# Patient Record
Sex: Female | Born: 1935
Health system: Southern US, Community
[De-identification: ages and names within clinical notes are randomized; demographics above are authoritative.]

## PROBLEM LIST (undated history)

## (undated) DIAGNOSIS — I4719 Other supraventricular tachycardia: Secondary | ICD-10-CM

## (undated) DIAGNOSIS — E049 Nontoxic goiter, unspecified: Secondary | ICD-10-CM

## (undated) DIAGNOSIS — M316 Other giant cell arteritis: Secondary | ICD-10-CM

## (undated) DIAGNOSIS — M1990 Unspecified osteoarthritis, unspecified site: Secondary | ICD-10-CM

## (undated) DIAGNOSIS — H409 Unspecified glaucoma: Secondary | ICD-10-CM

## (undated) DIAGNOSIS — E785 Hyperlipidemia, unspecified: Secondary | ICD-10-CM

## (undated) DIAGNOSIS — N3281 Overactive bladder: Secondary | ICD-10-CM

## (undated) DIAGNOSIS — M792 Neuralgia and neuritis, unspecified: Secondary | ICD-10-CM

## (undated) DIAGNOSIS — K219 Gastro-esophageal reflux disease without esophagitis: Secondary | ICD-10-CM

## (undated) DIAGNOSIS — I471 Supraventricular tachycardia: Secondary | ICD-10-CM

## (undated) DIAGNOSIS — R51 Headache: Secondary | ICD-10-CM

## (undated) DIAGNOSIS — I719 Aortic aneurysm of unspecified site, without rupture: Secondary | ICD-10-CM

## (undated) DIAGNOSIS — G2581 Restless legs syndrome: Secondary | ICD-10-CM

## (undated) HISTORY — PX: OTHER SURGICAL HISTORY: SHX169

## (undated) HISTORY — DX: Hyperlipidemia, unspecified: E78.5

## (undated) HISTORY — DX: Neuralgia and neuritis, unspecified: M79.2

## (undated) HISTORY — DX: Restless legs syndrome: G25.81

## (undated) HISTORY — DX: Aortic aneurysm of unspecified site, without rupture: I71.9

## (undated) HISTORY — DX: Other giant cell arteritis: M31.6

## (undated) HISTORY — PX: EYE SURGERY: SHX253

## (undated) HISTORY — PX: INCONTINENCE SURGERY: SHX676

## (undated) HISTORY — PX: JOINT REPLACEMENT: SHX530

## (undated) HISTORY — DX: Unspecified glaucoma: H40.9

---

## 1997-10-19 ENCOUNTER — Other Ambulatory Visit: Admission: RE | Admit: 1997-10-19 | Discharge: 1997-10-19 | Payer: Self-pay | Admitting: Obstetrics & Gynecology

## 1998-07-04 ENCOUNTER — Ambulatory Visit (HOSPITAL_COMMUNITY): Admission: AD | Admit: 1998-07-04 | Discharge: 1998-07-04 | Payer: Self-pay | Admitting: Obstetrics & Gynecology

## 1998-12-12 ENCOUNTER — Other Ambulatory Visit: Admission: RE | Admit: 1998-12-12 | Discharge: 1998-12-12 | Payer: Self-pay | Admitting: Obstetrics and Gynecology

## 1999-01-10 ENCOUNTER — Encounter: Payer: Self-pay | Admitting: Obstetrics and Gynecology

## 1999-01-10 ENCOUNTER — Encounter: Admission: RE | Admit: 1999-01-10 | Discharge: 1999-01-10 | Payer: Self-pay | Admitting: Obstetrics and Gynecology

## 1999-09-15 ENCOUNTER — Encounter: Admission: RE | Admit: 1999-09-15 | Discharge: 1999-09-15 | Payer: Self-pay | Admitting: Orthopedic Surgery

## 1999-09-15 ENCOUNTER — Encounter: Payer: Self-pay | Admitting: Orthopedic Surgery

## 1999-12-14 ENCOUNTER — Other Ambulatory Visit: Admission: RE | Admit: 1999-12-14 | Discharge: 1999-12-14 | Payer: Self-pay | Admitting: Obstetrics and Gynecology

## 1999-12-19 ENCOUNTER — Encounter: Payer: Self-pay | Admitting: Obstetrics and Gynecology

## 1999-12-19 ENCOUNTER — Encounter: Admission: RE | Admit: 1999-12-19 | Discharge: 1999-12-19 | Payer: Self-pay | Admitting: Obstetrics and Gynecology

## 2000-03-13 ENCOUNTER — Encounter: Payer: Self-pay | Admitting: Obstetrics and Gynecology

## 2000-03-13 ENCOUNTER — Encounter: Admission: RE | Admit: 2000-03-13 | Discharge: 2000-03-13 | Payer: Self-pay | Admitting: Obstetrics and Gynecology

## 2000-10-09 ENCOUNTER — Encounter (INDEPENDENT_AMBULATORY_CARE_PROVIDER_SITE_OTHER): Payer: Self-pay | Admitting: *Deleted

## 2000-10-09 ENCOUNTER — Ambulatory Visit (HOSPITAL_COMMUNITY): Admission: RE | Admit: 2000-10-09 | Discharge: 2000-10-09 | Payer: Self-pay | Admitting: Gastroenterology

## 2000-12-18 ENCOUNTER — Other Ambulatory Visit: Admission: RE | Admit: 2000-12-18 | Discharge: 2000-12-18 | Payer: Self-pay | Admitting: Obstetrics and Gynecology

## 2001-01-01 ENCOUNTER — Encounter: Payer: Self-pay | Admitting: Obstetrics and Gynecology

## 2001-01-01 ENCOUNTER — Encounter: Admission: RE | Admit: 2001-01-01 | Discharge: 2001-01-01 | Payer: Self-pay | Admitting: Obstetrics and Gynecology

## 2001-04-15 ENCOUNTER — Encounter: Payer: Self-pay | Admitting: Obstetrics and Gynecology

## 2001-04-15 ENCOUNTER — Encounter: Admission: RE | Admit: 2001-04-15 | Discharge: 2001-04-15 | Payer: Self-pay | Admitting: Obstetrics and Gynecology

## 2001-12-22 ENCOUNTER — Other Ambulatory Visit: Admission: RE | Admit: 2001-12-22 | Discharge: 2001-12-22 | Payer: Self-pay | Admitting: Obstetrics and Gynecology

## 2002-01-02 ENCOUNTER — Encounter: Admission: RE | Admit: 2002-01-02 | Discharge: 2002-01-02 | Payer: Self-pay | Admitting: Obstetrics and Gynecology

## 2002-01-02 ENCOUNTER — Encounter: Payer: Self-pay | Admitting: Obstetrics and Gynecology

## 2002-05-07 ENCOUNTER — Encounter: Payer: Self-pay | Admitting: Obstetrics and Gynecology

## 2002-05-07 ENCOUNTER — Encounter: Admission: RE | Admit: 2002-05-07 | Discharge: 2002-05-07 | Payer: Self-pay | Admitting: Obstetrics and Gynecology

## 2002-08-20 ENCOUNTER — Encounter: Admission: RE | Admit: 2002-08-20 | Discharge: 2002-08-20 | Payer: Self-pay | Admitting: Obstetrics and Gynecology

## 2002-08-20 ENCOUNTER — Encounter: Payer: Self-pay | Admitting: Obstetrics and Gynecology

## 2003-02-19 ENCOUNTER — Other Ambulatory Visit: Admission: RE | Admit: 2003-02-19 | Discharge: 2003-02-19 | Payer: Self-pay | Admitting: Obstetrics and Gynecology

## 2003-05-12 ENCOUNTER — Encounter: Admission: RE | Admit: 2003-05-12 | Discharge: 2003-05-12 | Payer: Self-pay | Admitting: Obstetrics and Gynecology

## 2004-02-21 ENCOUNTER — Other Ambulatory Visit: Admission: RE | Admit: 2004-02-21 | Discharge: 2004-02-21 | Payer: Self-pay | Admitting: Obstetrics and Gynecology

## 2004-05-15 ENCOUNTER — Encounter: Admission: RE | Admit: 2004-05-15 | Discharge: 2004-05-15 | Payer: Self-pay | Admitting: Obstetrics and Gynecology

## 2004-08-07 ENCOUNTER — Observation Stay (HOSPITAL_COMMUNITY): Admission: RE | Admit: 2004-08-07 | Discharge: 2004-08-07 | Payer: Self-pay | Admitting: Orthopedic Surgery

## 2004-12-25 ENCOUNTER — Encounter: Admission: RE | Admit: 2004-12-25 | Discharge: 2004-12-25 | Payer: Self-pay | Admitting: Obstetrics and Gynecology

## 2005-05-21 ENCOUNTER — Encounter: Admission: RE | Admit: 2005-05-21 | Discharge: 2005-05-21 | Payer: Self-pay | Admitting: Obstetrics and Gynecology

## 2005-09-11 ENCOUNTER — Other Ambulatory Visit: Admission: RE | Admit: 2005-09-11 | Discharge: 2005-09-11 | Payer: Self-pay | Admitting: Obstetrics and Gynecology

## 2005-09-19 ENCOUNTER — Encounter: Admission: RE | Admit: 2005-09-19 | Discharge: 2005-09-19 | Payer: Self-pay | Admitting: Obstetrics and Gynecology

## 2006-05-17 ENCOUNTER — Ambulatory Visit (HOSPITAL_COMMUNITY): Admission: RE | Admit: 2006-05-17 | Discharge: 2006-05-17 | Payer: Self-pay | Admitting: *Deleted

## 2006-06-06 ENCOUNTER — Encounter: Admission: RE | Admit: 2006-06-06 | Discharge: 2006-06-06 | Payer: Self-pay | Admitting: Obstetrics and Gynecology

## 2006-10-02 ENCOUNTER — Other Ambulatory Visit: Admission: RE | Admit: 2006-10-02 | Discharge: 2006-10-02 | Payer: Self-pay | Admitting: Obstetrics and Gynecology

## 2006-10-21 ENCOUNTER — Encounter: Admission: RE | Admit: 2006-10-21 | Discharge: 2006-10-21 | Payer: Self-pay | Admitting: Obstetrics and Gynecology

## 2007-06-02 ENCOUNTER — Encounter: Admission: RE | Admit: 2007-06-02 | Discharge: 2007-06-02 | Payer: Self-pay | Admitting: Obstetrics and Gynecology

## 2007-11-17 ENCOUNTER — Other Ambulatory Visit: Admission: RE | Admit: 2007-11-17 | Discharge: 2007-11-17 | Payer: Self-pay | Admitting: Obstetrics and Gynecology

## 2007-11-25 ENCOUNTER — Encounter: Admission: RE | Admit: 2007-11-25 | Discharge: 2007-11-25 | Payer: Self-pay | Admitting: Obstetrics and Gynecology

## 2008-09-30 ENCOUNTER — Ambulatory Visit (HOSPITAL_COMMUNITY): Admission: RE | Admit: 2008-09-30 | Discharge: 2008-09-30 | Payer: Self-pay | Admitting: Obstetrics and Gynecology

## 2009-06-23 ENCOUNTER — Encounter: Admission: RE | Admit: 2009-06-23 | Discharge: 2009-06-23 | Payer: Self-pay | Admitting: Gynecology

## 2009-10-19 ENCOUNTER — Ambulatory Visit (HOSPITAL_BASED_OUTPATIENT_CLINIC_OR_DEPARTMENT_OTHER): Admission: RE | Admit: 2009-10-19 | Discharge: 2009-10-19 | Payer: Self-pay | Admitting: Orthopedic Surgery

## 2010-02-12 HISTORY — PX: CERVICAL FUSION: SHX112

## 2010-04-27 LAB — POCT HEMOGLOBIN-HEMACUE: Hemoglobin: 14.9 g/dL (ref 12.0–15.0)

## 2010-05-02 ENCOUNTER — Emergency Department (HOSPITAL_COMMUNITY)
Admission: EM | Admit: 2010-05-02 | Discharge: 2010-05-02 | Disposition: A | Discharge status: Home / Self Care | Payer: Medicare Other | Attending: Emergency Medicine | Admitting: Emergency Medicine

## 2010-05-02 DIAGNOSIS — M79609 Pain in unspecified limb: Secondary | ICD-10-CM | POA: Insufficient documentation

## 2010-05-02 DIAGNOSIS — R5381 Other malaise: Secondary | ICD-10-CM | POA: Insufficient documentation

## 2010-05-02 DIAGNOSIS — M542 Cervicalgia: Secondary | ICD-10-CM | POA: Insufficient documentation

## 2010-05-02 DIAGNOSIS — R079 Chest pain, unspecified: Secondary | ICD-10-CM | POA: Insufficient documentation

## 2010-05-02 DIAGNOSIS — R5383 Other fatigue: Secondary | ICD-10-CM | POA: Insufficient documentation

## 2010-05-02 LAB — CBC
HCT: 40.6 % (ref 36.0–46.0)
Hemoglobin: 13.6 g/dL (ref 12.0–15.0)
MCH: 31.6 pg (ref 26.0–34.0)
MCHC: 33.5 g/dL (ref 30.0–36.0)
MCV: 94.4 fL (ref 78.0–100.0)
Platelets: 194 10*3/uL (ref 150–400)
RBC: 4.3 MIL/uL (ref 3.87–5.11)
RDW: 12.8 % (ref 11.5–15.5)
WBC: 5.6 10*3/uL (ref 4.0–10.5)

## 2010-05-02 LAB — DIFFERENTIAL
Basophils Absolute: 0 10*3/uL (ref 0.0–0.1)
Basophils Relative: 1 % (ref 0–1)
Eosinophils Absolute: 0.2 10*3/uL (ref 0.0–0.7)
Eosinophils Relative: 4 % (ref 0–5)
Lymphocytes Relative: 22 % (ref 12–46)
Lymphs Abs: 1.2 10*3/uL (ref 0.7–4.0)
Monocytes Absolute: 0.4 10*3/uL (ref 0.1–1.0)
Monocytes Relative: 7 % (ref 3–12)
Neutro Abs: 3.7 10*3/uL (ref 1.7–7.7)
Neutrophils Relative %: 67 % (ref 43–77)

## 2010-05-02 LAB — POCT I-STAT, CHEM 8
BUN: 22 mg/dL (ref 6–23)
Calcium, Ion: 1.15 mmol/L (ref 1.12–1.32)
Chloride: 103 mEq/L (ref 96–112)
Creatinine, Ser: 1 mg/dL (ref 0.4–1.2)
Glucose, Bld: 116 mg/dL — ABNORMAL HIGH (ref 70–99)
HCT: 40 % (ref 36.0–46.0)
Hemoglobin: 13.6 g/dL (ref 12.0–15.0)
Potassium: 4 mEq/L (ref 3.5–5.1)
Sodium: 142 mEq/L (ref 135–145)
TCO2: 28 mmol/L (ref 0–100)

## 2010-05-02 LAB — POCT CARDIAC MARKERS
CKMB, poc: <1 ng/mL — ABNORMAL LOW (ref 1.0–8.0)
Myoglobin, poc: 56 ng/mL (ref 12–200)
Troponin i, poc: <0.05 ng/mL (ref 0.00–0.09)

## 2010-05-20 LAB — URINALYSIS, ROUTINE W REFLEX MICROSCOPIC
Bilirubin Urine: NEGATIVE
Glucose, UA: NEGATIVE mg/dL
Hgb urine dipstick: NEGATIVE
Ketones, ur: NEGATIVE mg/dL
Nitrite: NEGATIVE
Protein, ur: NEGATIVE mg/dL
Specific Gravity, Urine: 1.025 (ref 1.005–1.030)
Urobilinogen, UA: 0.2 mg/dL (ref 0.0–1.0)
pH: 6 (ref 5.0–8.0)

## 2010-05-20 LAB — COMPREHENSIVE METABOLIC PANEL
ALT: 18 U/L (ref 0–35)
AST: 20 U/L (ref 0–37)
Albumin: 3.8 g/dL (ref 3.5–5.2)
Alkaline Phosphatase: 64 U/L (ref 39–117)
BUN: 21 mg/dL (ref 6–23)
CO2: 30 mEq/L (ref 19–32)
Calcium: 9.2 mg/dL (ref 8.4–10.5)
Chloride: 107 mEq/L (ref 96–112)
Creatinine, Ser: 0.71 mg/dL (ref 0.4–1.2)
GFR calc Af Amer: >60 mL/min (ref >60)
GFR calc non Af Amer: >60 mL/min (ref >60)
Glucose, Bld: 54 mg/dL — ABNORMAL LOW (ref 70–99)
Potassium: 4.2 mEq/L (ref 3.5–5.1)
Sodium: 142 mEq/L (ref 135–145)
Total Bilirubin: 1.1 mg/dL (ref 0.3–1.2)
Total Protein: 6.8 g/dL (ref 6.0–8.3)

## 2010-05-20 LAB — CBC
HCT: 43.8 % (ref 36.0–46.0)
Hemoglobin: 14.9 g/dL (ref 12.0–15.0)
MCHC: 34 g/dL (ref 30.0–36.0)
MCV: 96.3 fL (ref 78.0–100.0)
Platelets: 165 10*3/uL (ref 150–400)
RBC: 4.55 MIL/uL (ref 3.87–5.11)
RDW: 13 % (ref 11.5–15.5)
WBC: 4.9 10*3/uL (ref 4.0–10.5)

## 2010-06-04 ENCOUNTER — Emergency Department (HOSPITAL_COMMUNITY): Payer: No Typology Code available for payment source

## 2010-06-04 ENCOUNTER — Emergency Department (HOSPITAL_COMMUNITY)
Admission: EM | Admit: 2010-06-04 | Discharge: 2010-06-04 | Disposition: A | Discharge status: Home / Self Care | Payer: No Typology Code available for payment source | Attending: Emergency Medicine | Admitting: Emergency Medicine

## 2010-06-04 DIAGNOSIS — S63279A Dislocation of unspecified interphalangeal joint of unspecified finger, initial encounter: Secondary | ICD-10-CM | POA: Insufficient documentation

## 2010-06-04 DIAGNOSIS — W010XXA Fall on same level from slipping, tripping and stumbling without subsequent striking against object, initial encounter: Secondary | ICD-10-CM | POA: Insufficient documentation

## 2010-06-04 DIAGNOSIS — Y9229 Other specified public building as the place of occurrence of the external cause: Secondary | ICD-10-CM | POA: Insufficient documentation

## 2010-06-04 DIAGNOSIS — IMO0002 Reserved for concepts with insufficient information to code with codable children: Secondary | ICD-10-CM | POA: Insufficient documentation

## 2010-06-04 DIAGNOSIS — H409 Unspecified glaucoma: Secondary | ICD-10-CM | POA: Insufficient documentation

## 2010-06-13 ENCOUNTER — Other Ambulatory Visit: Payer: Self-pay | Admitting: Neurosurgery

## 2010-06-13 DIAGNOSIS — M542 Cervicalgia: Secondary | ICD-10-CM

## 2010-06-16 ENCOUNTER — Other Ambulatory Visit: Payer: Self-pay | Admitting: Gynecology

## 2010-06-16 DIAGNOSIS — Z1231 Encounter for screening mammogram for malignant neoplasm of breast: Secondary | ICD-10-CM

## 2010-06-17 ENCOUNTER — Ambulatory Visit
Admission: RE | Admit: 2010-06-17 | Discharge: 2010-06-17 | Disposition: A | Discharge status: Home / Self Care | Payer: No Typology Code available for payment source | Source: Ambulatory Visit | Attending: Neurosurgery | Admitting: Neurosurgery

## 2010-06-17 DIAGNOSIS — M542 Cervicalgia: Secondary | ICD-10-CM

## 2010-06-27 NOTE — H&P (Signed)
NAME:  Janet Wilson, Janet Wilson          ACCOUNT NO.:  [id number]   MEDICAL RECORD NO.:  [id number]          PATIENT TYPE:  AMB   LOCATION:  SDC                           FACILITY:  [facility]   PHYSICIAN:  Guy Sandifer. Henderson Cloud, M.D. DATE OF BIRTH:  Feb 26, 1934   DATE OF ADMISSION:  DATE OF DISCHARGE:                              HISTORY & PHYSICAL   CHIEF COMPLAINT:  Leaking urine.   HISTORY OF PRESENT ILLNESS:  The patient is a 76 year old married white  female G2, P1, who leaks urine with coughing, sneezing, jumping, and  bending over.  It is becoming an impediment to her lifestyle.  She wears  a pantiliner all day.  She occasionally gets up at night to go to the  bathroom.  Urodynamic studies were consistent with genuine stress  urinary incontinence.  After discussion of options, she is being  admitted for a mid-urethral sling.  Potential risks and complications,  success and failure rate of the procedure have been reviewed  preoperatively.   PAST MEDICAL HISTORY:  1. Migraine headaches.  2. Arthritis.   MEDICATIONS:  Zomig p.r.n., verapamil, and clonazepam.   ALLERGIES:  No drug allergies.  She is allergic to MOLD.   PAST SURGICAL HISTORY:  History of left rotator cuff surgery 3 years  ago.   OBSTETRICAL HISTORY:  One vaginal delivery.   SOCIAL HISTORY:  Denies tobacco, alcohol, or drug abuse.   REVIEW OF SYSTEMS:  NEURO:  Migraine headache as above.  CARDIAC:  No  chest pain.  PULMONARY:  Denies shortness of breath.   PHYSICAL EXAMINATION:  VITAL SIGNS:  Height 5 feet 3-3/4 inches, weight  128 pounds, and blood pressure 110/62.  LUNGS:  Clear to auscultation.  CARDIAC:  Regular rate and rhythm.  ABDOMEN:  Soft, nontender without masses.  PELVIC:  Well vagina and cervix without lesion.  Uterus is well  supported.  Adnexa nontender without masses.  RECTAL:  Good sphincter tone and adequate rectovaginal septum.  EXTREMITIES:  Grossly within normal limits.  NEUROLOGIC:  Grossly  within normal limits.   ASSESSMENT:  Genuine stress urinary incontinence.   PLAN:  Mid-urethral sling.      Guy Sandifer Henderson Cloud, M.D.  Electronically Signed     JET/MEDQ  D:  09/27/2008  T:  09/28/2008  Job:  [id number]

## 2010-06-27 NOTE — H&P (Signed)
NAME:  Janet Wilson, Janet Wilson          ACCOUNT NO.:  [id number]   MEDICAL RECORD NO.:  [id number]          PATIENT TYPE:  AMB   LOCATION:  SDC                           FACILITY:  [facility]   PHYSICIAN:  Guy Sandifer. Henderson Cloud, M.D. DATE OF BIRTH:  1934/11/16   DATE OF ADMISSION:  DATE OF DISCHARGE:                              HISTORY & PHYSICAL   CHIEF COMPLAINT:  Stress incontinence.   HISTORY OF PRESENT ILLNESS:  This patient is a 76 year old married white  female, G2, P1, who is postmenopausal.  She complains of repeatedly  leaking urine with coughing, sneezing, jumping, and bending over.  She  has to wear a pad everyday.  Urodynamics is consistent with stress  incontinence.  After discussion of options, she is being admitted for a  mid urethral sling.  Potential risks and complications have been  discussed preoperatively.   PAST MEDICAL HISTORY:  1. Headache.  2. Arthritis.  3. History of UTIs.   PAST SURGICAL HISTORY:  Left rotator cuff surgery 3 years ago.   OBSTETRICAL HISTORY:  Vaginal delivery x1.   SOCIAL HISTORY:  Denies tobacco, alcohol, or drug abuse.   MEDICATIONS:  Zomig, verapamil, and clonazepam.   ALLERGIES:  No known drug allergies.  She does have a MOLD allergy.   FAMILY HISTORY:  Positive for heart disease.   REVIEW OF SYSTEMS:  NEURO:  Denies headache.  CARDIAC:  Denies chest  pain.  PULMONARY:  Denies shortness of breath.   PHYSICAL EXAMINATION:  VITAL SIGNS:  Height 5 feet 4-3/4 inches, weight  126 pounds, and blood pressure 116/78.  LUNGS:  Clear to auscultation.  HEART:  Regular rate and rhythm.  ABDOMEN:  Soft, nontender without masses.  PELVIC:  Vulva, vagina, and cervix without lesion.  Uterus is normal  size, mobile, nontender.  Good support of the uterus.  Adnexa nontender  without masses.  EXTREMITIES:  Grossly within normal limits.  NEUROLOGIC:  Grossly within normal limits.   ASSESSMENT:  Genuine stress urinary continence.   PLAN:  Mid urethral  sling.      Guy Sandifer Henderson Cloud, M.D.  Electronically Signed     JET/MEDQ  D:  09/20/2008  T:  09/21/2008  Job:  [id number]

## 2010-06-27 NOTE — Op Note (Signed)
NAME:  Janet Wilson, Janet Wilson          ACCOUNT NO.:  [id number]   MEDICAL RECORD NO.:  [id number]          PATIENT TYPE:  AMB   LOCATION:  SDC                           FACILITY:  [facility]   PHYSICIAN:  Guy Sandifer. Henderson Cloud, M.D. DATE OF BIRTH:  Dec 08, 1934   DATE OF PROCEDURE:  09/30/2008  DATE OF DISCHARGE:                               OPERATIVE REPORT   PREOPERATIVE DIAGNOSIS:  Stress urinary continence.   POSTOPERATIVE DIAGNOSIS:  Stress urinary continence.   PROCEDURE:  Single incision sling (Solyx).   SURGEON:  Guy Sandifer. Henderson Cloud, MD   ANESTHESIA:  General with LMA.   ESTIMATED BLOOD LOSS:  Minimal.   SPECIMENS:  None.   INDICATIONS AND CONSENT:  This patient is a 76 year old married white  female with genuine stress urinary continence.  Details are dictated in  the history and physical.  Mid urethral sling has been discussed with  the patient.  Potential risks and complications have been discussed  preoperatively including, but not limited to infection, organ damage,  bleeding requiring transfusion of blood products with HIV and hepatitis  acquisition, DVT, PE, and pneumonia.  Success and failure rate of the  sling, prolonged catheterization, self-catheterization, return to the  operating room, inability to void, postoperative irritative voiding  symptoms, pelvic pain, dyspareunia, erosion, and delayed healing have  been reviewed with the patient.  All questions have been answered and  consent is signed on the chart.   PROCEDURE:  The patient was taken to the operating room, where she was  identified, placed in dorsal supine position and general anesthesia was  induced via LMA.  She was then placed in dorsal lithotomy position.  She  is prepped and draped in sterile fashion.  Time-out was undertaken.  Posterior retractor was placed.  The intraurethral area of the vaginal  mucosa is injected with 0.5% Marcaine with 1:200,000 epinephrine.  A  linear incision was made below the course  of the urethra.  Foley  catheter was placed and left in place.  Dissection with the scissors  were carried out bilaterally to the urogenital diaphragm.  After  carefully palpating the obturator fossa to determine the direction of  placement, the Solyx single incision sling was placed first on the  patient left with the middle of the sling being marked.  The right arm  was then placed.  Proper tensioning is noted.  The applicator is  removed.  Foley catheter was removed.  Cystoscopy was then carried out  with 70-degree cystoscope.  A 360-degree inspection reveals no evidence  of uterine perforation or foreign body.  Good puff of indigo carmine was  noted from the ureters  bilaterally.  The cystoscope was removed.  Foley catheter was replaced.  Inspection reveals the sling to be flat with no kinks or rolls.  The  vaginal mucosa is closed in running locking fashion with 0-Vicryl  suture.  Procedure was terminated.  All counts were correct.  The  patient is awakened and taken to recovery room in stable condition.      Guy Sandifer Henderson Cloud, M.D.  Electronically Signed     JET/MEDQ  D:  09/30/2008  T:  09/30/2008  Job:  [id number]

## 2010-06-28 ENCOUNTER — Encounter (HOSPITAL_COMMUNITY)
Admission: RE | Admit: 2010-06-28 | Discharge: 2010-06-28 | Disposition: A | Discharge status: Home / Self Care | Payer: Medicare Other | Source: Ambulatory Visit | Attending: Neurosurgery | Admitting: Neurosurgery

## 2010-06-28 ENCOUNTER — Ambulatory Visit
Admission: RE | Admit: 2010-06-28 | Discharge: 2010-06-28 | Disposition: A | Discharge status: Home / Self Care | Payer: Medicare Other | Source: Ambulatory Visit | Attending: Gynecology | Admitting: Gynecology

## 2010-06-28 DIAGNOSIS — Z1231 Encounter for screening mammogram for malignant neoplasm of breast: Secondary | ICD-10-CM

## 2010-06-28 LAB — BASIC METABOLIC PANEL
BUN: 21 mg/dL (ref 6–23)
CO2: 32 mEq/L (ref 19–32)
Calcium: 9.8 mg/dL (ref 8.4–10.5)
Chloride: 103 mEq/L (ref 96–112)
Creatinine, Ser: 0.71 mg/dL (ref 0.4–1.2)
GFR calc Af Amer: >60 mL/min (ref >60)
GFR calc non Af Amer: >60 mL/min (ref >60)
Glucose, Bld: 83 mg/dL (ref 70–99)
Potassium: 4.2 mEq/L (ref 3.5–5.1)
Sodium: 141 mEq/L (ref 135–145)

## 2010-06-28 LAB — CBC
HCT: 42.5 % (ref 36.0–46.0)
Hemoglobin: 14.1 g/dL (ref 12.0–15.0)
MCH: 30.8 pg (ref 26.0–34.0)
MCHC: 33.2 g/dL (ref 30.0–36.0)
MCV: 92.8 fL (ref 78.0–100.0)
Platelets: 179 10*3/uL (ref 150–400)
RBC: 4.58 MIL/uL (ref 3.87–5.11)
RDW: 12.5 % (ref 11.5–15.5)
WBC: 5.3 10*3/uL (ref 4.0–10.5)

## 2010-06-28 LAB — SURGICAL PCR SCREEN
MRSA, PCR: NEGATIVE
Staphylococcus aureus: NEGATIVE

## 2010-06-30 NOTE — Procedures (Signed)
[facility]  Patient:    Janet Wilson, Janet Wilson Visit Number: [id number] MRN: [id number]          Service Type: Attending:  Anselmo Rod, M.D. Proc. Date: 10/09/00   CC:         Pearla Dubonnet, M.D.   Procedure Report  DATE OF BIRTH:  Sep 29, 1934  REFERRING PHYSICIAN:  Pearla Dubonnet, M.D.  PROCEDURE PERFORMED:  Colonoscopy with biopsies.  ENDOSCOPIST:  Anselmo Rod, M.D.  INSTRUMENT USED:  Olympus video colonoscope (pediatric).  INDICATIONS FOR PROCEDURE:  Rectal bleeding and a history of chronic constipation in a 76 year old white female.  Rule out colonic polyps, masses, hemorrhoids, etc.  PREPROCEDURE PREPARATION:  Informed consent was procured from the patient. The patient was fasted for eight hours prior to the procedure and prepped with a bottle of magnesium citrate and a gallon of NuLytely the night prior to the procedure.  PREPROCEDURE PHYSICAL:  The patient had stable vital signs.  Neck supple. Chest clear to auscultation.  S1, S2 regular.  Abdomen soft with normal abdominal bowel sounds.  DESCRIPTION OF PROCEDURE:  The patient was placed in the left lateral decubitus position and sedated with 50 mg of Demerol and 5 mg of Versed intravenously.  Once the patient was adequately sedated and maintained on low-flow oxygen and continuous cardiac monitoring, the Olympus video colonoscope was advanced from the rectum to the cecum with slight difficulty secondary to some residual stool in the colon.  The patient also had a very tortuous colon.  There was evidence of melanosis coli throughout the colon with more prominent changes in the right colon.  A small sessile polyp was removed from the cecal base by cold biopsy forceps.  Small internal hemorrhoids were appreciated on retroflexion.  The patient tolerated the procedure well without complications.  No masses or polyps were seen.  There was no evidence of  diverticulosis.  IMPRESSION: 1. Small internal hemorrhoids. 2. Significant melanosis coli with more prominent changes in the right    colon compared to the left colon. 3. Small sessile polyp removed from cecum by cold biopsy forceps.  RECOMMENDATIONS: 1. A high fiber diet has been recommended for the patient. 2. Await pathology results. 3. Outpatient follow-up in the next four weeks for further recommendations.Attending:  Anselmo Rod, M.D. DD:  10/09/00 TD:  10/09/00 Job: [id number]

## 2010-06-30 NOTE — Op Note (Signed)
NAME:  Janet Wilson, Janet Wilson          ACCOUNT NO.:  [id number]   MEDICAL RECORD NO.:  [id number]          PATIENT TYPE:  OBV   LOCATION:  1514                         FACILITY:  [facility]   PHYSICIAN:  Marlowe Kays, M.D.  DATE OF BIRTH:  [birth year]   DATE OF PROCEDURE:  DATE OF DISCHARGE:                                 OPERATIVE REPORT   PREOPERATIVE DIAGNOSES:  Chronic impingement syndrome, left shoulder with  rotator cuff tendinopathy, possible labral tear, and complete tear of  (chronic) of long head biceps tendon.   POSTOPERATIVE DIAGNOSIS:  Chronic impingement syndrome with rotator cuff  tendinopathy, minor labral disruption, and chronic tear of long head biceps  tendon, left shoulder.   OPERATION:  Left shoulder arthroscopy with:  1.  Debridement of labrum, stump of biceps tendon, and synovium and the      humeral head.  2.  Arthroscopic subacromial decompression.   SURGEON:  Marlowe Kays, M.D.   ASSISTANTDruscilla Brownie. Idolina Primer, P.A.-C.   ANESTHESIA:  General.   __________procedure.  She actually has problems with both shoulders with the  MRI demonstrating the findings listed under preoperative diagnosis.  Before  hand, it was discussed with her that we would not try and repair the long  head of the biceps tendon because it was old, and because of her age of 32  unless we had to do an open labral repair and we were right there.  As it turns out, everything was able to be handled arthroscopically.   PROCEDURE:  Satisfactory general anesthesia preceded by interscalene block.  Left shoulder Schlein frame.  Left shoulder girdle was prepped with  DuraPrep, draped in sterile field.  Anatomy of the shoulder joint was marked  out, and subacromial space, lateral and posterior portals infiltrated with  0.5% Marcaine with Adrenaline.  Through a posterior soft spot portal, I was  able to enter the glenohumeral joint with findings of the remnant of the  long head of the biceps  tendon stump, some minor labral disruption  anteriorly, but not posteriorly; and some synovitis.  There was also some  minor wear of the humeral head.  I advanced the scope above the  subscapularis into the anterior joint using switching stick.  We made an  anterior incision.  Over this I placed a metal cannula followed by 4.2  shaver, and debrided up the entire joint including the humeral head, the  remaining stump of the biceps tendon, and the labrum.  I then evacuated all  fluid possible from the joint, and redirected the scope in subacromial  space.  Through the lateral port hole, I introduced a 4.2 shaver.  She had  very significant bursitis making initial visualization very difficult.  I  was able to gradually obtain visualization with a 4.2 shaver, and followed  this with the ArthroCare 90-degree vaporizer removing soft tissue from the  underneath surface of the acromion and back beneath the underneath surface  of the clavicle.  Filed this with a 4.0 oval bur removing bone from the  underneath surface of the distal clavicle and acromion and we went back and  forth between  the bur, the vaporizer, and the 4.2 shaver until I had  completed the decompression.  We documented this with pictures with her arm  to her side and her arm abducted.  I then evacuated all fluid possible.  The  three portals and the subacromial space were once infiltrated with 0.5%  Marcaine with  adrenaline, and the three portals closed with 4-0 Nylon.  Betadine, Adaptic,  dry sterile dressing applied followed by shoulder immobilizer.  She  tolerated the procedure well and was taken to the recovery room in  satisfactory condition with no complications.       JA/MEDQ  D:  08/07/2004  T:  08/07/2004  Job:  [id number]

## 2010-07-04 ENCOUNTER — Inpatient Hospital Stay (HOSPITAL_COMMUNITY): Payer: Medicare Other

## 2010-07-04 ENCOUNTER — Ambulatory Visit (HOSPITAL_COMMUNITY)
Admission: RE | Admit: 2010-07-04 | Discharge: 2010-07-05 | Disposition: A | Discharge status: Home / Self Care | Payer: Medicare Other | Source: Ambulatory Visit | Attending: Neurosurgery | Admitting: Neurosurgery

## 2010-07-04 DIAGNOSIS — M5020 Other cervical disc displacement, unspecified cervical region: Principal | ICD-10-CM | POA: Insufficient documentation

## 2010-07-04 DIAGNOSIS — M47812 Spondylosis without myelopathy or radiculopathy, cervical region: Secondary | ICD-10-CM | POA: Insufficient documentation

## 2010-07-04 DIAGNOSIS — M5030 Other cervical disc degeneration, unspecified cervical region: Secondary | ICD-10-CM | POA: Insufficient documentation

## 2010-07-04 DIAGNOSIS — Z01812 Encounter for preprocedural laboratory examination: Secondary | ICD-10-CM | POA: Insufficient documentation

## 2010-07-07 NOTE — Op Note (Signed)
NAME:  Janet Wilson, Janet Wilson          ACCOUNT NO.:  [id number]  MEDICAL RECORD NO.:  [id number]           PATIENT TYPE:  I  LOCATION:  3526                         FACILITY:  [facility]  PHYSICIAN:  Danae Orleans. Venetia Maxon, M.D.  DATE OF BIRTH:  1934-07-29  DATE OF PROCEDURE:  07/04/2010 DATE OF DISCHARGE:                              OPERATIVE REPORT   PREOPERATIVE DIAGNOSES:  Cervical spondylosis with herniated cervical disk, degenerative disk disease and radiculopathy C4-5, C5-6 and C6-7 levels.  POSTOPERATIVE DIAGNOSES:  Cervical spondylosis with herniated cervical disk, degenerative disk disease and radiculopathy C4-5, C5-6 and C6-7 levels.  PROCEDURE:  Anterior cervical decompression and fusion C4-5, C5-6 and C6- 7 levels with PEEK interbody cages, morselized bone autograft, PureGen and profuse with anterior cervical plate.  SURGEON:  Danae Orleans. Venetia Maxon, MD  ASSISTANT:  Clydene Fake, MD  ANESTHESIA:  General endotracheal anesthesia. ESTIMATED BLOOD LOSS:  200 mL  COMPLICATIONS:  None.  DISPOSITION:  Recovery.  INDICATIONS:  Janet Wilson is a 76 year old woman with multilevel cervical spondylosis and significant foraminal stenosis with left arm pain and weakness.  It was elected to perform anterior cervical decompression and fusion C4-5, C5-6 and C6-7 levels.  PROCEDURE:  Janet Wilson was brought to the operating room.  Following satisfactory and uncomplicated induction of general endotracheal anesthesia and placement of intravenous lines, the patient was placed in supine position on the horseshoe head holder in slight extension.  She was placed in 5 pounds of traction.  Her anterior neck was then prepped and draped in usual sterile fashion.  Area of planned incision was infiltrated with local lidocaine.  Incision was made from the left side of midline carried through platysma layer.  Subplatysmal dissection was performed exposing anterior border of sternocleidomastoid  muscle.  Using blunt dissection, the carotid sheath was kept lateral and trachea and esophagus kept medial exposing the anterior cervical spine.  Bent spinal needles were placed where it was felt to be the C4-5 and C5-6 levels and this was confirmed on intraoperative x-rays.  Subsequently, longus colli muscles were taken down from the anterior cervical spine at C4 to C7 levels using electrocautery and Key elevator.  Using of shadow line retractor throughout the case, the each level was sequentially exposed and decompressed, interspaces were incised.  Disk material was removed in piecemeal fashion.  Distraction pins were placed initially at C6 and C7 and the interspace was opened and uncinate spurs drilled down with high-speed drill.  The cartilaginous material was stripped from the endplates.  A thorough decompression and diskectomy was then performed with decompression of both C7 nerve roots widely as they exited neural foramina.  Hemostasis was assured.  Attention was then turned to the C4- 5 level where similar decompression was performed and the spinal cord, dura and both C5 neural foramina were decompressed.  At the C5-6 level, a similar decompression was performed, again neural foramina were widely decompressed.  Hemostasis was again assured.  After trial sizing, I was elected to use 5-mm PEEK interbody cages which were packed with PureGen soaked profuse blocks and autograft, and each level was countersunk appropriately.  Subsequently, a 54-mm Trestle anterior cervical  plate was affixed to the anterior cervical spine using variable angle 12 mm screws, 2 at C4, 2 at C5, 2 at C6 and 2 at C7.  The right C7 screw did not have good purchase and this was exchanged for a 14 mm x 4.5 mm screw.  Locking mechanisms were engaged.  The soft tissues were inspected and hemostasis was assured.  Wound was irrigated.  The final x- ray demonstrated well-positioned interbody grafts and anterior  cervical plate.  The traction weight was removed prior to placing the plate and screws.  Subsequently, the platysmal layer was closed with 3-0 Vicryl sutures and skin edges were approximated with 3-0 Vicryl subcuticular stitch.  The wound was dressed with Dermabond.  The patient was extubated in the operating room, taken to the recovery room in stable satisfactory condition having tolerated the operation well.  Counts were correct at the end of the case.     Danae Orleans. Venetia Maxon, M.D.     JDS/MEDQ  D:  07/04/2010  T:  07/05/2010  Job:  [id number]  Electronically Signed by Maeola Harman M.D. on 07/07/2010 09:19:20 AM

## 2010-07-20 NOTE — Consult Note (Signed)
NAME:  Janet Wilson, Janet Wilson NO.:  [id number]  MEDICAL RECORD NO.:  [id number]           PATIENT TYPE:  E  LOCATION:  WLED                         FACILITY:  [facility]  PHYSICIAN:  Loreta Ave, MD DATE OF BIRTH:  1934/11/30  DATE OF CONSULTATION:  06/04/2010 DATE OF DISCHARGE:  06/04/2010                                CONSULTATION   REFERRING PHYSICIAN:  Orlene Och, MD  CHIEF COMPLAINT:  Right ring finger deformity and pain.  HISTORY OF PRESENT ILLNESS:  The patient is a 76 year old right-hand dominant female who presents to [facility] Emergency Room complaining of a laceration and deformity of the right ring finger.  She slipped and fell while walking to the [facility], landing on her right 4th finger.  She denies any other injuries.  She notes limited range of motion and pain.  She describes the pain is being moderate at 7/10.  It is relieved minimally by rest, is aggravated by movement and contact. Her tetanus status is unknown and given in the emergency room.  PAST MEDICAL HISTORY:  Significant for, 1. Glaucoma. 2. Migraine headaches.  ALLERGIES:  She has no known drug allergies.  MEDICATIONS:  She takes, 1. Verapamil 108 mg p.o. daily. 2. Zomig p.o. p.r.n. for migraines. 3. Azopt 1 drop to the left eye twice a day. 4. Citracal plus D 1 tablet p.o. daily.  PAST SURGICAL HISTORY:  Significant for arthroscopic knee surgery and bladder suspension and a rotator cuff repair.  FAMILY HISTORY:  She denies.  SOCIAL HISTORY:  She denies history of tobacco.  She drinks occasionally.  Denies history of IV drug use.  REVIEW OF SYSTEMS:  Negative for nausea, vomiting, fever, chills, and diarrhea.  She notes chronic numbness in the right hand that is unchanged today.  X-ray examination of the right hand reveals a right 4th finger fracture dislocation.  The only fracture fragment is the dorsal left of the middle phalanx, which is minimally  displaced.  There is no fracture of the proximal phalanx.  PHYSICAL EXAMINATION:  VITAL SIGNS:  Heart rate is 91, blood pressure is 117/82, respirations are 16, temperature is 98.3. GENERAL:  She is in no acute distress, alert, appropriate.  Cranial nerves II through XII are intact. NECK:  Supple, full range of motion. HEART:  Regular rate and rhythm. EXTREMITIES:  Focus examination of the right upper extremity reveals normal active and passive range of motion of the shoulder, elbow, wrist, and uninvolved digits.  She is unable to flex the PIP or DIP joints of the right ring finger secondary to pain.  Power of the flexor tendons are intact.  She has normal sensation in the distribution of the median nerve, however, the ring finger, I am unable to assess because she has received a nerve block at that finger previously.  The small finger sensation is intact to light touch.  She has 5/5 intrinsic and extrinsic muscular strength except to the ring finger secondary to pain.  She has normal dermatoglyphics and 2+ radial pulse.  ASSESSMENT AND PLAN:  Right ring finger open fracture dislocation of the proximal interphalangeal joint in a 76 year old Caucasian  right-hand- dominant female.  After verbal consent, the wound was cleansed with Betadine and her dislocation was relocated.  She was then taken to range of motion and had complete normal active range of motion with no recurrent subluxation.  The PIP joint is stable to radial and ulnar stress and extension on the 30 degrees of flexion.  Next, the wound was irrigated copiously with normal saline and her laceration, which was 2 cm long was repaired with 4-0 chromic interrupted sutures.  A finger splint was then applied as well as a dry sterile dressing.  She tolerated this very well.  She will follow up with me in 8-10 days for wound check and initiation of hand therapy.     Loreta Ave, MD     CF/MEDQ  D:  06/04/2010  T:   06/05/2010  Job:  [id number]  Electronically Signed by Loreta Ave MD on 07/20/2010 02:11:48 PM

## 2010-10-19 ENCOUNTER — Ambulatory Visit: Payer: Medicare Other | Admitting: Gynecology

## 2010-11-01 ENCOUNTER — Ambulatory Visit
Admission: RE | Admit: 2010-11-01 | Discharge: 2010-11-01 | Disposition: A | Discharge status: Home / Self Care | Payer: Medicare Other | Source: Ambulatory Visit | Attending: Gastroenterology | Admitting: Gastroenterology

## 2010-11-01 ENCOUNTER — Other Ambulatory Visit: Payer: Self-pay | Admitting: Gastroenterology

## 2010-11-01 ENCOUNTER — Ambulatory Visit: Payer: Medicare Other | Admitting: Gynecology

## 2010-11-09 ENCOUNTER — Ambulatory Visit: Payer: Medicare Other | Admitting: Gynecology

## 2011-05-29 ENCOUNTER — Other Ambulatory Visit: Payer: Self-pay | Admitting: Internal Medicine

## 2011-05-29 DIAGNOSIS — E079 Disorder of thyroid, unspecified: Secondary | ICD-10-CM

## 2011-05-31 ENCOUNTER — Ambulatory Visit
Admission: RE | Admit: 2011-05-31 | Discharge: 2011-05-31 | Disposition: A | Discharge status: Home / Self Care | Payer: Medicare Other | Source: Ambulatory Visit | Attending: Internal Medicine | Admitting: Internal Medicine

## 2011-05-31 DIAGNOSIS — E079 Disorder of thyroid, unspecified: Secondary | ICD-10-CM

## 2011-06-05 ENCOUNTER — Other Ambulatory Visit: Payer: Self-pay | Admitting: Internal Medicine

## 2011-06-05 DIAGNOSIS — E041 Nontoxic single thyroid nodule: Secondary | ICD-10-CM

## 2011-06-12 ENCOUNTER — Ambulatory Visit
Admission: RE | Admit: 2011-06-12 | Discharge: 2011-06-12 | Disposition: A | Discharge status: Home / Self Care | Payer: Medicare Other | Source: Ambulatory Visit | Attending: Internal Medicine | Admitting: Internal Medicine

## 2011-06-12 ENCOUNTER — Other Ambulatory Visit (HOSPITAL_COMMUNITY)
Admission: RE | Admit: 2011-06-12 | Discharge: 2011-06-12 | Disposition: A | Discharge status: Home / Self Care | Payer: Medicare Other | Source: Ambulatory Visit | Attending: Internal Medicine | Admitting: Internal Medicine

## 2011-06-12 DIAGNOSIS — E049 Nontoxic goiter, unspecified: Secondary | ICD-10-CM | POA: Insufficient documentation

## 2011-06-12 DIAGNOSIS — E041 Nontoxic single thyroid nodule: Secondary | ICD-10-CM

## 2011-06-12 NOTE — Procedures (Signed)
US guided FNA performed of dominant right mid to lower thyroid nodule x3 via 25 gauge needles; US guided FNA performed of dominant left mid thyroid nodule x3 via 25 gauge needles. No immediate complications. Path pending.

## 2011-06-22 ENCOUNTER — Other Ambulatory Visit: Payer: Self-pay | Admitting: Orthopedic Surgery

## 2011-06-22 NOTE — Progress Notes (Signed)
Preoperative surgical orders have been place into the Epic hospital system for [facility] on 06/22/2011, 9:45 AM  by Patrica Duel for surgery on 07/04/2011.  Preop Total Knee orders including Bupivacaine On-Q pump, IV Tylenol, and IV Decadron as long as there are no contraindications to the above medications.

## 2011-06-27 ENCOUNTER — Encounter (HOSPITAL_COMMUNITY)
Admission: RE | Admit: 2011-06-27 | Discharge: 2011-06-27 | Disposition: A | Discharge status: Home / Self Care | Payer: Medicare Other | Source: Ambulatory Visit | Attending: Orthopedic Surgery | Admitting: Orthopedic Surgery

## 2011-06-27 ENCOUNTER — Encounter (HOSPITAL_COMMUNITY): Payer: Self-pay

## 2011-06-27 ENCOUNTER — Encounter (HOSPITAL_COMMUNITY): Payer: Self-pay | Admitting: Pharmacy Technician

## 2011-06-27 HISTORY — DX: Gastro-esophageal reflux disease without esophagitis: K21.9

## 2011-06-27 HISTORY — DX: Unspecified osteoarthritis, unspecified site: M19.90

## 2011-06-27 HISTORY — DX: Headache: R51

## 2011-06-27 HISTORY — DX: Nontoxic goiter, unspecified: E04.9

## 2011-06-27 HISTORY — DX: Overactive bladder: N32.81

## 2011-06-27 LAB — CBC
HCT: 37.9 % (ref 36.0–46.0)
Hemoglobin: 12.6 g/dL (ref 12.0–15.0)
MCH: 30.9 pg (ref 26.0–34.0)
MCHC: 33.2 g/dL (ref 30.0–36.0)
MCV: 92.9 fL (ref 78.0–100.0)
Platelets: 381 10*3/uL (ref 150–400)
RBC: 4.08 MIL/uL (ref 3.87–5.11)
RDW: 12.5 % (ref 11.5–15.5)
WBC: 7.5 10*3/uL (ref 4.0–10.5)

## 2011-06-27 LAB — URINALYSIS, ROUTINE W REFLEX MICROSCOPIC
Bilirubin Urine: NEGATIVE
Glucose, UA: NEGATIVE mg/dL
Hgb urine dipstick: NEGATIVE
Ketones, ur: NEGATIVE mg/dL
Nitrite: NEGATIVE
Protein, ur: NEGATIVE mg/dL
Specific Gravity, Urine: 1.016 (ref 1.005–1.030)
Urobilinogen, UA: 0.2 mg/dL (ref 0.0–1.0)
pH: 6 (ref 5.0–8.0)

## 2011-06-27 LAB — URINE MICROSCOPIC-ADD ON

## 2011-06-27 LAB — APTT: aPTT: 32 seconds (ref 24–37)

## 2011-06-27 LAB — COMPREHENSIVE METABOLIC PANEL
ALT: 15 U/L (ref 0–35)
AST: 14 U/L (ref 0–37)
Albumin: 3.6 g/dL (ref 3.5–5.2)
Alkaline Phosphatase: 80 U/L (ref 39–117)
BUN: 36 mg/dL — ABNORMAL HIGH (ref 6–23)
CO2: 28 mEq/L (ref 19–32)
Calcium: 9.1 mg/dL (ref 8.4–10.5)
Chloride: 102 mEq/L (ref 96–112)
Creatinine, Ser: 1.35 mg/dL — ABNORMAL HIGH (ref 0.50–1.10)
GFR calc Af Amer: 43 mL/min — ABNORMAL LOW (ref >90)
GFR calc non Af Amer: 37 mL/min — ABNORMAL LOW (ref >90)
Glucose, Bld: 91 mg/dL (ref 70–99)
Potassium: 3.8 mEq/L (ref 3.5–5.1)
Sodium: 140 mEq/L (ref 135–145)
Total Bilirubin: 0.4 mg/dL (ref 0.3–1.2)
Total Protein: 7.6 g/dL (ref 6.0–8.3)

## 2011-06-27 LAB — SURGICAL PCR SCREEN
MRSA, PCR: NEGATIVE
Staphylococcus aureus: NEGATIVE

## 2011-06-27 LAB — PROTIME-INR
INR: 1.03 (ref 0.00–1.49)
Prothrombin Time: 13.7 seconds (ref 11.6–15.2)

## 2011-06-27 NOTE — Patient Instructions (Signed)
YOUR SURGERY IS SCHEDULED ON:  WED  5/22  AT  7:15 AM  REPORT TO [facility] AT:  5:15 A,      PHONE # FOR SHORT STAY IS [phone]  DO NOT EAT OR DRINK ANYTHING AFTER MIDNIGHT THE NIGHT BEFORE YOUR SURGERY.  YOU MAY BRUSH YOUR TEETH, RINSE OUT YOUR MOUTH--BUT NO WATER, NO FOOD, NO CHEWING GUM, NO MINTS, NO CANDIES, NO CHEWING TOBACCO.  PLEASE TAKE THE FOLLOWING MEDICATIONS THE AM OF YOUR SURGERY WITH A FEW SIPS OF WATER:  OMEPRAZOLE, VERAPAMIL.  IF MIGRAINE-TAKE ZOMIG    IF YOU USE INHALERS--USE YOUR INHALERS THE AM OF YOUR SURGERY AND BRING INHALERS TO THE HOSPITAL -TAKE TO SURGERY.    IF YOU ARE DIABETIC:  DO NOT TAKE ANY DIABETIC MEDICATIONS THE AM OF YOUR SURGERY.  IF YOU TAKE INSULIN IN THE EVENINGS--PLEASE ONLY TAKE 1/2 NORMAL EVENING DOSE THE NIGHT BEFORE YOUR SURGERY.  NO INSULIN THE AM OF YOUR SURGERY.  IF YOU HAVE SLEEP APNEA AND USE CPAP OR BIPAP--PLEASE BRING THE MASK --NOT THE MACHINE-NOT THE TUBING   -JUST THE MASK. DO NOT BRING VALUABLES, MONEY, CREDIT CARDS.  CONTACT LENS, DENTURES / PARTIALS, GLASSES SHOULD NOT BE WORN TO SURGERY AND IN MOST CASES-HEARING AIDS WILL NEED TO BE REMOVED.  BRING YOUR GLASSES CASE, ANY EQUIPMENT NEEDED FOR YOUR CONTACT LENS. FOR PATIENTS ADMITTED TO THE HOSPITAL--CHECK OUT TIME THE DAY OF DISCHARGE IS 11:00 AM.  ALL INPATIENT ROOMS ARE PRIVATE - WITH BATHROOM, TELEPHONE, TELEVISION AND WIFI INTERNET. IF YOU ARE BEING DISCHARGED THE SAME DAY OF YOUR SURGERY--YOU CAN NOT DRIVE YOURSELF HOME--AND SHOULD NOT GO HOME ALONE BY TAXI OR BUS.  NO DRIVING OR OPERATING MACHINERY FOR 24 HOURS FOLLOWING ANESTHESIA / PAIN MEDICATIONS.                            SPECIAL INSTRUCTIONS:  CHLORHEXIDINE SOAP SHOWER (other brand names are Betasept and Hibiclens ) PLEASE SHOWER WITH CHLORHEXIDINE THE NIGHT BEFORE YOUR SURGERY AND THE AM OF YOUR SURGERY. DO NOT USE CHLORHEXIDINE ON YOUR FACE OR PRIVATE AREAS--YOU MAY USE YOUR NORMAL SOAP THOSE AREAS  AND YOUR NORMAL SHAMPOO.  WOMEN SHOULD AVOID SHAVING UNDER ARMS AND SHAVING LEGS 48 HOURS BEFORE USING CHLORHEXIDINE TO AVOID SKIN IRRITATION.  DO NOT USE IF ALLERGIC TO CHLORHEXIDINE.  PLEASE READ OVER ANY  FACT SHEETS THAT YOU WERE GIVEN: MRSA INFORMATION, BLOOD TRANSFUSION INFORMATION, INCENTIVE SPIROMETER INFORMATION.

## 2011-06-27 NOTE — Pre-Procedure Instructions (Addendum)
PT HAS STRONG FAMILY HX HEART DISEASE - HER LAST EKG REPORT 05/02/10 AT [facility] ABNORMAL " POSSIBLE INFARCT, AGE UNDETERMINED".  EKG REPEATED TODAY AT [facility] PREOP. CXR REPORT 11/01/10 AT [facility] IN EPIC AND COPY ON PT'S CHART. CBC, CMET, PT, PTT WERE DONE TODAY PREOP--PT UNABLE TO GIVE URINE SPECIMEN--SHE WILL BRING SPECIMEN BACK TO HOSPITAL. T/S WILL BE DONE DAY OF SURGERY. PT BROUGHT HER ENVELOPE WITH HER H&P FROM DR. ALUISIO'S OFFICE AND HER MEDICAL CLEARANCE FROM [facility]--FORMS PLACED ON HER CHART.

## 2011-06-28 NOTE — Pre-Procedure Instructions (Signed)
NOTE WAS FAXED TODAY TO DR. Lequita Halt TO PLEASE REVIEW PT'S CMET REPORT IN EPIC-HER BUN 36, CREAT 1.35.  FAXED NOTE RECEIVED BACK FROM DR. Lequita Halt -LABS REVIEWED-NO ACTION NEEDED.

## 2011-06-28 NOTE — Pre-Procedure Instructions (Signed)
PT BROUGHT HER URINE SPECIMEN -URINALYSIS WAS DONE TODAY

## 2011-07-02 ENCOUNTER — Other Ambulatory Visit: Payer: Self-pay | Admitting: Orthopedic Surgery

## 2011-07-02 NOTE — H&P (Signed)
Janet Wilson  DOB: [birth year] Married / Language: English / Race: White Female  Date of Admission:  07/04/2011  Chief Complaint:  Right Knee Pain  History of Present Illness The patient is a 77 year old female who comes in for a preoperative History and Physical. The patient is scheduled for a right total knee arthroplasty to be performed by Dr. Gus Rankin. Aluisio, MD at [facility] on 07/04/2011. The patient is a 77 year old female who presents for a recheck of Follow-up Knee. The patient is being followed for their right knee pain and osteoarthritis. Symptoms reported today include: pain (worse with going down steps), stiffness and instability. The patient feels that they are doing poorly and report their pain level to be moderate. The following medication has been used for pain control: antiinflammatory medication (Advil prn). The patient has had the Hyalgan series. The patient has not gotten any relief of their symptoms with viscosupplementation. Note for "Follow-up Knee": She has been taking Cosamin DS also. Janet Wilson states the knee is hurting at all times now. It is limiting what she can and can not do. She occasionally gets swelling. The knee wants to give out on her. Pain is worse anteriorly and laterally. She is not having any locking episodes. She is having some difficulty with ADLs. She is starting to get pain in her lower back and her opposite hip. She is now ready to proceed with knee surgery. They have been treated conservatively in the past for the above stated problem and despite conservative measures, they continue to have progressive pain and severe functional limitations and dysfunction. They have failed non-operative management. It is felt that they would benefit from undergoing total joint replacement. Risks and benefits of the procedure have been discussed with the patient and they elect to proceed with surgery. There are no active contraindications to surgery  such as ongoing infection or rapidly progressive neurological disease.     Problem List/Past Medical Migraine Headache Bladder Problems. Overactive Bladder Gastroesophageal Reflux Disease Goiter. Thyroid   Allergies No Known Drug Allergies   Family History Father. Deceased, Alzheimer's disease. age 15 Mother. Deceased, Heart disease. age 61   Social History Tobacco use. Never smoker. Alcohol use. Occasional alcohol use. Seldom Marital status. Married. Children. 2 Living situation. Lives with spouse. Post-Surgical Plans. Plan is to go home   Medication History Verapamil HCl ( Oral) Specific dose unknown - Active. Azopt ( Ophthalmic) Specific dose unknown - Active. Vitamin D (1 Oral) Specific dose unknown - Active. Fish Oil Concentrate (1 Oral) Specific dose unknown - Active. Calcium Citrate (1 Oral) Specific dose unknown - Active. Aleve (1 Oral) Specific dose unknown - Active. Probiotic ( Oral) Specific dose unknown - Active.   Past Surgical History Neck Disc Surgery. Fusion Cataract Extraction-Bilateral. Lens Implants Bladder Sling Procedure Arthroscopic Knee Surgery - Right. Date: 11/2009.   Review of Systems General:Not Present- Chills, Fever, Night Sweats, Fatigue, Weight Gain, Weight Loss and Memory Loss. Skin:Not Present- Hives, Itching, Rash, Eczema and Lesions. HEENT:Not Present- Tinnitus, Headache, Double Vision, Visual Loss, Hearing Loss and Dentures. Respiratory:Not Present- Shortness of breath with exertion, Shortness of breath at rest, Allergies, Coughing up blood and Chronic Cough. Cardiovascular:Not Present- Chest Pain, Racing/skipping heartbeats, Difficulty Breathing Lying Down, Murmur, Swelling and Palpitations. Gastrointestinal:Not Present- Bloody Stool, Heartburn, Abdominal Pain, Vomiting, Nausea, Constipation, Diarrhea, Difficulty Swallowing, Jaundice and Loss of appetitie. Female Genitourinary:Not Present- Blood in  Urine, Urinary frequency, Weak urinary stream, Discharge, Flank Pain, Incontinence, Painful Urination,  Urgency, Urinary Retention and Urinating at Night. Musculoskeletal:Present- Joint Pain. Not Present- Muscle Weakness, Muscle Pain, Joint Swelling, Back Pain, Morning Stiffness and Spasms. Neurological:Not Present- Tremor, Dizziness, Blackout spells, Paralysis, Difficulty with balance and Weakness. Psychiatric:Not Present- Insomnia.   Vitals Weight: 121 lb Height: 63.5 in Body Surface Area: 1.57 m Body Mass Index: 21.1 kg/m Pulse: 92 (Regular) Resp.: 16 (Unlabored) BP: 112/70 (Sitting, Right Arm, Standard)    Physical Exam The physical exam findings are as follows:   General Mental Status - Alert, cooperative and good historian. General Appearance- pleasant. Not in acute distress. Orientation- Oriented X3. Build & Nutrition- Well nourished and Well developed.   Head and Neck Head- normocephalic, atraumatic . Neck Global Assessment- supple. no bruit auscultated on the right and no bruit auscultated on the left.   Eye Pupil- Bilateral- Regular and Round. Motion- Bilateral- EOMI. wears glasses  Chest and Lung Exam Auscultation: Breath sounds:- clear at anterior chest wall and - clear at posterior chest wall. Adventitious sounds:- No Adventitious sounds.   Cardiovascular Auscultation:Rhythm- Regular rate and rhythm. Heart Sounds- S1 WNL and S2 WNL. Murmurs & Other Heart Sounds:Auscultation of the heart reveals - No Murmurs.   Abdomen Palpation/Percussion:Tenderness- Abdomen is non-tender to palpation. Rigidity (guarding)- Abdomen is soft. Auscultation:Auscultation of the abdomen reveals - Bowel sounds normal.   Female Genitourinary Not done, not pertinent to present illness  Musculoskeletal On exam well developed female alert and oriented in no apparent distress. Right knee shows a slight valgus deformity. Range of  motion is about 5 to 130. There is moderate crepitus on range of motion. She is tender in the lateral jointline. There is no medial tenderness or instability. Pulses, sensation and motor are intact. She walks with a slightly antalgic gait.  RADIOGRAPHS: We obtained radiographs today to compare to a year ago. This is an AP both knees and lateral of the right. She is basically bone on bone lateral compartment now. There is patellofemoral involvement also. This has progressed from last year.  Assessment & Plan Osteoarthritis Right Knee  Note: Patient is for a right total knee replacement by Dr. Lequita Halt.  Plan is to go home after the surgery.  PCP - Dr. Kevan Ny - Patient has been seen preoperatively and felt to be stable for surgery.  Signed electronically by Roberts Gaudy, PA-C

## 2011-07-04 ENCOUNTER — Encounter (HOSPITAL_COMMUNITY)
Admission: RE | Disposition: A | Discharge status: Home Health | Payer: Self-pay | Source: Ambulatory Visit | Attending: Orthopedic Surgery

## 2011-07-04 ENCOUNTER — Ambulatory Visit (HOSPITAL_COMMUNITY): Payer: Medicare Other | Admitting: Anesthesiology

## 2011-07-04 ENCOUNTER — Inpatient Hospital Stay (HOSPITAL_COMMUNITY)
Admission: RE | Admit: 2011-07-04 | Discharge: 2011-07-07 | DRG: 470 | Disposition: A | Discharge status: Home Health | Payer: Medicare Other | Source: Ambulatory Visit | Attending: Orthopedic Surgery | Admitting: Orthopedic Surgery

## 2011-07-04 ENCOUNTER — Encounter (HOSPITAL_COMMUNITY): Payer: Self-pay | Admitting: Anesthesiology

## 2011-07-04 ENCOUNTER — Encounter (HOSPITAL_COMMUNITY): Payer: Self-pay

## 2011-07-04 ENCOUNTER — Encounter (HOSPITAL_COMMUNITY): Payer: Self-pay | Admitting: Orthopedic Surgery

## 2011-07-04 DIAGNOSIS — M179 Osteoarthritis of knee, unspecified: Secondary | ICD-10-CM | POA: Diagnosis present

## 2011-07-04 DIAGNOSIS — D649 Anemia, unspecified: Secondary | ICD-10-CM | POA: Diagnosis not present

## 2011-07-04 DIAGNOSIS — M1710 Unilateral primary osteoarthritis, unspecified knee: Secondary | ICD-10-CM | POA: Diagnosis present

## 2011-07-04 DIAGNOSIS — Z01812 Encounter for preprocedural laboratory examination: Secondary | ICD-10-CM

## 2011-07-04 DIAGNOSIS — K219 Gastro-esophageal reflux disease without esophagitis: Secondary | ICD-10-CM | POA: Diagnosis present

## 2011-07-04 HISTORY — PX: TOTAL KNEE ARTHROPLASTY: SHX125

## 2011-07-04 LAB — TYPE AND SCREEN
ABO/RH(D): O POS
Antibody Screen: NEGATIVE

## 2011-07-04 LAB — ABO/RH: ABO/RH(D): O POS

## 2011-07-04 SURGERY — ARTHROPLASTY, KNEE, TOTAL
Anesthesia: Spinal | Site: Knee | Laterality: Right | Wound class: Clean

## 2011-07-04 MED ORDER — HYDROMORPHONE HCL PF 1 MG/ML IJ SOLN
INTRAMUSCULAR | Status: AC
  Filled 2011-07-04: qty 1

## 2011-07-04 MED ORDER — CEFAZOLIN SODIUM-DEXTROSE 2-3 GM-% IV SOLR
2.0000 g | INTRAVENOUS | Status: AC
  Administered 2011-07-04: 1 g via INTRAVENOUS

## 2011-07-04 MED ORDER — CHLORHEXIDINE GLUCONATE 4 % EX LIQD
60.0000 mL | Freq: Once | CUTANEOUS | Status: DC
  Filled 2011-07-04: qty 60

## 2011-07-04 MED ORDER — ACETAMINOPHEN 10 MG/ML IV SOLN
1000.0000 mg | Freq: Four times a day (QID) | INTRAVENOUS | Status: AC
  Administered 2011-07-04 – 2011-07-05 (×4): 1000 mg via INTRAVENOUS
  Filled 2011-07-04 (×4): qty 100

## 2011-07-04 MED ORDER — MORPHINE SULFATE (PF) 1 MG/ML IV SOLN
INTRAVENOUS | Status: DC
  Administered 2011-07-04: 2 mg via INTRAVENOUS
  Administered 2011-07-04: 1 mg via INTRAVENOUS
  Administered 2011-07-04: 10 mg via INTRAVENOUS
  Administered 2011-07-05: 2 mg via INTRAVENOUS
  Administered 2011-07-05 (×2): 1 mg via INTRAVENOUS

## 2011-07-04 MED ORDER — PHENYLEPHRINE HCL 10 MG/ML IJ SOLN
INTRAMUSCULAR | Status: DC | PRN
  Administered 2011-07-04: 40 ug via INTRAVENOUS
  Administered 2011-07-04 (×2): 80 ug via INTRAVENOUS

## 2011-07-04 MED ORDER — DIAZEPAM 5 MG PO TABS
2.5000 mg | ORAL_TABLET | Freq: Every day | ORAL | Status: DC
  Administered 2011-07-04 – 2011-07-06 (×3): 2.5 mg via ORAL
  Filled 2011-07-04 (×4): qty 1

## 2011-07-04 MED ORDER — HYDROMORPHONE HCL PF 1 MG/ML IJ SOLN
0.2500 mg | INTRAMUSCULAR | Status: DC | PRN
  Administered 2011-07-04 (×2): 0.5 mg via INTRAVENOUS

## 2011-07-04 MED ORDER — DEXAMETHASONE SODIUM PHOSPHATE 10 MG/ML IJ SOLN
INTRAMUSCULAR | Status: DC | PRN
  Administered 2011-07-04: 10 mg via INTRAVENOUS

## 2011-07-04 MED ORDER — LACTATED RINGERS IV SOLN
INTRAVENOUS | Status: DC | PRN
  Administered 2011-07-04 (×3): via INTRAVENOUS

## 2011-07-04 MED ORDER — BUPIVACAINE 0.25 % ON-Q PUMP SINGLE CATH 300ML
INJECTION | Status: DC | PRN
  Administered 2011-07-04: 300 mL

## 2011-07-04 MED ORDER — BUPIVACAINE ON-Q PAIN PUMP (FOR ORDER SET NO CHG)
INJECTION | Status: DC
  Filled 2011-07-04: qty 1

## 2011-07-04 MED ORDER — PHENOL 1.4 % MT LIQD
1.0000 | OROMUCOSAL | Status: DC | PRN
  Filled 2011-07-04: qty 177

## 2011-07-04 MED ORDER — ACETAMINOPHEN 325 MG PO TABS: 650.0000 mg | ORAL_TABLET | Freq: Four times a day (QID) | ORAL | Status: DC | PRN

## 2011-07-04 MED ORDER — 0.9 % SODIUM CHLORIDE (POUR BTL) OPTIME
TOPICAL | Status: DC | PRN
  Administered 2011-07-04: 1000 mL

## 2011-07-04 MED ORDER — DIPHENHYDRAMINE HCL 12.5 MG/5ML PO ELIX: 12.5000 mg | ORAL_SOLUTION | Freq: Four times a day (QID) | ORAL | Status: DC | PRN

## 2011-07-04 MED ORDER — DOCUSATE SODIUM 100 MG PO CAPS
100.0000 mg | ORAL_CAPSULE | Freq: Two times a day (BID) | ORAL | Status: DC
  Administered 2011-07-04 – 2011-07-07 (×6): 100 mg via ORAL

## 2011-07-04 MED ORDER — MENTHOL 3 MG MT LOZG
1.0000 | LOZENGE | OROMUCOSAL | Status: DC | PRN
  Filled 2011-07-04: qty 9

## 2011-07-04 MED ORDER — KCL IN DEXTROSE-NACL 20-5-0.9 MEQ/L-%-% IV SOLN
INTRAVENOUS | Status: DC
  Administered 2011-07-05: 07:00:00 via INTRAVENOUS
  Filled 2011-07-04 (×3): qty 1000

## 2011-07-04 MED ORDER — ONDANSETRON HCL 4 MG/2ML IJ SOLN
INTRAMUSCULAR | Status: DC | PRN
  Administered 2011-07-04: 4 mg via INTRAVENOUS

## 2011-07-04 MED ORDER — POLYETHYLENE GLYCOL 3350 17 G PO PACK: 17.0000 g | PACK | Freq: Every day | ORAL | Status: DC | PRN

## 2011-07-04 MED ORDER — ONDANSETRON HCL 4 MG/2ML IJ SOLN: 4.0000 mg | Freq: Four times a day (QID) | INTRAMUSCULAR | Status: DC | PRN

## 2011-07-04 MED ORDER — MIDAZOLAM HCL 5 MG/5ML IJ SOLN
INTRAMUSCULAR | Status: DC | PRN
  Administered 2011-07-04 (×4): 0.5 mg via INTRAVENOUS

## 2011-07-04 MED ORDER — MORPHINE SULFATE (PF) 1 MG/ML IV SOLN
INTRAVENOUS | Status: AC
  Filled 2011-07-04: qty 25

## 2011-07-04 MED ORDER — FLEET ENEMA 7-19 GM/118ML RE ENEM: 1.0000 | ENEMA | Freq: Once | RECTAL | Status: AC | PRN

## 2011-07-04 MED ORDER — RIVAROXABAN 10 MG PO TABS
10.0000 mg | ORAL_TABLET | Freq: Every day | ORAL | Status: DC
  Administered 2011-07-05 – 2011-07-07 (×3): 10 mg via ORAL
  Filled 2011-07-04 (×4): qty 1

## 2011-07-04 MED ORDER — LACTATED RINGERS IV SOLN: INTRAVENOUS | Status: DC

## 2011-07-04 MED ORDER — TEMAZEPAM 15 MG PO CAPS: 15.0000 mg | ORAL_CAPSULE | Freq: Every evening | ORAL | Status: DC | PRN

## 2011-07-04 MED ORDER — BUPIVACAINE 0.25 % ON-Q PUMP SINGLE CATH 300ML: 300.0000 mL | INJECTION | Status: DC

## 2011-07-04 MED ORDER — NALOXONE HCL 0.4 MG/ML IJ SOLN: 0.4000 mg | INTRAMUSCULAR | Status: DC | PRN

## 2011-07-04 MED ORDER — ACETAMINOPHEN 650 MG RE SUPP: 650.0000 mg | Freq: Four times a day (QID) | RECTAL | Status: DC | PRN

## 2011-07-04 MED ORDER — KETAMINE HCL 10 MG/ML IJ SOLN
INTRAMUSCULAR | Status: DC | PRN
  Administered 2011-07-04 (×3): 5 mg via INTRAVENOUS
  Administered 2011-07-04: 2.5 mg via INTRAVENOUS
  Administered 2011-07-04: 2.2 mg via INTRAVENOUS
  Administered 2011-07-04 (×8): 2.5 mg via INTRAVENOUS
  Administered 2011-07-04: 2.2 mg via INTRAVENOUS
  Administered 2011-07-04 (×2): 2.5 mg via INTRAVENOUS
  Administered 2011-07-04: 10 mg via INTRAVENOUS
  Administered 2011-07-04 (×4): 2.5 mg via INTRAVENOUS

## 2011-07-04 MED ORDER — METHOCARBAMOL 100 MG/ML IJ SOLN
500.0000 mg | Freq: Four times a day (QID) | INTRAMUSCULAR | Status: DC | PRN
  Filled 2011-07-04: qty 5

## 2011-07-04 MED ORDER — LIDOCAINE HCL (CARDIAC) 20 MG/ML IV SOLN
INTRAVENOUS | Status: DC | PRN
  Administered 2011-07-04: 50 mg via INTRAVENOUS

## 2011-07-04 MED ORDER — DIPHENHYDRAMINE HCL 50 MG/ML IJ SOLN: 12.5000 mg | Freq: Four times a day (QID) | INTRAMUSCULAR | Status: DC | PRN

## 2011-07-04 MED ORDER — OXYCODONE HCL 5 MG PO TABS
5.0000 mg | ORAL_TABLET | ORAL | Status: DC | PRN
  Administered 2011-07-04: 5 mg via ORAL
  Administered 2011-07-05 – 2011-07-07 (×10): 10 mg via ORAL
  Filled 2011-07-04 (×10): qty 2
  Filled 2011-07-04: qty 1
  Filled 2011-07-04: qty 2

## 2011-07-04 MED ORDER — BUPIVACAINE IN DEXTROSE 0.75-8.25 % IT SOLN
INTRATHECAL | Status: DC | PRN
  Administered 2011-07-04: 1.8 mL via INTRATHECAL

## 2011-07-04 MED ORDER — MEPERIDINE HCL 50 MG/ML IJ SOLN: 6.2500 mg | INTRAMUSCULAR | Status: DC | PRN

## 2011-07-04 MED ORDER — SODIUM CHLORIDE 0.9 % IR SOLN
Status: DC | PRN
  Administered 2011-07-04: 2000 mL

## 2011-07-04 MED ORDER — SODIUM CHLORIDE 0.9 % IV SOLN: INTRAVENOUS | Status: DC

## 2011-07-04 MED ORDER — ACETAMINOPHEN 10 MG/ML IV SOLN
1000.0000 mg | Freq: Once | INTRAVENOUS | Status: AC
  Administered 2011-07-04: 1000 mg via INTRAVENOUS

## 2011-07-04 MED ORDER — PROMETHAZINE HCL 25 MG/ML IJ SOLN: 6.2500 mg | INTRAMUSCULAR | Status: DC | PRN

## 2011-07-04 MED ORDER — BUPIVACAINE 0.25 % ON-Q PUMP SINGLE CATH 300ML
INJECTION | Status: AC
  Filled 2011-07-04: qty 300

## 2011-07-04 MED ORDER — BISACODYL 10 MG RE SUPP: 10.0000 mg | Freq: Every day | RECTAL | Status: DC | PRN

## 2011-07-04 MED ORDER — SODIUM CHLORIDE 0.9 % IJ SOLN: 9.0000 mL | INTRAMUSCULAR | Status: DC | PRN

## 2011-07-04 MED ORDER — PANTOPRAZOLE SODIUM 40 MG PO TBEC: 40.0000 mg | DELAYED_RELEASE_TABLET | Freq: Every day | ORAL | Status: DC

## 2011-07-04 MED ORDER — OMEPRAZOLE 20 MG PO CPDR
20.0000 mg | DELAYED_RELEASE_CAPSULE | Freq: Every day | ORAL | Status: DC
  Administered 2011-07-05 – 2011-07-06 (×2): 20 mg via ORAL
  Filled 2011-07-04 (×3): qty 1

## 2011-07-04 MED ORDER — FENTANYL CITRATE 0.05 MG/ML IJ SOLN
INTRAMUSCULAR | Status: DC | PRN
  Administered 2011-07-04: 25 ug via INTRAVENOUS
  Administered 2011-07-04 (×2): 12.5 ug via INTRAVENOUS
  Administered 2011-07-04: 100 ug via INTRAVENOUS
  Administered 2011-07-04 (×2): 25 ug via INTRAVENOUS

## 2011-07-04 MED ORDER — ONDANSETRON HCL 4 MG PO TABS: 4.0000 mg | ORAL_TABLET | Freq: Four times a day (QID) | ORAL | Status: DC | PRN

## 2011-07-04 MED ORDER — CEFAZOLIN SODIUM 1-5 GM-% IV SOLN
1.0000 g | Freq: Four times a day (QID) | INTRAVENOUS | Status: AC
  Administered 2011-07-04 – 2011-07-05 (×3): 1 g via INTRAVENOUS
  Filled 2011-07-04 (×3): qty 50

## 2011-07-04 MED ORDER — PROPOFOL 10 MG/ML IV EMUL
INTRAVENOUS | Status: DC | PRN
  Administered 2011-07-04: 200 ug/kg/min via INTRAVENOUS

## 2011-07-04 MED ORDER — METHOCARBAMOL 500 MG PO TABS
500.0000 mg | ORAL_TABLET | Freq: Four times a day (QID) | ORAL | Status: DC | PRN
  Administered 2011-07-04 – 2011-07-07 (×8): 500 mg via ORAL
  Filled 2011-07-04 (×8): qty 1

## 2011-07-04 MED ORDER — DEXAMETHASONE SODIUM PHOSPHATE 10 MG/ML IJ SOLN: 10.0000 mg | Freq: Once | INTRAMUSCULAR | Status: DC

## 2011-07-04 MED ORDER — METOCLOPRAMIDE HCL 5 MG/ML IJ SOLN: 5.0000 mg | Freq: Three times a day (TID) | INTRAMUSCULAR | Status: DC | PRN

## 2011-07-04 MED ORDER — METOCLOPRAMIDE HCL 10 MG PO TABS: 5.0000 mg | ORAL_TABLET | Freq: Three times a day (TID) | ORAL | Status: DC | PRN

## 2011-07-04 MED ORDER — CEFAZOLIN SODIUM-DEXTROSE 2-3 GM-% IV SOLR
INTRAVENOUS | Status: AC
  Filled 2011-07-04: qty 50

## 2011-07-04 MED ORDER — ACETAMINOPHEN 10 MG/ML IV SOLN
INTRAVENOUS | Status: AC
  Filled 2011-07-04: qty 100

## 2011-07-04 MED ORDER — DIPHENHYDRAMINE HCL 12.5 MG/5ML PO ELIX: 12.5000 mg | ORAL_SOLUTION | ORAL | Status: DC | PRN

## 2011-07-04 SURGICAL SUPPLY — 54 items
BAG SPEC THK2 15X12 ZIP CLS (MISCELLANEOUS) ×1
BAG ZIPLOCK 12X15 (MISCELLANEOUS) ×2 IMPLANT
BANDAGE ELASTIC 6 VELCRO ST LF (GAUZE/BANDAGES/DRESSINGS) ×2 IMPLANT
BANDAGE ESMARK 6X9 LF (GAUZE/BANDAGES/DRESSINGS) ×1 IMPLANT
BLADE SAG 18X100X1.27 (BLADE) ×2 IMPLANT
BLADE SAW SGTL 11.0X1.19X90.0M (BLADE) ×2 IMPLANT
BNDG CMPR 9X6 STRL LF SNTH (GAUZE/BANDAGES/DRESSINGS) ×1
BNDG ESMARK 6X9 LF (GAUZE/BANDAGES/DRESSINGS) ×2
BOWL SMART MIX CTS (DISPOSABLE) ×2 IMPLANT
CATH KIT ON-Q SILVERSOAK 5 (CATHETERS) ×1 IMPLANT
CATH KIT ON-Q SILVERSOAK 5IN (CATHETERS) ×2 IMPLANT
CEMENT HV SMART SET (Cement) ×5 IMPLANT
CLOTH BEACON ORANGE TIMEOUT ST (SAFETY) ×2 IMPLANT
CUFF TOURN SGL QUICK 34 (TOURNIQUET CUFF) ×2
CUFF TRNQT CYL 34X4X40X1 (TOURNIQUET CUFF) ×1 IMPLANT
DRAPE EXTREMITY T 121X128X90 (DRAPE) ×2 IMPLANT
DRAPE POUCH INSTRU U-SHP 10X18 (DRAPES) ×2 IMPLANT
DRAPE U-SHAPE 47X51 STRL (DRAPES) ×2 IMPLANT
DRSG ADAPTIC 3X8 NADH LF (GAUZE/BANDAGES/DRESSINGS) ×2 IMPLANT
DRSG PAD ABDOMINAL 8X10 ST (GAUZE/BANDAGES/DRESSINGS) ×1 IMPLANT
DURAPREP 26ML APPLICATOR (WOUND CARE) ×2 IMPLANT
ELECT REM PT RETURN 9FT ADLT (ELECTROSURGICAL) ×2
ELECTRODE REM PT RTRN 9FT ADLT (ELECTROSURGICAL) ×1 IMPLANT
EVACUATOR 1/8 PVC DRAIN (DRAIN) ×2 IMPLANT
FACESHIELD LNG OPTICON STERILE (SAFETY) ×10 IMPLANT
GLOVE BIO SURGEON STRL SZ7.5 (GLOVE) ×2 IMPLANT
GLOVE BIO SURGEON STRL SZ8 (GLOVE) ×2 IMPLANT
GLOVE BIOGEL PI IND STRL 8 (GLOVE) ×2 IMPLANT
GLOVE BIOGEL PI INDICATOR 8 (GLOVE) ×2
GOWN STRL NON-REIN LRG LVL3 (GOWN DISPOSABLE) ×2 IMPLANT
GOWN STRL REIN XL XLG (GOWN DISPOSABLE) ×2 IMPLANT
HANDPIECE INTERPULSE COAX TIP (DISPOSABLE) ×2
IMMOBILIZER KNEE 20 (SOFTGOODS) ×2
IMMOBILIZER KNEE 20 THIGH 36 (SOFTGOODS) ×1 IMPLANT
KIT BASIN OR (CUSTOM PROCEDURE TRAY) ×2 IMPLANT
MANIFOLD NEPTUNE II (INSTRUMENTS) ×2 IMPLANT
NS IRRIG 1000ML POUR BTL (IV SOLUTION) ×2 IMPLANT
PACK TOTAL JOINT (CUSTOM PROCEDURE TRAY) ×2 IMPLANT
PAD ABD 7.5X8 STRL (GAUZE/BANDAGES/DRESSINGS) ×2 IMPLANT
PADDING CAST COTTON 6X4 STRL (CAST SUPPLIES) ×4 IMPLANT
POSITIONER SURGICAL ARM (MISCELLANEOUS) ×2 IMPLANT
SET HNDPC FAN SPRY TIP SCT (DISPOSABLE) ×1 IMPLANT
SPONGE GAUZE 4X4 12PLY (GAUZE/BANDAGES/DRESSINGS) ×2 IMPLANT
STRIP CLOSURE SKIN 1/2X4 (GAUZE/BANDAGES/DRESSINGS) ×3 IMPLANT
SUCTION FRAZIER 12FR DISP (SUCTIONS) ×2 IMPLANT
SUT MNCRL AB 4-0 PS2 18 (SUTURE) ×2 IMPLANT
SUT PDS AB 1 CT1 27 (SUTURE) ×6 IMPLANT
SUT VIC AB 2-0 CT1 27 (SUTURE) ×6
SUT VIC AB 2-0 CT1 TAPERPNT 27 (SUTURE) ×3 IMPLANT
SUT VLOC 180 0 24IN GS25 (SUTURE) ×2 IMPLANT
TOWEL OR 17X26 10 PK STRL BLUE (TOWEL DISPOSABLE) ×4 IMPLANT
TRAY FOLEY CATH 14FRSI W/METER (CATHETERS) ×2 IMPLANT
WATER STERILE IRR 1500ML POUR (IV SOLUTION) ×2 IMPLANT
WRAP KNEE MAXI GEL POST OP (GAUZE/BANDAGES/DRESSINGS) ×3 IMPLANT

## 2011-07-04 NOTE — Anesthesia Preprocedure Evaluation (Addendum)
Anesthesia Evaluation  Patient identified by MRN, date of birth, ID band Patient awake    Reviewed: Allergy & Precautions, H&P , NPO status , Patient's Chart, lab work & pertinent test results  Airway Mallampati: II TM Distance: >3 FB Neck ROM: Full    Dental No notable dental hx.    Pulmonary neg pulmonary ROS,  breath sounds clear to auscultation  Pulmonary exam normal       Cardiovascular negative cardio ROS  Rhythm:Regular Rate:Normal     Neuro/Psych negative neurological ROS  negative psych ROS   GI/Hepatic negative GI ROS, Neg liver ROS, GERD-  Medicated and Controlled,  Endo/Other  negative endocrine ROS  Renal/GU negative Renal ROS  negative genitourinary   Musculoskeletal negative musculoskeletal ROS (+)   Abdominal   Peds negative pediatric ROS (+)  Hematology negative hematology ROS (+)   Anesthesia Other Findings   Reproductive/Obstetrics negative OB ROS                          Anesthesia Physical Anesthesia Plan  ASA: II  Anesthesia Plan: Spinal   Post-op Pain Management:    Induction:   Airway Management Planned:   Additional Equipment:   Intra-op Plan:   Post-operative Plan:   Informed Consent: I have reviewed the patients History and Physical, chart, labs and discussed the procedure including the risks, benefits and alternatives for the proposed anesthesia with the patient or authorized representative who has indicated his/her understanding and acceptance.   Dental advisory given  Plan Discussed with: CRNA  Anesthesia Plan Comments:        Anesthesia Quick Evaluation

## 2011-07-04 NOTE — Anesthesia Postprocedure Evaluation (Signed)
  Anesthesia Post-op Note  Patient: Janet Wilson  Procedure(s) Performed: Procedure(s) (LRB): TOTAL KNEE ARTHROPLASTY (Right)  Patient Location: PACU  Anesthesia Type: Spinal  Level of Consciousness: awake and alert   Airway and Oxygen Therapy: Patient Spontanous Breathing  Post-op Pain: mild  Post-op Assessment: Post-op Vital signs reviewed, Patient's Cardiovascular Status Stable, Respiratory Function Stable, Patent Airway and No signs of Nausea or vomiting  Post-op Vital Signs: stable  Complications: No apparent anesthesia complications

## 2011-07-04 NOTE — Transfer of Care (Signed)
Immediate Anesthesia Transfer of Care Note  Patient: Janet Wilson  Procedure(s) Performed: Procedure(s) (LRB): TOTAL KNEE ARTHROPLASTY (Right)  Patient Location: PACU  Anesthesia Type: MAC and Spinal  Level of Consciousness: awake, oriented, patient cooperative and responds to stimulation  Airway & Oxygen Therapy: Patient Spontanous Breathing and Patient connected to face mask oxygen  Post-op Assessment: Report given to PACU RN, Post -op Vital signs reviewed and stable and Patient moving all extremities  Post vital signs: Reviewed and stable  Complications: No apparent anesthesia complications

## 2011-07-04 NOTE — H&P (View-Only) (Signed)
Preoperative surgical orders have been place into the Epic hospital system for Janet Wilson on 06/22/2011, 9:45 AM  by Ford Peddie for surgery on 07/04/2011.  Preop Total Knee orders including Bupivacaine On-Q pump, IV Tylenol, and IV Decadron as long as there are no contraindications to the above medications. 

## 2011-07-04 NOTE — Preoperative (Signed)
Beta Blockers   Reason not to administer Beta Blockers:Not Applicable, not on home BB 

## 2011-07-04 NOTE — Op Note (Signed)
Pre-operative diagnosis- Osteoarthritis  Right knee(s)  Post-operative diagnosis- Osteoarthritis Right knee(s)  Procedure-  Right  Total Knee Arthroplasty  Surgeon- Gus Rankin. Pinkie Manger, MD  Assistant- Avel Peace, PA-C   Anesthesia-  Spinal EBL-* No blood loss amount entered *  Drains Hemovac  Tourniquet time-  Total Tourniquet Time Documented: Thigh (Right) - 61 minutes   Complications- None  Condition-PACU - hemodynamically stable.   Brief Clinical Note  Janet Wilson is a 77 y.o. year old female with end stage OA of her right knee with progressively worsening pain and dysfunction. She has constant pain, with activity and at rest and significant functional deficits with difficulties even with ADLs. She has had extensive non-op management including analgesics, injections of cortisone and viscosupplements, and home exercise program, but remains in significant pain with significant dysfunction.Radiographs show bone on bone arthritis lateral and patellofemoral with tibial subchondral sclerosis. She presents now for left Total Knee Arthroplasty.    Procedure in detail---   The patient is brought into the operating room and positioned supine on the operating table. After successful administration of  Spinal,   a tourniquet is placed high on the  Right thigh(s) and the lower extremity is prepped and draped in the usual sterile fashion. Time out is performed by the operating team and then the  Right lower extremity is wrapped in Esmarch, knee flexed and the tourniquet inflated to 300 mmHg.       A midline incision is made with a ten blade through the subcutaneous tissue to the level of the extensor mechanism. A fresh blade is used to make a medial parapatellar arthrotomy. Soft tissue over the proximal medial tibia is subperiosteally elevated to the joint line with a knife and into the semimembranosus bursa with a Cobb elevator. Soft tissue over the proximal lateral tibia is elevated with  attention being paid to avoiding the patellar tendon on the tibial tubercle. The patella is everted, knee flexed 90 degrees and the ACL and PCL are removed. Findings are bone on bone lateral and patellofemoral with lateral osteophytes.        The drill is used to create a starting hole in the distal femur and the canal is thoroughly irrigated with sterile saline to remove the fatty contents. The 5 degree Right  valgus alignment guide is placed into the femoral canal and the distal femoral cutting block is pinned to remove 11 mm off the distal femur. Resection is made with an oscillating saw.      The tibia is subluxed forward and the menisci are removed. The extramedullary alignment guide is placed referencing proximally at the medial aspect of the tibial tubercle and distally along the second metatarsal axis and tibial crest. The block is pinned to remove 2mm off the more deficient lateral  side. Resection is made with an oscillating saw. Size 3is the most appropriate size for the tibia and the proximal tibia is prepared with the modular drill and keel punch for that size.      The femoral sizing guide is placed and size 4 narrow is most appropriate. Rotation is marked off the epicondylar axis and confirmed by creating a rectangular flexion gap at 90 degrees. The size 4 cutting block is pinned in this rotation and the anterior, posterior and chamfer cuts are made with the oscillating saw. The intercondylar block is then placed and that cut is made.      Trial size 3 tibial component, trial size 4 narrow posterior stabilized femur  and a 10  mm posterior stabilized rotating platform insert trial is placed. Full extension is achieved with excellent varus/valgus and anterior/posterior balance throughout full range of motion. The patella is everted and thickness measured to be 22  mm. Wilson hand resection is taken to 12 mm, a 35 template is placed, lug holes are drilled, trial patella is placed, and it tracks  normally. Osteophytes are removed off the posterior femur with the trial in place. All trials are removed and the cut bone surfaces prepared with pulsatile lavage. Cement is mixed and once ready for implantation, the size 3 tibial implant, size  4 narrow posterior stabilized femoral component, and the size 35 patella are cemented in place and the patella is held with the clamp. The trial insert is placed and the knee held in full extension. All extruded cement is removed and once the cement is hard the permanent 10 mm posterior stabilized rotating platform insert is placed into the tibial tray. Upon flexing the knee it was noted that the femoral component lifted off the femur medially in deep flexion. I removed the femoral component and cement and placed the trial femur and insert. It was found that the posteromedial side was tight in flexion. I performed further oft issue release medially and refreshed the posterior medial bone cut. I replaced the trial femur and insert and the knee was then balanced well in flexion and extension with no lift off in flexion. We then mixed another batch of cement and cemented the 4 narrow femoral component into place. The permanent 10 mm insert is placed with excellent stability through full range of motion with no lift off of the femoral component.      The wound is copiously irrigated with saline solution and the extensor mechanism closed over a hemovac drain with #1 v-loc suture. The tourniquet is released for a total tourniquet time of 60  minutes. Flexion against gravity is 140 degrees and the patella tracks normally. Subcutaneous tissue is closed with 2.0 vicryl and subcuticular with running 4.0 Monocryl. The catheter for the Marcaine pain pump is placed and the pump is initiated. The incision is cleaned and dried and steri-strips and a bulky sterile dressing are applied. The limb is placed into a knee immobilizer and the patient is awakened and transported to recovery in  stable condition.      Please note that a surgical assistant was a medical necessity for this procedure in order to perform it in a safe and expeditious manner. Surgical assistant was necessary to retract the ligaments and vital neurovascular structures to prevent injury to them and also necessary for proper positioning of the limb to allow for anatomic placement of the prosthesis.   Gus Rankin Dion Sibal, MD    07/04/2011, 8:55 AM

## 2011-07-04 NOTE — Progress Notes (Signed)
Utilization review completed.  

## 2011-07-04 NOTE — Interval H&P Note (Signed)
History and Physical Interval Note:  07/04/2011 7:16 AM  Janet Wilson  has presented today for surgery, with the diagnosis of Osteoarthritis of the Right Knee  The various methods of treatment have been discussed with the patient and family. After consideration of risks, benefits and other options for treatment, the patient has consented to  Procedure(s) (LRB): TOTAL KNEE ARTHROPLASTY (Right) as a surgical intervention .  The patients' history has been reviewed, patient examined, no change in status, stable for surgery.  I have reviewed the patients' chart and labs.  Questions were answered to the patient's satisfaction.     Loanne Drilling

## 2011-07-04 NOTE — Anesthesia Procedure Notes (Addendum)
Anesthesia Regional Block:   Narrative:    Spinal  Patient location during procedure: OR Staffing Anesthesiologist: Phillips Grout Performed by: anesthesiologist  Preanesthetic Checklist Completed: patient identified, site marked, surgical consent, pre-op evaluation, timeout performed, IV checked, risks and benefits discussed and monitors and equipment checked Spinal Block Patient position: sitting Prep: Betadine Patient monitoring: heart rate, continuous pulse ox and blood pressure Approach: left paramedian Location: L3-4 Injection technique: single-shot Needle Needle type: Spinocan  Needle gauge: 22 G Needle length: 9 cm Additional Notes Expiration date of kit checked and confirmed. Patient tolerated procedure well, without complications.

## 2011-07-05 ENCOUNTER — Encounter (HOSPITAL_COMMUNITY): Payer: Self-pay | Admitting: Orthopedic Surgery

## 2011-07-05 LAB — CBC
HCT: 27.2 % — ABNORMAL LOW (ref 36.0–46.0)
Hemoglobin: 9 g/dL — ABNORMAL LOW (ref 12.0–15.0)
MCH: 30.4 pg (ref 26.0–34.0)
MCHC: 33.1 g/dL (ref 30.0–36.0)
MCV: 91.9 fL (ref 78.0–100.0)
Platelets: 229 10*3/uL (ref 150–400)
RBC: 2.96 MIL/uL — ABNORMAL LOW (ref 3.87–5.11)
RDW: 12.3 % (ref 11.5–15.5)
WBC: 9.9 10*3/uL (ref 4.0–10.5)

## 2011-07-05 LAB — BASIC METABOLIC PANEL
BUN: 25 mg/dL — ABNORMAL HIGH (ref 6–23)
CO2: 25 mEq/L (ref 19–32)
Calcium: 8.1 mg/dL — ABNORMAL LOW (ref 8.4–10.5)
Chloride: 105 mEq/L (ref 96–112)
Creatinine, Ser: 1.23 mg/dL — ABNORMAL HIGH (ref 0.50–1.10)
GFR calc Af Amer: 48 mL/min — ABNORMAL LOW (ref >90)
GFR calc non Af Amer: 42 mL/min — ABNORMAL LOW (ref >90)
Glucose, Bld: 151 mg/dL — ABNORMAL HIGH (ref 70–99)
Potassium: 3.9 mEq/L (ref 3.5–5.1)
Sodium: 138 mEq/L (ref 135–145)

## 2011-07-05 MED ORDER — MORPHINE SULFATE 2 MG/ML IJ SOLN
1.0000 mg | INTRAMUSCULAR | Status: DC | PRN
  Administered 2011-07-05: 2 mg via INTRAVENOUS
  Filled 2011-07-05: qty 1

## 2011-07-05 MED ORDER — POLYSACCHARIDE IRON COMPLEX 150 MG PO CAPS
150.0000 mg | ORAL_CAPSULE | Freq: Every day | ORAL | Status: DC
  Administered 2011-07-05 – 2011-07-07 (×3): 150 mg via ORAL
  Filled 2011-07-05 (×3): qty 1

## 2011-07-05 NOTE — Progress Notes (Signed)
Subjective: 1 Day Post-Op Procedure(s) (LRB): TOTAL KNEE ARTHROPLASTY (Right) Patient reports pain as mild and no sleep. Patient seen in rounds with Dr. Lequita Halt. Patient is well, but has had some minor complaints of insomnia/difficulty sleeping and pain in the knee, requiring pain medications We will start therapy today.  Plan is to go Home after hospital stay.  Objective: Vital signs in last 24 hours: Temp:  [97.4 F (36.3 C)-98.1 F (36.7 C)] 97.8 F (36.6 C) (05/23 1015) Pulse Rate:  [85-95] 93  (05/23 1015) Resp:  [12-17] 16  (05/23 1015) BP: (102-123)/(65-86) 106/69 mmHg (05/23 1015) SpO2:  [97 %-100 %] 100 % (05/23 1015) Weight:  [51.71 kg (114 lb)] 51.71 kg (114 lb) (05/22 1222)  Intake/Output from previous day:  Intake/Output Summary (Last 24 hours) at 07/05/11 1054 Last data filed at 07/05/11 0600  Gross per 24 hour  Intake   2168 ml  Output   2070 ml  Net     98 ml    Intake/Output this shift: 600 cc's since MN  Labs:  [facility] 07/05/11 0418  HGB 9.0*    Basename 07/05/11 0418  WBC 9.9  RBC 2.96*  HCT 27.2*  PLT 229    Basename 07/05/11 0418  NA 138  K 3.9  CL 105  CO2 25  BUN 25*  CREATININE 1.23*  GLUCOSE 151*  CALCIUM 8.1*   No results found for this basename: LABPT:2,INR:2 in the last 72 hours  EXAM General - Patient is Alert, Appropriate and Oriented Extremity - Neurovascular intact Sensation intact distally Dressing - dressing C/D/I Motor Function - intact, moving foot and toes well on exam.  Hemovac pulled without difficulty.  Past Medical History  Diagnosis Date  . Overactive bladder   . GERD (gastroesophageal reflux disease)   . Goiter     CAUSING COUGH, HOARSINESS AND DRY THROAT  . Headache     MIGRAINES - TAKES VERAPAMIL FOR PREVENTION OF MIGRAINES  . Arthritis     OA BOTH KNEES AND HANDS    Assessment/Plan: 1 Day Post-Op Procedure(s) (LRB): TOTAL KNEE ARTHROPLASTY (Right) Principal Problem:  *OA (osteoarthritis)  of knee   Advance diet Up with therapy Continue foley due to strict I&O and urinary output monitoring Discharge home with home health  DVT Prophylaxis - Xarelto Weight-Bearing as tolerated to right leg Keep foley until tomorrow. No vaccines. D/C PCA  Morphine, Change to IV push D/C O2 and Pulse OX and try on Room [address]  Patrica Duel 07/05/2011, 10:54 AM

## 2011-07-05 NOTE — Progress Notes (Signed)
Physical Therapy Treatment Patient Details Name: Janet Wilson MRN: [id number] DOB: Sep 06, 1934 Today's Date: 07/05/2011 Time: 8295-6213 PT Time Calculation (min): 22 min  PT Assessment / Plan / Recommendation Comments on Treatment Session       Follow Up Recommendations  Home health PT    Barriers to Discharge        Equipment Recommendations  Rolling walker with 5" wheels    Recommendations for Other Services OT consult  Frequency 7X/week   Plan Discharge plan remains appropriate    Precautions / Restrictions Precautions Precautions: Knee Required Braces or Orthoses: Knee Immobilizer - Right Knee Immobilizer - Right: Discontinue once straight leg raise with < 10 degree lag Restrictions Weight Bearing Restrictions: No Other Position/Activity Restrictions: WBAT   Pertinent Vitals/Pain     Mobility  Bed Mobility Bed Mobility: Sit to Supine Sit to Supine: 4: Min assist Details for Bed Mobility Assistance: cues for use of UEs to self assist Transfers Transfers: Sit to Stand;Stand to Sit Sit to Stand: 4: Min assist Stand to Sit: 4: Min assist Details for Transfer Assistance: cues for use of UEs to self assist and for LE management Ambulation/Gait Ambulation/Gait Assistance: 4: Min assist Ambulation Distance (Feet): 120 Feet Assistive device: Rolling walker Ambulation/Gait Assistance Details: cues for stride length, posture and position from RW Gait Pattern: Step-to pattern    Exercises     PT Diagnosis:    PT Problem List:   PT Treatment Interventions:     PT Goals Acute Rehab PT Goals PT Goal Formulation: With patient Time For Goal Achievement: 07/10/11 Potential to Achieve Goals: Good Pt will go Supine/Side to Sit: with supervision Pt will go Sit to Supine/Side: with supervision PT Goal: Sit to Supine/Side - Progress: Progressing toward goal Pt will go Sit to Stand: with supervision PT Goal: Sit to Stand - Progress: Progressing toward goal Pt will  go Stand to Sit: with supervision PT Goal: Stand to Sit - Progress: Progressing toward goal Pt will Ambulate: >150 feet;with supervision;with rolling walker PT Goal: Ambulate - Progress: Progressing toward goal Pt will Go Up / Down Stairs: Flight;with min assist;with least restrictive assistive device PT Goal: Up/Down Stairs - Progress: Goal set today  Visit Information  Last PT Received On: 07/05/11 Assistance Needed: +1    Subjective Data      Cognition  Overall Cognitive Status: Appears within functional limits for tasks assessed/performed Arousal/Alertness: Awake/alert Orientation Level: Appears intact for tasks assessed Behavior During Session: [facility] for tasks performed    Balance     End of Session PT - End of Session Equipment Utilized During Treatment: Right knee immobilizer Activity Tolerance: Patient tolerated treatment well Patient left: with call bell/phone within reach;with family/visitor present;in bed Nurse Communication: Mobility status    Amulya Quintin 07/05/2011, 2:16 PM

## 2011-07-05 NOTE — Evaluation (Signed)
Physical Therapy Evaluation Patient Details Name: Janet Wilson MRN: [id number] DOB: 1934-10-11 Today's Date: 07/05/2011 Time: 0935-1010 PT Time Calculation (min): 35 min  PT Assessment / Plan / Recommendation Clinical Impression  Pt with R TKR presents with decreased R LE strength/ROM and limited functional mobility    PT Assessment  Patient needs continued PT services    Follow Up Recommendations  Home health PT    Barriers to Discharge        lEquipment Recommendations  Rolling walker with 5" wheels    Recommendations for Other Services OT consult   Frequency 7X/week    Precautions / Restrictions Precautions Precautions: Knee Required Braces or Orthoses: Knee Immobilizer - Right Knee Immobilizer - Right: Discontinue once straight leg raise with < 10 degree lag Restrictions Weight Bearing Restrictions: No Other Position/Activity Restrictions: WBAT   Pertinent Vitals/Pain 5/10 - cold packs applied, pt premedicated      Mobility  Bed Mobility Details for Bed Mobility Assistance: cues for use of UEs to self assist Transfers Transfers: Sit to Stand;Stand to Sit Sit to Stand: 4: Min assist Stand to Sit: 4: Min assist Details for Transfer Assistance: cues for use of UEs to self assist and for LE management Ambulation/Gait Ambulation/Gait Assistance: 4: Min assist Ambulation Distance (Feet): 64 Feet Assistive device: Rolling walker Ambulation/Gait Assistance Details: cues for posture, sequence, position from RW and stride length Gait Pattern: Step-to pattern    Exercises Total Joint Exercises Ankle Circles/Pumps: AROM;10 reps;Supine;Both Quad Sets: AROM;10 reps;Both;Supine Heel Slides: AAROM;10 reps;Supine;Right Straight Leg Raises: AAROM;10 reps;Right;Supine   PT Diagnosis: Difficulty walking  PT Problem List:   PT Treatment Interventions: DME instruction;Gait training;Stair training;Therapeutic activities;Therapeutic exercise;Functional mobility  training;Patient/family education   PT Goals Acute Rehab PT Goals PT Goal Formulation: With patient Time For Goal Achievement: 07/10/11 Potential to Achieve Goals: Good Pt will go Supine/Side to Sit: with supervision PT Goal: Supine/Side to Sit - Progress: Goal set today Pt will go Sit to Supine/Side: with supervision PT Goal: Sit to Supine/Side - Progress: Goal set today Pt will go Sit to Stand: with supervision PT Goal: Sit to Stand - Progress: Goal set today Pt will go Stand to Sit: with supervision PT Goal: Stand to Sit - Progress: Goal set today Pt will Ambulate: >150 feet;with supervision;with rolling walker PT Goal: Ambulate - Progress: Goal set today Pt will Go Up / Down Stairs: Flight;with min assist;with least restrictive assistive device (2 steps without rails and ~9steps with single rail) PT Goal: Up/Down Stairs - Progress: Goal set today  Visit Information  Last PT Received On: 07/05/11 Assistance Needed: +1    Subjective Data  Subjective: I'm ready to move Patient Stated Goal: Resume previous lifestyle with decreaed pain   Prior Functioning  Home Living Lives With: Spouse Available Help at Discharge: Family Type of Home: House Home Access: Stairs to enter Secretary/administrator of Steps: 2 Entrance Stairs-Rails: None Home Layout: Two level Alternate Level Stairs-Number of Steps: 9 Alternate Level Stairs-Rails: Right Home Adaptive Equipment: Crutches Prior Function Level of Independence: Independent Able to Take Stairs?: Yes Driving: Yes Communication Communication: No difficulties    Cognition  Overall Cognitive Status: Appears within functional limits for tasks assessed/performed Arousal/Alertness: Awake/alert Orientation Level: Appears intact for tasks assessed Behavior During Session: [facility] for tasks performed    Extremity/Trunk Assessment Right Upper Extremity Assessment RUE ROM/Strength/Tone: Within functional levels Left Upper Extremity  Assessment LUE ROM/Strength/Tone: Within functional levels Right Lower Extremity Assessment RLE ROM/Strength/Tone: Deficits RLE ROM/Strength/Tone Deficits: 3-/5  quads; -10 - 80 AAROM at knee Left Lower Extremity Assessment LLE ROM/Strength/Tone: Within functional levels   Balance    End of Session PT - End of Session Equipment Utilized During Treatment: Right knee immobilizer Activity Tolerance: Patient tolerated treatment well Patient left: in chair;with call bell/phone within reach;with family/visitor present Nurse Communication: Mobility status   Janet Wilson 07/05/2011, 12:29 PM

## 2011-07-06 LAB — CBC
HCT: 26.1 % — ABNORMAL LOW (ref 36.0–46.0)
Hemoglobin: 8.6 g/dL — ABNORMAL LOW (ref 12.0–15.0)
MCH: 30.5 pg (ref 26.0–34.0)
MCHC: 33 g/dL (ref 30.0–36.0)
MCV: 92.6 fL (ref 78.0–100.0)
Platelets: 198 10*3/uL (ref 150–400)
RBC: 2.82 MIL/uL — ABNORMAL LOW (ref 3.87–5.11)
RDW: 12.7 % (ref 11.5–15.5)
WBC: 9.3 10*3/uL (ref 4.0–10.5)

## 2011-07-06 LAB — BASIC METABOLIC PANEL
BUN: 21 mg/dL (ref 6–23)
CO2: 25 mEq/L (ref 19–32)
Calcium: 8.4 mg/dL (ref 8.4–10.5)
Chloride: 105 mEq/L (ref 96–112)
Creatinine, Ser: 1.19 mg/dL — ABNORMAL HIGH (ref 0.50–1.10)
GFR calc Af Amer: 50 mL/min — ABNORMAL LOW (ref >90)
GFR calc non Af Amer: 44 mL/min — ABNORMAL LOW (ref >90)
Glucose, Bld: 115 mg/dL — ABNORMAL HIGH (ref 70–99)
Potassium: 3.9 mEq/L (ref 3.5–5.1)
Sodium: 139 mEq/L (ref 135–145)

## 2011-07-06 MED ORDER — METHOCARBAMOL 500 MG PO TABS: 500.0000 mg | ORAL_TABLET | Freq: Four times a day (QID) | ORAL | Status: AC | PRN

## 2011-07-06 MED ORDER — RIVAROXABAN 10 MG PO TABS: 10.0000 mg | ORAL_TABLET | Freq: Every day | ORAL | Status: DC

## 2011-07-06 MED ORDER — SODIUM CHLORIDE 0.9 % IV BOLUS (SEPSIS)
500.0000 mL | Freq: Once | INTRAVENOUS | Status: AC
  Administered 2011-07-06: 500 mL via INTRAVENOUS

## 2011-07-06 MED ORDER — POLYSACCHARIDE IRON COMPLEX 150 MG PO CAPS: 150.0000 mg | ORAL_CAPSULE | Freq: Every day | ORAL | Status: DC

## 2011-07-06 MED ORDER — OXYCODONE HCL 5 MG PO TABS: 5.0000 mg | ORAL_TABLET | ORAL | Status: AC | PRN

## 2011-07-06 NOTE — Care Management Note (Signed)
    Page 1 of 2   07/06/2011     3:11:10 PM   CARE MANAGEMENT NOTE 07/06/2011  Patient:  Janet Wilson, Janet Wilson   Account Number:  [id number]  Date Initiated:  07/06/2011  Documentation initiated by:  Colleen Can  Subjective/Objective Assessment:   dx total right knee replacemnt     Action/Plan:   CM spoke with patient and family. Plans are for [patient to return to her home in [place] where spouseand daughter will be caregivers. Pt will need RW and H services. Choice of HH agenciy offerd. Pt chose [facility]   Anticipated DC Date:  07/07/2011   Anticipated DC Plan:  HOME W HOME HEALTH SERVICES  In-house referral  NA      DC Planning Services  CM consult      [facility] Choice  HOME HEALTH  DURABLE MEDICAL EQUIPMENT   Choice offered to / List presented to:  C-1 Patient   DME arranged  Levan Hurst      DME agency  [facility]     HH arranged  HH-2 PT      [facility] agency  [facility]   Status of service:  Completed, signed off Medicare Important Message given?  NA - LOS <3 / Initial given by admissions (If response is "NO", the following Medicare IM given date fields will be blank) Date Medicare IM given:   Date Additional Medicare IM given:    Discharge Disposition:    Per UR Regulation:    If discussed at Long Length of Stay Meetings, dates discussed:    Comments:  07/06/2011 Raynelle Bring BSN CCM [phone] [facility] notified and had delivered RW. [facility] can provide services for HHpt and services will start on Sunday if discharged on Saturday. Face sheet, orders for [facility], op notes, H&P faxed to Vickie  at [phone]/confirmaqtion received.List of HH agencies placed in shadow chart.

## 2011-07-06 NOTE — Progress Notes (Signed)
Physical Therapy Treatment Patient Details Name: BRYANAH SIDELL MRN: [id number] DOB: [birth year] Today's Date: 07/06/2011 Time: 7829-5621 PT Time Calculation (min): 19 min  PT Assessment / Plan / Recommendation Comments on Treatment Session  Improvement in stability and mobility vs earlier this date but pt continues ltd by fatigue    Follow Up Recommendations  Home health PT    Barriers to Discharge        Equipment Recommendations  3 in 1 bedside comode;Rolling walker with 5" wheels    Recommendations for Other Services OT consult  Frequency 7X/week   Plan Discharge plan remains appropriate    Precautions / Restrictions Precautions Precautions: Fall;Knee Required Braces or Orthoses: Knee Immobilizer - Right Knee Immobilizer - Right: Discontinue once straight leg raise with < 10 degree lag Restrictions Weight Bearing Restrictions: No Other Position/Activity Restrictions: WBAT   Pertinent Vitals/Pain 3./10    Mobility  Bed Mobility Bed Mobility: Supine to Sit Supine to Sit: 4: Min guard Details for Bed Mobility Assistance: cues for use of UEs to self assist Transfers Transfers: Sit to Stand;Stand to Sit Sit to Stand: 4: Min guard;From chair/3-in-1;With upper extremity assist;With armrests Stand to Sit: 4: Min guard;With upper extremity assist;To chair/3-in-1;With armrests;To bed Details for Transfer Assistance: cues for use of UEs and for LE management Ambulation/Gait Ambulation/Gait Assistance: 4: Min guard Ambulation Distance (Feet): 64 Feet (64 and 18') Assistive device: Rolling walker Ambulation/Gait Assistance Details: cues for position from RW Gait Pattern: Step-to pattern    Exercises     PT Diagnosis:    PT Problem List:   PT Treatment Interventions:     PT Goals Acute Rehab PT Goals PT Goal Formulation: With patient Time For Goal Achievement: 07/10/11 Potential to Achieve Goals: Good Pt will go Supine/Side to Sit: with supervision PT Goal:  Supine/Side to Sit - Progress: Progressing toward goal Pt will go Sit to Supine/Side: with supervision PT Goal: Sit to Supine/Side - Progress: Progressing toward goal Pt will go Sit to Stand: with supervision PT Goal: Sit to Stand - Progress: Progressing toward goal Pt will go Stand to Sit: with supervision PT Goal: Stand to Sit - Progress: Progressing toward goal Pt will Ambulate: >150 feet;with supervision;with rolling walker PT Goal: Ambulate - Progress: Progressing toward goal  Visit Information  Last PT Received On: 07/06/11 Assistance Needed: +1    Subjective Data  Subjective: I'm going to be staying to tomorrow Patient Stated Goal: Resume previous lifestyle with decreaed pain   Cognition  Overall Cognitive Status: Appears within functional limits for tasks assessed/performed Arousal/Alertness: Awake/alert Orientation Level: Appears intact for tasks assessed Behavior During Session: [facility] for tasks performed    Balance     End of Session PT - End of Session Activity Tolerance: Patient limited by fatigue Patient left: in bed;with call bell/phone within reach;with family/visitor present Nurse Communication: Mobility status CPM Right Knee CPM Right Knee: On    Angeles Paolucci 07/06/2011, 3:22 PM

## 2011-07-06 NOTE — Discharge Summary (Signed)
Physician Discharge Summary   Patient ID: Janet Wilson MRN: [id number] DOB/AGE: August 02, 1934 77 y.o.  Admit date: 07/04/2011 Discharge date: 07/07/2011  Primary Diagnosis: Osteoarthritis Right Knee   Admission Diagnoses:  Past Medical History  Diagnosis Date  . Overactive bladder   . GERD (gastroesophageal reflux disease)   . Goiter     CAUSING COUGH, HOARSINESS AND DRY THROAT  . Headache     MIGRAINES - TAKES VERAPAMIL FOR PREVENTION OF MIGRAINES  . Arthritis     OA BOTH KNEES AND HANDS   Discharge Diagnoses:   Principal Problem:  *OA (osteoarthritis) of knee  Procedure:  Procedure(s) (LRB): TOTAL KNEE ARTHROPLASTY (Right)   Consults: None  HPI: Janet Wilson is a 77 y.o. year old female with end stage OA of her right knee with progressively worsening pain and dysfunction. She has constant pain, with activity and at rest and significant functional deficits with difficulties even with ADLs. She has had extensive non-op management including analgesics, injections of cortisone and viscosupplements, and home exercise program, but remains in significant pain with significant dysfunction.Radiographs show bone on bone arthritis lateral and patellofemoral with tibial subchondral sclerosis. She presents now for left Total Knee Arthroplasty.   Laboratory Data: Hospital Outpatient Visit on 06/27/2011  Component Date Value Range Status  . MRSA, PCR  06/27/2011 NEGATIVE  NEGATIVE Final  . Staphylococcus aureus  06/27/2011 NEGATIVE  NEGATIVE Final   Comment:                                 The Xpert SA Assay (FDA                          approved for NASAL specimens                          only), is one component of                          a comprehensive surveillance                          program.  It is not intended                          to diagnose infection nor to                          guide or monitor treatment.  Marland Kitchen aPTT (seconds) 06/27/2011 32  24-37  Final  . WBC (K/uL) 06/27/2011 7.5  4.0-10.5 Final  . RBC (MIL/uL) 06/27/2011 4.08  3.87-5.11 Final  . Hemoglobin (g/dL) 78/29/5621 30.8  65.7-84.6 Final  . HCT (%) 06/27/2011 37.9  36.0-46.0 Final  . MCV (fL) 06/27/2011 92.9  78.0-100.0 Final  . MCH (pg) 06/27/2011 30.9  26.0-34.0 Final  . MCHC (g/dL) 96/29/5284 13.2  44.0-10.2 Final  . RDW (%) 06/27/2011 12.5  11.5-15.5 Final  . Platelets (K/uL) 06/27/2011 381  150-400 Final  . Sodium (mEq/L) 06/27/2011 140  135-145 Final  . Potassium (mEq/L) 06/27/2011 3.8  3.5-5.1 Final  . Chloride (mEq/L) 06/27/2011 102  96-112 Final  . CO2 (mEq/L) 06/27/2011 28  19-32 Final  . Glucose, Bld (mg/dL) 72/53/6644 91  03-47 Final  . BUN (mg/dL)  06/27/2011 36* 6-23 Final  . Creatinine, Ser (mg/dL) 78/29/5621 3.08* 6.57-8.46 Final  . Calcium (mg/dL) 96/29/5284 9.1  1.3-24.4 Final  . Total Protein (g/dL) 02/14/7251 7.6  6.6-4.4 Final  . Albumin (g/dL) 03/47/4259 3.6  5.6-3.8 Final  . AST (U/L) 06/27/2011 14  0-37 Final  . ALT (U/L) 06/27/2011 15  0-35 Final  . Alkaline Phosphatase (U/L) 06/27/2011 80  39-117 Final  . Total Bilirubin (mg/dL) 75/64/3329 0.4  5.1-8.8 Final  . GFR calc non Af Amer (mL/min) 06/27/2011 37* >90 Final  . GFR calc Af Amer (mL/min) 06/27/2011 43* >90 Final   Comment:                                 The eGFR has been calculated                          using the CKD EPI equation.                          This calculation has not been                          validated in all clinical                          situations.                          eGFR's persistently                          <90 mL/min signify                          possible Chronic Kidney Disease.  Marland Kitchen Prothrombin Time (seconds) 06/27/2011 13.7  11.6-15.2 Final  . INR  06/27/2011 1.03  0.00-1.49 Final  . Color, Urine  06/27/2011 YELLOW  YELLOW Final  . APPearance  06/27/2011 CLEAR  CLEAR Final  . Specific Gravity, Urine  06/27/2011 1.016  1.005-1.030 Final  .  pH  06/27/2011 6.0  5.0-8.0 Final  . Glucose, UA (mg/dL) 41/66/0630 NEGATIVE  NEGATIVE Final  . Hgb urine dipstick  06/27/2011 NEGATIVE  NEGATIVE Final  . Bilirubin Urine  06/27/2011 NEGATIVE  NEGATIVE Final  . Ketones, ur (mg/dL) 16/02/930 NEGATIVE  NEGATIVE Final  . Protein, ur (mg/dL) 35/57/3220 NEGATIVE  NEGATIVE Final  . Urobilinogen, UA (mg/dL) 25/42/7062 0.2  3.7-6.2 Final  . Nitrite  06/27/2011 NEGATIVE  NEGATIVE Final  . Leukocytes, UA  06/27/2011 TRACE* NEGATIVE Final  . Squamous Epithelial / LPF  06/27/2011 RARE  RARE Final  . WBC, UA (WBC/hpf) 06/27/2011 3-6  <3 Final  . Bacteria, UA  06/27/2011 FEW* RARE Final    Basename 07/06/11 0424 07/05/11 0418  HGB 8.6* 9.0*    Basename 07/06/11 0424 07/05/11 0418  WBC 9.3 9.9  RBC 2.82* 2.96*  HCT 26.1* 27.2*  PLT 198 229    Basename 07/06/11 0424 07/05/11 0418  NA 139 138  K 3.9 3.9  CL 105 105  CO2 25 25  BUN 21 25*  CREATININE 1.19* 1.23*  GLUCOSE 115* 151*  CALCIUM 8.4 8.1*   No results found for this basename: LABPT:2,INR:2 in the last 72 hours  X-Rays:Us Thyroid Biopsy  06/14/2011  *RADIOLOGY REPORT*  Clinical Data:   Patient with history of thyroid ultrasound at [facility] on 05/31/2011 which revealed multiple thyroid nodules consistent with multinodular goiter.  There is a solid nodule in the mid left lobe measuring 3.0 x 1.3 x 2.3 cm . In addition there is a dominant nodule in the lower pole of the right lobe measuring 3.0 x 1.8 x 2.1 cm.  Request is now made for needle aspirate biopsies of both right and left dominant thyroid nodules.  ULTRASOUND GUIDED NEEDLE ASPIRATE BIOPSY ,DOMINANT RIGHT MID TO LOWER POLE THYROID  NODULE  Thyroid biopsy was thoroughly discussed with the patient and questions were answered.  The benefits, risks, alternatives, and complications were also discussed.  The patient understands and wishes to proceed with the procedure.  Written consent was obtained.  Ultrasound was performed to  localize and mark an adequate site for the biopsy.  The patient was then prepped and draped in a normal sterile fashion.  Local anesthesia was provided with 1% lidocaine. Using direct ultrasound guidance, 3 passes were made using 25 gauge needles into the nodule within the mid to lower right lobe of the thyroid.  Ultrasound was used to confirm needle placements on all occasions.  Specimens were sent to Pathology for analysis.  Complications:  none  ULTRASOUND GUIDED NEEDLE ASPIRATE BIOPSY, DOMINANT LEFT MID LOBE THYROID NODULE  Ultrasound was performed to localize and mark an adequate site for the biopsy.  The patient was then prepped and draped in a normal sterile fashion.  Local anesthesia was provided with 1% lidocaine. Under direct ultrasound guidance, three passes were made using 25 gauge needles into the dominant nodule within the mid left lobe of the thyroid. Ultrasound was used to confirm needle placements on all occasions.  Specimens were sent to pathology for analysis.  IMPRESSION: Successful ultrasound guided needle aspirate biopsies of both right and left dominant thyroid nodules.  Final pathology pending.  Read by: Jeananne Rama, P.A.-C  Original Report Authenticated By: Donavan Burnet, M.D.   US Thyroid Biopsy  06/13/2011  *RADIOLOGY REPORT*  Clinical Data:   Patient with history of thyroid ultrasound at [facility] on 05/31/2011 which revealed multiple thyroid nodules consistent with multinodular goiter.  There is a solid nodule in the mid left lobe measuring 3.0 x 1.3 x 2.3 cm . In addition there is a dominant nodule in the lower pole of the right lobe measuring 3.0 x 1.8 x 2.1 cm.  Request is now made for needle aspirate biopsies of both right and left dominant thyroid nodules.  ULTRASOUND GUIDED NEEDLE ASPIRATE BIOPSY ,DOMINANT RIGHT MID TO LOWER POLE THYROID  NODULE  Thyroid biopsy was thoroughly discussed with the patient and questions were answered.  The benefits, risks, alternatives,  and complications were also discussed.  The patient understands and wishes to proceed with the procedure.  Written consent was obtained.  Ultrasound was performed to localize and mark an adequate site for the biopsy.  The patient was then prepped and draped in a normal sterile fashion.  Local anesthesia was provided with 1% lidocaine. Using direct ultrasound guidance, 3 passes were made using 25 gauge needles into the nodule within the mid to lower right lobe of the thyroid.  Ultrasound was used to confirm needle placements on all occasions.  Specimens were sent to Pathology for analysis.  Complications:  none  ULTRASOUND GUIDED NEEDLE ASPIRATE BIOPSY, DOMINANT LEFT MID LOBE THYROID NODULE  Ultrasound was  performed to localize and mark an adequate site for the biopsy.  The patient was then prepped and draped in a normal sterile fashion.  Local anesthesia was provided with 1% lidocaine. Under direct ultrasound guidance, three passes were made using 25 gauge needles into the dominant nodule within the mid left lobe of the thyroid. Ultrasound was used to confirm needle placements on all occasions.  Specimens were sent to pathology for analysis.  IMPRESSION: Successful ultrasound guided needle aspirate biopsies of both right and left dominant thyroid nodules.  Final pathology pending.  Read by: Jeananne Rama, P.A.-C  Original Report Authenticated By: Donavan Burnet, M.D.    EKG: Orders placed during the hospital encounter of 06/27/11  . EKG 12-LEAD  . EKG 12-LEAD     Hospital Course: Patient was admitted to [facility] and taken to the OR and underwent the above state procedure without complications.  Patient tolerated the procedure well and was later transferred to the recovery room and then to the orthopaedic floor for postoperative care.  They were given PO and IV analgesics for pain control following their surgery.  They were given 24 hours of postoperative antibiotics and started on DVT  prophylaxis in the form of Xarelto.   PT and OT were ordered for total joint protocol.  Discharge planning consulted to help with postop disposition and equipment needs.  Patient had a decent night on the evening of surgery and started to get up OOB with therapy on day one.  PCA Morphine was discontinued and they were weaned over to PO meds.  Hemovac drain was pulled without difficulty.  Continued to work with therapy into day two.  Dressing was changed on day two and the incision was healing well.  By day three, the patient had progressed with therapy and meeting their goals.  Incision was healing well.  Patient was seen in rounds and was ready to go home.  Discharge Medications: Prior to Admission medications   Medication Sig Start Date End Date Taking? Authorizing Provider  diazepam (VALIUM) 5 MG tablet Take 2.5 mg by mouth at bedtime.   Yes Historical Provider, MD  omeprazole (PRILOSEC) 20 MG capsule Take 20 mg by mouth daily with breakfast.   Yes Historical Provider, MD  verapamil (COVERA HS) 180 MG (CO) 24 hr tablet Take 120 mg by mouth 1 day or 1 dose.    Yes Historical Provider, MD  iron polysaccharides (NIFEREX) 150 MG capsule Take 1 capsule (150 mg total) by mouth daily. 07/06/11 07/05/12  Carey Johndrow, PA  magnesium oxide (MAG-OX) 400 MG tablet Take 400 mg by mouth daily with breakfast.    Historical Provider, MD  methocarbamol (ROBAXIN) 500 MG tablet Take 1 tablet (500 mg total) by mouth every 6 (six) hours as needed. 07/06/11 07/16/11  Blase Beckner, PA  nitrofurantoin, macrocrystal-monohydrate, (MACROBID) 100 MG capsule Take 100 mg by mouth 2 (two) times daily as needed. For UTI    Historical Provider, MD  oxyCODONE (OXY IR/ROXICODONE) 5 MG immediate release tablet Take 1-2 tablets (5-10 mg total) by mouth every 4 (four) hours as needed for pain. 07/06/11 07/16/11  Kennis Buell Julien Girt, PA  rivaroxaban (XARELTO) 10 MG TABS tablet Take 1 tablet (10 mg total) by mouth daily with  breakfast. Take for two and a half more weeks, then discontinue Xarelto. Once the patient has completed the Xarelto, they may resume the 81 mg Aspirin. 07/06/11   Abie Cheek Julien Girt, PA  zolmitriptan (ZOMIG) 5 MG tablet Take 5 mg  by mouth daily as needed. For migraine    Historical Provider, MD    Diet: Regular diet Activity:WBAT Follow-up:in 2 weeks Disposition - Home Discharged Condition: good    Medication List  As of 07/06/2011 10:13 AM   STOP taking these medications         aspirin 81 MG chewable tablet      Biotin 5000 MCG Tabs      cholecalciferol 1000 UNITS tablet      CITRACAL CALCIUM+D PO      Co Q 10 10 MG Caps      mulitivitamin with minerals Tabs      omega-3 acid ethyl esters 1 G capsule      PROBIOTIC ACIDOPHILUS PO         TAKE these medications         diazepam 5 MG tablet   Commonly known as: VALIUM   Take 2.5 mg by mouth at bedtime.      iron polysaccharides 150 MG capsule   Commonly known as: NIFEREX   Take 1 capsule (150 mg total) by mouth daily.      magnesium oxide 400 MG tablet   Commonly known as: MAG-OX   Take 400 mg by mouth daily with breakfast.      methocarbamol 500 MG tablet   Commonly known as: ROBAXIN   Take 1 tablet (500 mg total) by mouth every 6 (six) hours as needed.      nitrofurantoin (macrocrystal-monohydrate) 100 MG capsule   Commonly known as: MACROBID   Take 100 mg by mouth 2 (two) times daily as needed. For UTI      omeprazole 20 MG capsule   Commonly known as: PRILOSEC   Take 20 mg by mouth daily with breakfast.      oxyCODONE 5 MG immediate release tablet   Commonly known as: Oxy IR/ROXICODONE   Take 1-2 tablets (5-10 mg total) by mouth every 4 (four) hours as needed for pain.      rivaroxaban 10 MG Tabs tablet   Commonly known as: XARELTO   Take 1 tablet (10 mg total) by mouth daily with breakfast. Take for two and a half more weeks, then discontinue Xarelto.  Once the patient has completed the Xarelto,  they may resume the 81 mg Aspirin.      verapamil 180 MG (CO) 24 hr tablet   Commonly known as: COVERA HS   Take 120 mg by mouth 1 day or 1 dose.      zolmitriptan 5 MG tablet   Commonly known as: ZOMIG   Take 5 mg by mouth daily as needed. For migraine           Follow-up Information    Follow up with Loanne Drilling, MD. Schedule an appointment as soon as possible for a visit in 2 weeks.   Contact information:   [facility] [address] [place] [address] [phone]          Signed: Patrica Duel 07/06/2011, 10:13 AM

## 2011-07-06 NOTE — Progress Notes (Signed)
Physical Therapy Treatment Patient Details Name: Janet Wilson MRN: [id number] DOB: 1934/07/26 Today's Date: 07/06/2011 Time: 7846-9629 PT Time Calculation (min): 12 min  PT Assessment / Plan / Recommendation Comments on Treatment Session       Follow Up Recommendations  Home health PT    Barriers to Discharge        Equipment Recommendations  3 in 1 bedside comode;Rolling walker with 5" wheels    Recommendations for Other Services OT consult  Frequency 7X/week   Plan Discharge plan remains appropriate    Precautions / Restrictions Precautions Precautions: Fall;Knee Required Braces or Orthoses: Knee Immobilizer - Right Knee Immobilizer - Right: Discontinue once straight leg raise with < 10 degree lag Restrictions Weight Bearing Restrictions: No Other Position/Activity Restrictions: WBAT   Pertinent Vitals/Pain 6/10 with activity    Mobility  Transfers Transfers: Sit to Stand;Stand to Sit Sit to Stand: 4: Min assist;From chair/3-in-1 Stand to Sit: 4: Min assist;With upper extremity assist;To chair/3-in-1 Details for Transfer Assistance: cues for L LE against chair and use of UEs to assist Ambulation/Gait Ambulation/Gait Assistance: 4: Min assist Ambulation Distance (Feet): 18 Feet (18'x2) Assistive device: Rolling walker Ambulation/Gait Assistance Details: cues for position from RW and posture Gait Pattern: Step-to pattern    Exercises     PT Diagnosis:    PT Problem List:   PT Treatment Interventions:     PT Goals Acute Rehab PT Goals Time For Goal Achievement: 07/10/11 Potential to Achieve Goals: Good Pt will go Sit to Stand: with supervision PT Goal: Sit to Stand - Progress: Progressing toward goal Pt will go Stand to Sit: with supervision PT Goal: Stand to Sit - Progress: Progressing toward goal Pt will Ambulate: >150 feet;with supervision;with rolling walker PT Goal: Ambulate - Progress: Progressing toward goal  Visit Information  Last PT  Received On: 07/06/11 Assistance Needed: +1    Subjective Data  Subjective: I think I need some pain mediciine Patient Stated Goal: Resume previous lifestyle with decreaed pain   Cognition  Overall Cognitive Status: Appears within functional limits for tasks assessed/performed Arousal/Alertness: Awake/alert Orientation Level: Appears intact for tasks assessed Behavior During Session: [facility] for tasks performed    Balance     End of Session PT - End of Session Activity Tolerance: Patient limited by pain Patient left: with call bell/phone within reach;with family/visitor present;in bed Nurse Communication: Other (comment) (Pain meds requested)    Stefani Baik 07/06/2011, 2:04 PM

## 2011-07-06 NOTE — Evaluation (Signed)
Occupational Therapy Evaluation Patient Details Name: Janet Wilson MRN: [id number] DOB: [birth year] Today's Date: 07/06/2011 Time: 1130-1203 OT Time Calculation (min): 33 min  OT Assessment / Plan / Recommendation Clinical Impression  Pt is a 77 year old woman recovering from R TKA.  Pt is progressing well in mobility and ADL.  Instruction provided in DME and AE.  Pt will need a 3 in1 for home.  She will discuss with her husband whether she wants to order a tub bench from the hospital.    OT Assessment  Patient does not need any further OT services    Follow Up Recommendations  Supervision - Intermittent;No OT follow up    Barriers to Discharge      Equipment Recommendations  3 in 1 bedside comode;Rolling walker with 5" wheels    Recommendations for Other Services    Frequency       Precautions / Restrictions Precautions Precautions: Fall;Knee Required Braces or Orthoses: Knee Immobilizer - Right Knee Immobilizer - Right: Discontinue once straight leg raise with < 10 degree lag Restrictions Weight Bearing Restrictions: No Other Position/Activity Restrictions: WBAT   Pertinent Vitals/Pain    ADL  Eating/Feeding: Performed;Independent Where Assessed - Eating/Feeding: Chair Grooming: Performed;Wash/dry hands;Min guard Where Assessed - Grooming: Supported standing Upper Body Bathing: Simulated;Set up Where Assessed - Upper Body Bathing: Unsupported sitting Lower Body Bathing: Simulated;Minimal assistance Where Assessed - Lower Body Bathing: Supported sit to stand;Unsupported sitting Upper Body Dressing: Simulated;Set up Where Assessed - Upper Body Dressing: Unsupported sitting Lower Body Dressing: Performed;Minimal assistance Where Assessed - Lower Body Dressing: Unsupported sitting;Sopported sit to stand Toilet Transfer: Mining engineer Method: Other (comment) (ambulating) Equipment Used: Long-handled sponge;Long-handled shoe  horn;Reacher;Rolling walker;Sock aid;Gait belt;Other (comment) (tub bench) Transfers/Ambulation Related to ADLs: min guard assist for ambulation with RW. ADL Comments: Pt will likely stay on bottom floor of her home and use the tub/shower.  Pt shown tub transfer bench and instructed where she may purchase or may be ordered from the hospital.  Also instructed pt in use of AE and multiple uses of 3 in 1 as pt has a walkin shower also and has urinary incontinence at night particularly.    OT Diagnosis:    OT Problem List:   OT Treatment Interventions:     OT Goals    Visit Information  Last OT Received On: 07/06/11 Assistance Needed: +1    Subjective Data  Subjective: I like to take showers every day. Patient Stated Goal: Home with assist of her husband.   Prior Functioning  Home Living Lives With: Spouse Available Help at Discharge: Family Type of Home: House Home Access: Stairs to enter Secretary/administrator of Steps: 2 Entrance Stairs-Rails: None Home Layout: Two level Alternate Level Stairs-Number of Steps: 9 Alternate Level Stairs-Rails: Right Bathroom Shower/Tub: Tub/shower unit;Walk-in shower (tub shower unit is on first floor) Bathroom Toilet: Standard Home Adaptive Equipment: Crutches;Hand-held shower hose Prior Function Level of Independence: Independent Able to Take Stairs?: Yes Driving: Yes Communication Communication: No difficulties Dominant Hand: Right    Cognition  Overall Cognitive Status: Appears within functional limits for tasks assessed/performed Arousal/Alertness: Awake/alert Orientation Level: Appears intact for tasks assessed Behavior During Session: [facility] for tasks performed    Extremity/Trunk Assessment Right Upper Extremity Assessment RUE ROM/Strength/Tone: Within functional levels Left Upper Extremity Assessment LUE ROM/Strength/Tone: Within functional levels   Mobility Transfers Sit to Stand: 4: Min assist;From chair/3-in-1 Stand to Sit:  4: Min guard;To chair/3-in-1 Details for Transfer Assistance: verbal cues for  technique   Exercise    Balance    End of Session OT - End of Session Activity Tolerance: Patient tolerated treatment well Patient left: in chair;with call bell/phone within reach   Evern Bio 07/06/2011, 12:30 PM [phone]

## 2011-07-06 NOTE — Discharge Instructions (Signed)
Dr. Gaynelle Arabian Total Joint Specialist [facility] [address], [place], [place] [address] [phone]  TOTAL KNEE REPLACEMENT POSTOPERATIVE DIRECTIONS    Knee Rehabilitation, Guidelines Following Surgery  Results after knee surgery are often greatly improved when you follow the exercise, range of motion and muscle strengthening exercises prescribed by your doctor. Safety measures are also important to protect the knee from further injury. Any time any of these exercises cause you to have increased pain or swelling in your knee joint, decrease the amount until you are comfortable again and slowly increase them. If you have problems or questions, call your caregiver or physical therapist for advice.   HOME CARE INSTRUCTIONS  Remove items at home which could result in a fall. This includes throw rugs or furniture in walking pathways.  Continue medications as instructed at time of discharge. You may have some home medications which will be placed on hold until you complete the course of blood thinner medication.  You may start showering once you are discharged home but do not submerge the incision under water.  Walk with walker as instructed.   Use walker as long as suggested by your caregivers.  Avoid periods of inactivity such as sitting longer than an hour when not asleep. This helps prevent blood clots.  You may put full weight on your legs and walk as much as is comfortable.  You may resume a sexual relationship in one month or when given the OK by your doctor.  You may return to work once you are cleared by your doctor.  Do not drive a car for 6 weeks or until released by you surgeon.   Do not drive while taking narcotics.  Wear the elastic stockings for three weeks following surgery during the day but you may remove then at night. Make sure you keep all of your appointments after your operation with all of your doctors and caregivers. You should call  the office at the above phone number and make an appointment for approximately two weeks after the date of your surgery. Change the dressing daily and reapply a dry dressing each time. Please pick up a stool softener and laxative for home use as long as you are requiring pain medications. Continue to use ice on the knee for pain and swelling from surgery. It is important for you to complete the blood thinner medication as prescribed by your doctor.  RANGE OF MOTION AND STRENGTHENING EXERCISES  Rehabilitation of the knee is important following a knee injury or an operation. After just a few days of immobilization, the muscles of the thigh which control the knee become weakened and shrink (atrophy). Knee exercises are designed to build up the tone and strength of the thigh muscles and to improve knee motion. Often times heat used for twenty to thirty minutes before working out will loosen up your tissues and help with improving the range of motion but do not use heat for the first two weeks following surgery. These exercises can be done on a training (exercise) mat, on the floor, on a table or on a bed. Use what ever works the best and is most comfortable for you Knee exercises include:  Leg Lifts - While your knee is still immobilized in a splint or cast, you can do straight leg raises. Lift the leg to 60 degrees, hold for 3 sec, and slowly lower the leg. Repeat 10-20 times 2-3 times daily. Perform this exercise against resistance later as your  knee gets better.  Quad and Hamstring Sets - Tighten up the muscle on the front of the thigh (Quad) and hold for 5-10 sec. Repeat this 10-20 times hourly. Hamstring sets are done by pushing the foot backward against an object and holding for 5-10 sec. Repeat as with quad sets.  A rehabilitation program following serious knee injuries can speed recovery and prevent re-injury in the future due to weakened muscles. Contact your doctor or a physical therapist for more  information on knee rehabilitation.   SKILLED REHAB INSTRUCTIONS: If the patient is transferred to a skilled rehab facility following release from the hospital, a list of the current medications will be sent to the facility for the patient to continue.  When discharged from the skilled rehab facility, please have the facility set up the patient's Home Health Physical Therapy prior to being released. Also, the skilled facility will be responsible for providing the patient with their medications at time of release from the facility to include their pain medication, the muscle relaxants, and their blood thinner medication. If the patient is still at the rehab facility at time of the two week follow up appointment, the skilled rehab facility will also need to assist the patient in arranging follow up appointment in our office and any transportation needs.  MAKE SURE YOU:  Understand these instructions.  Will watch your condition.  Will get help right away if you are not doing well or get worse.  Document Released: 01/29/2005 Document Revised: 01/18/2011 Document Reviewed: 07/19/2006  [facility] Patient Information 2012 [place], [place].   Take for two and a half more weeks, then discontinue Xarelto. Once the patient has completed the Xarelto, they may resume the 81 mg Aspirin.

## 2011-07-06 NOTE — Progress Notes (Signed)
Physical Therapy Treatment Patient Details Name: Janet Wilson MRN: [id number] DOB: 1934-04-20 Today's Date: 07/06/2011 Time: 4098-1191 PT Time Calculation (min): 41 min  PT Assessment / Plan / Recommendation Comments on Treatment Session       Follow Up Recommendations  Home health PT    Barriers to Discharge        Equipment Recommendations  3 in 1 bedside comode;Rolling walker with 5" wheels    Recommendations for Other Services OT consult  Frequency 7X/week   Plan Discharge plan remains appropriate    Precautions / Restrictions Precautions Precautions: Fall;Knee Required Braces or Orthoses: Knee Immobilizer - Right Knee Immobilizer - Right: Discontinue once straight leg raise with < 10 degree lag Restrictions Weight Bearing Restrictions: No Other Position/Activity Restrictions: WBAT   Pertinent Vitals/Pain 5/10    Mobility  Bed Mobility Bed Mobility: Supine to Sit Supine to Sit: 4: Min guard Details for Bed Mobility Assistance: cues for use of UEs to self assist Transfers Transfers: Sit to Stand;Stand to Sit Sit to Stand: 4: Min assist;From chair/3-in-1 Stand to Sit: 4: Min guard;To chair/3-in-1 Details for Transfer Assistance: verbal cues for technique Ambulation/Gait Ambulation/Gait Assistance: 4: Min guard;4: Min Environmental consultant (Feet): 35 Feet Assistive device: Rolling walker Ambulation/Gait Assistance Details: min cues for sequence and position from RW - ltd by onset of dizziness - BP 130/84 - RN and PA aware Gait Pattern: Step-to pattern    Exercises Total Joint Exercises Ankle Circles/Pumps: 20 reps;AROM;Both Quad Sets: Both;20 reps;Supine;AROM Short Arc Quad: AAROM;AROM;10 reps;Supine;Both Heel Slides: AAROM;20 reps;Supine;Right Straight Leg Raises: AROM;AAROM;20 reps;Right;Supine Goniometric ROM: -10 - 95 AAROM    PT Diagnosis:    PT Problem List:   PT Treatment Interventions:     PT Goals Acute Rehab PT Goals PT Goal  Formulation: With patient Time For Goal Achievement: 07/10/11 Potential to Achieve Goals: Good Pt will go Supine/Side to Sit: with supervision PT Goal: Supine/Side to Sit - Progress: Progressing toward goal Pt will go Sit to Supine/Side: with supervision PT Goal: Sit to Supine/Side - Progress: Progressing toward goal Pt will go Sit to Stand: with supervision PT Goal: Sit to Stand - Progress: Progressing toward goal Pt will go Stand to Sit: with supervision PT Goal: Stand to Sit - Progress: Progressing toward goal Pt will Ambulate: >150 feet;with supervision;with rolling walker  Visit Information  Last PT Received On: 07/06/11 Assistance Needed: +1    Subjective Data  Subjective: I feel dizzy (after ambulating 35') Patient Stated Goal: Resume previous lifestyle with decreaed pain   Cognition  Overall Cognitive Status: Appears within functional limits for tasks assessed/performed Arousal/Alertness: Awake/alert Orientation Level: Appears intact for tasks assessed Behavior During Session: [facility] for tasks performed    Balance     End of Session PT - End of Session Activity Tolerance: Other (comment) (LTd by dizziness with ambulation) Patient left: with call bell/phone within reach;with family/visitor present;in bed Nurse Communication: Mobility status    Fe Okubo 07/06/2011, 12:31 PM

## 2011-07-06 NOTE — Progress Notes (Signed)
Subjective: 2 Days Post-Op Procedure(s) (LRB): TOTAL KNEE ARTHROPLASTY (Right) Patient reports pain as mild and moderate.  She is not feeling as good today as she was yesterday.  In more pain today likely due to having done so well  Yesterday with therapy.  Will keep until tomorrow unless she does really well today and wants to go hoe later this evening.  Will set up for discharge for tomorrow but will be ready for her if wants to go later today. Patient seen in rounds for Dr. Lequita Halt. Patient is well, but has had some minor complaints of pain in the knee, requiring pain medications Plan is to go Home after hospital stay.  Objective: Vital signs in last 24 hours: Temp:  [97.8 F (36.6 C)-99.1 F (37.3 C)] 99.1 F (37.3 C) (05/24 0545) Pulse Rate:  [93-111] 110  (05/24 0545) Resp:  [14-16] 16  (05/24 0545) BP: (106-125)/(69-77) 113/70 mmHg (05/24 0545) SpO2:  [96 %-100 %] 96 % (05/24 0545)  Intake/Output from previous day:  Intake/Output Summary (Last 24 hours) at 07/06/11 1003 Last data filed at 07/06/11 0900  Gross per 24 hour  Intake    600 ml  Output   2450 ml  Net  -1850 ml    Intake/Output this shift: Total I/O In: -  Out: 200 [Urine:200]  Labs:  [facility] 07/06/11 0424 07/05/11 0418  HGB 8.6* 9.0*    Basename 07/06/11 0424 07/05/11 0418  WBC 9.3 9.9  RBC 2.82* 2.96*  HCT 26.1* 27.2*  PLT 198 229    Basename 07/06/11 0424 07/05/11 0418  NA 139 138  K 3.9 3.9  CL 105 105  CO2 25 25  BUN 21 25*  CREATININE 1.19* 1.23*  GLUCOSE 115* 151*  CALCIUM 8.4 8.1*   No results found for this basename: LABPT:2,INR:2 in the last 72 hours  EXAM General - Patient is Alert, Appropriate and Oriented Extremity - Neurovascular intact Sensation intact distally Dorsiflexion/Plantar flexion intact Dressing/Incision - clean, dry, no drainage, healing Motor Function - intact, moving foot and toes well on exam.   Past Medical History  Diagnosis Date  . Overactive bladder    . GERD (gastroesophageal reflux disease)   . Goiter     CAUSING COUGH, HOARSINESS AND DRY THROAT  . Headache     MIGRAINES - TAKES VERAPAMIL FOR PREVENTION OF MIGRAINES  . Arthritis     OA BOTH KNEES AND HANDS    Assessment/Plan: 2 Days Post-Op Procedure(s) (LRB): TOTAL KNEE ARTHROPLASTY (Right) Principal Problem:  *OA (osteoarthritis) of knee   Up with therapy Plan for discharge tomorrow Discharge home with home health  DVT Prophylaxis - Xarelto Weight-Bearing as tolerated to right leg  Mizraim Harmening 07/06/2011, 10:03 AM

## 2011-07-07 LAB — CBC
HCT: 25 % — ABNORMAL LOW (ref 36.0–46.0)
Hemoglobin: 8.2 g/dL — ABNORMAL LOW (ref 12.0–15.0)
MCH: 30.5 pg (ref 26.0–34.0)
MCHC: 32.8 g/dL (ref 30.0–36.0)
MCV: 92.9 fL (ref 78.0–100.0)
Platelets: 170 10*3/uL (ref 150–400)
RBC: 2.69 MIL/uL — ABNORMAL LOW (ref 3.87–5.11)
RDW: 12.8 % (ref 11.5–15.5)
WBC: 8.7 10*3/uL (ref 4.0–10.5)

## 2011-07-07 LAB — BASIC METABOLIC PANEL
BUN: 16 mg/dL (ref 6–23)
CO2: 26 mEq/L (ref 19–32)
Calcium: 8.3 mg/dL — ABNORMAL LOW (ref 8.4–10.5)
Chloride: 102 mEq/L (ref 96–112)
Creatinine, Ser: 1.09 mg/dL (ref 0.50–1.10)
GFR calc Af Amer: 56 mL/min — ABNORMAL LOW (ref >90)
GFR calc non Af Amer: 48 mL/min — ABNORMAL LOW (ref >90)
Glucose, Bld: 108 mg/dL — ABNORMAL HIGH (ref 70–99)
Potassium: 3.3 mEq/L — ABNORMAL LOW (ref 3.5–5.1)
Sodium: 137 mEq/L (ref 135–145)

## 2011-07-07 NOTE — Progress Notes (Signed)
11:00am Pt discharged to home with family.  Discharge instructions and prescriptions reviewed with patient and her family. Both state they understand instructions.  Pt taking to discharge area via wheel chair.  Wynonia Lawman, RN

## 2011-07-07 NOTE — Progress Notes (Signed)
Subjective: 3 Days Post-Op Procedure(s) (LRB): TOTAL KNEE ARTHROPLASTY (Right) Patient reports pain as mild.   Pt ready to go home.  Objective: Vital signs in last 24 hours: Temp:  [98.7 F (37.1 C)-99.6 F (37.6 C)] 98.7 F (37.1 C) (05/25 0604) Pulse Rate:  [70-115] 111  (05/25 0604) Resp:  [16] 16  (05/25 0604) BP: (95-145)/(58-80) 145/79 mmHg (05/25 0604) SpO2:  [96 %-97 %] 97 % (05/25 0604)  Intake/Output from previous day: 05/24 0701 - 05/25 0700 In: 480 [P.O.:480] Out: 2050 [Urine:2050] Intake/Output this shift:     Basename 07/07/11 0429 07/06/11 0424 07/05/11 0418  HGB 8.2* 8.6* 9.0*    Basename 07/07/11 0429 07/06/11 0424  WBC 8.7 9.3  RBC 2.69* 2.82*  HCT 25.0* 26.1*  PLT 170 198    Basename 07/07/11 0429 07/06/11 0424  NA 137 139  K 3.3* 3.9  CL 102 105  CO2 26 25  BUN 16 21  CREATININE 1.09 1.19*  GLUCOSE 108* 115*  CALCIUM 8.3* 8.4    knee wound dressed and dry.  Assessment/Plan: 3 Days Post-Op Procedure(s) (LRB): TOTAL KNEE ARTHROPLASTY (Right) Discharge home with home health  Janet Wilson 07/07/2011, 7:44 AM

## 2011-07-07 NOTE — Progress Notes (Signed)
Physical Therapy Treatment Patient Details Name: Janet Wilson MRN: [id number] DOB: [birth year] Today's Date: 07/07/2011 Time: 4098-1191 PT Time Calculation (min): 24 min  PT Assessment / Plan / Recommendation Comments on Treatment Session  pt eager for d/c    Follow Up Recommendations  Home health PT    Barriers to Discharge        Equipment Recommendations  3 in 1 bedside comode;Rolling walker with 5" wheels    Recommendations for Other Services OT consult  Frequency 7X/week   Plan Discharge plan remains appropriate    Precautions / Restrictions Precautions Precautions: Fall;Knee Required Braces or Orthoses: Knee Immobilizer - Right Knee Immobilizer - Right: Discontinue once straight leg raise with < 10 degree lag Restrictions Weight Bearing Restrictions: Yes Other Position/Activity Restrictions: WBAT   Pertinent Vitals/Pain     Mobility  Bed Mobility Bed Mobility: Supine to Sit;Sit to Supine Supine to Sit: 4: Min guard Sit to Supine: 4: Min guard Details for Bed Mobility Assistance: cues for use of UEs to self assist Transfers Transfers: Sit to Stand;Stand to Sit Sit to Stand: 4: Min guard Stand to Sit: 4: Min guard Details for Transfer Assistance: cues for position from chair Ambulation/Gait Ambulation/Gait Assistance: 5: Supervision Ambulation Distance (Feet): 100 Feet Assistive device: Rolling walker Ambulation/Gait Assistance Details: min cues for position from RW Gait Pattern: Step-to pattern Stairs: Yes Stairs Assistance: 4: Min assist Stairs Assistance Details (indicate cue type and reason): cues for sequence and foot/crutch/RW placement Stair Management Technique: Step to pattern;With walker;With crutches;One rail Right;Backwards;Forwards Number of Stairs: 4  (4 fwd with crutch and rail, 3 bkwd with RW)        PT Diagnosis:    PT Problem List:   PT Treatment Interventions:     PT Goals Acute Rehab PT Goals PT Goal Formulation: With  patient Time For Goal Achievement: 07/10/11 Potential to Achieve Goals: Good Pt will go Supine/Side to Sit: with supervision PT Goal: Supine/Side to Sit - Progress: Progressing toward goal Pt will go Sit to Supine/Side: with supervision PT Goal: Sit to Supine/Side - Progress: Progressing toward goal Pt will go Sit to Stand: with supervision PT Goal: Sit to Stand - Progress: Progressing toward goal Pt will go Stand to Sit: with supervision PT Goal: Stand to Sit - Progress: Progressing toward goal Pt will Ambulate: >150 feet;with supervision;with rolling walker PT Goal: Ambulate - Progress: Progressing toward goal Pt will Go Up / Down Stairs: Flight;with min assist;with least restrictive assistive device PT Goal: Up/Down Stairs - Progress: Progressing toward goal  Visit Information  Last PT Received On: 07/07/11 Assistance Needed: +1    Subjective Data  Subjective: I am ready to go home Patient Stated Goal: Resume previous lifestyle with decreaed pain   Cognition  Overall Cognitive Status: Appears within functional limits for tasks assessed/performed Arousal/Alertness: Awake/alert Orientation Level: Appears intact for tasks assessed Behavior During Session: [facility] for tasks performed    Balance     End of Session PT - End of Session Equipment Utilized During Treatment: Right knee immobilizer Activity Tolerance: Patient tolerated treatment well Patient left: in chair;with call bell/phone within reach;with family/visitor present Nurse Communication: Mobility status    Kanden Carey 07/07/2011, 12:58 PM

## 2011-07-07 NOTE — Progress Notes (Signed)
Physical Therapy Treatment Patient Details Name: Janet Wilson MRN: [id number] DOB: 1934-03-07 Today's Date: 07/07/2011 Time: 4098-1191 PT Time Calculation (min): 36 min  PT Assessment / Plan / Recommendation Comments on Treatment Session  Pt requested rest break before attempting stairs    Follow Up Recommendations  Home health PT    Barriers to Discharge        Equipment Recommendations  3 in 1 bedside comode;Rolling walker with 5" wheels    Recommendations for Other Services OT consult  Frequency 7X/week   Plan Discharge plan remains appropriate    Precautions / Restrictions Precautions Precautions: Fall;Knee Required Braces or Orthoses: Knee Immobilizer - Right Knee Immobilizer - Right: Discontinue once straight leg raise with < 10 degree lag Restrictions Weight Bearing Restrictions: Yes Other Position/Activity Restrictions: WBAT   Pertinent Vitals/Pain     Mobility       Exercises Total Joint Exercises Ankle Circles/Pumps: 20 reps;AROM;Both Quad Sets: Both;20 reps;Supine;AROM Short Arc Quad: AAROM;AROM;Supine;Both;20 reps Heel Slides: AAROM;20 reps;Supine;Right Straight Leg Raises: AROM;AAROM;20 reps;Right;Supine Goniometric ROM: -10 - 100   PT Diagnosis:    PT Problem List:   PT Treatment Interventions:     PT Goals Acute Rehab PT Goals PT Goal Formulation: With patient Time For Goal Achievement: 07/10/11 Potential to Achieve Goals: Good  Visit Information  Last PT Received On: 07/07/11 Assistance Needed: +1    Subjective Data  Subjective: I am ready to go home Patient Stated Goal: Resume previous lifestyle with decreaed pain   Cognition  Overall Cognitive Status: Appears within functional limits for tasks assessed/performed Arousal/Alertness: Awake/alert Orientation Level: Appears intact for tasks assessed Behavior During Session: [facility] for tasks performed    Balance     End of Session PT - End of Session Activity Tolerance: Patient  tolerated treatment well    Janet Wilson 07/07/2011, 12:47 PM

## 2011-07-09 NOTE — Progress Notes (Signed)
Discharge summary sent to payer through [facility]  

## 2011-08-10 ENCOUNTER — Observation Stay (HOSPITAL_COMMUNITY): Payer: Medicare Other

## 2011-08-10 ENCOUNTER — Observation Stay (HOSPITAL_COMMUNITY)
Admission: AD | Admit: 2011-08-10 | Discharge: 2011-08-12 | Disposition: A | Discharge status: Home / Self Care | Payer: Medicare Other | Source: Ambulatory Visit | Attending: Internal Medicine | Admitting: Internal Medicine

## 2011-08-10 ENCOUNTER — Encounter (HOSPITAL_COMMUNITY): Payer: Self-pay

## 2011-08-10 DIAGNOSIS — I447 Left bundle-branch block, unspecified: Secondary | ICD-10-CM | POA: Insufficient documentation

## 2011-08-10 DIAGNOSIS — R000 Tachycardia, unspecified: Secondary | ICD-10-CM | POA: Insufficient documentation

## 2011-08-10 DIAGNOSIS — R0989 Other specified symptoms and signs involving the circulatory and respiratory systems: Principal | ICD-10-CM | POA: Insufficient documentation

## 2011-08-10 DIAGNOSIS — G43909 Migraine, unspecified, not intractable, without status migrainosus: Secondary | ICD-10-CM | POA: Insufficient documentation

## 2011-08-10 DIAGNOSIS — Z96659 Presence of unspecified artificial knee joint: Secondary | ICD-10-CM | POA: Insufficient documentation

## 2011-08-10 DIAGNOSIS — K219 Gastro-esophageal reflux disease without esophagitis: Secondary | ICD-10-CM | POA: Insufficient documentation

## 2011-08-10 DIAGNOSIS — R0609 Other forms of dyspnea: Principal | ICD-10-CM | POA: Insufficient documentation

## 2011-08-10 LAB — IRON AND TIBC
Iron: 46 ug/dL (ref 42–135)
Saturation Ratios: 21 % (ref 20–55)
TIBC: 215 ug/dL — ABNORMAL LOW (ref 250–470)
UIBC: 169 ug/dL (ref 125–400)

## 2011-08-10 LAB — CBC
HCT: 33.9 % — ABNORMAL LOW (ref 36.0–46.0)
Hemoglobin: 10.9 g/dL — ABNORMAL LOW (ref 12.0–15.0)
MCH: 30.1 pg (ref 26.0–34.0)
MCHC: 32.2 g/dL (ref 30.0–36.0)
MCV: 93.6 fL (ref 78.0–100.0)
Platelets: 215 10*3/uL (ref 150–400)
RBC: 3.62 MIL/uL — ABNORMAL LOW (ref 3.87–5.11)
RDW: 13.6 % (ref 11.5–15.5)
WBC: 5.4 10*3/uL (ref 4.0–10.5)

## 2011-08-10 LAB — HEPATIC FUNCTION PANEL
ALT: 9 U/L (ref 0–35)
AST: 14 U/L (ref 0–37)
Albumin: 3.6 g/dL (ref 3.5–5.2)
Alkaline Phosphatase: 81 U/L (ref 39–117)
Bilirubin, Direct: <0.1 mg/dL (ref 0.0–0.3)
Total Bilirubin: 0.3 mg/dL (ref 0.3–1.2)
Total Protein: 7 g/dL (ref 6.0–8.3)

## 2011-08-10 LAB — BASIC METABOLIC PANEL
BUN: 20 mg/dL (ref 6–23)
CO2: 30 mEq/L (ref 19–32)
Calcium: 9.7 mg/dL (ref 8.4–10.5)
Chloride: 101 mEq/L (ref 96–112)
Creatinine, Ser: 1 mg/dL (ref 0.50–1.10)
GFR calc Af Amer: 62 mL/min — ABNORMAL LOW (ref >90)
GFR calc non Af Amer: 54 mL/min — ABNORMAL LOW (ref >90)
Glucose, Bld: 79 mg/dL (ref 70–99)
Potassium: 4.4 mEq/L (ref 3.5–5.1)
Sodium: 140 mEq/L (ref 135–145)

## 2011-08-10 LAB — URINALYSIS, ROUTINE W REFLEX MICROSCOPIC
Bilirubin Urine: NEGATIVE
Glucose, UA: NEGATIVE mg/dL
Hgb urine dipstick: NEGATIVE
Ketones, ur: NEGATIVE mg/dL
Nitrite: NEGATIVE
Protein, ur: NEGATIVE mg/dL
Specific Gravity, Urine: 1.019 (ref 1.005–1.030)
Urobilinogen, UA: 0.2 mg/dL (ref 0.0–1.0)
pH: 6 (ref 5.0–8.0)

## 2011-08-10 LAB — CARDIAC PANEL(CRET KIN+CKTOT+MB+TROPI)
CK, MB: 1.5 ng/mL (ref 0.3–4.0)
CK, MB: 1.5 ng/mL (ref 0.3–4.0)
Relative Index: INVALID (ref 0.0–2.5)
Relative Index: INVALID (ref 0.0–2.5)
Total CK: 43 U/L (ref 7–177)
Total CK: 40 U/L (ref 7–177)
Troponin I: <0.3 ng/mL (ref <0.30)
Troponin I: <0.3 ng/mL (ref <0.30)

## 2011-08-10 LAB — DIFFERENTIAL
Basophils Absolute: 0 10*3/uL (ref 0.0–0.1)
Basophils Relative: 0 % (ref 0–1)
Eosinophils Absolute: 0.1 10*3/uL (ref 0.0–0.7)
Eosinophils Relative: 2 % (ref 0–5)
Lymphocytes Relative: 28 % (ref 12–46)
Lymphs Abs: 1.5 10*3/uL (ref 0.7–4.0)
Monocytes Absolute: 0.4 10*3/uL (ref 0.1–1.0)
Monocytes Relative: 8 % (ref 3–12)
Neutro Abs: 3.4 10*3/uL (ref 1.7–7.7)
Neutrophils Relative %: 62 % (ref 43–77)

## 2011-08-10 LAB — MAGNESIUM: Magnesium: 2.3 mg/dL (ref 1.5–2.5)

## 2011-08-10 LAB — URINE MICROSCOPIC-ADD ON

## 2011-08-10 LAB — FERRITIN: Ferritin: 144 ng/mL (ref 10–291)

## 2011-08-10 LAB — D-DIMER, QUANTITATIVE (NOT AT ARMC): D-Dimer, Quant: 2.83 ug/mL-FEU — ABNORMAL HIGH (ref 0.00–0.48)

## 2011-08-10 LAB — PRO B NATRIURETIC PEPTIDE: Pro B Natriuretic peptide (BNP): 143 pg/mL (ref 0–450)

## 2011-08-10 LAB — TSH: TSH: 0.527 u[IU]/mL (ref 0.350–4.500)

## 2011-08-10 MED ORDER — DOCUSATE SODIUM 100 MG PO CAPS
100.0000 mg | ORAL_CAPSULE | Freq: Two times a day (BID) | ORAL | Status: DC
  Administered 2011-08-10 – 2011-08-12 (×3): 100 mg via ORAL
  Filled 2011-08-10 (×5): qty 1

## 2011-08-10 MED ORDER — ACETAMINOPHEN 650 MG RE SUPP: 650.0000 mg | Freq: Four times a day (QID) | RECTAL | Status: DC | PRN

## 2011-08-10 MED ORDER — ONDANSETRON HCL 4 MG PO TABS
4.0000 mg | ORAL_TABLET | Freq: Four times a day (QID) | ORAL | Status: DC | PRN
  Administered 2011-08-12: 4 mg via ORAL
  Filled 2011-08-10: qty 1

## 2011-08-10 MED ORDER — IOHEXOL 350 MG/ML SOLN
80.0000 mL | Freq: Once | INTRAVENOUS | Status: AC | PRN
  Administered 2011-08-10: 80 mL via INTRAVENOUS

## 2011-08-10 MED ORDER — ZOLPIDEM TARTRATE 5 MG PO TABS: 5.0000 mg | ORAL_TABLET | Freq: Every evening | ORAL | Status: DC | PRN

## 2011-08-10 MED ORDER — SODIUM CHLORIDE 0.9 % IJ SOLN
3.0000 mL | INTRAMUSCULAR | Status: DC | PRN
  Administered 2011-08-10: 3 mL via INTRAVENOUS

## 2011-08-10 MED ORDER — ONDANSETRON HCL 4 MG/2ML IJ SOLN: 4.0000 mg | Freq: Four times a day (QID) | INTRAMUSCULAR | Status: DC | PRN

## 2011-08-10 MED ORDER — ACETAMINOPHEN 325 MG PO TABS: 650.0000 mg | ORAL_TABLET | Freq: Four times a day (QID) | ORAL | Status: DC | PRN

## 2011-08-10 MED ORDER — TRAMADOL HCL 50 MG PO TABS
50.0000 mg | ORAL_TABLET | Freq: Four times a day (QID) | ORAL | Status: DC | PRN
  Administered 2011-08-10 – 2011-08-12 (×6): 50 mg via ORAL
  Filled 2011-08-10 (×6): qty 1

## 2011-08-10 MED ORDER — ASPIRIN EC 81 MG PO TBEC
81.0000 mg | DELAYED_RELEASE_TABLET | Freq: Every day | ORAL | Status: DC
  Administered 2011-08-10 – 2011-08-12 (×3): 81 mg via ORAL
  Filled 2011-08-10 (×3): qty 1

## 2011-08-10 NOTE — H&P (Addendum)
Hospital Admission Note Date: 08/10/2011  Patient name: Janet Wilson Medical record number: [id number] Date of birth: 1935-01-11 Age: 77 y.o. Gender: female PCP: Pearla Dubonnet, MD  Attending physician: Pearla Dubonnet, MD  Chief Complaint: tachycardia, unexplained  History of Present Illness: Janet Wilson is an 77 y.o. female  With recent surgery for DJD of the right knee. She had a right total knee replacement in late May, 2013. She has a history of migraine headaches and has used Verapamil SR for migraine prophylaxis. She also has a history of moderate aortic sclerosis and mild aortic insufficiency on 2-D echocardiogram in July, 2008. She has a history of hormone replacement therapy but is no longer on hormone replacement therapy. Postoperatively for her right knee, she had developed a postoperative anemia and was seen in our office in the last week or so with a hemoglobin approximately 4 g lower than usual, having dropped from 13.6 preoperatively for her right total knee replacement down to 9.5. She was started on iron but could not tolerate the iron. She presents to my office today complaining of severe fatigue and she has a sinus tachycardia at 125 to 130. She also has a left bundle branch block. Denies any melena. No hemoptysis. No fevers or chills. She does have a very strong family history for coronary artery disease. She is being admitted for further workup. She is on verapamil therapy chronically which she takes in the evenings 120 mg, controlled-release.she has not taken her medication yet today.  Past Medical History  Diagnosis Date  . Overactive bladder   . GERD (gastroesophageal reflux disease)   . Goiter     CAUSING COUGH, HOARSINESS AND DRY THROAT  . Headache     MIGRAINES - TAKES VERAPAMIL FOR PREVENTION OF MIGRAINES  . 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 Loreta Ave History of estrogen replacement therapy but not currently Bilateral carpal tunnel syndrome Bilateral rotator cuff tears History of left lower extremity sciatica Osteopenia Fibrocystic breast disease Urgent stress urinary incontinence - Dr. Lorin Picket McDiarmid Glaucoma - Dr. Hilbert Corrigan Migraine headaches - [facility] Cervical radiculopathy, May, 2012, Dr. Maeola Harman GERD Multinodular goiter with benign biopsies in 2013   Meds: Prior to Admission medications   Medication Sig Start Date End Date Taking? Authorizing Provider  diazepam (VALIUM) 5 MG tablet Take 2.5 mg by mouth at bedtime.    Historical Provider, MD  iron polysaccharides (NIFEREX) 150 MG capsule Take 1 capsule (150 mg total) by mouth daily. 07/06/11 07/05/12  Alexzandrew Perkins, PA  magnesium oxide (MAG-OX) 400 MG tablet Take 400 mg by mouth daily with breakfast.    Historical Provider, MD  nitrofurantoin, macrocrystal-monohydrate, (MACROBID) 100 MG capsule Take 100 mg by mouth 2 (two) times daily as needed. For UTI    Historical Provider, MD  omeprazole (PRILOSEC) 20 MG capsule Take 20 mg by mouth daily with breakfast.    Historical Provider, MD  rivaroxaban (XARELTO) 10 MG TABS tablet Take 1 tablet (10 mg total) by mouth daily with breakfast. Take for two and a half more weeks, then discontinue Xarelto. Once the patient has completed the Xarelto, they may resume the 81 mg Aspirin. 07/06/11   Alexzandrew Perkins, PA  verapamil (COVERA HS) 180 MG (CO) 24  hr tablet Take 120 mg by mouth 1 day or 1 dose.     Historical Provider, MD  zolmitriptan (ZOMIG) 5 MG tablet Take 5 mg by mouth daily as needed. For migraine    Historical Provider, MD   Allergies: No Known Allergies History   Social History  . Marital Status: Married    Spouse Name: N/A    Number of Children: N/A  . Years  of Education: N/A   Occupational History  . Not on file.   Social History Main Topics  . Smoking status: Never Smoker   . Smokeless tobacco: Not on file  . Alcohol Use: Yes     SELDOM  . Drug Use: No  . Sexually Active:    Other Topics Concern  . Not on file   Social history:  Patient is married to [employer]. One son and one daughter. Dr. Shari Heritage has ADD. Homemaker. Exercises regularly but not lately secondary to right total knee replacement. Has been undergoing physical therapy as tolerated with ongoing anemia      Family history:  Father died at age 85 of Alzheimer's disease and mother died at age 37 of a myocardial infarction but was a smoker. Very strong family history for coronary artery disease Past Surgical History  Procedure Date  . Cervical fusion - C4-C7, Dr. Maeola Harman, Jul 02, 2010 2012  . Left shoulder surgery - rotator cuff   . Incontinence surgery   . Right knee arthroscopy  11/2009   . Eye surgery     CATARACT EXTRACTION- BILATERAL  . Total knee arthroplasty 07/04/2011    Procedure: TOTAL KNEE ARTHROPLASTY;  Surgeon: Loanne Drilling, MD;  Location: [facility];  Service: Orthopedics;  Laterality: Right;    Review of Systems:  Denies headache or visual changes. No dysphagia. Feels generally quite fatigued. No fevers or chills. No change in bowel habits or urinary habits. Denies chest pain. Has noticed some shortness of breath with exertion. Poor appetite. Weight loss over the past couple of months    Physical Exam: Blood pressure 119/84, pulse 126, temperature 98 F (36.7 C), temperature source Oral, height 5\' 4"  (1.626 m), SpO2 98.00%.  General Appearance: Alert, cooperative, no distress, appears stated age Head: Normocephalic, without obvious abnormality, atraumatic Neck: Supple, symmetrical, trachea midline, no adenopathy; thyroid: mild enlargement, nontender; no carotid bruit or JVD Lungs: Clear to auscultation bilaterally, respirations unlabored Heart:   Increased rated rate, S1 and S2 normal, no murmur, rub or gallop Abdomen: Soft, non-tender, bowel sounds active all four quadrants, no masses, no organomegaly Extremities: Extremities normal, atraumatic, no cyanosis or edema Pulses: 2+ and symmetric all extremities Skin: Skin color, texture, turgor normal, no rashes or lesions Neuro: CNII-XII intact. Normal strength, sensation and reflexes throughout  Lab results:  CBC, d-dimer, cardiac enzymes, basic metabolic profile, hepatic enzymes, thyroid function, urinalysis, EKG, and 2-D echo pending. EKG in office today revealed sinus tachycardia with left bundle branch block   Imaging results:  Chest x-ray pending  Assessment & Plan: Patient Active Problem List   Diagnosis Date Noted  . Tachycardia - admit patient and monitor. Check chest x-ray repeat EKG and cardiac enzymes. Check pulse oximetry.  Check d-dimer is an elevated, consider CT angio of the chest to rule out PE. May simply be secondary to anemia. Has a history of a mild sinus tachycardia chronically. Check 2-D echocardiogram to rule out cardiomyopathy etc.. Cardiology consultation. Has seen Dr. Catalina Gravel in the past couple of weeks who had  planned an ischemic workup when her hemoglobin was normal 08/10/2011  . OA (osteoarthritis) of knee - status post right total knee replacement. Check on status of anticoagulation therapy History of multinodular goiter - check thyroid function Anemia - check CBC and iron studies  07/04/2011     Jeffifer Rabold NEVILL 08/10/2011, 3:08 PM

## 2011-08-10 NOTE — Consult Note (Signed)
Admit date: 08/10/2011 Referring Physician   Pearla Dubonnet, MD Primary Cardiologist  Dr. Eldridge Dace Reason for Consultation : Tachycardia with left bundle branch block  HPI: 77 year old female with right knee replacement 4 weeks ago, anemia with hemoglobin as low as 9.1 postoperative, who has been having issues with increasing fatigue recently. She was to undergo nuclear stress test once her hemoglobin improved. She ended up seeing Dr. Kevan Ny today in the office because she was continuing to lose weight and feeling poorly. Upon auscultation tachycardia was noted. Her oxygen saturation was 97%. She had a newly discovered left bundle branch block with what appears to be P waves preceding each QRS complex. This appears to be a rate-related bundle branch block. Her blood pressure is stable at 118/76. In the past, 2008, she had mild aortic insufficiency with moderate aortic valve sclerosis. Her mother died age 19 from myocardial infarction. She is a nonsmoker, no alcohol she has migraine headaches and takes verapamil for this. During her rehabilitation efforts, there may have been brief episodes of atrial fibrillation. She's not complaining of increasing edema in her right extremity. There is some mild popliteal tenderness she states.    PMH:   Past Medical History  Diagnosis Date  . Overactive bladder   . GERD (gastroesophageal reflux disease)   . Goiter     CAUSING COUGH, HOARSINESS AND DRY THROAT  . Headache     MIGRAINES - TAKES VERAPAMIL FOR PREVENTION OF MIGRAINES  . Arthritis     OA BOTH KNEES AND HANDS    PSH:   Past Surgical History  Procedure Date  . Cervical fusion 2012  . Left shoulder surgery   . Incontinence surgery   . Right knee arthroscopy  11/2009   . Eye surgery     CATARACT EXTRACTION- BILATERAL  . Total knee arthroplasty 07/04/2011    Procedure: TOTAL KNEE ARTHROPLASTY;  Surgeon: Loanne Drilling, MD;  Location: [facility];  Service: Orthopedics;  Laterality: Right;    Allergies:  Review of patient's allergies indicates no known allergies. Prior to Admit Meds:   Prescriptions prior to admission  Medication Sig Dispense Refill  . Ascorbic Acid (VITAMIN C PO) Take 1 tablet by mouth daily.      Marland Kitchen aspirin 81 MG chewable tablet Chew 81 mg by mouth daily.      . brinzolamide (AZOPT) 1 % ophthalmic suspension Place 1 drop into the left eye 2 (two) times daily.      . diazepam (VALIUM) 5 MG tablet Take 5 mg by mouth at bedtime.       . iron polysaccharides (NIFEREX) 150 MG capsule Take 1 capsule (150 mg total) by mouth daily.  21 capsule  0  . magnesium oxide (MAG-OX) 400 MG tablet Take 400 mg by mouth daily with breakfast.      . nitrofurantoin, macrocrystal-monohydrate, (MACROBID) 100 MG capsule Take 100 mg by mouth 2 (two) times daily as needed. For UTI      . omega-3 acid ethyl esters (LOVAZA) 1 G capsule Take 1 g by mouth daily.      Marland Kitchen omeprazole (PRILOSEC) 20 MG capsule Take 20 mg by mouth daily with breakfast.      . traMADol (ULTRAM) 50 MG tablet Take 50 mg by mouth every 6 (six) hours as needed. For pain      . verapamil (VERELAN PM) 120 MG 24 hr capsule Take 120 mg by mouth at bedtime.      Marland Kitchen VITAMIN D, CHOLECALCIFEROL, PO Take  1 tablet by mouth daily.      Marland Kitchen zolmitriptan (ZOMIG) 5 MG tablet Take 5 mg by mouth daily as needed. For migraine       Fam HX:   No family history on file. Social HX:    History   Social History  . Marital Status: Married    Spouse Name: N/A    Number of Children: N/A  . Years of Education: N/A   Occupational History  . Not on file.   Social History Main Topics  . Smoking status: Never Smoker   . Smokeless tobacco: Not on file  . Alcohol Use: Yes     SELDOM  . Drug Use: No  . Sexually Active:    Other Topics Concern  . Not on file   Social History Narrative  . No narrative on file     ROS:  All 11 ROS were addressed and are negative except what is stated in the HPI  Physical Exam: Blood pressure  119/84, pulse 126, temperature 98 F (36.7 C), temperature source Oral, height 5\' 4"  (1.626 m), SpO2 98.00%.    General: Well developed, well nourished, in no acute distress Head: Eyes PERRLA, No xanthomas.   Normal cephalic and atramatic  Lungs:   Clear bilaterally to auscultation and percussion. Normal respiratory effort. No wheezes, no rales. Heart:   Tachycardic, regular S1 S2 Pulses are 2+ & equal. During early inspiration there does appear to be in intermittent rub-like sound that dissipates with deep inspiration. No significant murmur identified. This heart sound is heard best at the left lower sternal border.  No carotid bruit. No JVD.  No abdominal bruits. No femoral bruits. Abdomen: Bowel sounds are positive, abdomen soft and non-tender without masses. No hepatosplenomegaly. Msk:  Back normal. Normal strength and tone for age. Extremities:  No clubbing, cyanosis or edema.  DP +1 right knee scar noted Neuro: Alert and oriented X 3, non-focal, MAE x 4 GU: Deferred Rectal: Deferred Psych:  Good affect, responds appropriately    Labs:   Lab Results  Component Value Date   WBC 5.4 08/10/2011   HGB 10.9* 08/10/2011   HCT 33.9* 08/10/2011   MCV 93.6 08/10/2011   PLT 215 08/10/2011    No results found for this basename: PTT   Lab Results  Component Value Date   INR 1.03 06/27/2011    EKG:  There appears to be a P-wave preceding each QRS complex with some rate variability. For instance she is currently 112 beats per minute but she has been as high as 125 beats per minute. This appears to be sinus tachycardia. Interestingly, axis did shift in her QRS widened and this is likely a rate related bundle branch block.  Personally viewed.   ASSESSMENT/PLAN:   77 year old with recent right knee replacement approximately 4 weeks ago with anemia post surgery, admitted with unexplained tachycardia with recent cardiology evaluation by Dr. Eldridge Dace (awaiting nuclear stress test after hemoglobin  improves)  Tachycardia  - Appears to be sinus tachycardia with subtle rate variation. The left bundle branch block is likely a rate related bundle branch block. This is a change from a prior EKG. This is not suggestive of ventricular tachycardia. I did have EKGs reviewed by Dr. Johney Frame of electrophysiology who agreed.  - The question remains why does she have tachycardia. Her anemia may be contributed somewhat to this but this seems to be improving. Her d-dimer was positive at 2.83 which could certainly be from postoperative knee changes  however one must consider pulmonary embolism in the differential diagnosis with unexplained tachycardia following right knee replacement. Ypu may wish to proceed with CT angiogram. I also agree with checking a 2-D echocardiogram. TSH would be reasonable as well and in fact is currently pending. She does not have excessive dyspnea and she does not appear to be in distress.  - She explained to me that Dr. Kevan Ny was going to place her on verapamil 120 mg twice a day as opposed to 120 mg in the evening. She's been taking verapamil not for hypertension or SVT but for migraine prophylaxis. This sounds like a very reasonable plan.  - Cardiac biomarkers, first set are unremarkable. Reassuring.  We will continue to follow along. Dr. Mayford Knife will be rounding over the weekend.   Donato Schultz, MD  08/10/2011  4:27 PM

## 2011-08-11 LAB — CBC
HCT: 31.4 % — ABNORMAL LOW (ref 36.0–46.0)
Hemoglobin: 10.2 g/dL — ABNORMAL LOW (ref 12.0–15.0)
MCH: 30.3 pg (ref 26.0–34.0)
MCHC: 32.5 g/dL (ref 30.0–36.0)
MCV: 93.2 fL (ref 78.0–100.0)
Platelets: 196 10*3/uL (ref 150–400)
RBC: 3.37 MIL/uL — ABNORMAL LOW (ref 3.87–5.11)
RDW: 13.6 % (ref 11.5–15.5)
WBC: 4.8 10*3/uL (ref 4.0–10.5)

## 2011-08-11 LAB — CARDIAC PANEL(CRET KIN+CKTOT+MB+TROPI)
CK, MB: 1.4 ng/mL (ref 0.3–4.0)
Relative Index: INVALID (ref 0.0–2.5)
Total CK: 42 U/L (ref 7–177)
Troponin I: <0.3 ng/mL (ref <0.30)

## 2011-08-11 MED ORDER — VERAPAMIL HCL ER 120 MG PO TBCR
120.0000 mg | EXTENDED_RELEASE_TABLET | Freq: Every day | ORAL | Status: DC
  Administered 2011-08-11: 120 mg via ORAL
  Filled 2011-08-11 (×2): qty 1

## 2011-08-11 NOTE — Progress Notes (Signed)
Subjective: The patient has not been aware of any recurrent dyspnea and has had no chest pain  Objective: Vital signs in last 24 hours: Temp:  [98 F (36.7 C)-98.3 F (36.8 C)] 98 F (36.7 C) (06/29 0426) Pulse Rate:  [96-126] 96  (06/29 0426) Resp:  [18] 18  (06/29 0426) BP: (112-129)/(77-84) 112/77 mmHg (06/29 0426) SpO2:  [96 %-98 %] 97 % (06/29 0426) Weight:  [54.8 kg (120 lb 13 oz)] 54.8 kg (120 lb 13 oz) (06/28 2018) Weight change:  Last BM Date: 08/10/11  Intake/Output from previous day: 06/28 0701 - 06/29 0700 In: 3 [I.V.:3] Out: -  Intake/Output this shift: Total I/O In: 240 [P.O.:240] Out: -   Resp: clear to auscultation bilaterally Cardio: regular rate and rhythm, S1, S2 normal, no murmur, click, rub or gallop  Lab Results:  Basename 08/11/11 0441 08/10/11 1533  WBC 4.8 5.4  HGB 10.2* 10.9*  HCT 31.4* 33.9*  PLT 196 215   BMET  Basename 08/10/11 1533  NA 140  K 4.4  CL 101  CO2 30  GLUCOSE 79  BUN 20  CREATININE 1.00  CALCIUM 9.7    Studies/Results: X-ray Chest Pa And Lateral   08/10/2011  *RADIOLOGY REPORT*  Clinical Data: Unexplained tachycardia  CHEST - 2 VIEW  Comparison: 11/01/2010  Findings: Normal heart size and pulmonary vascularity. Calcified tortuous aorta. Lungs appear emphysematous but clear. No pleural effusion or pneumothorax. Prior cervical spine fusion. Bones appear demineralized.  IMPRESSION: Emphysematous changes. No acute abnormalities.  Original Report Authenticated By: Lollie Marrow, M.D.   Ct Angio Chest W/cm &/or Wo Cm  08/10/2011  *RADIOLOGY REPORT*  Clinical Data: Fatigue.  Sinus tachycardia.  Recent history of total knee replacement.  Evaluate for pulmonary embolism.  CT ANGIOGRAPHY CHEST  Technique:  Multidetector CT imaging of the chest using the standard protocol during bolus administration of intravenous contrast. Multiplanar reconstructed images including MIPs were obtained and reviewed to evaluate the vascular anatomy.   Contrast: 80mL OMNIPAQUE IOHEXOL 350 MG/ML SOLN  Comparison: No priors.  Findings:  Mediastinum: No filling defects within the pulmonary arterial tree to suggest underlying pulmonary embolism. Heart size is normal. There is no significant pericardial fluid, thickening or pericardial calcification. There is atherosclerosis of the thoracic aorta, the great vessels of the mediastinum and the coronary arteries, including calcified atherosclerotic plaque in the left main, left anterior descending and right coronary arteries. No pathologically enlarged mediastinal or hilar lymph nodes. Esophagus is unremarkable in appearance.    Calcifications of the aortic valve and mitral annulus.  Lungs/Pleura: There are a few scattered calcified pulmonary nodules compatible with calcified granulomas.  No suspicious appearing pulmonary nodules or masses are otherwise noted.  No consolidative airspace disease.  No pleural effusions.  Upper Abdomen: There are a few scattered low attenuation hepatic lesions, most of which are too small to definitively characterize. The largest of these lesions is approximately 12 mm in diameter in segment 7, compatible with a cyst.  Musculoskeletal: There are no aggressive appearing lytic or blastic lesions noted in the visualized portions of the skeleton. Orthopedic fixation hardware is incompletely visualized in the lower cervical spine.  IMPRESSION: 1.  No evidence of pulmonary embolism. 2.  No acute findings in the thorax to account for the patient's symptoms. 3. Atherosclerosis, including left main and two-vessel coronary artery disease.  Assessment for potential risk factor modification, dietary therapy or pharmacologic therapy may be warranted, if clinically indicated. 4. There are calcifications of the aortic valve  and mitral annulus. Echocardiographic correlation for evaluation of potential valvular dysfunction may be warranted if clinically indicated.  5.  Multifocal low attenuation lesions in  the liver are favored to represent small cysts and/or biliary hamartomas.  Original Report Authenticated By: Florencia Reasons, M.D.    Medications:  Scheduled:   . aspirin EC  81 mg Oral Daily  . docusate sodium  100 mg Oral BID    Assessment/Plan: Heart rate down in the 90 range she is undergoing cardiac echo and pharmacologic stress is planned tomorrow She has a hx of migraines I will restart verapamil 120  LOS: 1 day   [facility] 08/11/2011, 10:45 AM

## 2011-08-11 NOTE — Progress Notes (Signed)
Brief Nutrition Note  Patient identified on the Nutrition Risk Report for unintentional weight loss.  According to weight documentation in EPIC, weight was down to 114 lb for a few weeks, but is back up to normal weight of 120-121 lb.  Wt Readings from Last 12 Encounters:  08/10/11 120 lb 13 oz (54.8 kg)  07/04/11 114 lb (51.71 kg)  07/04/11 114 lb (51.71 kg)  06/27/11 121 lb (54.885 kg)    Body mass index is 20.74 kg/(m^2). Normal Weight  Current diet order is Regular, patient is consuming approximately 75% of meals at this time.  To be NPO for procedure in the morning.  Labs and medications reviewed.   No nutrition interventions warranted at this time. If additional nutrition issues arise, please re-consult RD.    Joaquin Courts, RD, CNSC, LDN Pager# [phone] After Hours Pager# [phone]

## 2011-08-11 NOTE — Progress Notes (Signed)
SUBJECTIVE:  Doing well.  Heart rate better controlled  OBJECTIVE:   Vitals:   Filed Vitals:   08/10/11 1500 08/10/11 2018 08/11/11 0426  BP: 119/84 129/82 112/77  Pulse: 126 96 96  Temp: 98 F (36.7 C) 98.3 F (36.8 C) 98 F (36.7 C)  TempSrc: Oral Oral Oral  Resp: 18 18 18   Height: 5\' 4"  (1.626 m) 5\' 4"  (1.626 m)   Weight:  54.8 kg (120 lb 13 oz)   SpO2: 98% 96% 97%   I&O's:   Intake/Output Summary (Last 24 hours) at 08/11/11 0749 Last data filed at 08/10/11 2206  Gross per 24 hour  Intake      3 ml  Output      0 ml  Net      3 ml   TELEMETRY: Reviewed telemetry pt in NSR:     PHYSICAL EXAM General: Well developed, well nourished, in no acute distress  Lungs:   Clear bilaterally to auscultation and percussion. Heart:   HRRR S1 S2 Pulses are 2+ & equal. Abdomen: Bowel sounds are positive, abdomen soft and non-tender without masses  Extremities:   No clubbing, cyanosis or edema.  DP +1 Neuro: Alert and oriented X 3. Psych:  Good affect, responds appropriately   LABS: Basic Metabolic Panel:  Basename 08/10/11 1533  NA 140  K 4.4  CL 101  CO2 30  GLUCOSE 79  BUN 20  CREATININE 1.00  CALCIUM 9.7  MG 2.3  PHOS --   Liver Function Tests:  [facility] 08/10/11 1533  AST 14  ALT 9  ALKPHOS 81  BILITOT 0.3  PROT 7.0  ALBUMIN 3.6   No results found for this basename: LIPASE:2,AMYLASE:2 in the last 72 hours CBC:  Basename 08/11/11 0441 08/10/11 1533  WBC 4.8 5.4  NEUTROABS -- 3.4  HGB 10.2* 10.9*  HCT 31.4* 33.9*  MCV 93.2 93.6  PLT 196 215   Cardiac Enzymes:  Basename 08/10/11 2242 08/10/11 1539  CKTOTAL 40 43  CKMB 1.5 1.5  CKMBINDEX -- --  TROPONINI <0.30 <0.30   BNP: No components found with this basename: POCBNP:3 D-Dimer:  Basename 08/10/11 1533  DDIMER 2.83*   Hemoglobin A1C: No results found for this basename: HGBA1C in the last 72 hours Fasting Lipid Panel: No results found for this basename:  CHOL,HDL,LDLCALC,TRIG,CHOLHDL,LDLDIRECT in the last 72 hours Thyroid Function Tests:  Basename 08/10/11 1533  TSH 0.527  T4TOTAL --  T3FREE --  THYROIDAB --   Anemia Panel:  Basename 08/10/11 1533  VITAMINB12 --  FOLATE --  FERRITIN 144  TIBC 215*  IRON 46  RETICCTPCT --   Coag Panel:   Lab Results  Component Value Date   INR 1.03 06/27/2011    RADIOLOGY: X-ray Chest Pa And Lateral   08/10/2011  *RADIOLOGY REPORT*  Clinical Data: Unexplained tachycardia  CHEST - 2 VIEW  Comparison: 11/01/2010  Findings: Normal heart size and pulmonary vascularity. Calcified tortuous aorta. Lungs appear emphysematous but clear. No pleural effusion or pneumothorax. Prior cervical spine fusion. Bones appear demineralized.  IMPRESSION: Emphysematous changes. No acute abnormalities.  Original Report Authenticated By: Lollie Marrow, M.D.   Ct Angio Chest W/cm &/or Wo Cm  08/10/2011  *RADIOLOGY REPORT*  Clinical Data: Fatigue.  Sinus tachycardia.  Recent history of total knee replacement.  Evaluate for pulmonary embolism.  CT ANGIOGRAPHY CHEST  Technique:  Multidetector CT imaging of the chest using the standard protocol during bolus administration of intravenous contrast. Multiplanar reconstructed images including MIPs were  obtained and reviewed to evaluate the vascular anatomy.  Contrast: 80mL OMNIPAQUE IOHEXOL 350 MG/ML SOLN  Comparison: No priors.  Findings:  Mediastinum: No filling defects within the pulmonary arterial tree to suggest underlying pulmonary embolism. Heart size is normal. There is no significant pericardial fluid, thickening or pericardial calcification. There is atherosclerosis of the thoracic aorta, the great vessels of the mediastinum and the coronary arteries, including calcified atherosclerotic plaque in the left main, left anterior descending and right coronary arteries. No pathologically enlarged mediastinal or hilar lymph nodes. Esophagus is unremarkable in appearance.     Calcifications of the aortic valve and mitral annulus.  Lungs/Pleura: There are a few scattered calcified pulmonary nodules compatible with calcified granulomas.  No suspicious appearing pulmonary nodules or masses are otherwise noted.  No consolidative airspace disease.  No pleural effusions.  Upper Abdomen: There are a few scattered low attenuation hepatic lesions, most of which are too small to definitively characterize. The largest of these lesions is approximately 12 mm in diameter in segment 7, compatible with a cyst.  Musculoskeletal: There are no aggressive appearing lytic or blastic lesions noted in the visualized portions of the skeleton. Orthopedic fixation hardware is incompletely visualized in the lower cervical spine.  IMPRESSION: 1.  No evidence of pulmonary embolism. 2.  No acute findings in the thorax to account for the patient's symptoms. 3. Atherosclerosis, including left main and two-vessel coronary artery disease.  Assessment for potential risk factor modification, dietary therapy or pharmacologic therapy may be warranted, if clinically indicated. 4. There are calcifications of the aortic valve and mitral annulus. Echocardiographic correlation for evaluation of potential valvular dysfunction may be warranted if clinically indicated.  5.  Multifocal low attenuation lesions in the liver are favored to represent small cysts and/or biliary hamartomas.  Original Report Authenticated By: Florencia Reasons, M.D.      ASSESSMENT:  1.  Tachycardia improved.  Hemoglobin stable. No evidence of PE on chest CT, 2.  LBBB - rate dependent.   3.  Exertional fatigue with coronary artery calcifications on chest CT and new LBBB  PLAN:   1.  OK to restart Verapamil for migraine headache prophylaxis 2.  NPO after midnight 3.  Lexiscan Cardiolyte to rule out ischemia in the am  Quintella Reichert, MD  08/11/2011  7:49 AM

## 2011-08-11 NOTE — Progress Notes (Signed)
Subjective: I reviewed laboratories and imaging studies from last night. I have not seen the patient today.  She will be rounded on by Dr. Merlene Laughter and Dr. Carolanne Grumbling. Have not restarted verapamil SR as yet. Verapamil SR has been used for migraine prophylaxis.  I am wondering if cardiology would like to perform an ischemic workup first or have other ideas about treatment of the tachycardia. Hemoglobin has improved and probably is not contributing that much to her tachycardia or fatigue. Thyroid function is normal, cardiac enzymes were normal and iron levels are okay. No evidence of pulmonary embolism on CT angiogram but coronary artery plaque was readily detected in this patient with a very strong family history of coronary artery disease.  Objective: Weight change:   Intake/Output Summary (Last 24 hours) at 08/11/11 0554 Last data filed at 08/10/11 2206  Gross per 24 hour  Intake      3 ml  Output      0 ml  Net      3 ml   Filed Vitals:   08/10/11 1500 08/10/11 2018 08/11/11 0426  BP: 119/84 129/82 112/77  Pulse: 126 96 96  Temp: 98 F (36.7 C) 98.3 F (36.8 C) 98 F (36.7 C)  TempSrc: Oral Oral Oral  Resp: 18 18 18   Height: 5\' 4"  (1.626 m) 5\' 4"  (1.626 m)   Weight:  54.8 kg (120 lb 13 oz)   SpO2: 98% 96% 97%    Lab Results:  Basename 08/10/11 1533  NA 140  K 4.4  CL 101  CO2 30  GLUCOSE 79  BUN 20  CREATININE 1.00  CALCIUM 9.7  MG 2.3  PHOS --    Basename 08/10/11 1533  AST 14  ALT 9  ALKPHOS 81  BILITOT 0.3  PROT 7.0  ALBUMIN 3.6   No results found for this basename: LIPASE:2,AMYLASE:2 in the last 72 hours  Basename 08/11/11 0441 08/10/11 1533  WBC 4.8 5.4  NEUTROABS -- 3.4  HGB 10.2* 10.9*  HCT 31.4* 33.9*  MCV 93.2 93.6  PLT 196 215    Basename 08/10/11 2242 08/10/11 1539  CKTOTAL 40 43  CKMB 1.5 1.5  CKMBINDEX -- --  TROPONINI <0.30 <0.30   No components found with this basename: POCBNP:3  Basename 08/10/11 1533  DDIMER 2.83*   No  results found for this basename: HGBA1C:2 in the last 72 hours No results found for this basename: CHOL:2,HDL:2,LDLCALC:2,TRIG:2,CHOLHDL:2,LDLDIRECT:2 in the last 72 hours  Basename 08/10/11 1533  TSH 0.527  T4TOTAL --  T3FREE --  THYROIDAB --    Basename 08/10/11 1533  VITAMINB12 --  FOLATE --  FERRITIN 144  TIBC 215*  IRON 46  RETICCTPCT --    Studies/Results: X-ray Chest Pa And Lateral   08/10/2011  *RADIOLOGY REPORT*  Clinical Data: Unexplained tachycardia  CHEST - 2 VIEW  Comparison: 11/01/2010  Findings: Normal heart size and pulmonary vascularity. Calcified tortuous aorta. Lungs appear emphysematous but clear. No pleural effusion or pneumothorax. Prior cervical spine fusion. Bones appear demineralized.  IMPRESSION: Emphysematous changes. No acute abnormalities.  Original Report Authenticated By: Lollie Marrow, M.D.   Ct Angio Chest W/cm &/or Wo Cm  08/10/2011  *RADIOLOGY REPORT*  Clinical Data: Fatigue.  Sinus tachycardia.  Recent history of total knee replacement.  Evaluate for pulmonary embolism.  CT ANGIOGRAPHY CHEST  Technique:  Multidetector CT imaging of the chest using the standard protocol during bolus administration of intravenous contrast. Multiplanar reconstructed images including MIPs were obtained and reviewed  to evaluate the vascular anatomy.  Contrast: 80mL OMNIPAQUE IOHEXOL 350 MG/ML SOLN  Comparison: No priors.  Findings:  Mediastinum: No filling defects within the pulmonary arterial tree to suggest underlying pulmonary embolism. Heart size is normal. There is no significant pericardial fluid, thickening or pericardial calcification. There is atherosclerosis of the thoracic aorta, the great vessels of the mediastinum and the coronary arteries, including calcified atherosclerotic plaque in the left main, left anterior descending and right coronary arteries. No pathologically enlarged mediastinal or hilar lymph nodes. Esophagus is unremarkable in appearance.     Calcifications of the aortic valve and mitral annulus.  Lungs/Pleura: There are a few scattered calcified pulmonary nodules compatible with calcified granulomas.  No suspicious appearing pulmonary nodules or masses are otherwise noted.  No consolidative airspace disease.  No pleural effusions.  Upper Abdomen: There are a few scattered low attenuation hepatic lesions, most of which are too small to definitively characterize. The largest of these lesions is approximately 12 mm in diameter in segment 7, compatible with a cyst.  Musculoskeletal: There are no aggressive appearing lytic or blastic lesions noted in the visualized portions of the skeleton. Orthopedic fixation hardware is incompletely visualized in the lower cervical spine.  IMPRESSION: 1.  No evidence of pulmonary embolism. 2.  No acute findings in the thorax to account for the patient's symptoms. 3. Atherosclerosis, including left main and two-vessel coronary artery disease.  Assessment for potential risk factor modification, dietary therapy or pharmacologic therapy may be warranted, if clinically indicated. 4. There are calcifications of the aortic valve and mitral annulus. Echocardiographic correlation for evaluation of potential valvular dysfunction may be warranted if clinically indicated.  5.  Multifocal low attenuation lesions in the liver are favored to represent small cysts and/or biliary hamartomas.  Original Report Authenticated By: Florencia Reasons, M.D.   Medications: Scheduled Meds:   . aspirin EC  81 mg Oral Daily  . docusate sodium  100 mg Oral BID   Continuous Infusions:  PRN Meds:.acetaminophen, acetaminophen, iohexol, ondansetron (ZOFRAN) IV, ondansetron, sodium chloride, traMADol, zolpidem  Assessment/Plan: Patient Active Problem List   Diagnosis Date Noted  . Tachycardia - defer to cardiology about when or whether or not to use calcium channel blockade at this point or carry on with workup for coronary ischemia prior to  reinstituting calcium channel blockade. She had been on verapamil SR 120 mg daily for migraine prophylaxis 08/10/2011  . OA (osteoarthritis) of knee - recovering well from surgery. D-dimers may have been elevated postoperatively. No longer on anticoagulation therapy postop 07/04/2011     LOS: 1 day   Yehoshua Vitelli NEVILL 08/11/2011, 5:54 AM

## 2011-08-11 NOTE — Progress Notes (Signed)
  Echocardiogram 2D Echocardiogram has been performed.  Janet Wilson 08/11/2011, 11:00 AM

## 2011-08-12 ENCOUNTER — Observation Stay (HOSPITAL_COMMUNITY): Payer: Medicare Other

## 2011-08-12 MED ORDER — TECHNETIUM TC 99M TETROFOSMIN IV KIT
10.0000 | PACK | Freq: Once | INTRAVENOUS | Status: AC | PRN
  Administered 2011-08-12: 10 via INTRAVENOUS

## 2011-08-12 MED ORDER — REGADENOSON 0.4 MG/5ML IV SOLN
0.4000 mg | Freq: Once | INTRAVENOUS | Status: AC
  Administered 2011-08-12: 0.4 mg via INTRAVENOUS
  Filled 2011-08-12: qty 5

## 2011-08-12 MED ORDER — TECHNETIUM TC 99M TETROFOSMIN IV KIT
30.0000 | PACK | Freq: Once | INTRAVENOUS | Status: AC | PRN
  Administered 2011-08-12: 30 via INTRAVENOUS

## 2011-08-12 NOTE — Progress Notes (Signed)
Subjective: Good night no dyspnea or chest pain  Objective: Vital signs in last 24 hours: Temp:  [97.9 F (36.6 C)-98.6 F (37 C)] 98.2 F (36.8 C) (06/30 0504) Pulse Rate:  [64-90] 90  (06/30 0504) Resp:  [18] 18  (06/30 0504) BP: (101-113)/(68-78) 110/75 mmHg (06/30 0504) SpO2:  [95 %-98 %] 98 % (06/30 0504) Weight change:  Last BM Date: 08/10/11  Intake/Output from previous day: 06/29 0701 - 06/30 0700 In: 720 [P.O.:720] Out: -  Intake/Output this shift:    Resp: clear to auscultation bilaterally Cardio: regular rate and rhythm, S1, S2 normal, no murmur, click, rub or gallop  Lab Results:  Basename 08/11/11 0441 08/10/11 1533  WBC 4.8 5.4  HGB 10.2* 10.9*  HCT 31.4* 33.9*  PLT 196 215   BMET  Basename 08/10/11 1533  NA 140  K 4.4  CL 101  CO2 30  GLUCOSE 79  BUN 20  CREATININE 1.00  CALCIUM 9.7    Studies/Results: X-ray Chest Pa And Lateral   08/10/2011  *RADIOLOGY REPORT*  Clinical Data: Unexplained tachycardia  CHEST - 2 VIEW  Comparison: 11/01/2010  Findings: Normal heart size and pulmonary vascularity. Calcified tortuous aorta. Lungs appear emphysematous but clear. No pleural effusion or pneumothorax. Prior cervical spine fusion. Bones appear demineralized.  IMPRESSION: Emphysematous changes. No acute abnormalities.  Original Report Authenticated By: Lollie Marrow, M.D.   Ct Angio Chest W/cm &/or Wo Cm  08/10/2011  *RADIOLOGY REPORT*  Clinical Data: Fatigue.  Sinus tachycardia.  Recent history of total knee replacement.  Evaluate for pulmonary embolism.  CT ANGIOGRAPHY CHEST  Technique:  Multidetector CT imaging of the chest using the standard protocol during bolus administration of intravenous contrast. Multiplanar reconstructed images including MIPs were obtained and reviewed to evaluate the vascular anatomy.  Contrast: 80mL OMNIPAQUE IOHEXOL 350 MG/ML SOLN  Comparison: No priors.  Findings:  Mediastinum: No filling defects within the pulmonary arterial  tree to suggest underlying pulmonary embolism. Heart size is normal. There is no significant pericardial fluid, thickening or pericardial calcification. There is atherosclerosis of the thoracic aorta, the great vessels of the mediastinum and the coronary arteries, including calcified atherosclerotic plaque in the left main, left anterior descending and right coronary arteries. No pathologically enlarged mediastinal or hilar lymph nodes. Esophagus is unremarkable in appearance.    Calcifications of the aortic valve and mitral annulus.  Lungs/Pleura: There are a few scattered calcified pulmonary nodules compatible with calcified granulomas.  No suspicious appearing pulmonary nodules or masses are otherwise noted.  No consolidative airspace disease.  No pleural effusions.  Upper Abdomen: There are a few scattered low attenuation hepatic lesions, most of which are too small to definitively characterize. The largest of these lesions is approximately 12 mm in diameter in segment 7, compatible with a cyst.  Musculoskeletal: There are no aggressive appearing lytic or blastic lesions noted in the visualized portions of the skeleton. Orthopedic fixation hardware is incompletely visualized in the lower cervical spine.  IMPRESSION: 1.  No evidence of pulmonary embolism. 2.  No acute findings in the thorax to account for the patient's symptoms. 3. Atherosclerosis, including left main and two-vessel coronary artery disease.  Assessment for potential risk factor modification, dietary therapy or pharmacologic therapy may be warranted, if clinically indicated. 4. There are calcifications of the aortic valve and mitral annulus. Echocardiographic correlation for evaluation of potential valvular dysfunction may be warranted if clinically indicated.  5.  Multifocal low attenuation lesions in the liver are favored to represent  small cysts and/or biliary hamartomas.  Original Report Authenticated By: Florencia Reasons, M.D.     Medications:  Scheduled:   . aspirin EC  81 mg Oral Daily  . docusate sodium  100 mg Oral BID  . verapamil  120 mg Oral QHS    Assessment/Plan: 1.dyspnea/tachycardia negative evaluation so far except ? Significant coronary disease pharmacologic cardiac nuclear study today  LOS: 2 days   [facility] 08/12/2011, 8:17 AM

## 2011-08-12 NOTE — Progress Notes (Signed)
SUBJECTIVE:  NPO currently for stress test - no complaints  OBJECTIVE:   Vitals:   Filed Vitals:   08/11/11 1427 08/11/11 1501 08/11/11 2053 08/12/11 0504  BP: 101/68 107/68 113/78 110/75  Pulse: 90 64 90 90  Temp: 98.4 F (36.9 C) 98.6 F (37 C) 97.9 F (36.6 C) 98.2 F (36.8 C)  TempSrc:   Oral Oral  Resp: 18 18 18 18   Height:      Weight:      SpO2: 96% 95% 97% 98%   I&O's:   Intake/Output Summary (Last 24 hours) at 08/12/11 0758 Last data filed at 08/11/11 1700  Gross per 24 hour  Intake    720 ml  Output      0 ml  Net    720 ml   TELEMETRY: Reviewed telemetry pt in NSR:     PHYSICAL EXAM General: Well developed, well nourished, in no acute distress  Lungs:   Clear bilaterally to auscultation and percussion. Heart:   HRRR S1 S2 Pulses are 2+ & equal. Abdomen: Bowel sounds are positive, abdomen soft and non-tender without masses  Extremities:   No clubbing, cyanosis or edema.  DP +1 Neuro: Alert and oriented X 3. Psych:  Good affect, responds appropriately   LABS: Basic Metabolic Panel:  Basename 08/10/11 1533  NA 140  K 4.4  CL 101  CO2 30  GLUCOSE 79  BUN 20  CREATININE 1.00  CALCIUM 9.7  MG 2.3  PHOS --   Liver Function Tests:  [facility] 08/10/11 1533  AST 14  ALT 9  ALKPHOS 81  BILITOT 0.3  PROT 7.0  ALBUMIN 3.6   No results found for this basename: LIPASE:2,AMYLASE:2 in the last 72 hours CBC:  Basename 08/11/11 0441 08/10/11 1533  WBC 4.8 5.4  NEUTROABS -- 3.4  HGB 10.2* 10.9*  HCT 31.4* 33.9*  MCV 93.2 93.6  PLT 196 215   Cardiac Enzymes:  Basename 08/11/11 0742 08/10/11 2242 08/10/11 1539  CKTOTAL 42 40 43  CKMB 1.4 1.5 1.5  CKMBINDEX -- -- --  TROPONINI <0.30 <0.30 <0.30   BNP: No components found with this basename: POCBNP:3 D-Dimer:  Basename 08/10/11 1533  DDIMER 2.83*   Hemoglobin A1C: No results found for this basename: HGBA1C in the last 72 hours Fasting Lipid Panel: No results found for this basename:  CHOL,HDL,LDLCALC,TRIG,CHOLHDL,LDLDIRECT in the last 72 hours Thyroid Function Tests:  Basename 08/10/11 1533  TSH 0.527  T4TOTAL --  T3FREE --  THYROIDAB --   Anemia Panel:  Basename 08/10/11 1533  VITAMINB12 --  FOLATE --  FERRITIN 144  TIBC 215*  IRON 46  RETICCTPCT --   Coag Panel:   Lab Results  Component Value Date   INR 1.03 06/27/2011    RADIOLOGY: X-ray Chest Pa And Lateral   08/10/2011  *RADIOLOGY REPORT*  Clinical Data: Unexplained tachycardia  CHEST - 2 VIEW  Comparison: 11/01/2010  Findings: Normal heart size and pulmonary vascularity. Calcified tortuous aorta. Lungs appear emphysematous but clear. No pleural effusion or pneumothorax. Prior cervical spine fusion. Bones appear demineralized.  IMPRESSION: Emphysematous changes. No acute abnormalities.  Original Report Authenticated By: Lollie Marrow, M.D.   Ct Angio Chest W/cm &/or Wo Cm  08/10/2011  *RADIOLOGY REPORT*  Clinical Data: Fatigue.  Sinus tachycardia.  Recent history of total knee replacement.  Evaluate for pulmonary embolism.  CT ANGIOGRAPHY CHEST  Technique:  Multidetector CT imaging of the chest using the standard protocol during bolus administration of intravenous contrast. Multiplanar  reconstructed images including MIPs were obtained and reviewed to evaluate the vascular anatomy.  Contrast: 80mL OMNIPAQUE IOHEXOL 350 MG/ML SOLN  Comparison: No priors.  Findings:  Mediastinum: No filling defects within the pulmonary arterial tree to suggest underlying pulmonary embolism. Heart size is normal. There is no significant pericardial fluid, thickening or pericardial calcification. There is atherosclerosis of the thoracic aorta, the great vessels of the mediastinum and the coronary arteries, including calcified atherosclerotic plaque in the left main, left anterior descending and right coronary arteries. No pathologically enlarged mediastinal or hilar lymph nodes. Esophagus is unremarkable in appearance.     Calcifications of the aortic valve and mitral annulus.  Lungs/Pleura: There are a few scattered calcified pulmonary nodules compatible with calcified granulomas.  No suspicious appearing pulmonary nodules or masses are otherwise noted.  No consolidative airspace disease.  No pleural effusions.  Upper Abdomen: There are a few scattered low attenuation hepatic lesions, most of which are too small to definitively characterize. The largest of these lesions is approximately 12 mm in diameter in segment 7, compatible with a cyst.  Musculoskeletal: There are no aggressive appearing lytic or blastic lesions noted in the visualized portions of the skeleton. Orthopedic fixation hardware is incompletely visualized in the lower cervical spine.  IMPRESSION: 1.  No evidence of pulmonary embolism. 2.  No acute findings in the thorax to account for the patient's symptoms. 3. Atherosclerosis, including left main and two-vessel coronary artery disease.  Assessment for potential risk factor modification, dietary therapy or pharmacologic therapy may be warranted, if clinically indicated. 4. There are calcifications of the aortic valve and mitral annulus. Echocardiographic correlation for evaluation of potential valvular dysfunction may be warranted if clinically indicated.  5.  Multifocal low attenuation lesions in the liver are favored to represent small cysts and/or biliary hamartomas.  Original Report Authenticated By: Florencia Reasons, M.D.      ASSESSMENT:  1. Tachycardia improved. Hemoglobin stable. No evidence of PE on chest CT,  2. LBBB - rate dependent.  3. Exertional fatigue with coronary artery calcifications on chest CT and new LBBB   PLAN:   1.  Nuclear stress test today  Quintella Reichert, MD  08/12/2011  7:58 AM

## 2011-08-12 NOTE — Progress Notes (Signed)
Pt. Discharged 08/12/2011  5:58 PM Discharge instructions reviewed with patient/family. Patient/family verbalized understanding.  Questions answered as needed. Pt. Discharged to home with family/self.  Janet Wilson

## 2011-08-12 NOTE — Discharge Summary (Signed)
Physician Discharge Summary  Patient ID: Janet Wilson MRN: [id number] DOB/AGE: 75/21/1936 75 y.o.  Admit date: 08/10/2011 Discharge date: 08/12/2011  Admission Diagnoses:dyspnea, tachycardia   Discharge Diagnoses: dyspnea unclear etiology Active Problems:  * No active hospital problems. *     Discharged Condition: improved  Hospital Course: The patient was admitted with dyspnea and tachycardia.  Initially it was presumed she was more anemic but this has been stable.  She had a ct angio which was negative for PE and cardiac markers were negative for ischemia. However ct angio was suggestive of significant coronary disease.  On the day of discharge she had a pharmacologic stress test which was negative for ischemia and revealed normal EF.  Her pulse rate has come down to 90 range and no further dyspnea  Consults: Cardiology  Significant Diagnostic Studies: Pharmacologic stress test  Treatments: None  Discharge Exam: Blood pressure 124/81, pulse 92, temperature 98.2 F (36.8 C), temperature source Oral, resp. rate 18, height 5\' 4"  (1.626 m), weight 54.8 kg (120 lb 13 oz), SpO2 97.00%. Resp: clear to auscultation bilaterally normal heart and lung exam    Disposition: 06-Home-Health Care Svc   Medication List  As of 08/12/2011  4:48 PM   STOP taking these medications         nitrofurantoin (macrocrystal-monohydrate) 100 MG capsule         TAKE these medications         aspirin 81 MG chewable tablet   Chew 81 mg by mouth daily.      brinzolamide 1 % ophthalmic suspension   Commonly known as: AZOPT   Place 1 drop into the left eye 2 (two) times daily.      diazepam 5 MG tablet   Commonly known as: VALIUM   Take 5 mg by mouth at bedtime.      iron polysaccharides 150 MG capsule   Commonly known as: NIFEREX   Take 1 capsule (150 mg total) by mouth daily.      magnesium oxide 400 MG tablet   Commonly known as: MAG-OX   Take 400 mg by mouth daily with  breakfast.      omega-3 acid ethyl esters 1 G capsule   Commonly known as: LOVAZA   Take 1 g by mouth daily.      omeprazole 20 MG capsule   Commonly known as: PRILOSEC   Take 20 mg by mouth daily with breakfast.      traMADol 50 MG tablet   Commonly known as: ULTRAM   Take 50 mg by mouth every 6 (six) hours as needed. For pain      verapamil 120 MG 24 hr capsule   Commonly known as: VERELAN PM   Take 120 mg by mouth at bedtime.      VITAMIN C PO   Take 1 tablet by mouth daily.      VITAMIN D (CHOLECALCIFEROL) PO   Take 1 tablet by mouth daily.      zolmitriptan 5 MG tablet   Commonly known as: ZOMIG   Take 5 mg by mouth daily as needed. For migraine             Signed: Ginette Otto 08/12/2011, 4:48 PM

## 2011-08-13 NOTE — Progress Notes (Signed)
UR Completed.  Colt Martelle Jane [phone] 08/13/2011  

## 2011-08-15 ENCOUNTER — Emergency Department (HOSPITAL_COMMUNITY)
Admission: EM | Admit: 2011-08-15 | Discharge: 2011-08-15 | Disposition: A | Discharge status: Home / Self Care | Payer: Medicare Other | Attending: Emergency Medicine | Admitting: Emergency Medicine

## 2011-08-15 ENCOUNTER — Encounter (HOSPITAL_COMMUNITY): Payer: Self-pay

## 2011-08-15 DIAGNOSIS — Z79899 Other long term (current) drug therapy: Secondary | ICD-10-CM | POA: Insufficient documentation

## 2011-08-15 DIAGNOSIS — I10 Essential (primary) hypertension: Secondary | ICD-10-CM | POA: Insufficient documentation

## 2011-08-15 DIAGNOSIS — R110 Nausea: Secondary | ICD-10-CM | POA: Insufficient documentation

## 2011-08-15 DIAGNOSIS — Z8739 Personal history of other diseases of the musculoskeletal system and connective tissue: Secondary | ICD-10-CM | POA: Insufficient documentation

## 2011-08-15 DIAGNOSIS — R5381 Other malaise: Secondary | ICD-10-CM | POA: Insufficient documentation

## 2011-08-15 DIAGNOSIS — K219 Gastro-esophageal reflux disease without esophagitis: Secondary | ICD-10-CM | POA: Insufficient documentation

## 2011-08-15 DIAGNOSIS — Z7982 Long term (current) use of aspirin: Secondary | ICD-10-CM | POA: Insufficient documentation

## 2011-08-15 DIAGNOSIS — R5383 Other fatigue: Secondary | ICD-10-CM

## 2011-08-15 LAB — COMPREHENSIVE METABOLIC PANEL
ALT: 9 U/L (ref 0–35)
AST: 14 U/L (ref 0–37)
Albumin: 3.7 g/dL (ref 3.5–5.2)
Alkaline Phosphatase: 73 U/L (ref 39–117)
BUN: 13 mg/dL (ref 6–23)
CO2: 26 mEq/L (ref 19–32)
Calcium: 9.3 mg/dL (ref 8.4–10.5)
Chloride: 103 mEq/L (ref 96–112)
Creatinine, Ser: 1 mg/dL (ref 0.50–1.10)
GFR calc Af Amer: 62 mL/min — ABNORMAL LOW (ref >90)
GFR calc non Af Amer: 54 mL/min — ABNORMAL LOW (ref >90)
Glucose, Bld: 97 mg/dL (ref 70–99)
Potassium: 3.5 mEq/L (ref 3.5–5.1)
Sodium: 140 mEq/L (ref 135–145)
Total Bilirubin: 0.3 mg/dL (ref 0.3–1.2)
Total Protein: 6.8 g/dL (ref 6.0–8.3)

## 2011-08-15 LAB — LIPASE, BLOOD: Lipase: 65 U/L — ABNORMAL HIGH (ref 11–59)

## 2011-08-15 LAB — URINALYSIS, ROUTINE W REFLEX MICROSCOPIC
Bilirubin Urine: NEGATIVE
Glucose, UA: NEGATIVE mg/dL
Hgb urine dipstick: NEGATIVE
Ketones, ur: NEGATIVE mg/dL
Leukocytes, UA: NEGATIVE
Nitrite: NEGATIVE
Protein, ur: NEGATIVE mg/dL
Specific Gravity, Urine: 1.008 (ref 1.005–1.030)
Urobilinogen, UA: 0.2 mg/dL (ref 0.0–1.0)
pH: 7 (ref 5.0–8.0)

## 2011-08-15 LAB — TROPONIN I: Troponin I: <0.3 ng/mL (ref <0.30)

## 2011-08-15 MED ORDER — SODIUM CHLORIDE 0.9 % IV BOLUS (SEPSIS)
1000.0000 mL | Freq: Once | INTRAVENOUS | Status: AC
  Administered 2011-08-15: 1000 mL via INTRAVENOUS

## 2011-08-15 MED ORDER — ONDANSETRON HCL 4 MG/2ML IJ SOLN
4.0000 mg | Freq: Once | INTRAMUSCULAR | Status: AC
  Administered 2011-08-15: 4 mg via INTRAVENOUS
  Filled 2011-08-15: qty 2

## 2011-08-15 MED ORDER — PROMETHAZINE HCL 25 MG PO TABS: 12.5000 mg | ORAL_TABLET | Freq: Four times a day (QID) | ORAL | Status: DC | PRN

## 2011-08-15 NOTE — ED Notes (Signed)
Pt and family verbalize concerns with discharge home. Dr Christie Beckers informed and spoke with pt and family. Daughter continues to verbalize dissatisfaction with discharge, attempt reinforce discharge instructions. WC to lobby. Pt alertx3 NAD noted.

## 2011-08-15 NOTE — ED Provider Notes (Signed)
History    77yF with fatigue. Most of history is actually from husband and other family members interjecting.  Pt seems to prefer to defer most questioning to them. Patient reports continued fatigue and nausea since her recent R knee replacement. She reports that her pain has progressively been improving and she is getting rehabilitation. Patient reports very low energy levels and intermittent nausea. No abdominal pain. No vomiting. Approximately 12 pound unintentional weight loss. No fevers or chills. No shortness of breath. No diarrhea. No urinary complaints. Patient was just recently admitted to the hospital for similar complaints as well as tachycardia at that time. She was evaluated by cardiology and had a fairly unremarkable workup at that time. Presenting today because of continued symptoms.   CSN: [id number]  Arrival date & time 08/15/11  1519   First MD Initiated Contact with Patient 08/15/11 1543      Chief Complaint  Patient presents with  . Fatigue    (Consider location/radiation/quality/duration/timing/severity/associated sxs/prior treatment) HPI  Past Medical History  Diagnosis Date  . Overactive bladder   . GERD (gastroesophageal reflux disease)   . Goiter     CAUSING COUGH, HOARSINESS AND DRY THROAT  . Headache     MIGRAINES - TAKES VERAPAMIL FOR PREVENTION OF MIGRAINES  . Arthritis     OA BOTH KNEES AND HANDS  . Hypertension     Past Surgical History  Procedure Date  . Cervical fusion 2012  . Left shoulder surgery   . Incontinence surgery   . Right knee arthroscopy  11/2009   . Eye surgery     CATARACT EXTRACTION- BILATERAL  . Total knee arthroplasty 07/04/2011    Procedure: TOTAL KNEE ARTHROPLASTY;  Surgeon: Loanne Drilling, MD;  Location: [facility];  Service: Orthopedics;  Laterality: Right;    History reviewed. No pertinent family history.  History  Substance Use Topics  . Smoking status: Never Smoker   . Smokeless tobacco: Not on file  . Alcohol Use:  Yes     SELDOM    OB History    Grav Para Term Preterm Abortions TAB SAB Ect Mult Living                  Review of Systems   Review of symptoms negative unless otherwise noted in HPI.   Allergies  Review of patient's allergies indicates no known allergies.  Home Medications   Current Outpatient Rx  Name Route Sig Dispense Refill  . VITAMIN C PO Oral Take 1 tablet by mouth daily.    . ASPIRIN 81 MG PO CHEW Oral Chew 81 mg by mouth daily.    Marland Kitchen BRINZOLAMIDE 1 % OP SUSP Left Eye Place 1 drop into the left eye 2 (two) times daily.    Marland Kitchen DIAZEPAM 5 MG PO TABS Oral Take 5 mg by mouth at bedtime.     Marland Kitchen POLYSACCHARIDE IRON COMPLEX 150 MG PO CAPS Oral Take 1 capsule (150 mg total) by mouth daily. 21 capsule 0  . MAGNESIUM OXIDE 400 MG PO TABS Oral Take 400 mg by mouth daily with breakfast.    . OMEGA-3-ACID ETHYL ESTERS 1 G PO CAPS Oral Take 1 g by mouth daily.    Marland Kitchen OMEPRAZOLE 20 MG PO CPDR Oral Take 20 mg by mouth daily with breakfast.    . ONDANSETRON HCL 4 MG PO TABS Oral Take 4 mg by mouth every 8 (eight) hours as needed. For nausea    . PAROXETINE HCL 10 MG PO  TABS Oral Take 10 mg by mouth every morning.    Marland Kitchen TRAMADOL HCL 50 MG PO TABS Oral Take 50 mg by mouth every 6 (six) hours as needed. For pain    . VERAPAMIL HCL ER 120 MG PO CP24 Oral Take 120 mg by mouth at bedtime.    Marland Kitchen VITAMIN D (CHOLECALCIFEROL) PO Oral Take 1 tablet by mouth daily.    Marland Kitchen ZOLMITRIPTAN 5 MG PO TABS Oral Take 5 mg by mouth daily as needed. For migraine      BP 119/74  Pulse 95  Temp 98 F (36.7 C)  Resp 16  Ht 5\' 4"  (1.626 m)  Wt 109 lb (49.442 kg)  BMI 18.71 kg/m2  SpO2 97%  Physical Exam  Nursing note and vitals reviewed. Constitutional: She is oriented to person, place, and time. She appears well-developed and well-nourished. No distress.       Laying in bed. NAD.  HENT:  Head: Normocephalic and atraumatic.  Mouth/Throat: Oropharynx is clear and moist.  Eyes: Conjunctivae and EOM are  normal. Pupils are equal, round, and reactive to light. Right eye exhibits no discharge. Left eye exhibits no discharge.  Neck: Neck supple.  Cardiovascular: Normal rate, regular rhythm and normal heart sounds.  Exam reveals no gallop and no friction rub.   No murmur heard. Pulmonary/Chest: Effort normal and breath sounds normal. No respiratory distress.  Abdominal: Soft. She exhibits no distension. There is no tenderness.  Musculoskeletal: She exhibits no edema and no tenderness.       Mild swelling R knee. Incision intact and appears to be healing well. Minimal pain with RON. Neurovasculary intact distally.  Neurological: She is alert and oriented to person, place, and time. No cranial nerve deficit. She exhibits normal muscle tone. Coordination normal.  Skin: Skin is warm and dry. No rash noted. She is not diaphoretic. No erythema. No pallor.  Psychiatric: Her behavior is normal. Thought content normal.    ED Course  Procedures (including critical care time)  Labs Reviewed  COMPREHENSIVE METABOLIC PANEL - Abnormal; Notable for the following:    GFR calc non Af Amer 54 (*)     GFR calc Af Amer 62 (*)     All other components within normal limits  LIPASE, BLOOD - Abnormal; Notable for the following:    Lipase 65 (*)     All other components within normal limits  URINALYSIS, ROUTINE W REFLEX MICROSCOPIC  TROPONIN I   No results found.  EKG:  Rhythm: nsr Axis: left Rate: 92 Intervals: normal ST segments: NS ST changes   1. Fatigue   2. Nausea       MDM  77 year old female with multiple complaints. Chief complaint of which is fatigue. Recent anemia, but improving since her surgery. CBC from 4 days ago showed hemoglobin 10.2 which has been stable. She's not complaining of any symptoms of ongoing bleeding. During her recent admission she had a CT angiogram which did not show any evidence for pulmonary embolism. She had cardiac markers then which were negative. The troponin  here today was negative as well. She is not complaining of any chest pain or shortness of breath. She's complaining of nausea, but no abdominal pain. She has a benign abdominal examination. She does have mild elevation of her lipase, the significance of which is not certain at this time. During her recent admission she also had a stress echocardiogram which showed a normal ejection fraction and no evidence of ischemia. She reports episodes  of tachycardia, but she has been in a sinus rhythm with a regular rate in the emergency room. Her EKG today is nonprovocative. Apparently someone mentioned possibility of gastric cancer. Presume this is because of the nausea and vomiting, fatigue and weight loss. Patient has been referred to gastroenterology, Dr Loreta Ave. Feel that they can further evaluate for this if they feel necessary as an outpatient. Without abdominal pain and benign exam do not feel that needs emergent imaging. Obstruction considered but doubt. She is also been evaluated by endocrinology today and labs were drawn, although she is not sure of specifics. Suspect some component of depression. Son pulled me aside and reported that there has been some marital discord pertaining to possible move from patient's current home.  Although patient has been receiving rehabilitation for knee there may be some element of deconditioning as well from her recent surgery. UA is not suggestive of infection. Patient is hemodynamically stable. Multiple family members at bedside and obviously very concerned, but at this time I have a low suspicion for emergent etiology of her constellation of symptoms and feel that she is appropriate for discharge. Family unhappy with discharge. They feel she needs to be admitted to be worked up further. Despite extended conversation could sense that still remain frustrated. Return precautions discussed.         Raeford Razor, MD 08/15/11 [phone]

## 2011-08-15 NOTE — ED Notes (Addendum)
Medication given as ordered for "funny feeling in abdomen" denies nausea. Pt drinking water, instructed to hold until medication takes effect.

## 2011-08-15 NOTE — ED Notes (Signed)
Pt states she continues to have an uneasy feeling in her stomach. Family remains at pt bedside.

## 2011-08-15 NOTE — ED Notes (Signed)
PT arrived via ems c/o of fatigue for 5 weeks s/p knee replacement. Pt denies any new symptoms at this time. Hospitalized over the weekend for rapid heart rate of 128 released on Monday. Contacted cardiologist today related to continued and worsening of weakness instructed to call ems. C/o of nausea without vomiting, denies cp or syncope. Pt mae randomly speech clear alertx3.

## 2011-08-20 ENCOUNTER — Ambulatory Visit
Admission: RE | Admit: 2011-08-20 | Discharge: 2011-08-20 | Disposition: A | Discharge status: Home / Self Care | Payer: Medicare Other | Source: Ambulatory Visit | Attending: Gastroenterology | Admitting: Gastroenterology

## 2011-08-20 ENCOUNTER — Other Ambulatory Visit: Payer: Self-pay | Admitting: Gastroenterology

## 2011-08-20 DIAGNOSIS — R634 Abnormal weight loss: Secondary | ICD-10-CM

## 2011-08-20 DIAGNOSIS — R110 Nausea: Secondary | ICD-10-CM

## 2011-08-20 MED ORDER — IOHEXOL 300 MG/ML  SOLN
10.0000 mL | Freq: Once | INTRAMUSCULAR | Status: AC | PRN
  Administered 2011-08-20: 10 mL via ORAL

## 2011-08-20 MED ORDER — IOHEXOL 300 MG/ML  SOLN
100.0000 mL | Freq: Once | INTRAMUSCULAR | Status: AC | PRN
  Administered 2011-08-20: 100 mL via INTRAVENOUS

## 2011-08-24 ENCOUNTER — Other Ambulatory Visit: Payer: Self-pay | Admitting: Gastroenterology

## 2011-08-24 DIAGNOSIS — R110 Nausea: Secondary | ICD-10-CM

## 2011-08-30 ENCOUNTER — Ambulatory Visit (HOSPITAL_COMMUNITY)
Admission: RE | Admit: 2011-08-30 | Discharge: 2011-08-30 | Disposition: A | Discharge status: Home / Self Care | Payer: Medicare Other | Source: Ambulatory Visit | Attending: Gastroenterology | Admitting: Gastroenterology

## 2011-08-30 DIAGNOSIS — R110 Nausea: Secondary | ICD-10-CM | POA: Insufficient documentation

## 2011-08-30 MED ORDER — TECHNETIUM TC 99M SULFUR COLLOID
2.0000 | Freq: Once | INTRAVENOUS | Status: AC
  Administered 2011-08-30: 2 via INTRAVENOUS

## 2011-09-24 DIAGNOSIS — Z8249 Family history of ischemic heart disease and other diseases of the circulatory system: Secondary | ICD-10-CM | POA: Insufficient documentation

## 2011-09-24 DIAGNOSIS — Z96659 Presence of unspecified artificial knee joint: Secondary | ICD-10-CM | POA: Insufficient documentation

## 2012-06-17 ENCOUNTER — Other Ambulatory Visit: Payer: Self-pay | Admitting: Internal Medicine

## 2012-06-17 DIAGNOSIS — R51 Headache: Secondary | ICD-10-CM

## 2012-06-18 ENCOUNTER — Emergency Department (HOSPITAL_COMMUNITY)
Admission: EM | Admit: 2012-06-18 | Discharge: 2012-06-18 | Disposition: A | Discharge status: Home / Self Care | Payer: Medicare Other | Attending: Emergency Medicine | Admitting: Emergency Medicine

## 2012-06-18 ENCOUNTER — Emergency Department (HOSPITAL_COMMUNITY): Payer: Medicare Other

## 2012-06-18 ENCOUNTER — Inpatient Hospital Stay
Admission: RE | Admit: 2012-06-18 | Discharge: 2012-06-18 | Disposition: A | Discharge status: Home / Self Care | Payer: Medicare Other | Source: Ambulatory Visit | Attending: Internal Medicine | Admitting: Internal Medicine

## 2012-06-18 ENCOUNTER — Other Ambulatory Visit: Payer: Self-pay

## 2012-06-18 ENCOUNTER — Encounter (HOSPITAL_COMMUNITY): Payer: Self-pay | Admitting: *Deleted

## 2012-06-18 DIAGNOSIS — G43909 Migraine, unspecified, not intractable, without status migrainosus: Secondary | ICD-10-CM | POA: Insufficient documentation

## 2012-06-18 DIAGNOSIS — Z7982 Long term (current) use of aspirin: Secondary | ICD-10-CM | POA: Insufficient documentation

## 2012-06-18 DIAGNOSIS — Z8639 Personal history of other endocrine, nutritional and metabolic disease: Secondary | ICD-10-CM | POA: Insufficient documentation

## 2012-06-18 DIAGNOSIS — Z79899 Other long term (current) drug therapy: Secondary | ICD-10-CM | POA: Insufficient documentation

## 2012-06-18 DIAGNOSIS — I10 Essential (primary) hypertension: Secondary | ICD-10-CM | POA: Insufficient documentation

## 2012-06-18 DIAGNOSIS — Z87448 Personal history of other diseases of urinary system: Secondary | ICD-10-CM | POA: Insufficient documentation

## 2012-06-18 DIAGNOSIS — K219 Gastro-esophageal reflux disease without esophagitis: Secondary | ICD-10-CM | POA: Insufficient documentation

## 2012-06-18 DIAGNOSIS — Z862 Personal history of diseases of the blood and blood-forming organs and certain disorders involving the immune mechanism: Secondary | ICD-10-CM | POA: Insufficient documentation

## 2012-06-18 DIAGNOSIS — R079 Chest pain, unspecified: Secondary | ICD-10-CM | POA: Insufficient documentation

## 2012-06-18 DIAGNOSIS — Z8739 Personal history of other diseases of the musculoskeletal system and connective tissue: Secondary | ICD-10-CM | POA: Insufficient documentation

## 2012-06-18 DIAGNOSIS — R51 Headache: Secondary | ICD-10-CM

## 2012-06-18 LAB — CBC
HCT: 33.9 % — ABNORMAL LOW (ref 36.0–46.0)
Hemoglobin: 11.6 g/dL — ABNORMAL LOW (ref 12.0–15.0)
MCH: 30.4 pg (ref 26.0–34.0)
MCHC: 34.2 g/dL (ref 30.0–36.0)
MCV: 88.7 fL (ref 78.0–100.0)
Platelets: 241 10*3/uL (ref 150–400)
RBC: 3.82 MIL/uL — ABNORMAL LOW (ref 3.87–5.11)
RDW: 12.9 % (ref 11.5–15.5)
WBC: 8.9 10*3/uL (ref 4.0–10.5)

## 2012-06-18 LAB — POCT I-STAT TROPONIN I: Troponin i, poc: 0 ng/mL (ref 0.00–0.08)

## 2012-06-18 LAB — BASIC METABOLIC PANEL
BUN: 17 mg/dL (ref 6–23)
CO2: 28 mEq/L (ref 19–32)
Calcium: 9 mg/dL (ref 8.4–10.5)
Chloride: 98 mEq/L (ref 96–112)
Creatinine, Ser: 0.76 mg/dL (ref 0.50–1.10)
GFR calc Af Amer: >90 mL/min (ref >90)
GFR calc non Af Amer: 80 mL/min — ABNORMAL LOW (ref >90)
Glucose, Bld: 160 mg/dL — ABNORMAL HIGH (ref 70–99)
Potassium: 3.8 mEq/L (ref 3.5–5.1)
Sodium: 134 mEq/L — ABNORMAL LOW (ref 135–145)

## 2012-06-18 MED ORDER — OXYCODONE-ACETAMINOPHEN 5-325 MG PO TABS: 1.0000 | ORAL_TABLET | ORAL | Status: DC | PRN

## 2012-06-18 MED ORDER — ONDANSETRON 8 MG PO TBDP: ORAL_TABLET | ORAL | Status: DC

## 2012-06-18 MED ORDER — PREDNISONE 20 MG PO TABS: ORAL_TABLET | ORAL | Status: DC

## 2012-06-18 MED ORDER — GADOBENATE DIMEGLUMINE 529 MG/ML IV SOLN
10.0000 mL | Freq: Once | INTRAVENOUS | Status: AC
  Administered 2012-06-18: 10 mL via INTRAVENOUS

## 2012-06-18 NOTE — ED Notes (Signed)
Report to chris, RN

## 2012-06-18 NOTE — ED Provider Notes (Addendum)
History     CSN: [id number]  Arrival date & time 06/18/12  4098   First MD Initiated Contact with Patient 06/18/12 [phone]      Chief Complaint  Patient presents with  . Chest Pain  . Headache    (Consider location/radiation/quality/duration/timing/severity/associated sxs/prior treatment) HPI....  temporal headache for 2 weeks with radiation to right jaw and right neck. Patient has been seen by primary care physician who diagnosed trigeminal neuralgia. Pain is sharp and moderate. No meningeal signs, fever, sweats, chills, weight loss..  Also minimal superior chest pain associated with movement and palpation.  B12 level noted to be elevated recently.  Rx carbamazepine for headache which makes patient dizzy  Past Medical History  Diagnosis Date  . Overactive bladder   . GERD (gastroesophageal reflux disease)   . Goiter     CAUSING COUGH, HOARSINESS AND DRY THROAT  . Headache     MIGRAINES - TAKES VERAPAMIL FOR PREVENTION OF MIGRAINES  . Arthritis     OA BOTH KNEES AND HANDS  . Hypertension     Past Surgical History  Procedure Laterality Date  . Cervical fusion  2012  . Left shoulder surgery    . Incontinence surgery    . Right knee arthroscopy  11/2009    . Eye surgery      CATARACT EXTRACTION- BILATERAL  . Total knee arthroplasty  07/04/2011    Procedure: TOTAL KNEE ARTHROPLASTY;  Surgeon: Loanne Drilling, MD;  Location: [facility];  Service: Orthopedics;  Laterality: Right;    History reviewed. No pertinent family history.  History  Substance Use Topics  . Smoking status: Never Smoker   . Smokeless tobacco: Not on file  . Alcohol Use: Yes     Comment: SELDOM    OB History   Grav Para Term Preterm Abortions TAB SAB Ect Mult Living                  Review of Systems  All other systems reviewed and are negative.    Allergies  Oxycodone  Home Medications   Current Outpatient Rx  Name  Route  Sig  Dispense  Refill  . Ascorbic Acid (VITAMIN C PO)   Oral  Take 1 tablet by mouth daily.         Marland Kitchen aspirin EC 81 MG tablet   Oral   Take 81 mg by mouth daily.         . Biotin (BIOTIN 5000) 5 MG CAPS   Oral   Take 1 capsule by mouth daily.         . brinzolamide (AZOPT) 1 % ophthalmic suspension   Left Eye   Place 1 drop into the left eye 2 (two) times daily.         . Calcium Carbonate-Vitamin D (CALCIUM PLUS VITAMIN D PO)   Oral   Take 1 tablet by mouth daily.         . carbamazepine (TEGRETOL) 100 MG chewable tablet   Oral   Chew 100-200 mg by mouth 2 (two) times daily.         . cholecalciferol (VITAMIN D) 1000 UNITS tablet   Oral   Take 1,000 Units by mouth daily.         . Coenzyme Q10 (CO Q 10) 100 MG CAPS   Oral   Take 1 tablet by mouth daily.         . diazepam (VALIUM) 5 MG tablet   Oral  Take 2.5 mg by mouth at bedtime.          Marland Kitchen estradiol (ESTRACE) 0.1 MG/GM vaginal cream   Vaginal   Place 2 g vaginally once a week.         . magnesium oxide (MAG-OX) 400 MG tablet   Oral   Take 400 mg by mouth daily with breakfast.         . Omega-3 Fatty Acids (FISH OIL) 1200 MG CAPS   Oral   Take 1 capsule by mouth daily.         Marland Kitchen omeprazole (PRILOSEC) 20 MG capsule   Oral   Take 20 mg by mouth daily with breakfast.         . PRESCRIPTION MEDICATION   Nasal   Place 1-2 sprays into the nose. Lidocaine HCL Isotonic 4% Nasal spray (compounded spray)         . Probiotic Product (PROBIOTIC PO)   Oral   Take 1 capsule by mouth daily.         Marland Kitchen zolmitriptan (ZOMIG) 5 MG tablet   Oral   Take 5 mg by mouth daily as needed for migraine. May repeat after 2 hours if headache returns.         . nitrofurantoin, macrocrystal-monohydrate, (MACROBID) 100 MG capsule   Oral   Take 100 mg by mouth 2 (two) times daily as needed (bladder infection).         . ondansetron (ZOFRAN ODT) 8 MG disintegrating tablet      8mg  ODT q4 hours prn nausea   10 tablet   0   . oxyCODONE-acetaminophen  (PERCOCET) 5-325 MG per tablet   Oral   Take 1 tablet by mouth every 4 (four) hours as needed for pain.   20 tablet   0   . predniSONE (DELTASONE) 20 MG tablet      3 tabs po day one, then 2 po daily x 4 days   11 tablet   0     BP 102/74  Pulse 95  Temp(Src) 97.6 F (36.4 C) (Oral)  Resp 20  SpO2 98%  Physical Exam  Nursing note and vitals reviewed. Constitutional: She is oriented to person, place, and time. She appears well-developed and well-nourished.  HENT:  Head: Normocephalic and atraumatic.  Eyes: Conjunctivae and EOM are normal. Pupils are equal, round, and reactive to light.  Neck: Normal range of motion. Neck supple.  Cardiovascular: Normal rate, regular rhythm and normal heart sounds.   Pulmonary/Chest: Effort normal and breath sounds normal.  Abdominal: Soft. Bowel sounds are normal.  Musculoskeletal: Normal range of motion.  Neurological: She is alert and oriented to person, place, and time.  Skin: Skin is warm and dry.  Psychiatric: She has a normal mood and affect.    ED Course  Procedures (including critical care time)  Labs Reviewed  CBC - Abnormal; Notable for the following:    RBC 3.82 (*)    Hemoglobin 11.6 (*)    HCT 33.9 (*)    All other components within normal limits  BASIC METABOLIC PANEL - Abnormal; Notable for the following:    Sodium 134 (*)    Glucose, Bld 160 (*)    GFR calc non Af Amer 80 (*)    All other components within normal limits  POCT I-STAT TROPONIN I   Dg Chest 2 View  06/18/2012  *RADIOLOGY REPORT*  Clinical Data: Chest pain and headache.  CHEST - 2 VIEW  Comparison: 08/10/2011  Findings: The lungs are clear and show no evidence of infiltrate, edema or nodule.  No pleural fluid is identified.  Heart size and mediastinal contours within normal limits.  Bony thorax is unremarkable.  IMPRESSION: No active disease.   Original Report Authenticated By: Irish Lack, M.D.    Mr Laqueta Jean Wo Contrast  06/18/2012  *RADIOLOGY  REPORT*  Clinical Data: Worsening right temporal headache.  History high blood pressure.  MRI HEAD WITHOUT AND WITH CONTRAST  Technique:  Multiplanar, multiecho pulse sequences of the brain and surrounding structures were obtained according to standard protocol without and with intravenous contrast  Contrast: 10mL MULTIHANCE GADOBENATE DIMEGLUMINE 529 MG/ML IV SOLN  Comparison: None.  Findings: No acute infarct.  No intracranial hemorrhage.  No intracranial mass lesion or abnormal enhancement.  Mild small vessel disease type changes.  Mild atrophy without hydrocephalus.  Major intracranial vascular structures are patent.  Cervical medullary junction, pituitary region, pineal region and orbital structures within normal limits.  Minimal mucosal thickening ethmoid sinus air cells.  Fluid surrounds the occiput - C1 and C1-2 articulation consistent with degenerative changes.  IMPRESSION: No acute abnormality.  Please see above.   Original Report Authenticated By: Lacy Duverney, M.D.     Date: 06/18/2012  Rate: 95  Rhythm: normal sinus rhythm  QRS Axis: normal  Intervals: normal  ST/T Wave abnormalities: normal  Conduction Disutrbances: none  Narrative Interpretation: unremarkable     1. Headache       MDM  No neuro deficits. MRI of brain with and without contrast negative.   Rx prednisone for 5 days, Percocet #20, Zofran 8 mg ODT #10.   Referral to neurologist and  primary care        Donnetta Hutching, MD 06/18/12 1247  Donnetta Hutching, MD 06/22/12 1557  Donnetta Hutching, MD 06/22/12 1558  Donnetta Hutching, MD 06/22/12 1600  Donnetta Hutching, MD 06/26/12 1453

## 2012-06-18 NOTE — ED Notes (Signed)
Pt. Returned from having a CXR.

## 2012-06-18 NOTE — ED Notes (Signed)
Pt reports having severe headaches x 2 weeks, started having mid chest pains two days ago. ekg done at triage. No acute distress noted. Received call from dr gates, edp to eval pt for possible admission.

## 2012-06-18 NOTE — ED Notes (Signed)
Pt. Stated, I've had a headache for 2 weeks and Dr. Kevan Ny is suppose to order a MRI .  Chest xray completed. Dr. Kevan Ny is suppose to come.

## 2012-06-21 ENCOUNTER — Other Ambulatory Visit: Payer: Medicare Other

## 2012-06-30 ENCOUNTER — Other Ambulatory Visit: Payer: Self-pay | Admitting: *Deleted

## 2012-06-30 ENCOUNTER — Encounter: Payer: Self-pay | Admitting: Diagnostic Neuroimaging

## 2012-06-30 ENCOUNTER — Ambulatory Visit (INDEPENDENT_AMBULATORY_CARE_PROVIDER_SITE_OTHER): Payer: Medicare Other | Admitting: Diagnostic Neuroimaging

## 2012-06-30 VITALS — BP 105/74 | HR 98 | Temp 98.3°F | Ht 63.0 in | Wt 118.0 lb

## 2012-06-30 DIAGNOSIS — R51 Headache: Secondary | ICD-10-CM

## 2012-06-30 DIAGNOSIS — R519 Headache, unspecified: Secondary | ICD-10-CM | POA: Insufficient documentation

## 2012-06-30 NOTE — Patient Instructions (Addendum)
Labwork today.  We will call you with results. Return for follow up in 1 month.

## 2012-06-30 NOTE — Progress Notes (Signed)
[facility]  PATIENT: Janet Wilson DOB: 09-Sep-1934  REFERRING CLINICIAN: Marden Noble HISTORY FROM: patient and husband REASON FOR VISIT: right-sided headaches with jaw pain   HISTORICAL  CHIEF COMPLAINT:  Chief Complaint  Patient presents with  . Neurologic Problem    Pain in jaw, nck pain  . Headache    severe Headache    HISTORY OF PRESENT ILLNESS:  [name] is a 78 year old white lady with c/o worsening right temporal headaches that started 5 weeks ago.   Since that time she has been experiencing extreme fatigue and steady weight loss.  She describes right sided headache, with sharp pain and pain with chewing.  She is also experiencing throat/neck pain intermittently. She has seen her dentist to r/o TMJ, dentist did not think pain was coming from TMJ.  Went to the PCP on 06/12/12, with headache and rx carbamazepine, but stopped due to dizziness and lack of effect. Went to ER on 06/18/12 with HA and chest heaviness.  Spouse states that the veins over her right forehead were enlarged and pulsating. Cardiac workup negative.  MRI of brain was unremarkable. Was d/c'd from hospital with a 4 day course of prednisone which did not relieve the pain.    Since the beginning 5 weeks ago, she feels like the pain was the worst at the beginning, then tapered off some but had intermittent sharp jabbing pains daily with residual soreness, and today in office feels slightly better.  Migraines since a teenager, went to headache clinic here in [place] years ago.  Typical migraine was right-sided throbbing over the right eye. She had an aura of a "different feeling" but no specific vision changes. Photophobia and phonophobia typical, seldom nausea.  Was controlled with Zomig.  She states she had migraines a few times a year, last one being about a year ago.    She states she lost her sense of smell about 20 years ago, preceded by smelling things that were not really  there, such as garlic.  She has glaucoma in the left eye, and cataracts removed in both eyes.  REVIEW OF SYSTEMS: Full 14 system review of systems performed and notable only for fatigue, headache.  ALLERGIES: Allergies  Allergen Reactions  . Oxycodone Anaphylaxis    HOME MEDICATIONS: Outpatient Prescriptions Prior to Visit  Medication Sig Dispense Refill  . aspirin EC 81 MG tablet Take 81 mg by mouth daily.      . Biotin (BIOTIN 5000) 5 MG CAPS Take 1 capsule by mouth daily.      . brinzolamide (AZOPT) 1 % ophthalmic suspension Place 1 drop into the left eye 2 (two) times daily.      . Calcium Carbonate-Vitamin D (CALCIUM PLUS VITAMIN D PO) Take 1 tablet by mouth daily.      . Coenzyme Q10 (CO Q 10) 100 MG CAPS Take 1 tablet by mouth daily.      . diazepam (VALIUM) 5 MG tablet Take 2.5 mg by mouth at bedtime.       Marland Kitchen estradiol (ESTRACE) 0.1 MG/GM vaginal cream Place 2 g vaginally once a week.      . magnesium oxide (MAG-OX) 400 MG tablet Take 400 mg by mouth daily with breakfast.      . nitrofurantoin, macrocrystal-monohydrate, (MACROBID) 100 MG capsule Take 100 mg by mouth 2 (two) times daily as needed (bladder infection).      . Omega-3 Fatty Acids (FISH OIL) 1200 MG CAPS Take 1 capsule by  mouth daily.      Marland Kitchen omeprazole (PRILOSEC) 20 MG capsule Take 20 mg by mouth daily with breakfast.      . PRESCRIPTION MEDICATION Place 1-2 sprays into the nose. Lidocaine HCL Isotonic 4% Nasal spray (compounded spray)      . Probiotic Product (PROBIOTIC PO) Take 1 capsule by mouth daily.      Marland Kitchen zolmitriptan (ZOMIG) 5 MG tablet Take 5 mg by mouth daily as needed for migraine. May repeat after 2 hours if headache returns.      . Ascorbic Acid (VITAMIN C PO) Take 1 tablet by mouth daily.      . carbamazepine (TEGRETOL) 100 MG chewable tablet Chew 100-200 mg by mouth 2 (two) times daily.      . cholecalciferol (VITAMIN D) 1000 UNITS tablet Take 1,000 Units by mouth daily.      . ondansetron (ZOFRAN ODT)  8 MG disintegrating tablet 8mg  ODT q4 hours prn nausea  10 tablet  0  . oxyCODONE-acetaminophen (PERCOCET) 5-325 MG per tablet Take 1 tablet by mouth every 4 (four) hours as needed for pain.  20 tablet  0  . predniSONE (DELTASONE) 20 MG tablet 3 tabs po day one, then 2 po daily x 4 days  11 tablet  0   No facility-administered medications prior to visit.    PAST MEDICAL HISTORY: Past Medical History  Diagnosis Date  . Overactive bladder   . GERD (gastroesophageal reflux disease)   . Goiter     CAUSING COUGH, HOARSINESS AND DRY THROAT  . Headache     MIGRAINES - TAKES VERAPAMIL FOR PREVENTION OF MIGRAINES  . Arthritis     OA BOTH KNEES AND HANDS  . Hypertension   . Glaucoma     left eye    PAST SURGICAL HISTORY: Past Surgical History  Procedure Laterality Date  . Cervical fusion  2012  . Left shoulder surgery    . Incontinence surgery    . Right knee arthroscopy  11/2009    . Eye surgery      CATARACT EXTRACTION- BILATERAL  . Total knee arthroplasty  07/04/2011    Procedure: TOTAL KNEE ARTHROPLASTY;  Surgeon: Loanne Drilling, MD;  Location: [facility];  Service: Orthopedics;  Laterality: Right;    FAMILY HISTORY: No family history on file.  SOCIAL HISTORY:  History   Social History  . Marital Status: Married    Spouse Name: N/A    Number of Children: 2  . Years of Education: college   Occupational History  .     Social History Main Topics  . Smoking status: Never Smoker   . Smokeless tobacco: Not on file  . Alcohol Use: Yes     Comment: SELDOM  . Drug Use: No  . Sexually Active: Yes   Other Topics Concern  . Not on file   Social History Narrative  . No narrative on file     PHYSICAL EXAM  Filed Vitals:   06/30/12 0838  BP: 105/74  Pulse: 98  Temp: 98.3 F (36.8 C)  TempSrc: Oral  Height: 5\' 3"  (1.6 m)  Weight: 118 lb (53.524 kg)   Body mass index is 20.91 kg/(m^2).  GENERAL EXAM: Patient is in no distress, pleasant, well developed, well  groomed female.  CARDIOVASCULAR: Regular rate and rhythm, no murmurs, no carotid bruits; PROMINENT RIGHT TEMPORAL ARTERY WITH THROBBING PULSE NO TENDERNESS. LEFT TEMPORAL ARTERY DECREASED PULSE NO TENDERNESS.  NEUROLOGIC: MENTAL STATUS: awake, alert, language fluent, comprehension intact,  naming intact CRANIAL NERVE: no papilledema on fundoscopic exam, pupils RIGHT 2.5, LEFT 2, REACTIVE. Visual fields full to confrontation, extraocular muscles intact, no nystagmus, facial sensation and strength symmetric, uvula midline, shoulder shrug symmetric, tongue midline. MOTOR: normal bulk and tone, full strength in the BUE, BLE SENSORY: normal and symmetric to light touch, pinprick, temperature, vibration COORDINATION: finger-nose-finger, fine finger movements normal REFLEXES: deep tendon reflexes present and symmetric GAIT/STATION: narrow based gait; able to walk on toes, heels and tandem; romberg is negative   DIAGNOSTIC DATA (LABS, IMAGING, TESTING) - I reviewed patient records, labs, notes, testing and imaging myself where available.  Lab Results  Component Value Date   WBC 8.9 06/18/2012   HGB 11.6* 06/18/2012   HCT 33.9* 06/18/2012   MCV 88.7 06/18/2012   PLT 241 06/18/2012      Component Value Date/Time   NA 134* 06/18/2012 0859   K 3.8 06/18/2012 0859   CL 98 06/18/2012 0859   CO2 28 06/18/2012 0859   GLUCOSE 160* 06/18/2012 0859   BUN 17 06/18/2012 0859   CREATININE 0.76 06/18/2012 0859   CALCIUM 9.0 06/18/2012 0859   PROT 6.8 08/15/2011 1620   ALBUMIN 3.7 08/15/2011 1620   AST 14 08/15/2011 1620   ALT 9 08/15/2011 1620   ALKPHOS 73 08/15/2011 1620   BILITOT 0.3 08/15/2011 1620   GFRNONAA 80* 06/18/2012 0859   GFRAA >90 06/18/2012 0859   No results found for this basename: CHOL,  HDL,  LDLCALC,  LDLDIRECT,  TRIG,  CHOLHDL   No results found for this basename: HGBA1C   No results found for this basename: VITAMINB12   Lab Results  Component Value Date   TSH 0.527 08/10/2011   06/12/12 ESR - 53 (h)  06/30/12  MRI brain (w/wo) - mild chronic small vessel ischemic disease; mild atrophy; C1-C2 articulation fluid   ASSESSMENT AND PLAN  78 y.o. year old female  has a past medical history of Overactive bladder; GERD (gastroesophageal reflux disease); Goiter; Headache; Arthritis; Hypertension; and Glaucoma. here with right-sided temporal headache with jaw pain. ESR was 53.   DDx: Temporal Arteritis, Trigeminal Neuralgia, Migraine variant  PLAN: 1. Repeat ESR, CRP; if still elevated, then I will consider restarting prednisone and proceeding with temporal artery biopsy 2. Otherwise, will consider pursuing trigeminal neuralgia treatment  Suanne Marker, MD (with LYNN LAM NP-C 06/30/2012, 9:20 AM) Certified in Neurology, Neurophysiology and Neuroimaging  [facility] [address], [address] [place], [place] [address] [phone]

## 2012-07-01 LAB — C-REACTIVE PROTEIN: CRP: 129.7 mg/L — ABNORMAL HIGH (ref 0.0–4.9)

## 2012-07-01 LAB — SEDIMENTATION RATE: Sed Rate: 31 mm/hr (ref 0–40)

## 2012-07-02 ENCOUNTER — Encounter: Payer: Self-pay | Admitting: Internal Medicine

## 2012-07-02 ENCOUNTER — Telehealth: Payer: Self-pay | Admitting: Diagnostic Neuroimaging

## 2012-07-02 DIAGNOSIS — M316 Other giant cell arteritis: Secondary | ICD-10-CM

## 2012-07-02 MED ORDER — PREDNISONE 10 MG PO TABS: 60.0000 mg | ORAL_TABLET | Freq: Every day | ORAL | Status: DC

## 2012-07-02 NOTE — Telephone Encounter (Signed)
Pt requesting lab results.

## 2012-07-02 NOTE — Progress Notes (Signed)
Thanks for seeing her so expeditiously!

## 2012-07-02 NOTE — Telephone Encounter (Signed)
I called pt multiple times. No answer. She has high CRP, but normal ESR.   I will rx prednisone 60mg  daily and setup right temporal artery biopsy.  Please call patient.  Suanne Marker, MD 07/02/2012, 6:42 PM Certified in Neurology, Neurophysiology and Neuroimaging  [facility] [address] [place], [place] [address] [phone]

## 2012-07-03 NOTE — Telephone Encounter (Signed)
I spoke to patient. Elevated CRP raises possibility of temporal arteritis. I explained that I want to start her on prednisone 60 mg daily and proceed with right temporal artery biopsy. Patient agrees with plan.  I also asked patient to continue on her PPI, monitor her weight and blood pressure, and educated her on side effects of steroids.  Suanne Marker, MD 07/03/2012, 9:25 AM Certified in Neurology, Neurophysiology and Neuroimaging  [facility] [address], [address] [place], [place] [address] [phone]

## 2012-07-03 NOTE — Telephone Encounter (Signed)
I spoke to patient and gave lab results and follow up per Dr. Marjory Lies.  The patient would like to speak to doctor for a better understanding.  She gave her home number of [phone] and also a cell number [phone] where she can be reached.

## 2012-07-06 ENCOUNTER — Encounter: Payer: Self-pay | Admitting: Diagnostic Neuroimaging

## 2012-07-08 ENCOUNTER — Telehealth: Payer: Self-pay | Admitting: Diagnostic Neuroimaging

## 2012-07-08 NOTE — Telephone Encounter (Signed)
Please advise 

## 2012-07-10 NOTE — Telephone Encounter (Signed)
I called pt and she is scheduled with general surgery re: biopsy.

## 2012-07-11 ENCOUNTER — Ambulatory Visit (INDEPENDENT_AMBULATORY_CARE_PROVIDER_SITE_OTHER): Payer: Medicare Other | Admitting: Surgery

## 2012-07-11 ENCOUNTER — Ambulatory Visit (HOSPITAL_COMMUNITY)
Admission: RE | Admit: 2012-07-11 | Discharge: 2012-07-11 | Disposition: A | Discharge status: Home / Self Care | Payer: Medicare Other | Source: Ambulatory Visit | Attending: Surgery | Admitting: Surgery

## 2012-07-11 ENCOUNTER — Encounter (INDEPENDENT_AMBULATORY_CARE_PROVIDER_SITE_OTHER): Payer: Self-pay | Admitting: Surgery

## 2012-07-11 VITALS — BP 120/78 | HR 76 | Resp 14 | Ht 63.0 in | Wt 117.2 lb

## 2012-07-11 DIAGNOSIS — R51 Headache: Secondary | ICD-10-CM

## 2012-07-11 DIAGNOSIS — R519 Headache, unspecified: Secondary | ICD-10-CM

## 2012-07-11 DIAGNOSIS — R0989 Other specified symptoms and signs involving the circulatory and respiratory systems: Secondary | ICD-10-CM

## 2012-07-11 NOTE — Progress Notes (Signed)
NAME: Janet Wilson DOB: Oct 25, 1934 MRN: [id number]                                                                                      DATE: 07/11/2012  PCP: Pearla Dubonnet, MD Referring Provider: Suanne Marker, MD  IMPRESSION:  Right temporal headaches uncertain etiology  PLAN:   Discussed right temporal artery biopsy with her. Discussed that it is diagnostic, not theurapeutic and these are often normal. Since I do not feel a right temporal artery pulse, will get a ultrasound exam                 CC:  Chief Complaint  Patient presents with  . Headache    Right     HPI:  Janet Wilson is a 78 y.o.  female who presents for evaluation for a right temporal artery biopsy. Details of her history and labs are in Epic and have been reviewed. She has started prednisone  PMH:  has a past medical history of Overactive bladder; GERD (gastroesophageal reflux disease); Goiter; Headache(784.0); Arthritis; Hypertension; and Glaucoma.  PSH:   has past surgical history that includes Cervical fusion (2012); LEFT SHOULDER SURGERY; Incontinence surgery; RIGHT KNEE ARTHROSCOPY  11/2009; Eye surgery; and Total knee arthroplasty (07/04/2011).She had also had a facelift  ALLERGIES:   Allergies  Allergen Reactions  . Oxycodone Anaphylaxis  . Codeine Nausea Only    MEDICATIONS: Current outpatient prescriptions:aspirin EC 81 MG tablet, Take 81 mg by mouth daily., Disp: , Rfl: ;  Biotin (BIOTIN 5000) 5 MG CAPS, Take 1 capsule by mouth daily., Disp: , Rfl: ;  brinzolamide (AZOPT) 1 % ophthalmic suspension, Place 1 drop into the left eye 2 (two) times daily., Disp: , Rfl: ;  Calcium Carbonate-Vitamin D (CALCIUM PLUS VITAMIN D PO), Take 1 tablet by mouth daily., Disp: , Rfl:  carbamazepine (TEGRETOL) 100 MG chewable tablet, , Disp: , Rfl: ;  Coenzyme Q10 (CO Q 10) 100 MG CAPS, Take 1 tablet by mouth daily., Disp: , Rfl: ;  diazepam (VALIUM) 5 MG tablet, Take 2.5 mg by mouth at bedtime.  , Disp: , Rfl: ;  estradiol (ESTRACE) 0.1 MG/GM vaginal cream, Place 2 g vaginally once a week., Disp: , Rfl: ;  fluticasone (FLONASE) 50 MCG/ACT nasal spray, , Disp: , Rfl:  magnesium oxide (MAG-OX) 400 MG tablet, Take 400 mg by mouth daily with breakfast., Disp: , Rfl: ;  nitrofurantoin, macrocrystal-monohydrate, (MACROBID) 100 MG capsule, Take 100 mg by mouth 2 (two) times daily as needed (bladder infection)., Disp: , Rfl: ;  Omega-3 Fatty Acids (FISH OIL) 1200 MG CAPS, Take 1 capsule by mouth daily., Disp: , Rfl: ;  omeprazole (PRILOSEC) 20 MG capsule, Take 20 mg by mouth daily with breakfast., Disp: , Rfl:  predniSONE (DELTASONE) 10 MG tablet, Take 6 tablets (60 mg total) by mouth daily., Disp: 180 tablet, Rfl: 6;  PRESCRIPTION MEDICATION, Place 1-2 sprays into the nose. Lidocaine HCL Isotonic 4% Nasal spray (compounded spray), Disp: , Rfl: ;  Probiotic Product (PROBIOTIC PO), Take 1 capsule by mouth daily., Disp: , Rfl:  zolmitriptan (ZOMIG) 5 MG tablet, Take 5 mg by mouth  daily as needed for migraine. May repeat after 2 hours if headache returns., Disp: , Rfl:   ROS: She has filled out our 12 point review of systems and it is negative except as previously noted. EXAM:   Vitals: BP 120/78  Pulse 76  Resp 14  Ht 5\' 3"  (1.6 m)  Wt 117 lb 3.2 oz (53.162 kg)  BMI 20.77 kg/m2 General: alert, NAD Head. There are well healed surgical scars just anterior to the ear bilaterally from her prior facelift. They are directly over the temporal artery. I do not feel any pulse on the right TA, there is one on the left TA.  DATA REVIEWED:  Notes in Epic. She has elevated CRP and sed rate. Has started on Prednisone    Branson Kranz J 07/11/2012  CC: Penumalli, Glenford Bayley, MD, Pearla Dubonnet, MD

## 2012-07-11 NOTE — Patient Instructions (Signed)
We will obtain an ultrasound exam of your temporal artery and then make a decision about a biopsy

## 2012-07-11 NOTE — Progress Notes (Signed)
*  PRELIMINARY RESULTS* Vascular Ultrasound Right temporal artery ultrasound has been completed. The right temporal artery is patent with no obvious evidence of halo effect.  07/11/2012 11:31 AM Gertie Fey, RDMS, RDCS

## 2012-07-14 ENCOUNTER — Telehealth (INDEPENDENT_AMBULATORY_CARE_PROVIDER_SITE_OTHER): Payer: Self-pay | Admitting: General Surgery

## 2012-07-14 ENCOUNTER — Other Ambulatory Visit (INDEPENDENT_AMBULATORY_CARE_PROVIDER_SITE_OTHER): Payer: Self-pay | Admitting: Surgery

## 2012-07-14 NOTE — Telephone Encounter (Signed)
Pt called in to question date of bx scheduled.  She (with husband on speaker phone) stated the neurologist suggested a time frame on the prednisone to have the temporal artery biopsy performed.  The date of the bx is 07/21/12, and that date exceeds the time frame.  Paged and updated Dr. Jamey Ripa, who stated that date should be okay anyway, but he MIGHT be able to do it on 07/18/12 if OR time could be arranged.  Called pt and spoke with husband.  They are agreeable with the date it has already been set for.

## 2012-07-14 NOTE — Telephone Encounter (Signed)
I reviewed the ultrasound and spoke with the neurologist and he still would like a biopsy. I will put orders and and send to schedulers.Will do with some IV sedation since it may be more difficult than usual with her scar right there.

## 2012-07-14 NOTE — Telephone Encounter (Signed)
They do see a temporal artery that appears normal. I am getting up with her neuroolgist today, but will likelly go ahead and schedule her for temporal artery biopsy. As soon as I hear from neurologist we can get abck to her.

## 2012-07-14 NOTE — Telephone Encounter (Signed)
Patient looking for results of carotid doppler. I told her I would call her back once this has been reviewed by Dr Jamey Ripa. [phone].

## 2012-07-14 NOTE — Telephone Encounter (Signed)
Patient made aware of scan results and aware orders sent to scheduling and they will contact her to schedule surgery.

## 2012-07-16 NOTE — Pre-Procedure Instructions (Signed)
Janet Wilson  07/16/2012   Your procedure is scheduled on:  Monday, June 9th  Report to [facility] Stay Center at 0700 AM. Come to main entrance "A" and go to east elevators up to 3rd floor. Check in at short stay desk.  Call this number if you have problems the morning of surgery: [phone]   Remember:   Do not eat food or drink liquids after midnight.   Take these medicines the morning of surgery with A SIP OF WATER: prilosec, flonase, tegratol, prednisone, eye drops   Do not wear jewelry, make-up or nail polish.  Do not wear lotions, powders, or perfumes. You may wear deodorant.  Do not shave 48 hours prior to surgery. Men may shave face and neck.  Do not bring valuables to the hospital.  [facility] is not responsible    for any belongings or valuables.  Contacts, dentures or bridgework may not be worn into surgery.  Leave suitcase in the car. After surgery it may be brought to your room.  For patients admitted to the hospital, checkout time is 11:00 AM the day of  discharge.   Patients discharged the day of surgery will not be allowed to drive  home.    Special Instructions: Shower using CHG 2 nights before surgery and the night before surgery.  If you shower the day of surgery use CHG.  Use special wash - you have one bottle of CHG for all showers.  You should use approximately 1/3 of the bottle for each shower.   Please read over the following fact sheets that you were given: Pain Booklet, Coughing and Deep Breathing, MRSA Information and Surgical Site Infection Prevention

## 2012-07-17 ENCOUNTER — Encounter (HOSPITAL_COMMUNITY)
Admission: RE | Admit: 2012-07-17 | Discharge: 2012-07-17 | Disposition: A | Discharge status: Home / Self Care | Payer: Medicare Other | Source: Ambulatory Visit | Attending: Surgery | Admitting: Surgery

## 2012-07-17 ENCOUNTER — Encounter (HOSPITAL_COMMUNITY): Payer: Self-pay

## 2012-07-17 LAB — BASIC METABOLIC PANEL
BUN: 21 mg/dL (ref 6–23)
CO2: 28 mEq/L (ref 19–32)
Calcium: 8.9 mg/dL (ref 8.4–10.5)
Chloride: 103 mEq/L (ref 96–112)
Creatinine, Ser: 0.83 mg/dL (ref 0.50–1.10)
GFR calc Af Amer: 77 mL/min — ABNORMAL LOW (ref >90)
GFR calc non Af Amer: 67 mL/min — ABNORMAL LOW (ref >90)
Glucose, Bld: 82 mg/dL (ref 70–99)
Potassium: 3.8 mEq/L (ref 3.5–5.1)
Sodium: 140 mEq/L (ref 135–145)

## 2012-07-17 LAB — CBC
HCT: 38.2 % (ref 36.0–46.0)
Hemoglobin: 12.2 g/dL (ref 12.0–15.0)
MCH: 29.8 pg (ref 26.0–34.0)
MCHC: 31.9 g/dL (ref 30.0–36.0)
MCV: 93.2 fL (ref 78.0–100.0)
Platelets: 255 10*3/uL (ref 150–400)
RBC: 4.1 MIL/uL (ref 3.87–5.11)
RDW: 15.5 % (ref 11.5–15.5)
WBC: 15.2 10*3/uL — ABNORMAL HIGH (ref 4.0–10.5)

## 2012-07-17 LAB — SURGICAL PCR SCREEN
MRSA, PCR: NEGATIVE
Staphylococcus aureus: NEGATIVE

## 2012-07-17 NOTE — Progress Notes (Signed)
Revonda Standard to review pt labs; WBC 15.2

## 2012-07-18 NOTE — Progress Notes (Signed)
Anesthesia Chart Review:  Patient is a 78 year old femoral scheduled for temporal artery biopsy on 07/21/12 by Dr. Jamey Ripa.  History includes non-smoker, HTN, glaucoma, goiter, arthritis, GERD, migraine headaches, overactive bladder, cervical fusion, right TKA 06/2011.  PCP is Dr. Marden Noble.  She was seen by cardiologist Dr. Donato Schultz in June 2013 for rate related left BBB and fatigue and had a normal stress test and mild pulmonary HTN, mild AR/MR by echo.  EKG on 06/18/12 showed NSR, low voltage QRS, cannot rule out anterior infarct (age undetermined).  Nuclear stress test on 08/12/11 showed: 1. No evidence for pharmacologically induced myocardial ischemia.  The summed difference score is equal to zero.  2. Left ventricular ejection fraction equals 78%.  3. Normal left ventricular systolic function.   Echo on 08/11/11 showed: - Left ventricle: The cavity size was normal. The estimated ejection fraction was 55%. Wall motion was normal; there were no regional wall motion abnormalities. There was an increased relative contribution of atrial contraction to ventricular filling. Doppler parameters are consistent with abnormal left ventricular relaxation (grade 1 diastolic dysfunction). - Aortic valve: Moderate diffuse thickening and calcification, consistent with sclerosis. Mild regurgitation. - Mitral valve: Mild regurgitation. - Atrial septum: No defect or patent foramen ovale was identified. - Pulmonary arteries: PA peak pressure: 39mm Hg (S). Impressions: The right ventricular systolic pressure was increased consistent with mild pulmonary hypertension.  CXR on 06/18/12 showed no active disease.  Preoperative labs noted.  WBC is 15.2.  She is now on prednisone for until temporal arteritis is ruled out.  Steroids are likely contributing to leukocytosis.  Epic indicates labs have been reviewed by Dr. Luana Shu defer additional orders, if any, to Dr. Jamey Ripa.    She will be evaluated by her assigned  anesthesiologist on the day of surgery, but would anticipate that she could proceed as planned.  Velna Ochs [facility] Short Stay Center/Anesthesiology Phone [phone] 07/18/2012 10:16 AM

## 2012-07-21 ENCOUNTER — Ambulatory Visit (HOSPITAL_COMMUNITY)
Admission: RE | Admit: 2012-07-21 | Discharge: 2012-07-21 | Disposition: A | Discharge status: Home / Self Care | Payer: Medicare Other | Source: Ambulatory Visit | Attending: Surgery | Admitting: Surgery

## 2012-07-21 ENCOUNTER — Encounter (HOSPITAL_COMMUNITY)
Admission: RE | Disposition: A | Discharge status: Home / Self Care | Payer: Self-pay | Source: Ambulatory Visit | Attending: Surgery

## 2012-07-21 ENCOUNTER — Encounter (HOSPITAL_COMMUNITY): Payer: Self-pay | Admitting: Vascular Surgery

## 2012-07-21 ENCOUNTER — Ambulatory Visit (HOSPITAL_COMMUNITY): Payer: Medicare Other | Admitting: Anesthesiology

## 2012-07-21 ENCOUNTER — Encounter (HOSPITAL_COMMUNITY): Payer: Self-pay | Admitting: *Deleted

## 2012-07-21 DIAGNOSIS — Z885 Allergy status to narcotic agent status: Secondary | ICD-10-CM | POA: Insufficient documentation

## 2012-07-21 DIAGNOSIS — K219 Gastro-esophageal reflux disease without esophagitis: Secondary | ICD-10-CM | POA: Insufficient documentation

## 2012-07-21 DIAGNOSIS — I10 Essential (primary) hypertension: Secondary | ICD-10-CM | POA: Insufficient documentation

## 2012-07-21 DIAGNOSIS — E049 Nontoxic goiter, unspecified: Secondary | ICD-10-CM | POA: Insufficient documentation

## 2012-07-21 DIAGNOSIS — R519 Headache, unspecified: Secondary | ICD-10-CM

## 2012-07-21 DIAGNOSIS — H409 Unspecified glaucoma: Secondary | ICD-10-CM | POA: Insufficient documentation

## 2012-07-21 DIAGNOSIS — M129 Arthropathy, unspecified: Secondary | ICD-10-CM | POA: Insufficient documentation

## 2012-07-21 DIAGNOSIS — R51 Headache: Secondary | ICD-10-CM

## 2012-07-21 DIAGNOSIS — Z7982 Long term (current) use of aspirin: Secondary | ICD-10-CM | POA: Insufficient documentation

## 2012-07-21 DIAGNOSIS — M316 Other giant cell arteritis: Secondary | ICD-10-CM | POA: Insufficient documentation

## 2012-07-21 DIAGNOSIS — N318 Other neuromuscular dysfunction of bladder: Secondary | ICD-10-CM | POA: Insufficient documentation

## 2012-07-21 DIAGNOSIS — Z79899 Other long term (current) drug therapy: Secondary | ICD-10-CM | POA: Insufficient documentation

## 2012-07-21 HISTORY — PX: ARTERY BIOPSY: SHX891

## 2012-07-21 SURGERY — BIOPSY TEMPORAL ARTERY
Anesthesia: Monitor Anesthesia Care | Site: Head | Laterality: Right | Wound class: Clean

## 2012-07-21 MED ORDER — BUPIVACAINE HCL (PF) 0.5 % IJ SOLN
INTRAMUSCULAR | Status: AC
  Filled 2012-07-21: qty 30

## 2012-07-21 MED ORDER — PROPOFOL INFUSION 10 MG/ML OPTIME
INTRAVENOUS | Status: DC | PRN
  Administered 2012-07-21: 75 ug/kg/min via INTRAVENOUS

## 2012-07-21 MED ORDER — FENTANYL CITRATE 0.05 MG/ML IJ SOLN
INTRAMUSCULAR | Status: DC | PRN
  Administered 2012-07-21: 50 ug via INTRAVENOUS

## 2012-07-21 MED ORDER — CHLORHEXIDINE GLUCONATE 4 % EX LIQD: 1.0000 "application " | Freq: Once | CUTANEOUS | Status: DC

## 2012-07-21 MED ORDER — LIDOCAINE HCL (PF) 1 % IJ SOLN
INTRAMUSCULAR | Status: AC
  Filled 2012-07-21: qty 30

## 2012-07-21 MED ORDER — LIDOCAINE HCL (CARDIAC) 20 MG/ML IV SOLN
INTRAVENOUS | Status: DC | PRN
  Administered 2012-07-21: 40 mg via INTRAVENOUS

## 2012-07-21 MED ORDER — LACTATED RINGERS IV SOLN
INTRAVENOUS | Status: DC
  Administered 2012-07-21: 09:00:00 via INTRAVENOUS

## 2012-07-21 MED ORDER — LACTATED RINGERS IV SOLN
INTRAVENOUS | Status: DC | PRN
  Administered 2012-07-21: 09:00:00 via INTRAVENOUS

## 2012-07-21 MED ORDER — MIDAZOLAM HCL 5 MG/5ML IJ SOLN
INTRAMUSCULAR | Status: DC | PRN
  Administered 2012-07-21: 2 mg via INTRAVENOUS

## 2012-07-21 MED ORDER — LIDOCAINE HCL 1 % IJ SOLN
INTRAMUSCULAR | Status: DC | PRN
  Administered 2012-07-21: 09:00:00

## 2012-07-21 MED ORDER — 0.9 % SODIUM CHLORIDE (POUR BTL) OPTIME
TOPICAL | Status: DC | PRN
  Administered 2012-07-21: 1000 mL

## 2012-07-21 SURGICAL SUPPLY — 56 items
ADH SKN CLS APL DERMABOND .7 (GAUZE/BANDAGES/DRESSINGS) ×1
ADH SKN CLS LQ APL DERMABOND (GAUZE/BANDAGES/DRESSINGS) ×1
BALL CTTN LRG ABS STRL LF (GAUZE/BANDAGES/DRESSINGS) ×1
BLADE SURG 15 STRL LF DISP TIS (BLADE) ×1 IMPLANT
BLADE SURG 15 STRL SS (BLADE) ×2
BLADE SURG ROTATE 9660 (MISCELLANEOUS) ×1 IMPLANT
CHLORAPREP W/TINT 10.5 ML (MISCELLANEOUS) ×2 IMPLANT
CLOTH BEACON ORANGE TIMEOUT ST (SAFETY) ×2 IMPLANT
COTTONBALL LRG STERILE PKG (GAUZE/BANDAGES/DRESSINGS) ×2 IMPLANT
COVER SURGICAL LIGHT HANDLE (MISCELLANEOUS) ×2 IMPLANT
CRADLE DONUT ADULT HEAD (MISCELLANEOUS) ×2 IMPLANT
DERMABOND ADHESIVE PROPEN (GAUZE/BANDAGES/DRESSINGS) ×1
DERMABOND ADVANCED (GAUZE/BANDAGES/DRESSINGS) ×1
DERMABOND ADVANCED .7 DNX12 (GAUZE/BANDAGES/DRESSINGS) ×1 IMPLANT
DERMABOND ADVANCED .7 DNX6 (GAUZE/BANDAGES/DRESSINGS) IMPLANT
DRAPE PED LAPAROTOMY (DRAPES) ×2 IMPLANT
DRAPE UTILITY 15X26 W/TAPE STR (DRAPE) ×4 IMPLANT
DRESSING TELFA 8X3 (GAUZE/BANDAGES/DRESSINGS) ×3 IMPLANT
ELECT CAUTERY BLADE 6.4 (BLADE) ×2 IMPLANT
ELECT NDL TIP 2.8 STRL (NEEDLE) ×1 IMPLANT
ELECT NEEDLE TIP 2.8 STRL (NEEDLE) ×2 IMPLANT
ELECT REM PT RETURN 9FT ADLT (ELECTROSURGICAL) ×2
ELECTRODE REM PT RTRN 9FT ADLT (ELECTROSURGICAL) ×1 IMPLANT
GAUZE SPONGE 4X4 16PLY XRAY LF (GAUZE/BANDAGES/DRESSINGS) ×2 IMPLANT
GLOVE BIO SURGEON STRL SZ7.5 (GLOVE) ×2 IMPLANT
GLOVE BIOGEL PI IND STRL 7.5 (GLOVE) IMPLANT
GLOVE BIOGEL PI IND STRL 8 (GLOVE) ×1 IMPLANT
GLOVE BIOGEL PI INDICATOR 7.5 (GLOVE) ×1
GLOVE BIOGEL PI INDICATOR 8 (GLOVE) ×1
GLOVE ECLIPSE 7.5 STRL STRAW (GLOVE) ×2 IMPLANT
GOWN PREVENTION PLUS XLARGE (GOWN DISPOSABLE) ×1 IMPLANT
GOWN STRL NON-REIN LRG LVL3 (GOWN DISPOSABLE) ×3 IMPLANT
KIT BASIN OR (CUSTOM PROCEDURE TRAY) ×2 IMPLANT
KIT ROOM TURNOVER OR (KITS) ×2 IMPLANT
NDL HYPO 30X.5 LL (NEEDLE) IMPLANT
NEEDLE HYPO 30X.5 LL (NEEDLE) ×2 IMPLANT
NS IRRIG 1000ML POUR BTL (IV SOLUTION) ×2 IMPLANT
PACK SURGICAL SETUP 50X90 (CUSTOM PROCEDURE TRAY) ×2 IMPLANT
PAD ARMBOARD 7.5X6 YLW CONV (MISCELLANEOUS) ×4 IMPLANT
PENCIL BUTTON HOLSTER BLD 10FT (ELECTRODE) ×2 IMPLANT
SPECIMEN JAR SMALL (MISCELLANEOUS) ×2 IMPLANT
SPONGE INTESTINAL PEANUT (DISPOSABLE) IMPLANT
STRIP CLOSURE SKIN 1/4X4 (GAUZE/BANDAGES/DRESSINGS) ×2 IMPLANT
SUT MON AB 4-0 PC3 18 (SUTURE) ×1 IMPLANT
SUT VIC AB 3-0 54X BRD REEL (SUTURE) ×1 IMPLANT
SUT VIC AB 3-0 BRD 54 (SUTURE) ×2
SUT VIC AB 4-0 P-3 18X BRD (SUTURE) IMPLANT
SUT VIC AB 4-0 P3 18 (SUTURE) ×2
SUT VIC AB 4-0 RB1 27 (SUTURE)
SUT VIC AB 4-0 RB1 27X BRD (SUTURE) IMPLANT
SUT VICRYL AB 4 0 18 (SUTURE) IMPLANT
SYR BULB 3OZ (MISCELLANEOUS) ×2 IMPLANT
SYR CONTROL 10ML LL (SYRINGE) IMPLANT
TOWEL OR 17X24 6PK STRL BLUE (TOWEL DISPOSABLE) ×2 IMPLANT
TOWEL OR 17X26 10 PK STRL BLUE (TOWEL DISPOSABLE) ×2 IMPLANT
WATER STERILE IRR 1000ML POUR (IV SOLUTION) IMPLANT

## 2012-07-21 NOTE — Op Note (Signed)
Janet Wilson 05-17-1934 [id number] 07/14/2012  Preoperative diagnosis: Headaches, possible temporal arteritis  Postoperative diagnosis: Same  Procedure: Right temporal artery biopsy  Surgeon: Currie Paris, MD, FACS  Anesthesia: MAC   Clinical History and Indications: This patient has been evaluated by neurology and findings suggestive of temporal arteritis and right TA Bx requested    Description of Procedure: Patient was seen in the pre-op area and plans confirmed and the area of the right temporal artery marked. She was taken to the OR and after adequate IV sedation the area was prepped and draped and a time out done.  A mixture of 1% xylo and 0.5% marcaine was used for local. The area over the right TA was anesthetised and an incision made over the path of the TA. The sub q was divided and the TA exposed. I was able to free up a 2.5 cm segment. The end were ligated with 4-0 Vicryl and the segment was excised and sent to path. The incision was closed in layers with 4-0Vicryl, 4-0 Monocryl and dermabond.  The patient tolerated the procedure well. There were no complications, blood loss was minimal.  Currie Paris, MD, FACS 07/21/2012 9:40 AM

## 2012-07-21 NOTE — Preoperative (Signed)
Beta Blockers   Reason not to administer Beta Blockers:Not Applicable 

## 2012-07-21 NOTE — Anesthesia Postprocedure Evaluation (Signed)
  Anesthesia Post-op Note  Patient: Janet Wilson  Procedure(s) Performed: Procedure(s): BIOPSY TEMPORAL ARTERY (Right)  Patient Location: PACU  Anesthesia Type:MAC  Level of Consciousness: awake, alert , oriented and patient cooperative  Airway and Oxygen Therapy: Patient Spontanous Breathing  Post-op Pain: none  Post-op Assessment: Post-op Vital signs reviewed, Patient's Cardiovascular Status Stable, Respiratory Function Stable, Patent Airway, No signs of Nausea or vomiting and Pain level controlled  Post-op Vital Signs: Reviewed and stable  Complications: No apparent anesthesia complications

## 2012-07-21 NOTE — Interval H&P Note (Signed)
History and Physical Interval Note:  07/21/2012 8:23 AM  Janet Wilson  has presented today for surgery, with the diagnosis of right headaches  The various methods of treatment have been discussed with the patient and family. After consideration of risks, benefits and other options for treatment, the patient has consented to  Procedure(s): BIOPSY TEMPORAL ARTERY (Right) as a surgical intervention .  The patient's history has been reviewed, patient examined, no change in status, stable for surgery.  I have reviewed the patient's chart and labs.  Questions were answered to the patient's satisfaction.    Additional exam today Lungs: Clear with normal respirations Heart: Reg, no M,R,G Abd: soft and benign.  Doppler exam done after office visit showed flow in right TA.  Right TA area marked as the surgical site   Ryman Rathgeber J

## 2012-07-21 NOTE — Transfer of Care (Signed)
Immediate Anesthesia Transfer of Care Note  Patient: Janet Wilson  Procedure(s) Performed: Procedure(s): BIOPSY TEMPORAL ARTERY (Right)  Patient Location: PACU  Anesthesia Type:MAC  Level of Consciousness: awake, alert  and oriented  Airway & Oxygen Therapy: Patient Spontanous Breathing  Post-op Assessment: Report given to PACU RN and Post -op Vital signs reviewed and stable  Post vital signs: Reviewed and stable  Complications: No apparent anesthesia complications

## 2012-07-21 NOTE — Anesthesia Preprocedure Evaluation (Addendum)
Anesthesia Evaluation  Patient identified by MRN, date of birth, ID band Patient awake    Reviewed: Allergy & Precautions, H&P , NPO status , Patient's Chart, lab work & pertinent test results  Airway Mallampati: I TM Distance: >3 FB Neck ROM: full    Dental  (+) Teeth Intact and Dental Advisory Given   Pulmonary  breath sounds clear to auscultation        Cardiovascular hypertension, Rhythm:regular Rate:Normal     Neuro/Psych  Headaches,    GI/Hepatic GERD-  ,  Endo/Other    Renal/GU      Musculoskeletal   Abdominal (+)  Abdomen: soft. Bowel sounds: normal.  Peds  Hematology   Anesthesia Other Findings   Reproductive/Obstetrics                          Anesthesia Physical Anesthesia Plan  ASA: II  Anesthesia Plan: General   Post-op Pain Management:    Induction: Intravenous  Airway Management Planned: Oral ETT and LMA  Additional Equipment:   Intra-op Plan:   Post-operative Plan: Extubation in OR  Informed Consent: I have reviewed the patients History and Physical, chart, labs and discussed the procedure including the risks, benefits and alternatives for the proposed anesthesia with the patient or authorized representative who has indicated his/her understanding and acceptance.     Plan Discussed with: CRNA, Anesthesiologist and Surgeon  Anesthesia Plan Comments:         Anesthesia Quick Evaluation

## 2012-07-21 NOTE — H&P (View-Only) (Signed)
NAME: Janet Wilson DOB: 08/11/1934 MRN: [id number]                                                                                      DATE: 07/11/2012  PCP: GATES,ROBERT NEVILL, MD Referring Provider: Penumalli, Vikram R, MD  IMPRESSION:  Right temporal headaches uncertain etiology  PLAN:   Discussed right temporal artery biopsy with her. Discussed that it is diagnostic, not theurapeutic and these are often normal. Since I do not feel a right temporal artery pulse, will get a ultrasound exam                 CC:  Chief Complaint  Patient presents with  . Headache    Right     HPI:  Janet Wilson is a 78 y.o.  female who presents for evaluation for a right temporal artery biopsy. Details of her history and labs are in Epic and have been reviewed. She has started prednisone  PMH:  has a past medical history of Overactive bladder; GERD (gastroesophageal reflux disease); Goiter; Headache(784.0); Arthritis; Hypertension; and Glaucoma.  PSH:   has past surgical history that includes Cervical fusion (2012); LEFT SHOULDER SURGERY; Incontinence surgery; RIGHT KNEE ARTHROSCOPY  11/2009; Eye surgery; and Total knee arthroplasty (07/04/2011).She had also had a facelift  ALLERGIES:   Allergies  Allergen Reactions  . Oxycodone Anaphylaxis  . Codeine Nausea Only    MEDICATIONS: Current outpatient prescriptions:aspirin EC 81 MG tablet, Take 81 mg by mouth daily., Disp: , Rfl: ;  Biotin (BIOTIN 5000) 5 MG CAPS, Take 1 capsule by mouth daily., Disp: , Rfl: ;  brinzolamide (AZOPT) 1 % ophthalmic suspension, Place 1 drop into the left eye 2 (two) times daily., Disp: , Rfl: ;  Calcium Carbonate-Vitamin D (CALCIUM PLUS VITAMIN D PO), Take 1 tablet by mouth daily., Disp: , Rfl:  carbamazepine (TEGRETOL) 100 MG chewable tablet, , Disp: , Rfl: ;  Coenzyme Q10 (CO Q 10) 100 MG CAPS, Take 1 tablet by mouth daily., Disp: , Rfl: ;  diazepam (VALIUM) 5 MG tablet, Take 2.5 mg by mouth at bedtime.  , Disp: , Rfl: ;  estradiol (ESTRACE) 0.1 MG/GM vaginal cream, Place 2 g vaginally once a week., Disp: , Rfl: ;  fluticasone (FLONASE) 50 MCG/ACT nasal spray, , Disp: , Rfl:  magnesium oxide (MAG-OX) 400 MG tablet, Take 400 mg by mouth daily with breakfast., Disp: , Rfl: ;  nitrofurantoin, macrocrystal-monohydrate, (MACROBID) 100 MG capsule, Take 100 mg by mouth 2 (two) times daily as needed (bladder infection)., Disp: , Rfl: ;  Omega-3 Fatty Acids (FISH OIL) 1200 MG CAPS, Take 1 capsule by mouth daily., Disp: , Rfl: ;  omeprazole (PRILOSEC) 20 MG capsule, Take 20 mg by mouth daily with breakfast., Disp: , Rfl:  predniSONE (DELTASONE) 10 MG tablet, Take 6 tablets (60 mg total) by mouth daily., Disp: 180 tablet, Rfl: 6;  PRESCRIPTION MEDICATION, Place 1-2 sprays into the nose. Lidocaine HCL Isotonic 4% Nasal spray (compounded spray), Disp: , Rfl: ;  Probiotic Product (PROBIOTIC PO), Take 1 capsule by mouth daily., Disp: , Rfl:  zolmitriptan (ZOMIG) 5 MG tablet, Take 5 mg by mouth   daily as needed for migraine. May repeat after 2 hours if headache returns., Disp: , Rfl:   ROS: She has filled out our 12 point review of systems and it is negative except as previously noted. EXAM:   Vitals: BP 120/78  Pulse 76  Resp 14  Ht 5' 3" (1.6 m)  Wt 117 lb 3.2 oz (53.162 kg)  BMI 20.77 kg/m2 General: alert, NAD Head. There are well healed surgical scars just anterior to the ear bilaterally from her prior facelift. They are directly over the temporal artery. I do not feel any pulse on the right TA, there is one on the left TA.  DATA REVIEWED:  Notes in Epic. She has elevated CRP and sed rate. Has started on Prednisone    Raunel Dimartino J 07/11/2012  CC: Penumalli, Vikram R, MD, GATES,ROBERT NEVILL, MD        

## 2012-07-22 ENCOUNTER — Encounter (HOSPITAL_COMMUNITY): Payer: Self-pay | Admitting: Surgery

## 2012-07-23 ENCOUNTER — Encounter: Payer: Self-pay | Admitting: Diagnostic Neuroimaging

## 2012-07-23 ENCOUNTER — Telehealth: Payer: Self-pay | Admitting: *Deleted

## 2012-07-23 ENCOUNTER — Telehealth (INDEPENDENT_AMBULATORY_CARE_PROVIDER_SITE_OTHER): Payer: Self-pay | Admitting: General Surgery

## 2012-07-23 NOTE — Telephone Encounter (Signed)
Patient called back at this time and is agreeable with appt.  Patient also informed surg path showed benign.

## 2012-07-23 NOTE — Telephone Encounter (Signed)
LMOM making patient aware of po appt scheduled 08/14/2012 at 1:50 pm. TO call back if this is not a good date/time.

## 2012-07-23 NOTE — Telephone Encounter (Signed)
Sent to Dr. Penumalli 

## 2012-07-25 ENCOUNTER — Telehealth (INDEPENDENT_AMBULATORY_CARE_PROVIDER_SITE_OTHER): Payer: Self-pay

## 2012-07-25 ENCOUNTER — Encounter (INDEPENDENT_AMBULATORY_CARE_PROVIDER_SITE_OTHER): Payer: Self-pay | Admitting: Surgery

## 2012-07-25 NOTE — Telephone Encounter (Signed)
LMOM letting pt know that her results are in and for her to call our office for them - It appears from the biopsy that she does have temporal arteritis

## 2012-07-29 ENCOUNTER — Ambulatory Visit (INDEPENDENT_AMBULATORY_CARE_PROVIDER_SITE_OTHER): Payer: Medicare Other | Admitting: Diagnostic Neuroimaging

## 2012-07-29 ENCOUNTER — Encounter: Payer: Self-pay | Admitting: Diagnostic Neuroimaging

## 2012-07-29 VITALS — BP 128/76 | HR 99 | Temp 97.4°F

## 2012-07-29 DIAGNOSIS — M316 Other giant cell arteritis: Secondary | ICD-10-CM

## 2012-07-29 MED ORDER — PREDNISONE 10 MG PO TABS: 50.0000 mg | ORAL_TABLET | Freq: Every day | ORAL | Status: DC

## 2012-07-29 NOTE — Patient Instructions (Signed)
I will check lab testing. Reduce prednisone to 50mg  daily.

## 2012-07-29 NOTE — Progress Notes (Signed)
[facility]  PATIENT: Janet Wilson DOB: 02/06/1935  REFERRING CLINICIAN: Marden Noble HISTORY FROM: patient and husband REASON FOR VISIT: right-sided headaches with jaw pain   HISTORICAL  CHIEF COMPLAINT:  Chief Complaint  Patient presents with  . Follow-up    after temporal arteritis biopsy    HISTORY OF PRESENT ILLNESS:   UPDATE 07/29/12: Since last visit, has done well with prednisone. HA and jaw pain are essentially resolved. Mild dull HA in early morning. Tolerated biopsy, which confirms temporal arteritis.  PRIOR HPI (06/30/12): Janet Wilson is a 78 year old white lady with c/o worsening right temporal headaches that started 5 weeks ago.   Since that time she has been experiencing extreme fatigue and steady weight loss.  She describes right sided headache, with sharp pain and pain with chewing.  She is also experiencing throat/neck pain intermittently. She has seen her dentist to r/o TMJ, dentist did not think pain was coming from TMJ.  Went to the PCP on 06/12/12, with headache and rx carbamazepine, but stopped due to dizziness and lack of effect. Went to ER on 06/18/12 with HA and chest heaviness.  Spouse states that the veins over her right forehead were enlarged and pulsating. Cardiac workup negative.  MRI of brain was unremarkable. Was d/c'd from hospital with a 4 day course of prednisone which did not relieve the pain.    Since the beginning 5 weeks ago, she feels like the pain was the worst at the beginning, then tapered off some but had intermittent sharp jabbing pains daily with residual soreness, and today in office feels slightly better.  Migraines since a teenager, went to headache clinic here in [place] years ago.  Typical migraine was right-sided throbbing over the right eye. She had an aura of a "different feeling" but no specific vision changes. Photophobia and phonophobia typical, seldom nausea.  Was controlled with Zomig.  She states  she had migraines a few times a year, last one being about a year ago.    She states she lost her sense of smell about 20 years ago, preceded by smelling things that were not really there, such as garlic.  She has glaucoma in the left eye, and cataracts removed in both eyes.  REVIEW OF SYSTEMS: Full 14 system review of systems performed and notable only for fatigue, headache.  ALLERGIES: Allergies  Allergen Reactions  . Oxycodone Anaphylaxis  . Codeine Nausea Only    HOME MEDICATIONS: Outpatient Prescriptions Prior to Visit  Medication Sig Dispense Refill  . aspirin EC 81 MG tablet Take 81 mg by mouth daily.      . Biotin (BIOTIN 5000) 5 MG CAPS Take 1 capsule by mouth daily.      . brinzolamide (AZOPT) 1 % ophthalmic suspension Place 1 drop into the left eye 2 (two) times daily.      . Calcium Carbonate-Vitamin D (CALCIUM PLUS VITAMIN D PO) Take 1 tablet by mouth daily.      . Coenzyme Q10 (CO Q 10) 100 MG CAPS Take 1 tablet by mouth daily.      . diazepam (VALIUM) 5 MG tablet Take 2.5 mg by mouth at bedtime.       Marland Kitchen estradiol (ESTRACE) 0.1 MG/GM vaginal cream Place 2 g vaginally once a week.      . magnesium oxide (MAG-OX) 400 MG tablet Take 400 mg by mouth daily with breakfast.      . nitrofurantoin, macrocrystal-monohydrate, (MACROBID) 100 MG capsule Take 100  mg by mouth 2 (two) times daily as needed (bladder infection).      . Omega-3 Fatty Acids (FISH OIL) 1200 MG CAPS Take 1 capsule by mouth daily.      Marland Kitchen omeprazole (PRILOSEC) 20 MG capsule Take 20 mg by mouth daily with breakfast.      . Probiotic Product (PROBIOTIC PO) Take 1 capsule by mouth daily.      Marland Kitchen zolmitriptan (ZOMIG) 5 MG tablet Take 5 mg by mouth daily as needed for migraine. May repeat after 2 hours if headache returns.      . predniSONE (DELTASONE) 10 MG tablet Take 6 tablets (60 mg total) by mouth daily.  180 tablet  6   No facility-administered medications prior to visit.    PAST MEDICAL HISTORY: Past  Medical History  Diagnosis Date  . Overactive bladder   . GERD (gastroesophageal reflux disease)   . Goiter     CAUSING COUGH, HOARSINESS AND DRY THROAT  . Headache(784.0)     MIGRAINES - TAKES VERAPAMIL FOR PREVENTION OF MIGRAINES  . Arthritis     OA BOTH KNEES AND HANDS  . Glaucoma     left eye  . Hypertension     not on any BP meications    PAST SURGICAL HISTORY: Past Surgical History  Procedure Laterality Date  . Cervical fusion  2012  . Left shoulder surgery    . Incontinence surgery    . Right knee arthroscopy  11/2009    . Eye surgery      CATARACT EXTRACTION- BILATERAL  . Total knee arthroplasty  07/04/2011    Procedure: TOTAL KNEE ARTHROPLASTY;  Surgeon: Loanne Drilling, MD;  Location: [facility];  Service: Orthopedics;  Laterality: Right;  . Joint replacement      right knee  . Artery biopsy Right 07/21/2012    Procedure: BIOPSY TEMPORAL ARTERY;  Surgeon: Currie Paris, MD;  Location: [facility] OR;  Service: General;  Laterality: Right;    FAMILY HISTORY: Family History  Problem Relation Age of Onset  . Alzheimer's disease Mother   . Heart attack Father     SOCIAL HISTORY:  History   Social History  . Marital Status: Married    Spouse Name: Greggory Stallion    Number of Children: 2  . Years of Education: college   Occupational History  .     Social History Main Topics  . Smoking status: Never Smoker   . Smokeless tobacco: Never Used  . Alcohol Use: Yes     Comment: SELDOM  . Drug Use: No  . Sexually Active: Yes   Other Topics Concern  . Not on file   Social History Narrative   Pt lives at home with family.   Caffeine Use: Very little.      PHYSICAL EXAM  Filed Vitals:   07/29/12 1516  BP: 128/76  Pulse: 99  Temp: 97.4 F (36.3 C)  TempSrc: Oral   There is no weight on file to calculate BMI.  GENERAL EXAM: Patient is in no distress, pleasant, well developed, well groomed female.  CARDIOVASCULAR: Regular rate and rhythm, no murmurs, no  carotid bruits; RIGHT PREAURICULAR INCISION SITE, CDI. NO RIGHT TEMP ART PULSE PALPABLE. LEFT TEMPORAL ARTERY DECREASED PULSE NO TENDERNESS.  NEUROLOGIC: MENTAL STATUS: awake, alert, language fluent, comprehension intact, naming intact CRANIAL NERVE: no papilledema on fundoscopic exam, pupils REACTIVE. Visual fields full to confrontation, extraocular muscles intact, no nystagmus, facial sensation and strength symmetric, uvula midline, shoulder shrug symmetric,  tongue midline. MOTOR: normal bulk and tone, full strength in the BUE, BLE SENSORY: normal and symmetric to light touch, pinprick, temperature, vibration COORDINATION: finger-nose-finger, fine finger movements normal REFLEXES: deep tendon reflexes present and symmetric GAIT/STATION: narrow based gait; able to walk on toes, heels and tandem; romberg is negative   DIAGNOSTIC DATA (LABS, IMAGING, TESTING) - I reviewed patient records, labs, notes, testing and imaging myself where available.  Lab Results  Component Value Date   WBC 15.2* 07/17/2012   HGB 12.2 07/17/2012   HCT 38.2 07/17/2012   MCV 93.2 07/17/2012   PLT 255 07/17/2012      Component Value Date/Time   NA 140 07/17/2012 1040   K 3.8 07/17/2012 1040   CL 103 07/17/2012 1040   CO2 28 07/17/2012 1040   GLUCOSE 82 07/17/2012 1040   BUN 21 07/17/2012 1040   CREATININE 0.83 07/17/2012 1040   CALCIUM 8.9 07/17/2012 1040   PROT 6.8 08/15/2011 1620   ALBUMIN 3.7 08/15/2011 1620   AST 14 08/15/2011 1620   ALT 9 08/15/2011 1620   ALKPHOS 73 08/15/2011 1620   BILITOT 0.3 08/15/2011 1620   GFRNONAA 67* 07/17/2012 1040   GFRAA 77* 07/17/2012 1040   No results found for this basename: CHOL,  HDL,  LDLCALC,  LDLDIRECT,  TRIG,  CHOLHDL   No results found for this basename: HGBA1C   No results found for this basename: VITAMINB12   Lab Results  Component Value Date   TSH 0.527 08/10/2011   06/12/12 ESR - 53 (h)  06/30/12 MRI brain (w/wo) - mild chronic small vessel ischemic disease; mild atrophy; C1-C2  articulation fluid  06/30/12 ESR 31, CRP 129.7  07/21/12 right temporal artery biopsy - An orcein stain is performed which shows minimal disruption of the elastic lamina; however, the presence of focal mural chronic inflammation is supportive of temporal (giant cell) arteritis given a corresponding clinical impression. Please correlate with clinical impression. Dr Frederica Kuster has seen this case in consultation with agreement. (RH:ecj 07/23/2012)  ASSESSMENT AND PLAN  78 y.o. year old female  has a past medical history of Overactive bladder; GERD (gastroesophageal reflux disease); Goiter; Headache(784.0); Arthritis; Glaucoma; and Hypertension. here with right-sided temporal headache with jaw pain. Elevated CRP and positive biopsy. Has respond well to prednisone.   Dx: Temporal Arteritis  PLAN: 1. Reduce prednisone to 50mg  daily 2. Check labs today  Suanne Marker, MD 07/29/2012 4:30 PM  Certified in Neurology, Neurophysiology and Neuroimaging  [facility] [address], [address] [place], [place] [address] [phone]

## 2012-07-30 LAB — BASIC METABOLIC PANEL
BUN/Creatinine Ratio: 32 — ABNORMAL HIGH (ref 11–26)
BUN: 28 mg/dL — ABNORMAL HIGH (ref 8–27)
CO2: 31 mmol/L — ABNORMAL HIGH (ref 18–29)
Calcium: 9.2 mg/dL (ref 8.6–10.2)
Chloride: 104 mmol/L (ref 97–108)
Creatinine, Ser: 0.88 mg/dL (ref 0.57–1.00)
GFR calc Af Amer: 74 mL/min/{1.73_m2} (ref >59)
GFR calc non Af Amer: 64 mL/min/{1.73_m2} (ref >59)
Glucose: 117 mg/dL — ABNORMAL HIGH (ref 65–99)
Potassium: 4.5 mmol/L (ref 3.5–5.2)
Sodium: 142 mmol/L (ref 134–144)

## 2012-07-30 LAB — CBC WITH DIFFERENTIAL
Basophils Absolute: 0 10*3/uL (ref 0.0–0.2)
Basos: 0 % (ref 0–3)
Eos: 0 % (ref 0–5)
Eosinophils Absolute: 0 10*3/uL (ref 0.0–0.4)
HCT: 38.9 % (ref 34.0–46.6)
Hemoglobin: 12.5 g/dL (ref 11.1–15.9)
Immature Grans (Abs): 0 10*3/uL (ref 0.0–0.1)
Immature Granulocytes: 0 % (ref 0–2)
Lymphocytes Absolute: 0.5 10*3/uL — ABNORMAL LOW (ref 0.7–3.1)
Lymphs: 5 % — ABNORMAL LOW (ref 14–46)
MCH: 29.5 pg (ref 26.6–33.0)
MCHC: 32.1 g/dL (ref 31.5–35.7)
MCV: 92 fL (ref 79–97)
Monocytes Absolute: 0.1 10*3/uL (ref 0.1–0.9)
Monocytes: 1 % — ABNORMAL LOW (ref 4–12)
Neutrophils Absolute: 9.5 10*3/uL — ABNORMAL HIGH (ref 1.4–7.0)
Neutrophils Relative %: 94 % — ABNORMAL HIGH (ref 40–74)
Platelets: 217 10*3/uL (ref 150–379)
RBC: 4.24 x10E6/uL (ref 3.77–5.28)
RDW: 16.1 % — ABNORMAL HIGH (ref 12.3–15.4)
WBC: 10.2 10*3/uL (ref 3.4–10.8)

## 2012-07-30 LAB — SEDIMENTATION RATE: Sed Rate: 6 mm/hr (ref 0–40)

## 2012-08-03 ENCOUNTER — Encounter: Payer: Self-pay | Admitting: Diagnostic Neuroimaging

## 2012-08-04 ENCOUNTER — Encounter: Payer: Self-pay | Admitting: Diagnostic Neuroimaging

## 2012-08-04 NOTE — Telephone Encounter (Signed)
Called patient to answer my chart questions. No answer. Will try later.

## 2012-08-05 ENCOUNTER — Telehealth: Payer: Self-pay | Admitting: Diagnostic Neuroimaging

## 2012-08-05 ENCOUNTER — Telehealth: Payer: Self-pay

## 2012-08-05 NOTE — Telephone Encounter (Signed)
Tried reaching patient a few times in reference to My Chart questions. No answer. Will reply to my chart to have patient call in.

## 2012-08-14 ENCOUNTER — Encounter (INDEPENDENT_AMBULATORY_CARE_PROVIDER_SITE_OTHER): Payer: Self-pay | Admitting: Surgery

## 2012-08-14 ENCOUNTER — Ambulatory Visit (INDEPENDENT_AMBULATORY_CARE_PROVIDER_SITE_OTHER): Payer: Medicare Other | Admitting: Surgery

## 2012-08-14 VITALS — BP 112/68 | HR 82 | Resp 18 | Ht 63.5 in | Wt 119.4 lb

## 2012-08-14 DIAGNOSIS — Z09 Encounter for follow-up examination after completed treatment for conditions other than malignant neoplasm: Secondary | ICD-10-CM

## 2012-08-14 NOTE — Progress Notes (Signed)
NAME: Janet Wilson                                            DOB: July 20, 1934 DATE: 08/14/2012                                                  MRN: [id number]  CC:  Chief Complaint  Patient presents with  . Follow-up    po temp bx    HPI: This patient comes in for post op follow-up .Sheunderwent TA Bx on 6/9. She feels that she is doing well.  PE:  VITAL SIGNS: BP 112/68  Pulse 82  Resp 18  Ht 5' 3.5" (1.613 m)  Wt 119 lb 6.4 oz (54.159 kg)  BMI 20.82 kg/m2  General: The patient appears to be healthy, NAD Incision  DATA REVIEWED: Diagnosis Artery, temporal, biopsy, Right - BENIGN VESSEL WITH FOCAL MURAL INFLAMMATION, SEE COMMENT. - NO TUMOR SEEN. Microscopic Comment An orcein stain is performed which shows minimal disruption of the elastic lamina; however, the presence of focal mural chronic inflammation is supportive of temporal (giant cell) arteritis given a corresponding clinical impression. Please correlate with clinical impression. Dr Frederica Kuster has seen this case in consultation with agreement. (RH:ecj 07/23/2012) Zandra Abts MD Pathologist, Electronic Signature (Case signed 07/23/2012)  IMPRESSION: The patient is doing well S/P TA Bx.    PLAN: RTC PRN I gave the patient a copy of the pathology report and reviewed it with her

## 2012-08-14 NOTE — Patient Instructions (Signed)
We will see you again on an as needed basis. Please call the office at [phone] if you have any questions or concerns. Thank you for allowing us to take care of you.  

## 2012-08-18 ENCOUNTER — Encounter: Payer: Self-pay | Admitting: Diagnostic Neuroimaging

## 2012-08-29 ENCOUNTER — Telehealth: Payer: Self-pay | Admitting: Diagnostic Neuroimaging

## 2012-09-01 ENCOUNTER — Telehealth: Payer: Self-pay | Admitting: Diagnostic Neuroimaging

## 2012-09-01 MED ORDER — PREDNISONE 10 MG PO TABS: 40.0000 mg | ORAL_TABLET | Freq: Every day | ORAL | Status: DC

## 2012-09-01 NOTE — Telephone Encounter (Signed)
Dr. Marjory Lies has spoken to the patient at 3:02

## 2012-09-01 NOTE — Telephone Encounter (Signed)
Getting to the end of dosage of prednisone and not experiencing headaches any more, needs to know whether she can decrease to four. Please call

## 2012-09-01 NOTE — Telephone Encounter (Signed)
I called patient. Doing well. I will reduce prednisone to 40mg . She can follow up in 1-2 months. Please setup appt. -VRP

## 2012-09-12 ENCOUNTER — Telehealth: Payer: Self-pay | Admitting: Diagnostic Neuroimaging

## 2012-09-16 NOTE — Telephone Encounter (Signed)
I called pt and she relayed that she is taking the 40mg  prednisone daily, already osteopenic.  Noted that she has bilateral blurry vision in am (worse in am R>L)  and noted puffiness around eyes.  She relates this to prednisone.  Has appt on 09-26-12 with Heide Guile, NP and asks about lab work.  I will relate that on 09-26-12 that Arlene here with [facility] is out of office along with the week following.  Would you like to have labs done prior to coming in and then be able to go over.  Please advise.

## 2012-09-17 ENCOUNTER — Other Ambulatory Visit: Payer: Self-pay

## 2012-09-17 NOTE — Telephone Encounter (Signed)
I called patient. Having focus problems, but no headache or pain. She went to eye doctor and they are monitoring vision. We will see patient on 09/26/12, and she can have blood tests done then (ESR and CRP).  Suanne Marker, MD 09/17/2012, 5:58 PM Certified in Neurology, Neurophysiology and Neuroimaging  [facility] [address], [address] [place], [place] [address] [phone]

## 2012-09-23 ENCOUNTER — Telehealth: Payer: Self-pay

## 2012-09-23 NOTE — Telephone Encounter (Signed)
Message copied by [facility] on Tue Sep 23, 2012  1:48 PM ------      Message from: Seth Bake      Created: Mon Sep 15, 2012  3:05 PM      Contact: Patient       Calling again today regarding the Prednisone .  Requests a call back today.  [phone]  ------

## 2012-09-23 NOTE — Telephone Encounter (Signed)
Message copied by [facility] on Tue Sep 23, 2012  1:50 PM ------      Message from: Seth Bake      Created: Mon Sep 15, 2012  3:05 PM      Contact: Patient       Calling again today regarding the Prednisone .  Requests a call back today.  [phone]  ------

## 2012-09-23 NOTE — Telephone Encounter (Signed)
I called patient to see if she continued to have questions about the prednisone she was on. She said she spoke with the doctor and is still unsure if some trouble with her eyes feeling heavy is from the prednisone or if it's her disease. She stated that she is coming in on Thursday and will address it further them. I reviewed her appointment schedule. She is scheduled for Friday, not Thursday and 8:30 a.m. Patient thanked me for the clarification and the call.

## 2012-09-24 ENCOUNTER — Other Ambulatory Visit: Payer: Self-pay | Admitting: Nurse Practitioner

## 2012-09-24 DIAGNOSIS — M316 Other giant cell arteritis: Secondary | ICD-10-CM

## 2012-09-25 ENCOUNTER — Other Ambulatory Visit: Payer: Self-pay | Admitting: Nurse Practitioner

## 2012-09-25 ENCOUNTER — Telehealth: Payer: Self-pay | Admitting: Neurology

## 2012-09-25 ENCOUNTER — Other Ambulatory Visit: Payer: Self-pay | Admitting: *Deleted

## 2012-09-25 DIAGNOSIS — M316 Other giant cell arteritis: Secondary | ICD-10-CM

## 2012-09-25 NOTE — Telephone Encounter (Signed)
Spoke to patient and she is going to come in for her lab work today, since [place] will not be here on Friday.  Patient's appointment is on 09-26-12 with Larita Fife.

## 2012-09-26 ENCOUNTER — Encounter: Payer: Self-pay | Admitting: Nurse Practitioner

## 2012-09-26 ENCOUNTER — Ambulatory Visit (INDEPENDENT_AMBULATORY_CARE_PROVIDER_SITE_OTHER): Payer: Medicare Other | Admitting: Nurse Practitioner

## 2012-09-26 VITALS — BP 133/84 | HR 88 | Ht 63.0 in | Wt 123.0 lb

## 2012-09-26 DIAGNOSIS — R519 Headache, unspecified: Secondary | ICD-10-CM

## 2012-09-26 DIAGNOSIS — M316 Other giant cell arteritis: Secondary | ICD-10-CM

## 2012-09-26 DIAGNOSIS — R51 Headache: Secondary | ICD-10-CM

## 2012-09-26 LAB — SEDIMENTATION RATE: Sed Rate: 7 mm/hr (ref 0–40)

## 2012-09-26 LAB — C-REACTIVE PROTEIN: CRP: 4 mg/L (ref 0.0–4.9)

## 2012-09-26 MED ORDER — PREDNISONE 10 MG PO TABS: 35.0000 mg | ORAL_TABLET | Freq: Every day | ORAL | Status: DC

## 2012-09-26 NOTE — Progress Notes (Addendum)
[facility]  PATIENT: Janet Wilson DOB: 03-30-1934   HISTORY FROM: patient REASON FOR VISIT: follow up temporal arteritis   HISTORICAL  CHIEF COMPLAINT:  Chief Complaint  Patient presents with  . Follow-up    Rv #[id number]    HISTORY OF PRESENT ILLNESS: UPDATE 09/26/12 (LL): Since last visit, has continued to improve with Prednisone.  ESR is now 7 and CRP 4.  HA and jaw pain have not recurred, but she states she has "strange sensation" sometimes over right parietal area that feels most like an ache.  Blurry vision mostly in the mornings.  She is bothered by the swelling and puffiness from the prednisone around her eyes and her cheeks.  Her opthalmologist is following her in 3 months, happy with her progress.  Couple plans a 10-day trip to [place] in September.  UPDATE 07/29/12: Since last visit, has done well with prednisone. HA and jaw pain are essentially resolved. Mild dull HA in early morning. Tolerated biopsy, which confirms temporal arteritis.   PRIOR HPI (06/30/12): Janet Wilson is a 78 year old white lady with c/o worsening right temporal headaches that started 5 weeks ago.  Since that time she has been experiencing extreme fatigue and steady weight loss. She describes right sided headache, with sharp pain and pain with chewing. She is also experiencing throat/neck pain intermittently. She has seen her dentist to r/o TMJ, dentist did not think pain was coming from TMJ.  Went to the PCP on 06/12/12, with headache and rx carbamazepine, but stopped due to dizziness and lack of effect. Went to ER on 06/18/12 with HA and chest heaviness. Spouse states that the veins over her right forehead were enlarged and pulsating. Cardiac workup negative. MRI of brain was unremarkable. Was d/c'd from hospital with a 4 day course of prednisone which did not relieve the pain.  Since the beginning 5 weeks ago, she feels like the pain was the worst at the beginning, then tapered off some  but had intermittent sharp jabbing pains daily with residual soreness, and today in office feels slightly better.  Migraines since a teenager, went to headache clinic here in [place] years ago. Typical migraine was right-sided throbbing over the right eye. She had an aura of a "different feeling" but no specific vision changes. Photophobia and phonophobia typical, seldom nausea. Was controlled with Zomig. She states she had migraines a few times a year, last one being about a year ago.  She states she lost her sense of smell about 20 years ago, preceded by smelling things that were not really there, such as garlic. She has glaucoma in the left eye, and cataracts removed in both eyes.   REVIEW OF SYSTEMS: Full 14 system review of systems performed and notable only for:  Constitutional: N/A  Cardiovascular: N/A  Ear/Nose/Throat: N/A  Skin: N/A  Eyes: blurred vision  Respiratory: N/A  Gastroitestinal: N/A  Genitourinary: Incontinence Hematology/Lymphatic: N/A  Endocrine: N/A Musculoskeletal:N/A  Allergy/Immunology: N/A  Neurological: N/A Psychiatric: N/A   ALLERGIES: Allergies  Allergen Reactions  . Oxycodone Anaphylaxis  . Codeine Nausea Only    HOME MEDICATIONS: Outpatient Prescriptions Prior to Visit  Medication Sig Dispense Refill  . aspirin EC 81 MG tablet Take 81 mg by mouth daily.      . Biotin (BIOTIN 5000) 5 MG CAPS Take 1 capsule by mouth daily.      . brinzolamide (AZOPT) 1 % ophthalmic suspension Place 1 drop into the left eye 2 (two) times daily.      Marland Kitchen  Calcium Carbonate-Vitamin D (CALCIUM PLUS VITAMIN D PO) Take 1 tablet by mouth daily.      . Coenzyme Q10 (CO Q 10) 100 MG CAPS Take 1 tablet by mouth daily.      . diazepam (VALIUM) 5 MG tablet Take 2.5 mg by mouth at bedtime.       Marland Kitchen estradiol (ESTRACE) 0.1 MG/GM vaginal cream Place 2 g vaginally once a week.      . magnesium oxide (MAG-OX) 400 MG tablet Take 400 mg by mouth daily with breakfast.      .  nitrofurantoin, macrocrystal-monohydrate, (MACROBID) 100 MG capsule Take 100 mg by mouth 2 (two) times daily as needed (bladder infection).      . Omega-3 Fatty Acids (FISH OIL) 1200 MG CAPS Take 1 capsule by mouth daily.      Marland Kitchen omeprazole (PRILOSEC) 20 MG capsule Take 20 mg by mouth daily with breakfast.      . Probiotic Product (PROBIOTIC PO) Take 1 capsule by mouth daily.      Marland Kitchen zolmitriptan (ZOMIG) 5 MG tablet Take 5 mg by mouth daily as needed for migraine. May repeat after 2 hours if headache returns.      . predniSONE (DELTASONE) 10 MG tablet Take 4 tablets (40 mg total) by mouth daily.  180 tablet  6   No facility-administered medications prior to visit.    PAST MEDICAL HISTORY: Past Medical History  Diagnosis Date  . Overactive bladder   . GERD (gastroesophageal reflux disease)   . Goiter     CAUSING COUGH, HOARSINESS AND DRY THROAT  . Headache(784.0)     MIGRAINES - TAKES VERAPAMIL FOR PREVENTION OF MIGRAINES  . Arthritis     OA BOTH KNEES AND HANDS  . Glaucoma     left eye  . Hypertension     not on any BP meications    PAST SURGICAL HISTORY: Past Surgical History  Procedure Laterality Date  . Cervical fusion  2012  . Left shoulder surgery    . Incontinence surgery    . Right knee arthroscopy  11/2009    . Eye surgery      CATARACT EXTRACTION- BILATERAL  . Total knee arthroplasty  07/04/2011    Procedure: TOTAL KNEE ARTHROPLASTY;  Surgeon: Loanne Drilling, MD;  Location: [facility];  Service: Orthopedics;  Laterality: Right;  . Joint replacement      right knee  . Artery biopsy Right 07/21/2012    Procedure: BIOPSY TEMPORAL ARTERY;  Surgeon: Currie Paris, MD;  Location: [facility];  Service: General;  Laterality: Right;    FAMILY HISTORY: Family History  Problem Relation Age of Onset  . Alzheimer's disease Mother   . Heart attack Father     SOCIAL HISTORY: History   Social History  . Marital Status: Married    Spouse Name: Greggory Stallion    Number of Children: 2    . Years of Education: college   Occupational History  .     Social History Main Topics  . Smoking status: Never Smoker   . Smokeless tobacco: Never Used  . Alcohol Use: Yes     Comment: SELDOM  . Drug Use: No  . Sexual Activity: Yes   Other Topics Concern  . Not on file   Social History Narrative   Pt lives at home with family.   Caffeine Use: Very little.      PHYSICAL EXAM  Filed Vitals:   09/26/12 0809  BP: 133/84  Pulse:  88  Height: 5\' 3"  (1.6 m)  Weight: 123 lb (55.792 kg)   Body mass index is 21.79 kg/(m^2).  GENERAL EXAM:  Patient is in no distress, pleasant, well developed, well groomed female.  CARDIOVASCULAR:  Regular rate and rhythm, no murmurs, no carotid bruits; RIGHT PREAURICULAR INCISION SITE, CDI. NO RIGHT TEMP ART PULSE PALPABLE. LEFT TEMPORAL ARTERY DECREASED PULSE NO TENDERNESS.  NEUROLOGIC:  MENTAL STATUS: awake, alert, language fluent, comprehension intact, naming intact  CRANIAL NERVE: no papilledema on fundoscopic exam, pupils REACTIVE. Visual fields full to confrontation, extraocular muscles intact, no nystagmus, facial sensation and strength symmetric, uvula midline, shoulder shrug symmetric, tongue midline.  MOTOR: normal bulk and tone, full strength in the BUE, BLE  SENSORY: normal and symmetric to light touch, pinprick, temperature, vibration  COORDINATION: finger-nose-finger, fine finger movements normal  REFLEXES: deep tendon reflexes present and symmetric  GAIT/STATION: narrow based gait; able to walk on toes, heels and tandem; romberg is negative  DIAGNOSTIC DATA (LABS, IMAGING, TESTING) - I reviewed patient records, labs, notes, testing and imaging myself where available.  Lab Results  Component Value Date   WBC 10.2 07/29/2012   HGB 12.5 07/29/2012   HCT 38.9 07/29/2012   MCV 92 07/29/2012   PLT 217 07/29/2012      Component Value Date/Time   NA 142 07/29/2012 1606   NA 140 07/17/2012 1040   K 4.5 07/29/2012 1606   CL 104  07/29/2012 1606   CO2 31* 07/29/2012 1606   GLUCOSE 117* 07/29/2012 1606   GLUCOSE 82 07/17/2012 1040   BUN 28* 07/29/2012 1606   BUN 21 07/17/2012 1040   CREATININE 0.88 07/29/2012 1606   CALCIUM 9.2 07/29/2012 1606   PROT 6.8 08/15/2011 1620   ALBUMIN 3.7 08/15/2011 1620   AST 14 08/15/2011 1620   ALT 9 08/15/2011 1620   ALKPHOS 73 08/15/2011 1620   BILITOT 0.3 08/15/2011 1620   GFRNONAA 64 07/29/2012 1606   GFRAA 74 07/29/2012 1606   No results found for this basename: CHOL,  HDL,  LDLCALC,  LDLDIRECT,  TRIG,  CHOLHDL   No results found for this basename: HGBA1C   No results found for this basename: VITAMINB12   Lab Results  Component Value Date   TSH 0.527 08/10/2011   06/12/12 ESR - 53 (h)  06/30/12 MRI brain (w/wo) - mild chronic small vessel ischemic disease; mild atrophy; C1-C2 articulation fluid  06/30/12 ESR 31, CRP 129.7  07/21/12 right temporal artery biopsy - An orcein stain is performed which shows minimal disruption of the elastic lamina; however, the presence of focal mural chronic inflammation is supportive of temporal (giant cell) arteritis given a corresponding clinical impression. Please correlate with clinical impression. Dr Frederica Kuster has seen this case in consultation with agreement. (RH:ecj 07/23/2012). 09/26/12 ESR 7, CRP 4.  ASSESSMENT AND PLAN 78 y.o. year old female has a past medical history of Overactive bladder; GERD (gastroesophageal reflux disease); Goiter; Headache(784.0); Arthritis; Glaucoma; and Hypertension. here with right-sided temporal headache with jaw pain. Elevated CRP and positive biopsy. Has respond well to prednisone.  Dx: Temporal Arteritis   PLAN: 1. Reduce prednisone to 35mg  daily. Decrease Predisone by 5 mg (1/2 tablet) every 2 weeks.  Every 2 weeks if symptoms of headache (like before) and jaw pain do not recur, continue to reduce your dose. 2. Return in 2 months, come in day before for labs, ESR and CRP.   Meds ordered this encounter  Medications  . predniSONE  (DELTASONE) 10 MG  tablet    Sig: Take 3.5 tablets (35 mg total) by mouth daily.    Dispense:  180 tablet    Refill:  6    Order Specific Question:  Supervising Provider    Answer:  Huston Foley [[id number]]     Jennice Renegar NP-C 09/26/2012, 8:56 AM  [facility] [address], [address] [place], [place] [address] [phone]  I reviewed the above note and documentation and agree with the history, physical exam, assessment and plan as outlined above.

## 2012-09-26 NOTE — Patient Instructions (Addendum)
Decrease Predisone by 5 mg (1/2 tablet) every 2 weeks.  Every 2 weeks if symptoms of headache (like before) and jaw pain do not recur, continue to reduce your dose.  Follow up in 2 months for labs.

## 2012-09-28 ENCOUNTER — Encounter: Payer: Self-pay | Admitting: Nurse Practitioner

## 2012-09-30 ENCOUNTER — Encounter: Payer: Self-pay | Admitting: Nurse Practitioner

## 2012-10-12 ENCOUNTER — Encounter: Payer: Self-pay | Admitting: Nurse Practitioner

## 2012-11-20 ENCOUNTER — Other Ambulatory Visit (INDEPENDENT_AMBULATORY_CARE_PROVIDER_SITE_OTHER): Payer: Self-pay

## 2012-11-20 ENCOUNTER — Other Ambulatory Visit: Payer: Self-pay | Admitting: Nurse Practitioner

## 2012-11-20 DIAGNOSIS — Z0289 Encounter for other administrative examinations: Secondary | ICD-10-CM

## 2012-11-21 ENCOUNTER — Ambulatory Visit: Payer: Medicare Other | Admitting: Nurse Practitioner

## 2012-11-21 LAB — SEDIMENTATION RATE: Sed Rate: 3 mm/hr (ref 0–40)

## 2012-11-21 LAB — C-REACTIVE PROTEIN: CRP: 1.2 mg/L (ref 0.0–4.9)

## 2012-11-21 NOTE — Progress Notes (Signed)
Made in error. Please disregard.

## 2012-11-23 ENCOUNTER — Encounter: Payer: Self-pay | Admitting: Nurse Practitioner

## 2012-11-24 ENCOUNTER — Ambulatory Visit: Payer: Medicare Other | Admitting: Nurse Practitioner

## 2012-11-25 ENCOUNTER — Encounter: Payer: Self-pay | Admitting: Nurse Practitioner

## 2012-11-25 ENCOUNTER — Ambulatory Visit (INDEPENDENT_AMBULATORY_CARE_PROVIDER_SITE_OTHER): Payer: Medicare Other | Admitting: Nurse Practitioner

## 2012-11-25 VITALS — BP 130/85 | HR 91 | Temp 97.6°F | Ht 63.0 in | Wt 128.0 lb

## 2012-11-25 DIAGNOSIS — M316 Other giant cell arteritis: Secondary | ICD-10-CM

## 2012-11-25 MED ORDER — PREDNISONE 1 MG PO TABS: ORAL_TABLET | ORAL | Status: DC

## 2012-11-25 NOTE — Patient Instructions (Signed)
Reduce prednisone to 15 mg daily. Decrease Predisone by 1 mg every weeks Every week if symptoms of headache (like before) and jaw pain do not recur, continue to reduce your dose.   Come back for quick visit in 5 weeks.

## 2012-11-25 NOTE — Progress Notes (Signed)
[facility]  PATIENT: Janet Wilson DOB: [birth year]   REASON FOR VISIT: follow up HISTORY FROM: patient  HISTORY OF PRESENT ILLNESS: UPDATE 11/25/12 (LL):  Patient doing well. ESR 3;  CRP 1.2.  No recurrent symptoms.  Doing well, has swelling and water retension from prednisone.  UPDATE 09/26/12 (LL): Since last visit, has continued to improve with Prednisone. ESR is now 7 and CRP 4. HA and jaw pain have not recurred, but she states she has "strange sensation" sometimes over right parietal area that feels most like an ache. Blurry vision mostly in the mornings. She is bothered by the swelling and puffiness from the prednisone around her eyes and her cheeks. Her opthalmologist is following her in 3 months, happy with her progress. Couple plans a 10-day trip to [place] in September.   UPDATE 07/29/12: Since last visit, has done well with prednisone. HA and jaw pain are essentially resolved. Mild dull HA in early morning. Tolerated biopsy, which confirms temporal arteritis.   PRIOR HPI (06/30/12): Janet Wilson is a 78 year old white lady with c/o worsening right temporal headaches that started 5 weeks ago.  Since that time she has been experiencing extreme fatigue and steady weight loss. She describes right sided headache, with sharp pain and pain with chewing. She is also experiencing throat/neck pain intermittently. She has seen her dentist to r/o TMJ, dentist did not think pain was coming from TMJ.  Went to the PCP on 06/12/12, with headache and rx carbamazepine, but stopped due to dizziness and lack of effect. Went to ER on 06/18/12 with HA and chest heaviness. Spouse states that the veins over her right forehead were enlarged and pulsating. Cardiac workup negative. MRI of brain was unremarkable. Was d/c'd from hospital with a 4 day course of prednisone which did not relieve the pain.  Since the beginning 5 weeks ago, she feels like the pain was the worst at the beginning,  then tapered off some but had intermittent sharp jabbing pains daily with residual soreness, and today in office feels slightly better.  Migraines since a teenager, went to headache clinic here in [place] years ago. Typical migraine was right-sided throbbing over the right eye. She had an aura of a "different feeling" but no specific vision changes. Photophobia and phonophobia typical, seldom nausea. Was controlled with Zomig. She states she had migraines a few times a year, last one being about a year ago.  She states she lost her sense of smell about 20 years ago, preceded by smelling things that were not really there, such as garlic. She has glaucoma in the left eye, and cataracts removed in both eyes.   REVIEW OF SYSTEMS: Full 14 system review of systems performed and notable only for:  Constitutional: weight gain  Cardiovascular: swelling in legs  Ear/Nose/Throat: N/A  Skin: N/A  Eyes: blurred vision  Respiratory: N/A  Gastroitestinal: N/A  Genitourinary: N/A Hematology/Lymphatic: easy bruising  Endocrine: flushing  Musculoskeletal:N/A  Allergy/Immunology: N/A  Neurological: N/A  Psychiatric: N/A  ALLERGIES: Allergies  Allergen Reactions  . Oxycodone Anaphylaxis  . Codeine Nausea Only    HOME MEDICATIONS: Outpatient Prescriptions Prior to Visit  Medication Sig Dispense Refill  . aspirin EC 81 MG tablet Take 81 mg by mouth daily.      . Biotin (BIOTIN 5000) 5 MG CAPS Take 1 capsule by mouth daily.      . brinzolamide (AZOPT) 1 % ophthalmic suspension Place 1 drop into the left eye 2 (two)  times daily.      . Calcium Carbonate-Vitamin D (CALCIUM PLUS VITAMIN D PO) Take 1 tablet by mouth daily.      . Coenzyme Q10 (CO Q 10) 100 MG CAPS Take 1 tablet by mouth daily.      . diazepam (VALIUM) 5 MG tablet Take 2.5 mg by mouth at bedtime.       Marland Kitchen estradiol (ESTRACE) 0.1 MG/GM vaginal cream Place 2 g vaginally once a week.      . magnesium oxide (MAG-OX) 400 MG tablet Take 400 mg  by mouth daily with breakfast.      . nitrofurantoin, macrocrystal-monohydrate, (MACROBID) 100 MG capsule Take 100 mg by mouth 2 (two) times daily as needed (bladder infection).      . Omega-3 Fatty Acids (FISH OIL) 1200 MG CAPS Take 1 capsule by mouth daily.      Marland Kitchen omeprazole (PRILOSEC) 20 MG capsule Take 20 mg by mouth daily with breakfast.      . predniSONE (DELTASONE) 10 MG tablet Take 3.5 tablets (35 mg total) by mouth daily.  180 tablet  6  . Probiotic Product (PROBIOTIC PO) Take 1 capsule by mouth daily.      Marland Kitchen zolmitriptan (ZOMIG) 5 MG tablet Take 5 mg by mouth daily as needed for migraine. May repeat after 2 hours if headache returns.       No facility-administered medications prior to visit.    PAST MEDICAL HISTORY: Past Medical History  Diagnosis Date  . Overactive bladder   . GERD (gastroesophageal reflux disease)   . Goiter     CAUSING COUGH, HOARSINESS AND DRY THROAT  . Headache(784.0)     MIGRAINES - TAKES VERAPAMIL FOR PREVENTION OF MIGRAINES  . Arthritis     OA BOTH KNEES AND HANDS  . Glaucoma     left eye  . Hypertension     not on any BP meications    PAST SURGICAL HISTORY: Past Surgical History  Procedure Laterality Date  . Cervical fusion  2012  . Left shoulder surgery    . Incontinence surgery    . Right knee arthroscopy  11/2009    . Eye surgery      CATARACT EXTRACTION- BILATERAL  . Total knee arthroplasty  07/04/2011    Procedure: TOTAL KNEE ARTHROPLASTY;  Surgeon: Loanne Drilling, MD;  Location: [facility];  Service: Orthopedics;  Laterality: Right;  . Joint replacement      right knee  . Artery biopsy Right 07/21/2012    Procedure: BIOPSY TEMPORAL ARTERY;  Surgeon: Currie Paris, MD;  Location: [facility];  Service: General;  Laterality: Right;    FAMILY HISTORY: Family History  Problem Relation Age of Onset  . Alzheimer's disease Mother   . Heart attack Father     SOCIAL HISTORY: History   Social History  . Marital Status: Married     Spouse Name: Greggory Stallion    Number of Children: 2  . Years of Education: college   Occupational History  .     Social History Main Topics  . Smoking status: Never Smoker   . Smokeless tobacco: Never Used  . Alcohol Use: Yes     Comment: SELDOM  . Drug Use: No  . Sexual Activity: Yes   Other Topics Concern  . Not on file   Social History Narrative   Pt lives at home with family.   Caffeine Use: Very little.      PHYSICAL EXAM  Filed Vitals:  11/25/12 0830  BP: 130/85  Pulse: 91  Temp: 97.6 F (36.4 C)  TempSrc: Oral  Height: 5\' 3"  (1.6 m)  Weight: 128 lb (58.06 kg)   Body mass index is 22.68 kg/(m^2).  Generalized: Patient is in no distress, pleasant, well developed, well groomed female.  Head: normocephalic and atraumatic. Oropharynx benign  Neck: Supple, no carotid bruits  Cardiac: Regular rate rhythm, no murmur  RIGHT PREAURICULAR INCISION SITE, CDI. NO RIGHT TEMP ART PULSE PALPABLE. LEFT TEMPORAL ARTERY DECREASED PULSE NO TENDERNESS.  Musculoskeletal: No deformity   NEUROLOGIC:  MENTAL STATUS: awake, alert, language fluent, comprehension intact, naming intact  CRANIAL NERVE: no papilledema on fundoscopic exam, pupils REACTIVE. Visual fields full to confrontation, extraocular muscles intact, no nystagmus, facial sensation and strength symmetric, uvula midline, shoulder shrug symmetric, tongue midline.  MOTOR: normal bulk and tone, full strength in the BUE, BLE  SENSORY: normal and symmetric to light touch, pinprick, temperature, vibration  COORDINATION: finger-nose-finger, fine finger movements normal  REFLEXES: deep tendon reflexes present and symmetric  GAIT/STATION: narrow based gait; able to walk on toes, heels and tandem; romberg is negative  DIAGNOSTIC DATA (LABS, IMAGING, TESTING) - I reviewed patient records, labs, notes, testing and imaging myself where available.  Lab Results  Component Value Date   WBC 10.2 07/29/2012   HGB 12.5 07/29/2012   HCT  38.9 07/29/2012   MCV 92 07/29/2012   PLT 217 07/29/2012      Component Value Date/Time   NA 142 07/29/2012 1606   NA 140 07/17/2012 1040   K 4.5 07/29/2012 1606   CL 104 07/29/2012 1606   CO2 31* 07/29/2012 1606   GLUCOSE 117* 07/29/2012 1606   GLUCOSE 82 07/17/2012 1040   BUN 28* 07/29/2012 1606   BUN 21 07/17/2012 1040   CREATININE 0.88 07/29/2012 1606   CALCIUM 9.2 07/29/2012 1606   PROT 6.8 08/15/2011 1620   ALBUMIN 3.7 08/15/2011 1620   AST 14 08/15/2011 1620   ALT 9 08/15/2011 1620   ALKPHOS 73 08/15/2011 1620   BILITOT 0.3 08/15/2011 1620   GFRNONAA 64 07/29/2012 1606   GFRAA 74 07/29/2012 1606   06/12/12 ESR - 53 (h)  06/30/12 MRI brain (w/wo) - mild chronic small vessel ischemic disease; mild atrophy; C1-C2 articulation fluid  06/30/12 ESR 31, CRP 129.7  07/21/12 right temporal artery biopsy - An orcein stain is performed which shows minimal disruption of the elastic lamina; however, the presence of focal mural chronic inflammation is supportive of temporal (giant cell) arteritis given a corresponding clinical impression. Please correlate with clinical impression. Dr Frederica Kuster has seen this case in consultation with agreement. (RH:ecj 07/23/2012).  09/26/12 ESR 7, CRP 4.  11/20/12 ESR 3;  CRP 1.2   ASSESSMENT AND PLAN 78 y.o. year old female has a past medical history of Overactive bladder; GERD (gastroesophageal reflux disease); Goiter; Headache(784.0); Arthritis; Glaucoma; and Hypertension. here with right-sided temporal arteritis. Elevated CRP and positive biopsy. Has respond well to prednisone.   PLAN: 1. Reduce prednisone to 15 mg daily. Decrease Predisone by 1 mg every week.  Every week if symptoms of headache (like before) and jaw pain do not recur, continue to reduce your dose.  2. Return in 5 weeks.  Meds ordered this encounter  Medications  . predniSONE (DELTASONE) 1 MG tablet    Sig: Start with 15 tablets daily.  Every week, reduce dose by 1 tabs.    Dispense:  300 tablet    Refill:  3  Order Specific Question:  Supervising Provider    Answer:  Suanne Marker [[id number]]    Ronal Fear, MSN, NP-C 11/25/2012, 10:20 AM [facility] [address] [place], [place] [address] [phone]

## 2012-12-01 NOTE — Progress Notes (Signed)
I reviewed note and agree with plan.   Suanne Marker, MD 12/01/2012, 11:16 PM Certified in Neurology, Neurophysiology and Neuroimaging  [facility] [address], [address] [place], [place] [address] [phone]

## 2012-12-22 DIAGNOSIS — H401422 Capsular glaucoma with pseudoexfoliation of lens, left eye, moderate stage: Secondary | ICD-10-CM | POA: Insufficient documentation

## 2012-12-30 ENCOUNTER — Encounter: Payer: Self-pay | Admitting: Nurse Practitioner

## 2012-12-30 ENCOUNTER — Ambulatory Visit (INDEPENDENT_AMBULATORY_CARE_PROVIDER_SITE_OTHER): Payer: Medicare Other | Admitting: Nurse Practitioner

## 2012-12-30 VITALS — BP 142/81 | HR 92 | Temp 97.6°F | Ht 63.0 in | Wt 129.0 lb

## 2012-12-30 DIAGNOSIS — M316 Other giant cell arteritis: Secondary | ICD-10-CM

## 2012-12-30 NOTE — Patient Instructions (Addendum)
PLAN: 1. Decrease Predisone by 1 mg every other week if symptoms of headache (like before) and jaw pain do not recur, continue to reduce your dose.   When you get to 5 mg, stay at that dose daily. 2. Return in 2 months, sooner as needed.

## 2012-12-30 NOTE — Progress Notes (Addendum)
[facility]  PATIENT: Janet Wilson DOB: Jan 18, 1935   REASON FOR VISIT: follow up for temporal arteritis HISTORY FROM: patient  HISTORY OF PRESENT ILLNESS: UPDATE 12/30/12 (LL): Patient returns for a quick check up.  Doing well.  Prednisone now down to 10 mg daily. Complains of facial swelling and hair loss from prednisone, but feels good.  Only occasional headache, no specific temporal pain. UPDATE 11/25/12 (LL): Patient doing well. ESR 3; CRP 1.2. No recurrent symptoms. Doing well, has swelling and water retension from prednisone.  UPDATE 09/26/12 (LL): Since last visit, has continued to improve with Prednisone. ESR is now 7 and CRP 4. HA and jaw pain have not recurred, but she states she has "strange sensation" sometimes over right parietal area that feels most like an ache. Blurry vision mostly in the mornings. She is bothered by the swelling and puffiness from the prednisone around her eyes and her cheeks. Her opthalmologist is following her in 3 months, happy with her progress. Couple plans a 10-day trip to [place] in September.  UPDATE 07/29/12: Since last visit, has done well with prednisone. HA and jaw pain are essentially resolved. Mild dull HA in early morning. Tolerated biopsy, which confirms temporal arteritis.  PRIOR HPI (06/30/12): [name] is a 78 year old white lady with c/o worsening right temporal headaches that started 5 weeks ago.  Since that time she has been experiencing extreme fatigue and steady weight loss. She describes right sided headache, with sharp pain and pain with chewing. She is also experiencing throat/neck pain intermittently. She has seen her dentist to r/o TMJ, dentist did not think pain was coming from TMJ.  Went to the PCP on 06/12/12, with headache and rx carbamazepine, but stopped due to dizziness and lack of effect. Went to ER on 06/18/12 with HA and chest heaviness. Spouse states that the veins over her right forehead were  enlarged and pulsating. Cardiac workup negative. MRI of brain was unremarkable. Was d/c'd from hospital with a 4 day course of prednisone which did not relieve the pain.  Since the beginning 5 weeks ago, she feels like the pain was the worst at the beginning, then tapered off some but had intermittent sharp jabbing pains daily with residual soreness, and today in office feels slightly better.  Migraines since a teenager, went to headache clinic here in [place] years ago. Typical migraine was right-sided throbbing over the right eye. She had an aura of a "different feeling" but no specific vision changes. Photophobia and phonophobia typical, seldom nausea. Was controlled with Zomig. She states she had migraines a few times a year, last one being about a year ago.  She states she lost her sense of smell about 20 years ago, preceded by smelling things that were not really there, such as garlic. She has glaucoma in the left eye, and cataracts removed in both eyes.   REVIEW OF SYSTEMS: Full 14 system review of systems performed and notable only for:  Eyes: blurred vision  Respiratory: cough Gastroitestinal: incontinence Hematology/Lymphatic: easy bruising  Musculoskeletal: cramps   Psychiatric: N/A  Sleep: Snoring  ALLERGIES: Allergies  Allergen Reactions  . Oxycodone Anaphylaxis  . Codeine Nausea Only    HOME MEDICATIONS: Outpatient Prescriptions Prior to Visit  Medication Sig Dispense Refill  . aspirin EC 81 MG tablet Take 81 mg by mouth daily.      . Biotin (BIOTIN 5000) 5 MG CAPS Take 1 capsule by mouth daily.      Marland Kitchen  brinzolamide (AZOPT) 1 % ophthalmic suspension Place 1 drop into the left eye 2 (two) times daily.      . Calcium Carbonate-Vitamin D (CALCIUM PLUS VITAMIN D PO) Take 1 tablet by mouth daily.      . Coenzyme Q10 (CO Q 10) 100 MG CAPS Take 1 tablet by mouth daily.      . diazepam (VALIUM) 5 MG tablet Take 2.5 mg by mouth at bedtime.       Marland Kitchen estradiol (ESTRACE) 0.1 MG/GM  vaginal cream Place 2 g vaginally once a week.      . magnesium oxide (MAG-OX) 400 MG tablet Take 400 mg by mouth daily with breakfast.      . nitrofurantoin, macrocrystal-monohydrate, (MACROBID) 100 MG capsule Take 100 mg by mouth 2 (two) times daily as needed (bladder infection).      . Omega-3 Fatty Acids (FISH OIL) 1200 MG CAPS Take 1 capsule by mouth daily.      Marland Kitchen omeprazole (PRILOSEC) 20 MG capsule Take 20 mg by mouth daily with breakfast.      . predniSONE (DELTASONE) 1 MG tablet Start with 15 tablets daily.  Every week, reduce dose by 1 tabs.  300 tablet  3  . predniSONE (DELTASONE) 10 MG tablet Take 3.5 tablets (35 mg total) by mouth daily.  180 tablet  6  . Probiotic Product (PROBIOTIC PO) Take 1 capsule by mouth daily.      Marland Kitchen zolmitriptan (ZOMIG) 5 MG tablet Take 5 mg by mouth daily as needed for migraine. May repeat after 2 hours if headache returns.       No facility-administered medications prior to visit.    PAST MEDICAL HISTORY: Past Medical History  Diagnosis Date  . Overactive bladder   . GERD (gastroesophageal reflux disease)   . Goiter     CAUSING COUGH, HOARSINESS AND DRY THROAT  . Headache(784.0)     MIGRAINES - TAKES VERAPAMIL FOR PREVENTION OF MIGRAINES  . Arthritis     OA BOTH KNEES AND HANDS  . Glaucoma     left eye  . Hypertension     not on any BP meications    PAST SURGICAL HISTORY: Past Surgical History  Procedure Laterality Date  . Cervical fusion  2012  . Left shoulder surgery    . Incontinence surgery    . Right knee arthroscopy  11/2009    . Eye surgery      CATARACT EXTRACTION- BILATERAL  . Total knee arthroplasty  07/04/2011    Procedure: TOTAL KNEE ARTHROPLASTY;  Surgeon: Loanne Drilling, MD;  Location: [facility];  Service: Orthopedics;  Laterality: Right;  . Joint replacement      right knee  . Artery biopsy Right 07/21/2012    Procedure: BIOPSY TEMPORAL ARTERY;  Surgeon: Currie Paris, MD;  Location: [facility];  Service: General;   Laterality: Right;    FAMILY HISTORY: Family History  Problem Relation Age of Onset  . Alzheimer's disease Mother   . Heart attack Father     SOCIAL HISTORY: History   Social History  . Marital Status: Married    Spouse Name: Greggory Stallion    Number of Children: 2  . Years of Education: college   Occupational History  .     Social History Main Topics  . Smoking status: Never Smoker   . Smokeless tobacco: Never Used  . Alcohol Use: Yes     Comment: SELDOM  . Drug Use: No  . Sexual Activity: Yes  Other Topics Concern  . Not on file   Social History Narrative   Pt lives at home with family.   Caffeine Use: Very little.      PHYSICAL EXAM  Filed Vitals:   12/30/12 0819  BP: 142/81  Pulse: 92  Temp: 97.6 F (36.4 C)  TempSrc: Oral  Height: 5\' 3"  (1.6 m)  Weight: 129 lb (58.514 kg)   Body mass index is 22.86 kg/(m^2).  Generalized: Patient is in no distress, pleasant, well developed, well groomed female.  Head: normocephalic and atraumatic. Oropharynx benign  Neck: Supple, no carotid bruits  Cardiac: Regular rate rhythm, no murmur NO RIGHT TEMP ART PULSE PALPABLE. LEFT TEMPORAL ARTERY DECREASED PULSE NO TENDERNESS.  Musculoskeletal: No deformity   NEUROLOGIC:  MENTAL STATUS: awake, alert, language fluent, comprehension intact, naming intact  CRANIAL NERVE: PERRL, Visual fields full to confrontation, extraocular muscles intact, no nystagmus, facial sensation and strength symmetric, uvula midline, shoulder shrug symmetric, tongue midline.  MOTOR: normal bulk and tone, full strength in the BUE, BLE  SENSORY: normal and symmetric to light touch COORDINATION: finger-nose-finger, fine finger movements normal  REFLEXES: deep tendon reflexes present and symmetric  GAIT/STATION: narrow based gait; able to walk on toes, heels and tandem; romberg is negative  DIAGNOSTIC DATA (LABS, IMAGING, TESTING) - I reviewed patient records, labs, notes, testing and imaging myself  where available.  06/12/12 ESR - 53 (h)  06/30/12 MRI brain (w/wo) - mild chronic small vessel ischemic disease; mild atrophy; C1-C2 articulation fluid  06/30/12 ESR 31, CRP 129.7  07/21/12 right temporal artery biopsy - An orcein stain is performed which shows minimal disruption of the elastic lamina; however, the presence of focal mural chronic inflammation is supportive of temporal (giant cell) arteritis given a corresponding clinical impression. Please correlate with clinical impression. Dr Frederica Kuster has seen this case in consultation with agreement. (RH:ecj 07/23/2012).  09/26/12 ESR 7, CRP 4.  11/20/12 ESR 3; CRP 1.2   ASSESSMENT AND PLAN  77 y.o. year old female has a past medical history of Overactive bladder; GERD (gastroesophageal reflux disease); Goiter; Headache(784.0); Arthritis; Glaucoma; and Hypertension. here with right-sided temporal arteritis. Elevated CRP and positive biopsy. Has respond well to prednisone.   PLAN: 1. Decrease Predisone by 1 mg every other week if symptoms of headache (like before) and jaw pain do not recur, continue to reduce your dose.   When you get to 5 mg, stay at that dose daily. 2. Return in 2 months, to see Dr. Marjory Lies next time.  Ronal Fear, MSN, NP-C 12/30/2012, 8:49 AM [facility] [address], [address] [place], [place] [address] [phone]  Note: This document was prepared with digital dictation and possible smart phrase technology. Any transcriptional errors that result from this process are unintentional.  I reviewed the above note and documentation by the Nurse Practitioner and agree with the history, physical exam, assessment and plan as outlined above.

## 2013-01-22 NOTE — Progress Notes (Signed)
I reviewed note and agree with plan.   Janet Wilson R. Janet Sandy, MD  Certified in Neurology, Neurophysiology and Neuroimaging  [facility] [address], [address] [place], [place] [address] [phone]   

## 2013-02-09 ENCOUNTER — Ambulatory Visit
Admission: RE | Admit: 2013-02-09 | Discharge: 2013-02-09 | Disposition: A | Discharge status: Home / Self Care | Payer: Medicare Other | Source: Ambulatory Visit | Attending: Internal Medicine | Admitting: Internal Medicine

## 2013-02-09 ENCOUNTER — Other Ambulatory Visit: Payer: Self-pay | Admitting: Internal Medicine

## 2013-02-09 DIAGNOSIS — M533 Sacrococcygeal disorders, not elsewhere classified: Secondary | ICD-10-CM

## 2013-02-17 ENCOUNTER — Ambulatory Visit: Payer: Medicare Other | Admitting: Diagnostic Neuroimaging

## 2013-03-02 ENCOUNTER — Ambulatory Visit (INDEPENDENT_AMBULATORY_CARE_PROVIDER_SITE_OTHER): Payer: Medicare Other | Admitting: Diagnostic Neuroimaging

## 2013-03-02 ENCOUNTER — Encounter: Payer: Self-pay | Admitting: Diagnostic Neuroimaging

## 2013-03-02 VITALS — BP 113/75 | HR 104 | Temp 97.6°F | Ht 63.75 in | Wt 128.0 lb

## 2013-03-02 DIAGNOSIS — M542 Cervicalgia: Secondary | ICD-10-CM

## 2013-03-02 DIAGNOSIS — M316 Other giant cell arteritis: Secondary | ICD-10-CM

## 2013-03-02 MED ORDER — PREDNISONE 1 MG PO TABS: 5.0000 mg | ORAL_TABLET | Freq: Every day | ORAL | Status: DC

## 2013-03-02 NOTE — Progress Notes (Signed)
[facility]  PATIENT: Janet Wilson DOB: [birth year]   REASON FOR VISIT: follow up for temporal arteritis HISTORY FROM: patient  HISTORY OF PRESENT ILLNESS:  UPDATE 03/02/13: Since last visit, overall stable. Has been on prednisone 23m daily since Nov 2014. Now on 638mdaily. Having some vague right posterior neck pain and right parietal/temporal sensitivity, which is stable in spite of reducing prednisone level. Vision stable. Has seen eye doctor, and exam is stable. Also with some left shoulder blade sens, radiating down her left paraspinal region.  UPDATE 12/30/12 (LL): Patient returns for a quick check up.  Doing well.  Prednisone now down to 10 mg daily. Complains of facial swelling and hair loss from prednisone, but feels good.  Only occasional headache, no specific temporal pain.  UPDATE 11/25/12 (LL): Patient doing well. ESR 3; CRP 1.2. No recurrent symptoms. Doing well, has swelling and water retension from prednisone.   UPDATE 09/26/12 (LL): Since last visit, has continued to improve with Prednisone. ESR is now 7 and CRP 4. HA and jaw pain have not recurred, but she states she has "strange sensation" sometimes over right parietal area that feels most like an ache. Blurry vision mostly in the mornings. She is bothered by the swelling and puffiness from the prednisone around her eyes and her cheeks. Her opthalmologist is following her in 3 months, happy with her progress. Couple plans a 10-day trip to [place] September.   UPDATE 07/29/12: Since last visit, has done well with prednisone. HA and jaw pain are essentially resolved. Mild dull HA in early morning. Tolerated biopsy, which confirms temporal arteritis.   PRIOR HPI (06/30/12): Janet Wilson a 79ear old white lady with c/o worsening right temporal headaches that started 5 weeks ago.  Since that time she has been experiencing extreme fatigue and steady weight loss. She describes right sided headache,  with sharp pain and pain with chewing. She is also experiencing throat/neck pain intermittently. She has seen her dentist to r/o TMJ, dentist did not think pain was coming from TMJ.   Went to the PCP on 06/12/12, with headache and rx carbamazepine, but stopped due to dizziness and lack of effect. Went to ER on 06/18/12 with HA and chest heaviness. Spouse states that the veins over her right forehead were enlarged and pulsating. Cardiac workup negative. MRI of brain was unremarkable. Was d/c'd from hospital with a 4 day course of prednisone which did not relieve the pain.   Since the beginning 5 weeks ago, she feels like the pain was the worst at the beginning, then tapered off some but had intermittent sharp jabbing pains daily with residual soreness, and today in office feels slightly better.   Migraines since a teenager, [place] Typical migraine was right-sided throbbing over the right eye. She had an aura of a "different feeling" but no specific vision changes. Photophobia and phonophobia typical, seldom nausea. Was controlled with Zomig. She states she had migraines a few times a year, last one being about a year ago.   She states she lost her sense of smell about 20 years ago, preceded by smelling things that were not really there, such as garlic. She has glaucoma in the left eye, and cataracts removed in both eyes.   REVIEW OF SYSTEMS: Full 14 system review of systems performed and notable only for: Easy bruising incontinence of bladder light sens.   ALLERGIES: Allergies  Allergen Reactions  .  Oxycodone Anaphylaxis  . Codeine Nausea Only    HOME MEDICATIONS: Outpatient Prescriptions Prior to Visit  Medication Sig Dispense Refill  . aspirin EC 81 MG tablet Take 81 mg by mouth daily.      . Biotin (BIOTIN 5000) 5 MG CAPS Take 1 capsule by mouth daily.      . brinzolamide (AZOPT) 1 % ophthalmic suspension Place 1 drop into the left eye 2 (two)  times daily.      . Calcium Carbonate-Vitamin D (CALCIUM PLUS VITAMIN D PO) Take 1 tablet by mouth daily.      . Coenzyme Q10 (CO Q 10) 100 MG CAPS Take 1 tablet by mouth daily.      . diazepam (VALIUM) 5 MG tablet Take 2.5 mg by mouth at bedtime.       Marland Kitchen estradiol (ESTRACE) 0.1 MG/GM vaginal cream Place 2 g vaginally once a week.      . magnesium oxide (MAG-OX) 400 MG tablet Take 400 mg by mouth daily with breakfast.      . nitrofurantoin, macrocrystal-monohydrate, (MACROBID) 100 MG capsule Take 100 mg by mouth 2 (two) times daily as needed (bladder infection).      . Omega-3 Fatty Acids (FISH OIL) 1200 MG CAPS Take 1 capsule by mouth daily.      Marland Kitchen omeprazole (PRILOSEC) 20 MG capsule Take 20 mg by mouth daily with breakfast.      . Probiotic Product (PROBIOTIC PO) Take 1 capsule by mouth daily.      Marland Kitchen zolmitriptan (ZOMIG) 5 MG tablet Take 5 mg by mouth daily as needed for migraine. May repeat after 2 hours if headache returns.      . predniSONE (DELTASONE) 1 MG tablet Start with 15 tablets daily.  Every week, reduce dose by 1 tabs.  300 tablet  3  . predniSONE (DELTASONE) 10 MG tablet Take 3.5 tablets (35 mg total) by mouth daily.  180 tablet  6   No facility-administered medications prior to visit.    PAST MEDICAL HISTORY: Past Medical History  Diagnosis Date  . Overactive bladder   . GERD (gastroesophageal reflux disease)   . Goiter     CAUSING COUGH, HOARSINESS AND DRY THROAT  . Headache(784.0)     MIGRAINES - TAKES VERAPAMIL FOR PREVENTION OF MIGRAINES  . Arthritis     OA BOTH KNEES AND HANDS  . Glaucoma     left eye  . Hypertension     not on any BP meications    PAST SURGICAL HISTORY: Past Surgical History  Procedure Laterality Date  . Cervical fusion  2012  . Left shoulder surgery    . Incontinence surgery    . Right knee arthroscopy  11/2009    . Eye surgery      CATARACT EXTRACTION- BILATERAL  . Total knee arthroplasty  07/04/2011    Procedure: TOTAL KNEE  ARTHROPLASTY;  Surgeon: Gearlean Alf, MD;  Location: [facility];  Service: Orthopedics;  Laterality: Right;  . Joint replacement      right knee  . Artery biopsy Right 07/21/2012    Procedure: BIOPSY TEMPORAL ARTERY;  Surgeon: Haywood Lasso, MD;  Location: [facility];  Service: General;  Laterality: Right;    FAMILY HISTORY: Family History  Problem Relation Age of Onset  . Alzheimer's disease Mother   . Heart attack Father     SOCIAL HISTORY: History   Social History  . Marital Status: Married    Spouse Name: [name]  Number of Children: 2  . Years of Education: college   Occupational History  .     Social History Main Topics  . Smoking status: Never Smoker   . Smokeless tobacco: Never Used  . Alcohol Use: Yes     Comment: SELDOM  . Drug Use: No  . Sexual Activity: Yes   Other Topics Concern  . Not on file   Social History Narrative   Pt lives at home with family.   Caffeine Use: Very little.      PHYSICAL EXAM  Filed Vitals:   03/02/13 1432  BP: 113/75  Pulse: 104  Temp: 97.6 F (36.4 C)  TempSrc: Oral  Height: 5' 3.75" (1.619 m)  Weight: 128 lb (58.06 kg)   Body mass index is 22.15 kg/(m^2).  Generalized: Patient is in no distress, pleasant, well developed, well groomed female.  Head: normocephalic and atraumatic. Oropharynx benign  Neck: Supple, no carotid bruits  Cardiac: Regular rate rhythm, no murmur NO RIGHT TEMP ART PULSE PALPABLE. LEFT TEMPORAL ARTERY DECREASED PULSE NO TENDERNESS.  Musculoskeletal: No deformity   NEUROLOGIC:  MENTAL STATUS: awake, alert, language fluent, comprehension intact, naming intact  CRANIAL NERVE: PERRL, Visual fields full to confrontation, extraocular muscles intact, no nystagmus, facial sensation and strength symmetric, uvula midline, shoulder shrug symmetric, tongue midline.  MOTOR: normal bulk and tone, full strength in the BUE, BLE  SENSORY: normal and symmetric to light touch COORDINATION: finger-nose-finger,  fine finger movements normal  REFLEXES: deep tendon reflexes present and symmetric  GAIT/STATION: narrow based gait; able to walk on toes, heels and tandem; romberg is negative  DIAGNOSTIC DATA (LABS, IMAGING, TESTING) - I reviewed patient records, labs, notes, testing and imaging myself where available.  06/12/12 ESR - 53 (h)  06/30/12 MRI brain (w/wo) - mild chronic small vessel ischemic disease; mild atrophy; C1-C2 articulation fluid  06/30/12 ESR 31, CRP 129.7  07/21/12 right temporal artery biopsy - An orcein stain is performed which shows minimal disruption of the elastic lamina; however, the presence of focal mural chronic inflammation is supportive of temporal (giant cell) arteritis given a corresponding clinical impression. Please correlate with clinical impression. Dr Donato Heinz has seen this case in consultation with agreement. (RH:ecj 07/23/2012).  09/26/12 ESR 7, CRP 4.  11/20/12 ESR 3; CRP 1.2   ASSESSMENT AND PLAN  79 y.o. year old female has a past medical history of Overactive bladder; GERD (gastroesophageal reflux disease); Goiter; Headache(784.0); Arthritis; Glaucoma; and Hypertension. here with right-sided temporal arteritis. Elevated CRP and positive biopsy. Has respond well to prednisone.   PLAN: 1. On Mar 15, 2013 reduce prednisone to 12m daily. Then continue to reduce prednisone by 169mper day, every month: April 12, 2013 - 80m85mpril 1, 2015 - 3mg107may 1, 2015 - 2mg 42mne 1, 2015 - 1mg d45my August 12, 2013 - stop prednisone 2. Monitor symptoms. 3. MRI cervical spine (eval neck pain, left shoulder blade sens)  Orders Placed This Encounter  Procedures  . MR Cervical Spine W Wo Contrast   Return in about 3 months (around 05/31/2013).    VIKRAMPenni Bombard/19/28/67/6720 [id number] in Neurology, Neurophysiology and Neuroimaging  [facility] [address] [id number] [phone]

## 2013-03-02 NOTE — Patient Instructions (Signed)
On Mar 15, 2013 reduce prednisone to 5mg  daily.  Then continue to reduce prednisone by 1mg  per day, every month:  April 12, 2013 - 4mg  May 13, 2013 - 3mg   Jun 12, 2013 - 2mg   July 13, 2013 - 1mg  daily August 12, 2013 - stop prednisone  Monitor symptoms.

## 2013-03-03 ENCOUNTER — Ambulatory Visit: Payer: Medicare Other | Admitting: Diagnostic Neuroimaging

## 2013-03-05 DIAGNOSIS — M542 Cervicalgia: Secondary | ICD-10-CM | POA: Insufficient documentation

## 2013-03-11 ENCOUNTER — Ambulatory Visit
Admission: RE | Admit: 2013-03-11 | Discharge: 2013-03-11 | Disposition: A | Discharge status: Home / Self Care | Payer: Medicare Other | Source: Ambulatory Visit | Attending: Diagnostic Neuroimaging | Admitting: Diagnostic Neuroimaging

## 2013-03-11 DIAGNOSIS — M542 Cervicalgia: Secondary | ICD-10-CM

## 2013-03-11 DIAGNOSIS — M316 Other giant cell arteritis: Secondary | ICD-10-CM

## 2013-03-11 MED ORDER — GADOBENATE DIMEGLUMINE 529 MG/ML IV SOLN
12.0000 mL | Freq: Once | INTRAVENOUS | Status: AC | PRN
  Administered 2013-03-11: 12 mL via INTRAVENOUS

## 2013-03-12 ENCOUNTER — Encounter: Payer: Self-pay | Admitting: Nurse Practitioner

## 2013-03-13 ENCOUNTER — Encounter: Payer: Self-pay | Admitting: Nurse Practitioner

## 2013-04-20 ENCOUNTER — Ambulatory Visit: Payer: Medicare Other | Admitting: Diagnostic Neuroimaging

## 2013-05-04 ENCOUNTER — Other Ambulatory Visit: Payer: Self-pay | Admitting: Internal Medicine

## 2013-05-04 ENCOUNTER — Ambulatory Visit
Admission: RE | Admit: 2013-05-04 | Discharge: 2013-05-04 | Disposition: A | Discharge status: Home / Self Care | Payer: Medicare Other | Source: Ambulatory Visit | Attending: Internal Medicine | Admitting: Internal Medicine

## 2013-05-04 ENCOUNTER — Encounter: Payer: Self-pay | Admitting: Nurse Practitioner

## 2013-05-04 DIAGNOSIS — N1330 Unspecified hydronephrosis: Secondary | ICD-10-CM

## 2013-05-04 DIAGNOSIS — R109 Unspecified abdominal pain: Secondary | ICD-10-CM

## 2013-05-05 ENCOUNTER — Ambulatory Visit
Admission: RE | Admit: 2013-05-05 | Discharge: 2013-05-05 | Disposition: A | Discharge status: Home / Self Care | Payer: Medicare Other | Source: Ambulatory Visit | Attending: Internal Medicine | Admitting: Internal Medicine

## 2013-05-05 DIAGNOSIS — N1330 Unspecified hydronephrosis: Secondary | ICD-10-CM

## 2013-05-30 ENCOUNTER — Encounter: Payer: Self-pay | Admitting: *Deleted

## 2013-06-02 ENCOUNTER — Encounter: Payer: Self-pay | Admitting: Diagnostic Neuroimaging

## 2013-06-02 ENCOUNTER — Ambulatory Visit (INDEPENDENT_AMBULATORY_CARE_PROVIDER_SITE_OTHER): Payer: Medicare Other | Admitting: Diagnostic Neuroimaging

## 2013-06-02 VITALS — BP 113/70 | HR 92 | Ht 63.75 in | Wt 128.5 lb

## 2013-06-02 DIAGNOSIS — M316 Other giant cell arteritis: Secondary | ICD-10-CM

## 2013-06-02 NOTE — Progress Notes (Signed)
[facility]  PATIENT: Janet Wilson DOB: 05/23/1934   REASON FOR VISIT: follow up for temporal arteritis HISTORY FROM: patient  HISTORY OF PRESENT ILLNESS:  UPDATE 03/02/13: Since last visit, overall stable. Has been on prednisone 77m daily since Nov 2014. Now on 684mdaily. Having some vague right posterior neck pain and right parietal/temporal sensitivity, which is stable in spite of reducing prednisone level. Vision stable. Has seen eye doctor, and exam is stable. Also with some left shoulder blade sens, radiating down her left paraspinal region.  UPDATE 12/30/12 (LL): Patient returns for a quick check up.  Doing well.  Prednisone now down to 10 mg daily. Complains of facial swelling and hair loss from prednisone, but feels good.  Only occasional headache, no specific temporal pain.  UPDATE 11/25/12 (LL): Patient doing well. ESR 3; CRP 1.2. No recurrent symptoms. Doing well, has swelling and water retension from prednisone.   UPDATE 09/26/12 (LL): Since last visit, has continued to improve with Prednisone. ESR is now 7 and CRP 4. HA and jaw pain have not recurred, but she states she has "strange sensation" sometimes over right parietal area that feels most like an ache. Blurry vision mostly in the mornings. She is bothered by the swelling and puffiness from the prednisone around her eyes and her cheeks. Her opthalmologist is following her in 3 months, happy with her progress. Couple plans a 10-day trip to [place] September.   UPDATE 07/29/12: Since last visit, has done well with prednisone. HA and jaw pain are essentially resolved. Mild dull HA in early morning. Tolerated biopsy, which confirms temporal arteritis.   PRIOR HPI (06/30/12): Janet Wilson a [age]ear old white lady with c/o worsening right temporal headaches that started 5 weeks ago.  Since that time she has been experiencing extreme fatigue and steady weight loss. She describes right sided headache,  with sharp pain and pain with chewing. She is also experiencing throat/neck pain intermittently. She has seen her dentist to r/o TMJ, dentist did not think pain was coming from TMJ.   Went to the PCP on 06/12/12, with headache and rx carbamazepine, but stopped due to dizziness and lack of effect. Went to ER on 06/18/12 with HA and chest heaviness. Spouse states that the veins over her right forehead were enlarged and pulsating. Cardiac workup negative. MRI of brain was unremarkable. Was d/c'd from hospital with a 4 day course of prednisone which did not relieve the pain.   Since the beginning 5 weeks ago, she feels like the pain was the worst at the beginning, then tapered off some but had intermittent sharp jabbing pains daily with residual soreness, and today in office feels slightly better.   Migraines since a teenager, [place] Typical migraine was right-sided throbbing over the right eye. She had an aura of a "different feeling" but no specific vision changes. Photophobia and phonophobia typical, seldom nausea. Was controlled with Zomig. She states she had migraines a few times a year, last one being about a year ago.   She states she lost her sense of smell about 20 years ago, preceded by smelling things that were not really there, such as garlic. She has glaucoma in the left eye, and cataracts removed in both eyes.   REVIEW OF SYSTEMS: Full 14 system review of systems performed and notable only for: blurred vision freq urination.   ALLERGIES: Allergies  Allergen Reactions  . Oxycodone Anaphylaxis  .  Codeine Nausea Only    HOME MEDICATIONS: Outpatient Prescriptions Prior to Visit  Medication Sig Dispense Refill  . aspirin EC 81 MG tablet Take 81 mg by mouth daily.      . Biotin (BIOTIN 5000) 5 MG CAPS Take 1 capsule by mouth daily.      . brinzolamide (AZOPT) 1 % ophthalmic suspension Place 1 drop into the left eye 2 (two) times daily.      .  Calcium Carbonate-Vitamin D (CALCIUM PLUS VITAMIN D PO) Take 1 tablet by mouth daily.      . Coenzyme Q10 (CO Q 10) 100 MG CAPS Take 1 tablet by mouth daily.      . diazepam (VALIUM) 5 MG tablet Take 2.5 mg by mouth at bedtime.       Marland Kitchen estradiol (ESTRACE) 0.1 MG/GM vaginal cream Place 2 g vaginally once a week.      . magnesium oxide (MAG-OX) 400 MG tablet Take 400 mg by mouth daily with breakfast.      . nitrofurantoin, macrocrystal-monohydrate, (MACROBID) 100 MG capsule Take 100 mg by mouth 2 (two) times daily as needed (bladder infection).      . Omega-3 Fatty Acids (FISH OIL) 1200 MG CAPS Take 1 capsule by mouth daily.      Marland Kitchen omeprazole (PRILOSEC) 20 MG capsule Take 20 mg by mouth daily with breakfast.      . predniSONE (DELTASONE) 1 MG tablet Take 5 tablets (5 mg total) by mouth daily with breakfast. Reduce as directed.  150 tablet  12  . Probiotic Product (PROBIOTIC PO) Take 1 capsule by mouth daily.      Marland Kitchen zolmitriptan (ZOMIG) 5 MG tablet Take 5 mg by mouth daily as needed for migraine. May repeat after 2 hours if headache returns.       No facility-administered medications prior to visit.    PAST MEDICAL HISTORY: Past Medical History  Diagnosis Date  . Overactive bladder   . GERD (gastroesophageal reflux disease)   . Goiter     CAUSING COUGH, HOARSINESS AND DRY THROAT  . Headache(784.0)     MIGRAINES - TAKES VERAPAMIL FOR PREVENTION OF MIGRAINES  . Arthritis     OA BOTH KNEES AND HANDS  . Glaucoma     left eye  . Hypertension     not on any BP meications    PAST SURGICAL HISTORY: Past Surgical History  Procedure Laterality Date  . Cervical fusion  2012  . Left shoulder surgery    . Incontinence surgery    . Right knee arthroscopy  11/2009    . Eye surgery      CATARACT EXTRACTION- BILATERAL  . Total knee arthroplasty  07/04/2011    Procedure: TOTAL KNEE ARTHROPLASTY;  Surgeon: Gearlean Alf, MD;  Location: [facility];  Service: Orthopedics;  Laterality: Right;  . Joint  replacement      right knee  . Artery biopsy Right 07/21/2012    Procedure: BIOPSY TEMPORAL ARTERY;  Surgeon: Haywood Lasso, MD;  Location: [facility] OR;  Service: General;  Laterality: Right;    FAMILY HISTORY: Family History  Problem Relation Age of Onset  . Alzheimer's disease Mother   . Heart attack Father     SOCIAL HISTORY: History   Social History  . Marital Status: Married    Spouse Name: [name]    Number of Children: 2  . Years of Education: college   Occupational History  .     Social History Main Topics  .  Smoking status: Never Smoker   . Smokeless tobacco: Never Used  . Alcohol Use: Yes     Comment: SELDOM  . Drug Use: No  . Sexual Activity: Yes   Other Topics Concern  . Not on file   Social History Narrative   Pt lives at home with family.   Caffeine Use: Very little.      PHYSICAL EXAM  Filed Vitals:   06/02/13 1503  BP: 113/70  Pulse: 92  Height: 5' 3.75" (1.619 m)  Weight: 128 lb 8 oz (58.287 kg)   Body mass index is 22.24 kg/(m^2).  Generalized: Patient is in no distress, pleasant, well developed, well groomed female.  Head: normocephalic and atraumatic. Oropharynx benign  Neck: Supple, no carotid bruits  Cardiac: Regular rate rhythm, no murmur DECR RIGHT TEMP ART PULSE. LEFT TEMPORAL ARTERY PULSE PALPABLE.  Musculoskeletal: No deformity   NEUROLOGIC:  MENTAL STATUS: awake, alert, language fluent, comprehension intact, naming intact  CRANIAL NERVE: PERRL, Visual fields full to confrontation, extraocular muscles intact, no nystagmus, facial sensation and strength symmetric, uvula midline, shoulder shrug symmetric, tongue midline.  MOTOR: normal bulk and tone, full strength in the BUE, BLE  SENSORY: normal and symmetric to light touch COORDINATION: finger-nose-finger, fine finger movements normal  REFLEXES: deep tendon reflexes present and symmetric  GAIT/STATION: narrow based gait; romberg is negative   DIAGNOSTIC DATA (LABS, IMAGING,  TESTING) - I reviewed patient records, labs, notes, testing and imaging myself where available.  06/12/12 ESR - 53 (h)  06/30/12 MRI brain (w/wo) - mild chronic small vessel ischemic disease; mild atrophy; C1-C2 articulation fluid  06/30/12 ESR 31, CRP 129.7  07/21/12 right temporal artery biopsy - An orcein stain is performed which shows minimal disruption of the elastic lamina; however, the presence of focal mural chronic inflammation is supportive of temporal (giant cell) arteritis given a corresponding clinical impression. Please correlate with clinical impression. Dr Donato Heinz has seen this case in consultation with agreement. (RH:ecj 07/23/2012).  09/26/12 ESR 7, CRP 4.  11/20/12 ESR 3; CRP 1.2   ASSESSMENT AND PLAN  79 y.o. female here for follow up of right-sided temporal arteritis. Elevated CRP and positive biopsy. Has responded well to prednisone.   PLAN: 1. Taper prednisone: Jun 12, 2013 - 60m  July 13, 2013 - 177mdaily August 12, 2013 - stop prednisone  Return in about 3 months (around 09/01/2013).    VIPenni BombardMD 05/16/22/40103:2:72M Certified in Neurology, Neurophysiology and Neuroimaging  [facility] [address] [phone]

## 2013-06-02 NOTE — Patient Instructions (Signed)
Continue tapering prednisone down.  Jun 12, 2013 - 2mg   July 13, 2013 - 1mg  daily  August 12, 2013 - stop prednisone

## 2013-07-07 ENCOUNTER — Telehealth: Payer: Self-pay | Admitting: Diagnostic Neuroimaging

## 2013-07-07 NOTE — Telephone Encounter (Signed)
Patient calling to check on BW results from Dr Inda Merlin office.  Please call and advise.

## 2013-07-07 NOTE — Telephone Encounter (Signed)
I called Dr. Inda Merlin office / MR to see about getting lab results for Dr. Leta Baptist.

## 2013-07-07 NOTE — Telephone Encounter (Signed)
Results received. To Dr. Gladstone Lighter desk.  (pt called). ESR 36

## 2013-07-15 ENCOUNTER — Encounter: Payer: Self-pay | Admitting: Nurse Practitioner

## 2013-09-03 ENCOUNTER — Ambulatory Visit: Payer: Medicare Other | Admitting: Diagnostic Neuroimaging

## 2013-09-07 ENCOUNTER — Other Ambulatory Visit (INDEPENDENT_AMBULATORY_CARE_PROVIDER_SITE_OTHER): Payer: Self-pay

## 2013-09-07 ENCOUNTER — Other Ambulatory Visit: Payer: Self-pay | Admitting: Nurse Practitioner

## 2013-09-07 DIAGNOSIS — Z0289 Encounter for other administrative examinations: Secondary | ICD-10-CM

## 2013-09-07 DIAGNOSIS — M316 Other giant cell arteritis: Secondary | ICD-10-CM

## 2013-09-08 ENCOUNTER — Ambulatory Visit: Payer: Medicare Other | Admitting: Nurse Practitioner

## 2013-09-08 LAB — SEDIMENTATION RATE: Sed Rate: 15 mm/hr (ref 0–40)

## 2013-09-08 LAB — C-REACTIVE PROTEIN: CRP: 43 mg/L — ABNORMAL HIGH (ref 0.0–4.9)

## 2013-09-11 ENCOUNTER — Encounter: Payer: Self-pay | Admitting: Nurse Practitioner

## 2013-09-11 ENCOUNTER — Ambulatory Visit (INDEPENDENT_AMBULATORY_CARE_PROVIDER_SITE_OTHER): Payer: Medicare Other | Admitting: Nurse Practitioner

## 2013-09-11 VITALS — BP 120/74 | HR 87 | Ht 63.75 in | Wt 124.0 lb

## 2013-09-11 DIAGNOSIS — R51 Headache: Secondary | ICD-10-CM

## 2013-09-11 DIAGNOSIS — R519 Headache, unspecified: Secondary | ICD-10-CM

## 2013-09-11 DIAGNOSIS — M316 Other giant cell arteritis: Secondary | ICD-10-CM

## 2013-09-11 MED ORDER — PREDNISONE 1 MG PO TABS: 5.0000 mg | ORAL_TABLET | Freq: Every day | ORAL | Status: DC

## 2013-09-11 NOTE — Progress Notes (Signed)
PATIENT: Janet Wilson DOB: 1935-01-02  REASON FOR VISIT: routine follow up for temporal arteritis HISTORY FROM: patient  HISTORY OF PRESENT ILLNESS: UPDATE 09/11/13 (LL): Since last visit, had weaned prednisone to 2 mg, then 1 mg to off. She has been completely off for about 3-4 weeks now. She has had recurrence of headache in the last month with light sensitivity along with right posterior neck pain. Also mild aching and stiffness in the shoulders. She denies any pain or stiffness in her hips, buttocks or thighs. She states her vision is blurred closeup and has difficulty reading, but last check up with ophthalmologist told her exam is stable. Her hair has come back in thicker and curly. Labs checked on Monday shows CRP 43 and sed rate 15. She is scheduled for endoscopy with biopsy to evaluate for celiac disease with Dr. Collene Mares on August 8  UPDATE 03/02/13: Since last visit, overall stable. Has been on prednisone 5m daily since Nov 2014. Now on 644mdaily. Having some vague right posterior neck pain and right parietal/temporal sensitivity, which is stable in spite of reducing prednisone level. Vision stable. Has seen eye doctor, and exam is stable. Also with some left shoulder blade sens, radiating down her left paraspinal region.  UPDATE 12/30/12 (LL): Patient returns for a quick check up. Doing well. Prednisone now down to 10 mg daily. Complains of facial swelling and hair loss from prednisone, but feels good. Only occasional headache, no specific temporal pain.  UPDATE 11/25/12 (LL): Patient doing well. ESR 3; CRP 1.2. No recurrent symptoms. Doing well, has swelling and water retention from prednisone.  UPDATE 09/26/12 (LL): Since last visit, has continued to improve with Prednisone. ESR is now 7 and CRP 4. HA and jaw pain have not recurred, but she states she has "strange sensation" sometimes over right parietal area that feels most like an ache. Blurry vision mostly in the mornings. She  is bothered by the swelling and puffiness from the prednisone around her eyes and her cheeks. Her opthalmologist is following her in 3 months, happy with her progress. Couple plans a [place] trip [place].  UPDATE 07/29/12: Since last visit, has done well with prednisone. HA and jaw pain are essentially resolved. Mild dull HA in early morning. Tolerated biopsy, which confirms temporal arteritis.  PRIOR HPI (06/30/12): Janet Wilson a 79 year old white lady with c/o worsening right temporal headaches that started 5 weeks ago. Since that time she has been experiencing extreme fatigue and steady weight loss. She describes right sided headache, with sharp pain and pain with chewing. She is also experiencing throat/neck pain intermittently. She has seen her dentist to r/o TMJ, dentist did not think pain was coming from TMJ.  Went to the PCP on 06/12/12, with headache and rx carbamazepine, but stopped due to dizziness and lack of effect. Went to ER on 06/18/12 with HA and chest heaviness. Spouse states that the veins over her right forehead were enlarged and pulsating. Cardiac workup negative. MRI of brain was unremarkable. Was d/c'd from hospital with a 4 day course of prednisone which did not relieve the pain.  Since the beginning 5 weeks ago, she feels like the pain was the worst at the beginning, then tapered off some but had intermittent sharp jabbing pains daily with residual soreness, and today in office feels slightly better.  Migraines since a teenager, [place] Typical migraine was right-sided throbbing over the  right eye. She had an aura of a "different feeling" but no specific vision changes. Photophobia and phonophobia typical, seldom nausea. Was controlled with Zomig. She states she had migraines a few times a year, last one being about a year ago.  She states she lost her sense of smell about 20 years ago, preceded by smelling things that were not  really there, such as garlic. She has glaucoma in the left eye, and cataracts removed in both eyes.   REVIEW OF SYSTEMS: Full 14 system review of systems performed and notable only for: blurred vision, light sensitivity, cough, constipation, headache, bruise easily.  ALLERGIES: Allergies  Allergen Reactions  . Oxycodone Anaphylaxis  . Codeine Nausea Only    HOME MEDICATIONS: Outpatient Prescriptions Prior to Visit  Medication Sig Dispense Refill  . aspirin EC 81 MG tablet Take 81 mg by mouth daily.      . Biotin (BIOTIN 5000) 5 MG CAPS Take 1 capsule by mouth daily.      . brinzolamide (AZOPT) 1 % ophthalmic suspension Place 1 drop into the left eye 2 (two) times daily.      . Calcium Carbonate-Vitamin D (CALCIUM PLUS VITAMIN D PO) Take 1 tablet by mouth daily.      . Coenzyme Q10 (CO Q 10) 100 MG CAPS Take 1 tablet by mouth daily.      . diazepam (VALIUM) 5 MG tablet Take 2.5 mg by mouth at bedtime.       Marland Kitchen estradiol (ESTRACE) 0.1 MG/GM vaginal cream Place 2 g vaginally once a week.      . magnesium oxide (MAG-OX) 400 MG tablet Take 400 mg by mouth daily with breakfast.      . nitrofurantoin, macrocrystal-monohydrate, (MACROBID) 100 MG capsule Take 100 mg by mouth 2 (two) times daily as needed (bladder infection).      . Omega-3 Fatty Acids (FISH OIL) 1200 MG CAPS Take 1 capsule by mouth daily.      Marland Kitchen omeprazole (PRILOSEC) 20 MG capsule Take 20 mg by mouth daily with breakfast.      . Probiotic Product (PROBIOTIC PO) Take 1 capsule by mouth daily.      Marland Kitchen zolmitriptan (ZOMIG) 5 MG tablet Take 5 mg by mouth daily as needed for migraine. May repeat after 2 hours if headache returns.      . predniSONE (DELTASONE) 1 MG tablet Take 5 tablets (5 mg total) by mouth daily with breakfast. Reduce as directed.  150 tablet  12  . amoxicillin (AMOXIL) 875 MG tablet        No facility-administered medications prior to visit.    PHYSICAL EXAM Filed Vitals:   09/11/13 0841  BP: 120/74  Pulse: 87   Height: 5' 3.75" (1.619 m)  Weight: 124 lb (56.246 kg)   Body mass index is 21.46 kg/(m^2).  Generalized: Patient is in no distress, pleasant, well developed, well groomed female.  Head: normocephalic and atraumatic. Oropharynx benign  Neck: Supple, no carotid bruits Cardiac: Regular rate rhythm, no murmur DECR RIGHT TEMP ART PULSE. LEFT TEMPORAL ARTERY PULSE PALPABLE.  Musculoskeletal: No deformity, decreased range of motion with left to right rotation of neck   NEUROLOGIC:  MENTAL STATUS: awake, alert, language fluent, comprehension intact, naming intact  CRANIAL NERVE: PERRL, Visual fields full to confrontation, extraocular muscles intact, no nystagmus, facial sensation and strength symmetric, uvula midline, shoulder shrug symmetric, tongue midline.  MOTOR: normal bulk and tone, full strength in the BUE, BLE  SENSORY: normal and symmetric to  light touch  COORDINATION: finger-nose-finger, fine finger movements normal  REFLEXES: deep tendon reflexes present and symmetric  GAIT/STATION: narrow based gait; romberg is negative   DIAGNOSTIC DATA (LABS, IMAGING, TESTING) - I reviewed patient records, labs, notes, testing and imaging myself where available. - I reviewed patient records, labs, notes, testing and imaging myself where available.  06/12/12 ESR - 53 (h)  06/30/12 MRI brain (w/wo) - mild chronic small vessel ischemic disease; mild atrophy; C1-C2 articulation fluid  06/30/12 ESR 31, CRP 129.7  07/21/12 right temporal artery biopsy - An orcein stain is performed which shows minimal disruption of the elastic lamina; however, the presence of focal mural chronic inflammation is supportive of temporal (giant cell) arteritis given a corresponding clinical impression. Please correlate with clinical impression. Dr Donato Heinz has seen this case in consultation with agreement. (RH:ecj 07/23/2012).  09/26/12 ESR 7, CRP 4.  11/20/12 ESR 3; CRP 1.2  09/07/13 ESR 15; CRP 43  ASSESSMENT AND PLAN  79 y.o.  female here for follow up of right-sided temporal arteritis. Elevated CRP and positive biopsy. Had responded well to prednisone, but seems to be relapsing when prednisone was discontinued. Possibly showing signs of overlapping polymyalgia rheumatica.   PLAN: Prednisone: start back at 64m. I will contact in 1 week to see if symptoms are better.  If not increase to 5 mg.  If Dr. MCollene Mareswants to hold prednisone until after biopsy, she may do that.  Repeat labs in 4 weeks, I will enter lab orders she may drop in for labs.  Orders Placed This Encounter  Procedures  . C-reactive Protein  . Sedimentation Rate   Meds ordered this encounter  Medications  . predniSONE (DELTASONE) 1 MG tablet    Sig: Take 2-5 tablets (2-5 mg total) by mouth daily with breakfast. Reduce as directed.    Dispense:  150 tablet    Refill:  12   Lowell Mcgurk E. Arieanna Pressey, MSN, FNP-BC, A/GNP-C 09/12/2013, 11:11 AM [facility] [address] [place] [phone] Note: This document was prepared with digital dictation and possible smart phrase technology. Any transcriptional errors that result from this process are unintentional.

## 2013-09-11 NOTE — Patient Instructions (Signed)
Prednisone: start back at 2mg . I will contact in 1 week to see if symptoms are better.  If not increase to 5 mg. Repeat labs in 4 weeks, I will enter lab orders and you may drop in for labs.

## 2013-09-14 NOTE — Progress Notes (Signed)
I reviewed note and agree with plan.   Penni Bombard, MD 05/14/2818, 8:13 PM Certified in Neurology, Neurophysiology and Neuroimaging  [facility] [address], [place], [place] [address] [phone]

## 2013-09-18 ENCOUNTER — Telehealth: Payer: Self-pay | Admitting: Nurse Practitioner

## 2013-09-18 ENCOUNTER — Encounter: Payer: Self-pay | Admitting: Nurse Practitioner

## 2013-09-18 NOTE — Telephone Encounter (Signed)
Called Mrs. Mccree to see how she was feeling, no answer, left message to call back with update or send me a msg through [place].

## 2013-09-18 NOTE — Telephone Encounter (Signed)
Patient returning Janet Wilson's call, please call back and advise.

## 2013-09-20 NOTE — Telephone Encounter (Signed)
Responded on Mychart.

## 2013-10-04 ENCOUNTER — Encounter: Payer: Self-pay | Admitting: Nurse Practitioner

## 2013-10-05 ENCOUNTER — Other Ambulatory Visit: Payer: Self-pay | Admitting: Internal Medicine

## 2013-10-05 ENCOUNTER — Ambulatory Visit
Admission: RE | Admit: 2013-10-05 | Discharge: 2013-10-05 | Disposition: A | Discharge status: Home / Self Care | Payer: Medicare Other | Source: Ambulatory Visit | Attending: Internal Medicine | Admitting: Internal Medicine

## 2013-10-05 DIAGNOSIS — M25559 Pain in unspecified hip: Secondary | ICD-10-CM

## 2013-10-12 ENCOUNTER — Telehealth: Payer: Self-pay | Admitting: Nurse Practitioner

## 2013-10-12 NOTE — Telephone Encounter (Signed)
Patient questioning if blood work is needed prior to appt on 9/8?  Please return call and may leave detailed message on voice mail.

## 2013-10-13 NOTE — Telephone Encounter (Signed)
Called pt and left message informing her per OV on 09/11/13 with Jeani Hawking, NP that pt needed to have labs repeated in 4 weeks, which would be before pt's appt on 10/20/13 and that pt could stop by the office to have that done, the orders have already been put in and if she has any other problems, questions or concerns to call the office.

## 2013-10-15 ENCOUNTER — Other Ambulatory Visit (INDEPENDENT_AMBULATORY_CARE_PROVIDER_SITE_OTHER): Payer: Self-pay

## 2013-10-15 DIAGNOSIS — M316 Other giant cell arteritis: Secondary | ICD-10-CM

## 2013-10-15 DIAGNOSIS — Z0289 Encounter for other administrative examinations: Secondary | ICD-10-CM

## 2013-10-15 DIAGNOSIS — R51 Headache: Secondary | ICD-10-CM

## 2013-10-15 DIAGNOSIS — R519 Headache, unspecified: Secondary | ICD-10-CM

## 2013-10-16 LAB — C-REACTIVE PROTEIN: CRP: 8.2 mg/L — ABNORMAL HIGH (ref 0.0–4.9)

## 2013-10-16 LAB — SEDIMENTATION RATE: Sed Rate: 9 mm/hr (ref 0–40)

## 2013-10-20 ENCOUNTER — Encounter: Payer: Self-pay | Admitting: Nurse Practitioner

## 2013-10-20 ENCOUNTER — Ambulatory Visit (INDEPENDENT_AMBULATORY_CARE_PROVIDER_SITE_OTHER): Payer: Medicare Other | Admitting: Nurse Practitioner

## 2013-10-20 VITALS — BP 135/80 | HR 90 | Ht 63.25 in | Wt 120.0 lb

## 2013-10-20 DIAGNOSIS — R51 Headache: Secondary | ICD-10-CM

## 2013-10-20 DIAGNOSIS — M316 Other giant cell arteritis: Secondary | ICD-10-CM

## 2013-10-20 DIAGNOSIS — M810 Age-related osteoporosis without current pathological fracture: Secondary | ICD-10-CM

## 2013-10-20 DIAGNOSIS — R519 Headache, unspecified: Secondary | ICD-10-CM

## 2013-10-20 NOTE — Patient Instructions (Signed)
Continue Prednisone 5 mg daily. Follow up in late October with Dr. Jaynee Eagles after your appointment with orthopedics.

## 2013-10-20 NOTE — Progress Notes (Signed)
PATIENT: Janet Wilson DOB: 1934/07/06  REASON FOR VISIT: routine follow up for temporal arteritis HISTORY FROM: patient, husband  HISTORY OF PRESENT ILLNESS: UPDATE 10/20/13 (LL): Since last visit, started Prednisone back at 2 mg then increased to 5 mg after 1 week due to still having headache. Headache is better but still having "sharp jabs" of pain intermittently in right temporal/parietal region.  Labs 5 days ago were ESR 8.2,  CRP 9.  Xray of hips shows mild symmetric degenerative change of the hips which is stable, she was having bilateral hip pain.  She has made appt with Orthopedist to be evaluated for avascular necrosis in October.  She states her osteopenia has now progressed to osteoporosis.   UPDATE 09/11/13 (LL): Since last visit, had weaned prednisone to 2 mg, then 1 mg to off. She has been completely off for about 3-4 weeks now. She has had recurrence of headache in the last month with light sensitivity along with right posterior neck pain. Also mild aching and stiffness in the shoulders. She denies any pain or stiffness in her hips, buttocks or thighs. She states her vision is blurred closeup and has difficulty reading, but last check up with ophthalmologist told her exam is stable. Her hair has come back in thicker and curly. Labs checked on Monday shows CRP 43 and sed rate 15. She is scheduled for endoscopy with biopsy to evaluate for celiac disease with Dr. Collene Mares on August 8.  UPDATE 03/02/13: (VP): Since last visit, overall stable. Has been on prednisone 65m daily since Nov 2014. Now on 647mdaily. Having some vague right posterior neck pain and right parietal/temporal sensitivity, which is stable in spite of reducing prednisone level. Vision stable. Has seen eye doctor, and exam is stable. Also with some left shoulder blade sens, radiating down her left paraspinal region.  UPDATE 12/30/12 (LL): Patient returns for a quick check up. Doing well. Prednisone now down to 10 mg  daily. Complains of facial swelling and hair loss from prednisone, but feels good. Only occasional headache, no specific temporal pain.  UPDATE 11/25/12 (LL): Patient doing well. ESR 3; CRP 1.2. No recurrent symptoms. Doing well, has swelling and water retention from prednisone.  UPDATE 09/26/12 (LL): Since last visit, has continued to improve with Prednisone. ESR is now 7 and CRP 4. HA and jaw pain have not recurred, but she states she has "strange sensation" sometimes over right parietal area that feels most like an ache. Blurry vision mostly in the mornings. She is bothered by the swelling and puffiness from the prednisone around her eyes and her cheeks. Her opthalmologist is following her in 3 months, happy with her progress. Couple plans a 10-day trip to [place] September.  UPDATE 07/29/12: Since last visit, has done well with prednisone. HA and jaw pain are essentially resolved. Mild dull HA in early morning. Tolerated biopsy, which confirms temporal arteritis.  PRIOR HPI (06/30/12): Janet Wilson a 79ear old white lady with c/o worsening right temporal headaches that started 5 weeks ago. Since that time she has been experiencing extreme fatigue and steady weight loss. She describes right sided headache, with sharp pain and pain with chewing. She is also experiencing throat/neck pain intermittently. She has seen her dentist to r/o TMJ, dentist did not think pain was coming from TMJ.  Went to the PCP on 06/12/12, with headache and rx carbamazepine, but stopped due to dizziness and lack of effect. Went to ER on 06/18/12 with HA  and chest heaviness. Spouse states that the veins over her right forehead were enlarged and pulsating. Cardiac workup negative. MRI of brain was unremarkable. Was d/c'd from hospital with a 4 day course of prednisone which did not relieve the pain.  Since the beginning 5 weeks ago, she feels like the pain was the worst at the beginning, then tapered off some but had intermittent  sharp jabbing pains daily with residual soreness, and today in office feels slightly better.  Migraines since a teenager, went to headache clinic here in [place] years ago. Typical migraine was right-sided throbbing over the right eye. She had an aura of a "different feeling" but no specific vision changes. Photophobia and phonophobia typical, seldom nausea. Was controlled with Zomig. She states she had migraines a few times a year, last one being about a year ago.  She states she lost her sense of smell about 20 years ago, preceded by smelling things that were not really there, such as garlic. She has glaucoma in the left eye, and cataracts removed in both eyes.  REVIEW OF SYSTEMS: Full 14 system review of systems performed and notable only for: blurred vision, light sensitivity, cough, constipation, headache, bruise easily.    ALLERGIES: Allergies  Allergen Reactions  . Oxycodone Anaphylaxis  . Codeine Nausea Only    HOME MEDICATIONS: Outpatient Prescriptions Prior to Visit  Medication Sig Dispense Refill  . amoxicillin (AMOXIL) 875 MG tablet       . aspirin EC 81 MG tablet Take 81 mg by mouth daily.      . Biotin (BIOTIN 5000) 5 MG CAPS Take 1 capsule by mouth daily.      . brinzolamide (AZOPT) 1 % ophthalmic suspension Place 1 drop into the left eye 2 (two) times daily.      . Calcium Carbonate-Vitamin D (CALCIUM PLUS VITAMIN D PO) Take 1 tablet by mouth daily.      . Coenzyme Q10 (CO Q 10) 100 MG CAPS Take 1 tablet by mouth daily.      . diazepam (VALIUM) 5 MG tablet Take 2.5 mg by mouth at bedtime.       Marland Kitchen estradiol (ESTRACE) 0.1 MG/GM vaginal cream Place 2 g vaginally once a week.      . magnesium oxide (MAG-OX) 400 MG tablet Take 400 mg by mouth daily with breakfast.      . nitrofurantoin, macrocrystal-monohydrate, (MACROBID) 100 MG capsule Take 100 mg by mouth 2 (two) times daily as needed (bladder infection).      . Omega-3 Fatty Acids (FISH OIL) 1200 MG CAPS Take 1 capsule by  mouth daily.      Marland Kitchen omeprazole (PRILOSEC) 20 MG capsule Take 20 mg by mouth daily with breakfast.      . predniSONE (DELTASONE) 1 MG tablet Take 5 tablets (5 mg total) by mouth daily with breakfast. Reduce as directed.  150 tablet  12  . Probiotic Product (PROBIOTIC PO) Take 1 capsule by mouth daily.      Marland Kitchen zolmitriptan (ZOMIG) 5 MG tablet Take 5 mg by mouth daily as needed for migraine. May repeat after 2 hours if headache returns.      Marland Kitchen ZOSTAVAX 65993 UNT/0.65ML injection        No facility-administered medications prior to visit.    PHYSICAL EXAM Filed Vitals:   10/20/13 1140  BP: 135/80  Pulse: 90  Height: 5' 3.25" (1.607 m)  Weight: 120 lb (54.432 kg)   Body mass index is 21.08 kg/(m^2).  Generalized:  Well developed, in no acute distress  Head: normocephalic and atraumatic. Oropharynx benign  Neck: Supple, no carotid bruits  Cardiac: Regular rate rhythm, no murmur  Musculoskeletal: No deformity   Neurological examination  Mentation: Alert oriented to time, place, history taking. Follows all commands speech and language fluent Cranial nerve II-XII: Fundoscopic exam not done. Pupils were equal round reactive to light extraocular movements were full, visual field were full on confrontational test. Facial sensation and strength were normal. hearing was intact to finger rubbing bilaterally. Uvula tongue midline. head turning and shoulder shrug and were normal and symmetric.Tongue protrusion into cheek strength was normal. Motor: The motor testing reveals 5 over 5 strength of all 4 extremities. Good symmetric motor tone is noted throughout.  Sensory: Sensory testing is intact to soft touch on all 4 extremities. No evidence of extinction is noted.  Coordination: Cerebellar testing reveals good finger-nose-finger and heel-to-shin bilaterally.  Gait and station: Gait is normal. Tandem gait is normal. Romberg is negative. Reflexes: Deep tendon reflexes are symmetric and normal  bilaterally.   DIAGNOSTIC DATA (LABS, IMAGING, TESTING) - I reviewed patient records, labs, notes, testing and imaging myself where available.  06/12/12 ESR - 53 (h)  06/30/12 MRI brain (w/wo) - mild chronic small vessel ischemic disease; mild atrophy; C1-C2 articulation fluid  06/30/12 ESR 31, CRP 129.7  07/21/12 right temporal artery biopsy - An orcein stain is performed which shows minimal disruption of the elastic lamina; however, the presence of focal mural chronic inflammation is supportive of temporal (giant cell) arteritis given a corresponding clinical impression. Please correlate with clinical impression. Dr Donato Heinz has seen this case in consultation with agreement. (RH:ecj 07/23/2012).  09/26/12 ESR 7, CRP 4.  11/20/12 ESR 3; CRP 1.2  09/07/13 ESR 15; CRP 43  10/15/13 ESR 8.2; CRP 9  ASSESSMENT: 79 y.o. female here for follow up of right-sided temporal arteritis. Elevated CRP and positive biopsy. Had responded well to prednisone, but relapsed when prednisone was discontinued. Possibly showing signs of overlapping polymyalgia rheumatica, with bilateral hip pain. She has upcoming appointment with Orthopedics in October, concern for avascular necrosis. May consider switching to alternative treatment than Prednisone in the future.  PLAN: Continue Prednisone 5 mg daily. Follow up in late October with Dr. Jaynee Eagles after your appointment with orthopedics. Repeat labs a few days before next office visit, I will enter lab orders she may drop in for labs.   Orders Placed This Encounter  Procedures  . C-reactive Protein  . Sedimentation Rate   Chadd Tollison E. Etheridge Geil, MSN, FNP-BC, A/GNP-C 10/20/2013, 12:16 PM [facility] [address], [place], [place] [address] [phone]  Note: This document was prepared with digital dictation and possible smart phrase technology. Any transcriptional errors that result from this process are unintentional.

## 2013-10-22 ENCOUNTER — Ambulatory Visit: Payer: Medicare Other | Attending: Gynecologic Oncology | Admitting: Gynecologic Oncology

## 2013-10-22 ENCOUNTER — Encounter: Payer: Self-pay | Admitting: Gynecologic Oncology

## 2013-10-22 VITALS — BP 121/79 | HR 90 | Temp 98.1°F | Resp 20 | Ht 63.25 in | Wt 119.0 lb

## 2013-10-22 DIAGNOSIS — N83209 Unspecified ovarian cyst, unspecified side: Secondary | ICD-10-CM | POA: Insufficient documentation

## 2013-10-22 DIAGNOSIS — M1710 Unilateral primary osteoarthritis, unspecified knee: Secondary | ICD-10-CM | POA: Diagnosis not present

## 2013-10-22 DIAGNOSIS — I10 Essential (primary) hypertension: Secondary | ICD-10-CM | POA: Diagnosis not present

## 2013-10-22 DIAGNOSIS — IMO0002 Reserved for concepts with insufficient information to code with codable children: Secondary | ICD-10-CM | POA: Diagnosis not present

## 2013-10-22 DIAGNOSIS — M316 Other giant cell arteritis: Secondary | ICD-10-CM | POA: Diagnosis not present

## 2013-10-22 DIAGNOSIS — K219 Gastro-esophageal reflux disease without esophagitis: Secondary | ICD-10-CM | POA: Insufficient documentation

## 2013-10-22 DIAGNOSIS — N959 Unspecified menopausal and perimenopausal disorder: Secondary | ICD-10-CM | POA: Diagnosis not present

## 2013-10-22 DIAGNOSIS — R51 Headache: Secondary | ICD-10-CM | POA: Diagnosis not present

## 2013-10-22 DIAGNOSIS — N83299 Other ovarian cyst, unspecified side: Secondary | ICD-10-CM

## 2013-10-22 DIAGNOSIS — N83202 Unspecified ovarian cyst, left side: Secondary | ICD-10-CM

## 2013-10-22 NOTE — Patient Instructions (Signed)
Preparing for your Surgery  Plan for surgery on October 27 with Dr. Denman George at [facility].  Pre-operative [place] will receive a phone call from presurgical testing at [facility] to arrange for a pre-operative testing appointment before your surgery.  This appointment normally occurs one to two weeks before your scheduled surgery.   -Bring your insurance card, copy of an advanced directive if applicable, medication list  -At that visit, you will be asked to sign a consent for a possible blood transfusion in case a transfusion becomes necessary during surgery.  The need for a blood transfusion is rare but having consent is a necessary part of your care.     -You should not be taking blood thinners or aspirin at least ten days prior to surgery unless instructed by your surgeon.  Day Before Surgery at [place] will be asked to take in only clear liquids the day before surgery.  Examples of clear liquids include broths, jello, and clear juices.  You will be advised to have nothing to eat or drink after midnight the evening before.    Your role in recovery Your role is to become active as soon as directed by your doctor, while still giving yourself time to heal.  Rest when you feel tired. You will be asked to do the following in order to speed your recovery:  - Cough and breathe deeply. This helps toclear and expand your lungs and can prevent pneumonia. You may be given a spirometer to practice deep breathing. A staff member will show you how to use the spirometer. - Do mild physical activity. Walking or moving your legs help your circulation and body functions return to normal. A staff member will help you when you try to walk and will provide you with simple exercises. Do not try to get up or walk alone the first time. - Actively manage your pain. Managing your pain lets you move in comfort. We will ask you to rate your pain on a scale of zero to 10. It is your  responsibility to tell your doctor or nurse where and how much you hurt so your pain can be treated.  Special Considerations -If you are diabetic, you may be placed on insulin after surgery to have closer control over your blood sugars to promote healing and recovery.  This does not mean that you will be discharged on insulin.  If applicable, your oral antidiabetics will be resumed when you are tolerating a solid diet.  -Your final pathology results from surgery should be available by the Friday after surgery and the results will be relayed to you when available.

## 2013-10-22 NOTE — Progress Notes (Signed)
Consult Note: Gyn-Onc  Consult was requested by Dr. Carren Rang for the evaluation of Janet Wilson 79 y.o. female  CC:  Chief Complaint  Patient presents with  . Ovarian Cyst    New patient    Assessment/Plan:  [name]  is a 80 y.o.  with a complex left adnexal mass measuring 15 x 8 mm with a 7 mm solid area blood flow. CA 125 within normal limits. My recommendation is for a laparoscopic bilateral salpingo-oophorectomy with staging and other indicated procedures determined by the results of frozen section. It is unclear whether the temporal arteritis is stable so we will followup with Gardiner Rhyme NP for her assessment whether a higher steroid course is required prior to surgical procedure and for guidance regarding perioperative management. The patient and her husband are aware that the procedure may need to be converted to an open procedure and that decision will be dependent upon the surgical findings. The risks of the procedure shared were inclusive of infection bleeding damage to surrounding structures prolonged hospitalization and reoperation. All of their questions were answered to her satisfaction. The plan is for the procedure to occur on 12/08/2013. They're aware that Dr. Everitt Amber will be her surgeon.  HPI: [name]  is a 79 y.o.   G2P2 LNMP in herr 40's.  [name] reports the presence of more abdominal pain predominantly in the left for approximately one year. States the pain occasionally radiates to the back. The workup has included findings of arthritis and diverticulosis however an ultrasound on 08/25/2048 was notable for presence of a 10 by 95mm left ovarian cysts with a 7 mm solid area. Blood flow was seen within the solid area no free fluid was noted. A repeat scan in September of 2015 noted the same left ovarian cyst it now measures 15 x 8 mm. Septations were appreciated with internal echoes in the same 7 mm solid area. A CA 125  returned to value of 5.  Her history is notable for right temporal arteritis requiring chronic steroid use for approximately 1-1/2 years. The highest dose was 60 mg of prednisone a day. She's currently at 5 mg daily with that concerned that the rising sed rates may indicate a need to increase his dosage to 10 mg per day.  Review of Systems:  Constitutional  Feels well, Cardiovascular  No chest pain, shortness of breath, or edema  Pulmonary  No cough or wheeze.  Gastro Intestinal  No nausea, vomitting, or diarrhoea. No bright red blood per rectum, no abdominal pain, change in bowel movement, or constipation. No early satiety, no abdominal bloating. Genito Urinary  No frequency, urgency, dysuria,no vaginal discharge or bleeding Musculo Skeletal  No myalgia, arthralgia, joint swelling or pain  Neurologic  No weakness, numbness, change in gait,  Psychology  No depression, anxiety, insomnia.    Current Meds:  Outpatient Encounter Prescriptions as of 10/22/2013  Medication Sig  . amoxicillin (AMOXIL) 875 MG tablet   . aspirin EC 81 MG tablet Take 81 mg by mouth daily.  . Biotin (BIOTIN 5000) 5 MG CAPS Take 1 capsule by mouth daily.  . brinzolamide (AZOPT) 1 % ophthalmic suspension Place 1 drop into the left eye 2 (two) times daily.  . Calcium Carbonate-Vitamin D (CALCIUM PLUS VITAMIN D PO) Take 1 tablet by mouth daily.  . Coenzyme Q10 (CO Q 10) 100 MG CAPS Take 1 tablet by mouth daily.  . diazepam (VALIUM) 5 MG tablet  Take 2.5 mg by mouth at bedtime.   Marland Kitchen estradiol (ESTRACE) 0.1 MG/GM vaginal cream Place 2 g vaginally once a week.  . magnesium oxide (MAG-OX) 400 MG tablet Take 400 mg by mouth daily with breakfast.  . nitrofurantoin, macrocrystal-monohydrate, (MACROBID) 100 MG capsule Take 100 mg by mouth 2 (two) times daily as needed (bladder infection).  . Omega-3 Fatty Acids (FISH OIL) 1200 MG CAPS Take 1 capsule by mouth daily.  Marland Kitchen omeprazole (PRILOSEC) 20 MG capsule Take 20 mg by  mouth daily with breakfast.  . predniSONE (DELTASONE) 1 MG tablet Take 5 tablets (5 mg total) by mouth daily with breakfast. Reduce as directed.  . Probiotic Product (PROBIOTIC PO) Take 1 capsule by mouth daily.  Marland Kitchen zolmitriptan (ZOMIG) 5 MG tablet Take 5 mg by mouth daily as needed for migraine. May repeat after 2 hours if headache returns.  Marland Kitchen ZOSTAVAX 47829 UNT/0.65ML injection     Allergy:  Allergies  Allergen Reactions  . Oxycodone Anaphylaxis  . Codeine Nausea Only    Social Hx:   History   Social History  . Marital Status: Married    Spouse Name: [name]    Number of Children: 2  . Years of Education: college   Occupational History  . retired    Social History Main Topics  . Smoking status: Never Smoker   . Smokeless tobacco: Never Used  . Alcohol Use: Yes     Comment: SELDOM  . Drug Use: No  . Sexual Activity: Yes   Other Topics Concern  . Not on file   Social History Narrative   Pt lives at home with family.   Caffeine Use: Very little.     Past Surgical Hx:  Past Surgical History  Procedure Laterality Date  . Cervical fusion  2012  . Left shoulder surgery    . Incontinence surgery    . Right knee arthroscopy  11/2009    . Eye surgery      CATARACT EXTRACTION- BILATERAL  . Total knee arthroplasty  07/04/2011    Procedure: TOTAL KNEE ARTHROPLASTY;  Surgeon: Gearlean Alf, MD;  Location: [facility];  Service: Orthopedics;  Laterality: Right;  . Joint replacement      right knee  . Artery biopsy Right 07/21/2012    Procedure: BIOPSY TEMPORAL ARTERY;  Surgeon: Haywood Lasso, MD;  Location: [facility];  Service: General;  Laterality: Right;    Past Medical Hx:  Past Medical History  Diagnosis Date  . Overactive bladder   . GERD (gastroesophageal reflux disease)   . Goiter     CAUSING COUGH, HOARSINESS AND DRY THROAT  . Headache(784.0)     MIGRAINES - TAKES VERAPAMIL FOR PREVENTION OF MIGRAINES  . Arthritis     OA BOTH KNEES AND HANDS  . Glaucoma      left eye  . Hypertension     not on any BP meications  . Temporal arteritis     Past Gynecological History:  G2P2 NSVDx2 stillborn at 75 months Menarche 35, regular menses until 20's.  No h/o an abnormal pap.  No LMP recorded. Patient is postmenopausal. H/O secondary infertility.  Never used birth contro  Family Hx:  Family History  Problem Relation Age of Onset  . Alzheimer's disease Mother   . Heart attack Father     Vitals:  Blood pressure 121/79, pulse 90, temperature 98.1 F (36.7 C), temperature source Oral, resp. rate 20, height 5' 3.25" (1.607 m), weight 119 lb (53.978 kg).  Body mass index is 20.9 kg/(m^2).  Physical Exam: WD in NAD Neck  Supple NROM, without any enlargements.  Lymph Node Survey No cervical supraclavicular or inguinal adenopathy Cardiovascular  Pulse normal rate, regularity and rhythm. S1 and S2 normal. Cardiac monitor in place Lungs  Clear to auscultation bilaterally. Good air movement.  Skin  No rash/lesions/breakdown  Psychiatry  Alert and oriented appropriate mood affect speech and reasoning. Abdomen  Normoactive bowel sounds, abdomen soft, non-tender. Back No CVA tenderness Genito Urinary  Vulva/vagina: Normal external female genitalia.  No lesions. No discharge or bleeding.  Bladder/urethra:  No lesions or masses  Vagina: atrophic no lesions Cervix: 2cm Normal appearing, no lesions.  Uterus: Mobile, no parametrial involvement or nodularity.  Adnexa: No palpable masses. No cul de sac nodularity Rectal  Good tone, no masses no cul de sac nodularity.  Extremities  No bilateral cyanosis, clubbing or edema.   Janie Morning, MD, PhD 10/22/2013, 3:49 PM

## 2013-11-25 ENCOUNTER — Other Ambulatory Visit: Payer: Medicare Other

## 2013-11-25 ENCOUNTER — Other Ambulatory Visit (INDEPENDENT_AMBULATORY_CARE_PROVIDER_SITE_OTHER): Payer: Self-pay

## 2013-11-25 DIAGNOSIS — M316 Other giant cell arteritis: Secondary | ICD-10-CM

## 2013-11-25 DIAGNOSIS — R51 Headache: Secondary | ICD-10-CM

## 2013-11-25 DIAGNOSIS — M810 Age-related osteoporosis without current pathological fracture: Secondary | ICD-10-CM

## 2013-11-25 DIAGNOSIS — Z0289 Encounter for other administrative examinations: Secondary | ICD-10-CM

## 2013-11-25 DIAGNOSIS — R519 Headache, unspecified: Secondary | ICD-10-CM

## 2013-11-26 LAB — C-REACTIVE PROTEIN: CRP: 5.7 mg/L — ABNORMAL HIGH (ref 0.0–4.9)

## 2013-11-26 LAB — SEDIMENTATION RATE: Sed Rate: 13 mm/hr (ref 0–40)

## 2013-11-27 ENCOUNTER — Encounter: Payer: Self-pay | Admitting: Gynecologic Oncology

## 2013-11-27 ENCOUNTER — Encounter: Payer: Self-pay | Admitting: Neurology

## 2013-11-27 ENCOUNTER — Other Ambulatory Visit: Payer: Self-pay

## 2013-11-27 ENCOUNTER — Ambulatory Visit: Payer: Medicare Other | Attending: Gynecologic Oncology | Admitting: Gynecologic Oncology

## 2013-11-27 ENCOUNTER — Ambulatory Visit (INDEPENDENT_AMBULATORY_CARE_PROVIDER_SITE_OTHER): Payer: Medicare Other | Admitting: Neurology

## 2013-11-27 VITALS — BP 135/69 | HR 72 | Temp 96.6°F | Ht 63.0 in | Wt 120.5 lb

## 2013-11-27 VITALS — BP 140/64 | HR 86 | Temp 97.9°F | Resp 22 | Ht 63.0 in | Wt 121.9 lb

## 2013-11-27 DIAGNOSIS — K219 Gastro-esophageal reflux disease without esophagitis: Secondary | ICD-10-CM | POA: Diagnosis not present

## 2013-11-27 DIAGNOSIS — N83209 Unspecified ovarian cyst, unspecified side: Secondary | ICD-10-CM

## 2013-11-27 DIAGNOSIS — G43909 Migraine, unspecified, not intractable, without status migrainosus: Secondary | ICD-10-CM | POA: Diagnosis not present

## 2013-11-27 DIAGNOSIS — I10 Essential (primary) hypertension: Secondary | ICD-10-CM | POA: Diagnosis not present

## 2013-11-27 DIAGNOSIS — M810 Age-related osteoporosis without current pathological fracture: Secondary | ICD-10-CM

## 2013-11-27 DIAGNOSIS — M316 Other giant cell arteritis: Secondary | ICD-10-CM

## 2013-11-27 DIAGNOSIS — Z7952 Long term (current) use of systemic steroids: Secondary | ICD-10-CM | POA: Insufficient documentation

## 2013-11-27 DIAGNOSIS — Z7982 Long term (current) use of aspirin: Secondary | ICD-10-CM | POA: Diagnosis not present

## 2013-11-27 DIAGNOSIS — N8320 Unspecified ovarian cysts: Secondary | ICD-10-CM | POA: Diagnosis not present

## 2013-11-27 DIAGNOSIS — G43809 Other migraine, not intractable, without status migrainosus: Secondary | ICD-10-CM

## 2013-11-27 DIAGNOSIS — N83202 Unspecified ovarian cyst, left side: Secondary | ICD-10-CM

## 2013-11-27 NOTE — Progress Notes (Addendum)
[facility]    Provider:  Dr Jaynee Eagles Referring Provider: Josetta Huddle, MD Primary Care Physician:  Henrine Screws, MD  CC:  Tmporal Arteritis  HPI:  Janet Wilson is a followup for Temporal Arteritis diagnosed in May/2014 after 5 weeks of temporal headaches, fatigue, weight loss, pain with chewing. MRI of the brain was negative. Started on high dose oral prednisone with good results. Biopsy confirmed arteritis. Followed by ophthamology. She was weaned off of prednisone with recurrence of headaches and stiffnes sin the shoulders and restarted on low-dose prednisone due to increased CRP 43 and sed 15.   She is currently on 44m daily prednisone and doing well. On the 27th she has an ovarian cyst and is going to have it removed. She is feeling well. She is still getting headaches but mild and vision has been stable. Vision is good, no difficulty chewing, no migraine, gets the headaches a few times a week for a few hours in the temple areas. No muscle stiffness in the shoulder in the hips, no muscle pain.   She has osteoporosis, dexa scan pending. Understands steroids can make this worse.   Migraines improved since taking the steroids.          11/2013   9//2015  08/2013   11/2012  09/2012 07/2012  06/2012 CRP    5.7        8.2         43            1.2        4.0                     129 ESR    '13          9          15              3            7         6              31  ' Review of Systems: Patient complains of symptoms per HPI as well as the following symptoms blurred vision, cough, cramps, freq urination, bruises easy . Pertinent negatives per HPI. All others negative.   History   Social History  . Marital Status: Married    Spouse Name: GIona Beard   Number of Children: 2  . Years of Education: college   Occupational History  . retired    Social History Main Topics  . Smoking status: Never Smoker   . Smokeless tobacco: Never Used  . Alcohol Use: Yes       Comment: SELDOM  . Drug Use: No  . Sexual Activity: Yes   Other Topics Concern  . Not on file   Social History Narrative   Pt lives at home with family.   Caffeine Use: Very little.     Family History  Problem Relation Age of Onset  . Alzheimer's disease Mother   . Heart attack Father     Past Medical History  Diagnosis Date  . Overactive bladder   . GERD (gastroesophageal reflux disease)   . Goiter     CAUSING COUGH, HOARSINESS AND DRY THROAT  . Headache(784.0)     MIGRAINES - TAKES VERAPAMIL FOR PREVENTION OF MIGRAINES  . Arthritis     OA BOTH KNEES AND HANDS  . Glaucoma     left  eye  . Hypertension     not on any BP meications  . Temporal arteritis     Past Surgical History  Procedure Laterality Date  . Cervical fusion  2012  . Left shoulder surgery    . Incontinence surgery    . Right knee arthroscopy  11/2009    . Eye surgery      CATARACT EXTRACTION- BILATERAL  . Total knee arthroplasty  07/04/2011    Procedure: TOTAL KNEE ARTHROPLASTY;  Surgeon: Gearlean Alf, MD;  Location: [facility];  Service: Orthopedics;  Laterality: Right;  . Joint replacement      right knee  . Artery biopsy Right 07/21/2012    Procedure: BIOPSY TEMPORAL ARTERY;  Surgeon: Haywood Lasso, MD;  Location: [facility];  Service: General;  Laterality: Right;    Current Outpatient Prescriptions  Medication Sig Dispense Refill  . amoxicillin (AMOXIL) 875 MG tablet       . aspirin EC 81 MG tablet Take 81 mg by mouth daily.      . Biotin (BIOTIN 5000) 5 MG CAPS Take 1 capsule by mouth daily.      . brinzolamide (AZOPT) 1 % ophthalmic suspension Place 1 drop into the left eye 2 (two) times daily.      . Calcium Carbonate-Vitamin D (CALCIUM PLUS VITAMIN D PO) Take 1 tablet by mouth daily.      . Coenzyme Q10 (CO Q 10) 100 MG CAPS Take 1 tablet by mouth daily.      . diazepam (VALIUM) 5 MG tablet Take 2.5 mg by mouth at bedtime.       Marland Kitchen estradiol (ESTRACE) 0.1 MG/GM vaginal cream Place 2 g  vaginally once a week.      . fluocinonide cream (LIDEX) 0.16 % Apply 1 application topically as needed.      . magnesium oxide (MAG-OX) 400 MG tablet Take 400 mg by mouth daily with breakfast.      . nitrofurantoin, macrocrystal-monohydrate, (MACROBID) 100 MG capsule Take 100 mg by mouth 2 (two) times daily as needed (bladder infection).      . Omega-3 Fatty Acids (FISH OIL) 1200 MG CAPS Take 1 capsule by mouth daily.      Marland Kitchen omeprazole (PRILOSEC) 20 MG capsule Take 20 mg by mouth daily with breakfast.      . POTASSIUM PO Take 400 mg by mouth daily.      . prednisoLONE 5 MG TABS tablet Take 5 mg by mouth daily.      . Probiotic Product (PROBIOTIC PO) Take 1 capsule by mouth daily.      Marland Kitchen zolmitriptan (ZOMIG) 5 MG tablet Take 5 mg by mouth daily as needed for migraine. May repeat after 2 hours if headache returns.      Marland Kitchen ZOSTAVAX 01093 UNT/0.65ML injection        No current facility-administered medications for this visit.    Allergies as of 11/27/2013 - Review Complete 11/27/2013  Allergen Reaction Noted  . Oxycodone Anaphylaxis 06/18/2012  . Codeine Nausea Only 07/11/2012    Vitals: BP 135/69  Pulse 72  Temp(Src) 96.6 F (35.9 C) (Oral)  Ht '5\' 3"'  (1.6 m)  Wt 120 lb 8 oz (54.658 kg)  BMI 21.35 kg/m2 Last Weight:  Wt Readings from Last 1 Encounters:  11/27/13 121 lb 14.4 oz (55.293 kg)   Last Height:   Ht Readings from Last 1 Encounters:  11/27/13 '5\' 3"'  (1.6 m)    Physical exam: Exam: Gen: NAD, conversant, well  nourised, well groomed                     CV: RRR, no MRG. No Carotid Bruits. No peripheral edema, warm, nontender Eyes: Conjunctivae clear without exudates or hemorrhage Temporal pulses right > left  Neuro: Detailed Neurologic Exam  Speech:    Speech is normal; fluent and spontaneous with normal comprehension.  Cognition:    The patient is oriented to person, place, and time;     recent and remote memory intact;     language fluent;     normal attention,  concentration,     fund of knowledge Cranial Nerves:    The pupils are equal, round, and reactive to light. 20/40 rght, 20/30 left corrected.  The fundi are normal and spontaneous venous pulsations are present. Visual fields are full to finger confrontation. Extraocular movements are intact. Trigeminal sensation is intact and the muscles of mastication are normal. The face is symmetric. The palate elevates in the midline. Voice is normal. Shoulder shrug is normal. The tongue has normal motion without fasciculations.   Coordination:    Normal finger to nose and heel to shin. Normal rapid alternating movements.   Gait:    Heel-toe and tandem gait are normal.   Motor Observation:    No asymmetry, no atrophy, and no involuntary movements noted. Tone:    Normal muscle tone.    Posture:    Posture is normal. normal erect    Strength:    Strength is V/V in the upper and lower limbs.      Sensation: intact to LT     Reflex Exam:  DTR's:    Deep tendon reflexes in the upper and lower extremities are normal bilaterally.   Toes:    The toes are downgoing bilaterally.   Clonus:    Clonus is absent.   Assessment/Plan:  79 y.o. female here for follow up of right-sided temporal arteritis. Elevated CRP and positive biopsy. Had responded well to prednisone, but relapsed when prednisone was discontinued. Possibly showing signs of overlapping polymyalgia rheumatica, with bilateral hip and shoulder pain. Unfortunately she has oteoporosis and possible complications such as AVN. May consider switching to alternative steroid-sparing treatment in the future, consider Prednisone for now. Will return after surgery and can consider decreasing steroids. Fall precautions.    Sarina Ill, MD  [facility] [address] [place],  [address]  Phone [phone] Fax [phone] Lenor Coffin

## 2013-11-27 NOTE — Patient Instructions (Signed)
Preparing for your Surgery  Plan for surgery on October 27 with Dr. Denman George at [facility].  Pre-operative [place] will receive a phone call from presurgical testing at [facility] to arrange for a pre-operative testing appointment before your surgery. This appointment normally occurs one to two weeks before your scheduled surgery.  -Bring your insurance card, copy of an advanced directive if applicable, medication list  -At that visit, you will be asked to sign a consent for a possible blood transfusion in case a transfusion becomes necessary during surgery. The need for a blood transfusion is rare but having consent is a necessary part of your care.  -You should not be taking blood thinners or aspirin at least ten days prior to surgery unless instructed by your surgeon.  Day Before Surgery at [place] will be asked to take in only clear liquids the day before surgery. Examples of clear liquids include broths, jello, and clear juices. You will be advised to have nothing to eat or drink after midnight the evening before.  Your role in recovery  Your role is to become active as soon as directed by your doctor, while still giving yourself time to heal. Rest when you feel tired. You will be asked to do the following in order to speed your recovery:  - Cough and breathe deeply. This helps toclear and expand your lungs and can prevent pneumonia. You may be given a spirometer to practice deep breathing. A staff member will show you how to use the spirometer.  - Do mild physical activity. Walking or moving your legs help your circulation and body functions return to normal. A staff member will help you when you try to walk and will provide you with simple exercises. Do not try to get up or walk alone the first time.  - Actively manage your pain. Managing your pain lets you move in comfort. We will ask you to rate your pain on a scale of zero to 10. It is your responsibility to tell your  doctor or nurse where and how much you hurt so your pain can be treated.  Special Considerations  -If you are diabetic, you may be placed on insulin after surgery to have closer control over your blood sugars to promote healing and recovery. This does not mean that you will be discharged on insulin. If applicable, your oral antidiabetics will be resumed when you are tolerating a solid diet.  -Your final pathology results from surgery should be available by the Friday after surgery and the results will be relayed to you when available.

## 2013-11-27 NOTE — Progress Notes (Signed)
Preoperative Note: Gyn-Onc  Original consult was requested by Dr. Carren Rang for the evaluation of Janet Wilson 79 y.o. female  CC:  Chief Complaint  Patient presents with  . Ovarian Cyst    Follow up    Assessment/Plan:  [name]  is a 79 y.o.  with a complex left adnexal mass measuring 15 x 8 mm with a 7 mm solid area blood flow. CA 125 within normal limits. Our recommendation is for a robotic assisted laparoscopic bilateral salpingo-oophorectomy with staging and other indicated procedures determined by the results of frozen section. Her temporal arteritis has been evaluated (today) and deemed to be stable. Her dose of steroids is dropping and she is at 5mg  q day. The patient and her husband are aware that the procedure may need to be converted to an open procedure and that decision will be dependent upon the surgical findings. The risks of the procedure shared were inclusive of infection bleeding damage to surrounding structures prolonged hospitalization and reoperation. All of their questions were answered to her satisfaction. The plan is for the procedure to occur on 12/08/2013.   HPI: [name]  is a 79 y.o.   G2P2 LNMP in herr 40's.  [name] reports the presence of more abdominal pain predominantly in the left for approximately one year. States the pain occasionally radiates to the back. The workup has included findings of arthritis and diverticulosis however an ultrasound on 08/25/2048 was notable for presence of a 10 by 44mm left ovarian cysts with a 7 mm solid area. Blood flow was seen within the solid area no free fluid was noted. A repeat scan in September of 2015 noted the same left ovarian cyst it now measures 15 x 8 mm. Septations were appreciated with internal echoes in the same 7 mm solid area. A CA 125 returned to value of 5.  Her history is notable for right temporal arteritis requiring chronic steroid use for approximately 1-1/2  years. The highest dose was 60 mg of prednisone a day. She's currently at 5 mg daily, and her sed rate is currently falling.  Review of Systems:  Constitutional  Feels well, Cardiovascular  No chest pain, shortness of breath, or edema  Pulmonary  No cough or wheeze.  Gastro Intestinal  No nausea, vomitting, or diarrhoea. No bright red blood per rectum, no abdominal pain, change in bowel movement, or constipation. No early satiety, no abdominal bloating. Genito Urinary  No frequency, urgency, dysuria,no vaginal discharge or bleeding Musculo Skeletal  No myalgia, arthralgia, joint swelling or pain  Neurologic  No weakness, numbness, change in gait,  Psychology  No depression, anxiety, insomnia.    Current Meds:  Outpatient Encounter Prescriptions as of 11/27/2013  Medication Sig  . amoxicillin (AMOXIL) 875 MG tablet   . aspirin EC 81 MG tablet Take 81 mg by mouth daily.  . Biotin (BIOTIN 5000) 5 MG CAPS Take 1 capsule by mouth daily.  . brinzolamide (AZOPT) 1 % ophthalmic suspension Place 1 drop into the left eye 2 (two) times daily.  . Calcium Carbonate-Vitamin D (CALCIUM PLUS VITAMIN D PO) Take 1 tablet by mouth daily.  . Coenzyme Q10 (CO Q 10) 100 MG CAPS Take 1 tablet by mouth daily.  . diazepam (VALIUM) 5 MG tablet Take 2.5 mg by mouth at bedtime.   Marland Kitchen estradiol (ESTRACE) 0.1 MG/GM vaginal cream Place 2 g vaginally once a week.  . fluocinonide cream (LIDEX) 0.05 % Apply 1  application topically as needed.  . magnesium oxide (MAG-OX) 400 MG tablet Take 400 mg by mouth daily with breakfast.  . nitrofurantoin, macrocrystal-monohydrate, (MACROBID) 100 MG capsule Take 100 mg by mouth 2 (two) times daily as needed (bladder infection).  . Omega-3 Fatty Acids (FISH OIL) 1200 MG CAPS Take 1 capsule by mouth daily.  Marland Kitchen omeprazole (PRILOSEC) 20 MG capsule Take 20 mg by mouth daily with breakfast.  . POTASSIUM PO Take 400 mg by mouth daily.  . prednisoLONE 5 MG TABS tablet Take 5 mg by  mouth daily.  . Probiotic Product (PROBIOTIC PO) Take 1 capsule by mouth daily.  Marland Kitchen zolmitriptan (ZOMIG) 5 MG tablet Take 5 mg by mouth daily as needed for migraine. May repeat after 2 hours if headache returns.  Marland Kitchen ZOSTAVAX 44034 UNT/0.65ML injection     Allergy:  Allergies  Allergen Reactions  . Oxycodone Anaphylaxis  . Codeine Nausea Only    Social Hx:   History   Social History  . Marital Status: Married    Spouse Name: [name]    Number of Children: 2  . Years of Education: college   Occupational History  . retired    Social History Main Topics  . Smoking status: Never Smoker   . Smokeless tobacco: Never Used  . Alcohol Use: Yes     Comment: SELDOM  . Drug Use: No  . Sexual Activity: Yes   Other Topics Concern  . Not on file   Social History Narrative   Pt lives at home with family.   Caffeine Use: Very little.     Past Surgical Hx:  Past Surgical History  Procedure Laterality Date  . Cervical fusion  2012  . Left shoulder surgery    . Incontinence surgery    . Right knee arthroscopy  11/2009    . Eye surgery      CATARACT EXTRACTION- BILATERAL  . Total knee arthroplasty  07/04/2011    Procedure: TOTAL KNEE ARTHROPLASTY;  Surgeon: Gearlean Alf, MD;  Location: [facility];  Service: Orthopedics;  Laterality: Right;  . Joint replacement      right knee  . Artery biopsy Right 07/21/2012    Procedure: BIOPSY TEMPORAL ARTERY;  Surgeon: Haywood Lasso, MD;  Location: [facility];  Service: General;  Laterality: Right;    Past Medical Hx:  Past Medical History  Diagnosis Date  . Overactive bladder   . GERD (gastroesophageal reflux disease)   . Goiter     CAUSING COUGH, HOARSINESS AND DRY THROAT  . Headache(784.0)     MIGRAINES - TAKES VERAPAMIL FOR PREVENTION OF MIGRAINES  . Arthritis     OA BOTH KNEES AND HANDS  . Glaucoma     left eye  . Hypertension     not on any BP meications  . Temporal arteritis     Past Gynecological History:  G2P2 NSVDx2  stillborn at 41 months Menarche 82, regular menses until 48's.  No h/o an abnormal pap.  No LMP recorded. Patient is postmenopausal. H/O secondary infertility.  Never used birth contro  Family Hx:  Family History  Problem Relation Age of Onset  . Alzheimer's disease Mother   . Heart attack Father     Vitals:  Blood pressure 140/64, pulse 86, temperature 97.9 F (36.6 C), temperature source Oral, resp. rate 22, height 5\' 3"  (1.6 m), weight 121 lb 14.4 oz (55.293 kg). Body mass index is 21.6 kg/(m^2).  Physical Exam: WD in NAD Neck  Supple  NROM, without any enlargements.  Lymph Node Survey No cervical supraclavicular or inguinal adenopathy Cardiovascular  Pulse normal rate, regularity and rhythm. S1 and S2 normal. Cardiac monitor in place Lungs  Clear to auscultation bilaterally. Good air movement.  Skin  No rash/lesions/breakdown  Psychiatry  Alert and oriented appropriate mood affect speech and reasoning. Abdomen  Normoactive bowel sounds, abdomen soft, non-tender. Back No CVA tenderness Genito Urinary  Vulva/vagina: Normal external female genitalia.  No lesions. No discharge or bleeding.  Bladder/urethra:  No lesions or masses  Vagina: atrophic no lesions Cervix: 2cm Normal appearing, no lesions.  Uterus: Mobile, no parametrial involvement or nodularity.  Adnexa: No palpable masses. No cul de sac nodularity Rectal  Good tone, no masses no cul de sac nodularity.  Extremities  No bilateral cyanosis, clubbing or edema.   Donaciano Eva, MD, PhD 11/27/2013, 5:13 PM

## 2013-11-29 ENCOUNTER — Encounter: Payer: Self-pay | Admitting: Neurology

## 2013-12-01 ENCOUNTER — Encounter (HOSPITAL_COMMUNITY): Payer: Self-pay

## 2013-12-01 ENCOUNTER — Encounter: Payer: Self-pay | Admitting: Neurology

## 2013-12-01 ENCOUNTER — Ambulatory Visit (HOSPITAL_COMMUNITY)
Admission: RE | Admit: 2013-12-01 | Discharge: 2013-12-01 | Disposition: A | Discharge status: Home / Self Care | Payer: Medicare Other | Source: Ambulatory Visit | Attending: Gynecologic Oncology | Admitting: Gynecologic Oncology

## 2013-12-01 ENCOUNTER — Encounter (HOSPITAL_COMMUNITY)
Admission: RE | Admit: 2013-12-01 | Discharge: 2013-12-01 | Disposition: A | Discharge status: Home / Self Care | Payer: Medicare Other | Source: Ambulatory Visit | Attending: Gynecologic Oncology | Admitting: Gynecologic Oncology

## 2013-12-01 ENCOUNTER — Encounter (HOSPITAL_COMMUNITY): Payer: Self-pay | Admitting: Pharmacy Technician

## 2013-12-01 DIAGNOSIS — N8320 Unspecified ovarian cysts: Secondary | ICD-10-CM | POA: Insufficient documentation

## 2013-12-01 DIAGNOSIS — Z8679 Personal history of other diseases of the circulatory system: Secondary | ICD-10-CM

## 2013-12-01 DIAGNOSIS — Z01818 Encounter for other preprocedural examination: Secondary | ICD-10-CM | POA: Diagnosis not present

## 2013-12-01 HISTORY — DX: Supraventricular tachycardia: I47.1

## 2013-12-01 HISTORY — DX: Other supraventricular tachycardia: I47.19

## 2013-12-01 LAB — CBC WITH DIFFERENTIAL/PLATELET
Basophils Absolute: 0 10*3/uL (ref 0.0–0.1)
Basophils Relative: 0 % (ref 0–1)
Eosinophils Absolute: 0.1 10*3/uL (ref 0.0–0.7)
Eosinophils Relative: 2 % (ref 0–5)
HCT: 43.4 % (ref 36.0–46.0)
Hemoglobin: 13.8 g/dL (ref 12.0–15.0)
Lymphocytes Relative: 17 % (ref 12–46)
Lymphs Abs: 1.1 10*3/uL (ref 0.7–4.0)
MCH: 29.4 pg (ref 26.0–34.0)
MCHC: 31.8 g/dL (ref 30.0–36.0)
MCV: 92.3 fL (ref 78.0–100.0)
Monocytes Absolute: 0.5 10*3/uL (ref 0.1–1.0)
Monocytes Relative: 8 % (ref 3–12)
Neutro Abs: 4.9 10*3/uL (ref 1.7–7.7)
Neutrophils Relative %: 73 % (ref 43–77)
Platelets: 201 10*3/uL (ref 150–400)
RBC: 4.7 MIL/uL (ref 3.87–5.11)
RDW: 14.8 % (ref 11.5–15.5)
WBC: 6.7 10*3/uL (ref 4.0–10.5)

## 2013-12-01 LAB — COMPREHENSIVE METABOLIC PANEL
ALT: 13 U/L (ref 0–35)
AST: 18 U/L (ref 0–37)
Albumin: 3.5 g/dL (ref 3.5–5.2)
Alkaline Phosphatase: 69 U/L (ref 39–117)
Anion gap: 9 (ref 5–15)
BUN: 19 mg/dL (ref 6–23)
CO2: 30 mEq/L (ref 19–32)
Calcium: 9.7 mg/dL (ref 8.4–10.5)
Chloride: 103 mEq/L (ref 96–112)
Creatinine, Ser: 0.93 mg/dL (ref 0.50–1.10)
GFR calc Af Amer: 67 mL/min — ABNORMAL LOW (ref >90)
GFR calc non Af Amer: 58 mL/min — ABNORMAL LOW (ref >90)
Glucose, Bld: 79 mg/dL (ref 70–99)
Potassium: 4.5 mEq/L (ref 3.7–5.3)
Sodium: 142 mEq/L (ref 137–147)
Total Bilirubin: 0.4 mg/dL (ref 0.3–1.2)
Total Protein: 7 g/dL (ref 6.0–8.3)

## 2013-12-01 LAB — URINALYSIS, ROUTINE W REFLEX MICROSCOPIC
Bilirubin Urine: NEGATIVE
Glucose, UA: NEGATIVE mg/dL
Hgb urine dipstick: NEGATIVE
Ketones, ur: NEGATIVE mg/dL
Leukocytes, UA: NEGATIVE
Nitrite: NEGATIVE
Protein, ur: NEGATIVE mg/dL
Specific Gravity, Urine: 1.018 (ref 1.005–1.030)
Urobilinogen, UA: 0.2 mg/dL (ref 0.0–1.0)
pH: 7 (ref 5.0–8.0)

## 2013-12-01 NOTE — Patient Instructions (Addendum)
CLEAR LIQUID DIET ALL DAY - THE DAY BEFORE YOUR SURGERY    YOUR SURGERY IS SCHEDULED AT [facility]  ON:  Tuesday  10/27  REPORT TO  SHORT STAY CENTER AT:  8:45 AM   DO NOT EAT OR DRINK ANYTHING AFTER MIDNIGHT THE NIGHT BEFORE YOUR SURGERY.  YOU MAY BRUSH YOUR TEETH, RINSE OUT YOUR MOUTH--BUT NO WATER, NO FOOD, NO CHEWING GUM, NO MINTS, NO CANDIES, NO CHEWING TOBACCO.  PLEASE TAKE THE FOLLOWING MEDICATIONS THE AM OF YOUR SURGERY WITH A FEW SIPS OF WATER:  PREDNISOLONE.  USE YOUR EYE DROPS    DO NOT BRING VALUABLES, MONEY, CREDIT CARDS.  DO NOT WEAR JEWELRY, MAKE-UP, NAIL POLISH AND NO METAL PINS OR CLIPS IN YOUR HAIR. CONTACT LENS, DENTURES / PARTIALS, GLASSES SHOULD NOT BE WORN TO SURGERY AND IN MOST CASES-HEARING AIDS WILL NEED TO BE REMOVED.  BRING YOUR GLASSES CASE, ANY EQUIPMENT NEEDED FOR YOUR CONTACT LENS. FOR PATIENTS ADMITTED TO THE HOSPITAL--CHECK OUT TIME THE DAY OF DISCHARGE IS 11:00 AM.  ALL INPATIENT ROOMS ARE PRIVATE - WITH BATHROOM, TELEPHONE, TELEVISION AND WIFI INTERNET.  IF YOU ARE BEING DISCHARGED THE SAME DAY OF YOUR SURGERY--YOU CAN NOT DRIVE YOURSELF HOME--AND SHOULD NOT GO HOME ALONE BY TAXI OR BUS.  NO DRIVING OR OPERATING MACHINERY, OR MAKING LEGAL DECISIONS FOR 24 HOURS FOLLOWING ANESTHESIA / PAIN MEDICATIONS.  PLEASE MAKE ARRANGEMENTS FOR SOMEONE TO BE WITH YOU AT HOME THE FIRST 24 HOURS AFTER SURGERY. RESPONSIBLE DRIVER'S NAME / PHONE                                                     PLEASE BE AWARE THAT YOU MAY NEED ADDITIONAL BLOOD DRAWN DAY OF YOUR SURGERY  AFTER YOUR SURGERY - REMEMBER TO DO DEEP BREATHING, COUGHING, TURNING AND LEG EXERCISES  _______________________________________________________________________   [facility] - Preparing for Surgery Before surgery, you can play an important role.  Because skin is not sterile, your skin needs to be as free of germs as possible.  You can reduce the number of germs on your skin by washing  with CHG (chlorahexidine gluconate) soap before surgery.  CHG is an antiseptic cleaner which kills germs and bonds with the skin to continue killing germs even after washing. Please DO NOT use if you have an allergy to CHG or antibacterial soaps.  If your skin becomes reddened/irritated stop using the CHG and inform your nurse when you arrive at Short Stay. Do not shave (including legs and underarms) for at least 48 hours prior to the first CHG shower.  You may shave your face/neck. Please follow these instructions carefully:  1.  Shower with CHG Soap the night before surgery and the  morning of Surgery.  2.  If you choose to wash your hair, wash your hair first as usual with your  normal  shampoo.  3.  After you shampoo, rinse your hair and body thoroughly to remove the  shampoo.                           4.  Use CHG as you would any other liquid soap.  You can apply chg directly  to the skin and wash  Gently with a scrungie or clean washcloth.  5.  Apply the CHG Soap to your body ONLY FROM THE NECK DOWN.   Do not use on face/ open                           Wound or open sores. Avoid contact with eyes, ears mouth and genitals (private parts).                       Wash face,  Genitals (private parts) with your normal soap.             6.  Wash thoroughly, paying special attention to the area where your surgery  will be performed.  7.  Thoroughly rinse your body with warm water from the neck down.  8.  DO NOT shower/wash with your normal soap after using and rinsing off  the CHG Soap.                9.  Pat yourself dry with a clean towel.            10.  Wear clean pajamas.            11.  Place clean sheets on your bed the night of your first shower and do not  sleep with pets. Day of Surgery : Do not apply any lotions/deodorants the morning of surgery.  Please wear clean clothes to the hospital/surgery center.  FAILURE TO FOLLOW THESE INSTRUCTIONS MAY RESULT IN THE  CANCELLATION OF YOUR SURGERY PATIENT SIGNATURE_________________________________  NURSE SIGNATURE__________________________________  ________________________________________________________________________   Janet Wilson  An incentive spirometer is a tool that can help keep your lungs clear and active. This tool measures how well you are filling your lungs with each breath. Taking long deep breaths may help reverse or decrease the chance of developing breathing (pulmonary) problems (especially infection) following:  A long period of time when you are unable to move or be active. BEFORE THE PROCEDURE   If the spirometer includes an indicator to show your best effort, your nurse or respiratory therapist will set it to a desired goal.  If possible, sit up straight or lean slightly forward. Try not to slouch.  Hold the incentive spirometer in an upright position. INSTRUCTIONS FOR USE  1. Sit on the edge of your bed if possible, or sit up as far as you can in bed or on a chair. 2. Hold the incentive spirometer in an upright position. 3. Breathe out normally. 4. Place the mouthpiece in your mouth and seal your lips tightly around it. 5. Breathe in slowly and as deeply as possible, raising the piston or the ball toward the top of the column. 6. Hold your breath for 3-5 seconds or for as long as possible. Allow the piston or ball to fall to the bottom of the column. 7. Remove the mouthpiece from your mouth and breathe out normally. 8. Rest for a few seconds and repeat Steps 1 through 7 at least 10 times every 1-2 hours when you are awake. Take your time and take a few normal breaths between deep breaths. 9. The spirometer may include an indicator to show your best effort. Use the indicator as a goal to work toward during each repetition. 10. After each set of 10 deep breaths, practice coughing to be sure your lungs are clear. If you have an incision (the cut made at the time of surgery),  support your incision when coughing by placing a pillow or rolled up towels firmly against it. Once you are able to get out of bed, walk around indoors and cough well. You may stop using the incentive spirometer when instructed by your caregiver.  RISKS AND COMPLICATIONS  Take your time so you do not get dizzy or light-headed.  If you are in pain, you may need to take or ask for pain medication before doing incentive spirometry. It is harder to take a deep breath if you are having pain. AFTER USE  Rest and breathe slowly and easily.  It can be helpful to keep track of a log of your progress. Your caregiver can provide you with a simple table to help with this. If you are using the spirometer at home, follow these instructions: [place] IF:   You are having difficultly using the spirometer.  You have trouble using the spirometer as often as instructed.  Your pain medication is not giving enough relief while using the spirometer.  You develop fever of 100.5 F (38.1 C) or higher. SEEK IMMEDIATE MEDICAL CARE IF:   You cough up bloody sputum that had not been present before.  You develop fever of 102 F (38.9 C) or greater.  You develop worsening pain at or near the incision site. MAKE SURE YOU:   Understand these instructions.  Will watch your condition.  Will get help right away if you are not doing well or get worse. Document Released: 06/11/2006 Document Revised: 04/23/2011 Document Reviewed: 08/12/2006 ExitCare Patient Information 2014 ExitCare, [place].   ________________________________________________________________________  WHAT IS A BLOOD TRANSFUSION? Blood Transfusion Information  A transfusion is the replacement of blood or some of its parts. Blood is made up of multiple cells which provide different functions.  Red blood cells carry oxygen and are used for blood loss replacement.  White blood cells fight against infection.  Platelets control  bleeding.  Plasma helps clot blood.  Other blood products are available for specialized needs, such as hemophilia or other clotting disorders. BEFORE THE TRANSFUSION  Who gives blood for transfusions?   Healthy volunteers who are fully evaluated to make sure their blood is safe. This is blood bank blood. Transfusion therapy is the safest it has ever been in the practice of medicine. Before blood is taken from a donor, a complete history is taken to make sure that person has no history of diseases nor engages in risky social behavior (examples are intravenous drug use or sexual activity with multiple partners). The donor's travel history is screened to minimize risk of transmitting infections, such as malaria. The donated blood is tested for signs of infectious diseases, such as HIV and hepatitis. The blood is then tested to be sure it is compatible with you in order to minimize the chance of a transfusion reaction. If you or a relative donates blood, this is often done in anticipation of surgery and is not appropriate for emergency situations. It takes many days to process the donated blood. RISKS AND COMPLICATIONS Although transfusion therapy is very safe and saves many lives, the main dangers of transfusion include:   Getting an infectious disease.  Developing a transfusion reaction. This is an allergic reaction to something in the blood you were given. Every precaution is taken to prevent this. The decision to have a blood transfusion has been considered carefully by your caregiver before blood is given. Blood is not given unless the benefits outweigh the risks. AFTER THE TRANSFUSION  Right after receiving a blood transfusion, you will usually feel much better and more energetic. This is especially true if your red blood cells have gotten low (anemic). The transfusion raises the level of the red blood cells which carry oxygen, and this usually causes an energy increase.  The nurse  administering the transfusion will monitor you carefully for complications. HOME CARE INSTRUCTIONS  No special instructions are needed after a transfusion. You may find your energy is better. Speak with your caregiver about any limitations on activity for underlying diseases you may have. SEEK MEDICAL CARE IF:   Your condition is not improving after your transfusion.  You develop redness or irritation at the intravenous (IV) site. SEEK IMMEDIATE MEDICAL CARE IF:  Any of the following symptoms occur over the next 12 hours:  Shaking chills.  You have a temperature by mouth above 102 F (38.9 C), not controlled by medicine.  Chest, back, or muscle pain.  People around you feel you are not acting correctly or are confused.  Shortness of breath or difficulty breathing.  Dizziness and fainting.  You get a rash or develop hives.  You have a decrease in urine output.  Your urine turns a dark color or changes to pink, red, or brown. Any of the following symptoms occur over the next 10 days:  You have a temperature by mouth above 102 F (38.9 C), not controlled by medicine.  Shortness of breath.  Weakness after normal activity.  The white part of the eye turns yellow (jaundice).  You have a decrease in the amount of urine or are urinating less often.  Your urine turns a dark color or changes to pink, red, or brown. Document Released: 01/27/2000 Document Revised: 04/23/2011 Document Reviewed: 09/15/2007 ExitCare Patient Information 2014 ExitCare, [place].  _______________________________________________________________________    CLEAR LIQUID DIET   Foods Allowed                                                                     Foods Excluded  Coffee and tea, regular and decaf                             liquids that you cannot  Plain Jell-O in any flavor                                             see through such as: Fruit ices (not with fruit pulp)                                      milk, soups, orange juice  Iced Popsicles                                    All solid food Carbonated beverages, regular and diet  Cranberry, grape and apple juices Sports drinks like Gatorade Lightly seasoned clear broth or consume(fat free) Sugar, honey syrup  Sample Menu Breakfast                                Lunch                                     Supper Cranberry juice                    Beef broth                            Chicken broth Jell-O                                     Grape juice                           Apple juice Coffee or tea                        Jell-O                                      Popsicle                                                Coffee or tea                        Coffee or tea  _____________________________________________________________________

## 2013-12-01 NOTE — Pre-Procedure Instructions (Signed)
EKG REPORT AND CARDIOLOGY OFFICE NOTE [id number] ON CHART FROM [facility]. CXR WAS DONE TODAY PREOP AT [facility]

## 2013-12-07 ENCOUNTER — Telehealth: Payer: Self-pay | Admitting: *Deleted

## 2013-12-07 NOTE — Telephone Encounter (Signed)
Call patient to check-in and see if she has any questions before her surgery scheduled for tomorrow. Patient states the only question she has is wondering if she needs to bring HCPOA papers with her. Told patient if she has them in place to bring them when she checks in and they can make a copy and scan them into her chart. Pt agreeable to this.  Patient complaint with pre-operative instructions.  Reinforced NPO after midnight. No questions or concerns voiced. Told patient to call with any further questions.

## 2013-12-08 ENCOUNTER — Encounter (HOSPITAL_COMMUNITY): Payer: Medicare Other | Admitting: Anesthesiology

## 2013-12-08 ENCOUNTER — Ambulatory Visit (HOSPITAL_COMMUNITY)
Admission: RE | Admit: 2013-12-08 | Discharge: 2013-12-08 | Disposition: A | Discharge status: Home / Self Care | Payer: Medicare Other | Source: Ambulatory Visit | Attending: Gynecologic Oncology | Admitting: Gynecologic Oncology

## 2013-12-08 ENCOUNTER — Encounter (HOSPITAL_COMMUNITY): Payer: Self-pay | Admitting: *Deleted

## 2013-12-08 ENCOUNTER — Ambulatory Visit (HOSPITAL_COMMUNITY): Payer: Medicare Other | Admitting: Anesthesiology

## 2013-12-08 ENCOUNTER — Ambulatory Visit: Payer: Medicare Other | Admitting: Neurology

## 2013-12-08 ENCOUNTER — Encounter (HOSPITAL_COMMUNITY)
Admission: RE | Disposition: A | Discharge status: Home / Self Care | Payer: Self-pay | Source: Ambulatory Visit | Attending: Gynecologic Oncology

## 2013-12-08 DIAGNOSIS — N3281 Overactive bladder: Secondary | ICD-10-CM | POA: Insufficient documentation

## 2013-12-08 DIAGNOSIS — G43909 Migraine, unspecified, not intractable, without status migrainosus: Secondary | ICD-10-CM | POA: Insufficient documentation

## 2013-12-08 DIAGNOSIS — M316 Other giant cell arteritis: Secondary | ICD-10-CM | POA: Insufficient documentation

## 2013-12-08 DIAGNOSIS — H409 Unspecified glaucoma: Secondary | ICD-10-CM | POA: Insufficient documentation

## 2013-12-08 DIAGNOSIS — D270 Benign neoplasm of right ovary: Secondary | ICD-10-CM

## 2013-12-08 DIAGNOSIS — I10 Essential (primary) hypertension: Secondary | ICD-10-CM | POA: Insufficient documentation

## 2013-12-08 DIAGNOSIS — M158 Other polyosteoarthritis: Secondary | ICD-10-CM | POA: Diagnosis not present

## 2013-12-08 DIAGNOSIS — N838 Other noninflammatory disorders of ovary, fallopian tube and broad ligament: Secondary | ICD-10-CM | POA: Diagnosis present

## 2013-12-08 DIAGNOSIS — K219 Gastro-esophageal reflux disease without esophagitis: Secondary | ICD-10-CM | POA: Insufficient documentation

## 2013-12-08 DIAGNOSIS — D271 Benign neoplasm of left ovary: Secondary | ICD-10-CM | POA: Diagnosis not present

## 2013-12-08 DIAGNOSIS — N83202 Unspecified ovarian cyst, left side: Secondary | ICD-10-CM | POA: Diagnosis present

## 2013-12-08 HISTORY — PX: ROBOTIC ASSISTED BILATERAL SALPINGO OOPHERECTOMY: SHX6078

## 2013-12-08 LAB — TYPE AND SCREEN
ABO/RH(D): O POS
Antibody Screen: NEGATIVE

## 2013-12-08 SURGERY — ROBOTIC ASSISTED BILATERAL SALPINGO OOPHORECTOMY
Anesthesia: General | Site: Pelvis | Laterality: Bilateral

## 2013-12-08 MED ORDER — ROCURONIUM BROMIDE 100 MG/10ML IV SOLN
INTRAVENOUS | Status: DC | PRN
  Administered 2013-12-08: 40 mg via INTRAVENOUS

## 2013-12-08 MED ORDER — ONDANSETRON HCL 4 MG/2ML IJ SOLN
INTRAMUSCULAR | Status: AC
  Filled 2013-12-08: qty 2

## 2013-12-08 MED ORDER — NEOSTIGMINE METHYLSULFATE 10 MG/10ML IV SOLN
INTRAVENOUS | Status: DC | PRN
  Administered 2013-12-08: 3 mg via INTRAVENOUS

## 2013-12-08 MED ORDER — FENTANYL CITRATE 0.05 MG/ML IJ SOLN
INTRAMUSCULAR | Status: DC | PRN
  Administered 2013-12-08: 50 ug via INTRAVENOUS

## 2013-12-08 MED ORDER — HYDROMORPHONE HCL 2 MG PO TABS: 2.0000 mg | ORAL_TABLET | Freq: Four times a day (QID) | ORAL | Status: DC | PRN

## 2013-12-08 MED ORDER — HYDROMORPHONE HCL 2 MG PO TABS: 2.0000 mg | ORAL_TABLET | ORAL | Status: DC | PRN

## 2013-12-08 MED ORDER — GLYCOPYRROLATE 0.2 MG/ML IJ SOLN
INTRAMUSCULAR | Status: AC
  Filled 2013-12-08: qty 2

## 2013-12-08 MED ORDER — HYDROMORPHONE HCL 1 MG/ML IJ SOLN
INTRAMUSCULAR | Status: AC
  Filled 2013-12-08: qty 1

## 2013-12-08 MED ORDER — PHENYLEPHRINE 40 MCG/ML (10ML) SYRINGE FOR IV PUSH (FOR BLOOD PRESSURE SUPPORT)
PREFILLED_SYRINGE | INTRAVENOUS | Status: AC
  Filled 2013-12-08: qty 10

## 2013-12-08 MED ORDER — PHENYLEPHRINE HCL 10 MG/ML IJ SOLN
INTRAMUSCULAR | Status: DC | PRN
  Administered 2013-12-08 (×2): 80 ug via INTRAVENOUS

## 2013-12-08 MED ORDER — ONDANSETRON HCL 4 MG/2ML IJ SOLN
INTRAMUSCULAR | Status: DC | PRN
Start: 2013-12-08 — End: 2013-12-08
  Administered 2013-12-08: 4 mg via INTRAVENOUS

## 2013-12-08 MED ORDER — LACTATED RINGERS IV SOLN
INTRAVENOUS | Status: DC
  Administered 2013-12-08: 1000 mL via INTRAVENOUS
  Administered 2013-12-08: 12:00:00 via INTRAVENOUS

## 2013-12-08 MED ORDER — SUCCINYLCHOLINE CHLORIDE 20 MG/ML IJ SOLN
INTRAMUSCULAR | Status: DC | PRN
  Administered 2013-12-08: 100 mg via INTRAVENOUS

## 2013-12-08 MED ORDER — DEXAMETHASONE SODIUM PHOSPHATE 10 MG/ML IJ SOLN
INTRAMUSCULAR | Status: AC
  Filled 2013-12-08: qty 1

## 2013-12-08 MED ORDER — SODIUM CHLORIDE 0.9 % IV SOLN
250.0000 mL | INTRAVENOUS | Status: DC | PRN
Start: 2013-12-08 — End: 2013-12-08

## 2013-12-08 MED ORDER — FENTANYL CITRATE 0.05 MG/ML IJ SOLN
INTRAMUSCULAR | Status: AC
  Filled 2013-12-08: qty 5

## 2013-12-08 MED ORDER — HYDROMORPHONE HCL 1 MG/ML IJ SOLN
0.2500 mg | INTRAMUSCULAR | Status: DC | PRN
  Administered 2013-12-08: 0.5 mg via INTRAVENOUS

## 2013-12-08 MED ORDER — CEFAZOLIN SODIUM-DEXTROSE 2-3 GM-% IV SOLR
INTRAVENOUS | Status: AC
  Filled 2013-12-08: qty 50

## 2013-12-08 MED ORDER — SODIUM CHLORIDE 0.9 % IJ SOLN: 3.0000 mL | INTRAMUSCULAR | Status: DC | PRN

## 2013-12-08 MED ORDER — PROMETHAZINE HCL 25 MG/ML IJ SOLN
6.2500 mg | INTRAMUSCULAR | Status: DC | PRN
  Administered 2013-12-08: 12.5 mg via INTRAVENOUS

## 2013-12-08 MED ORDER — LIDOCAINE HCL (CARDIAC) 20 MG/ML IV SOLN
INTRAVENOUS | Status: AC
  Filled 2013-12-08: qty 5

## 2013-12-08 MED ORDER — PROMETHAZINE HCL 25 MG/ML IJ SOLN
INTRAMUSCULAR | Status: AC
  Filled 2013-12-08: qty 1

## 2013-12-08 MED ORDER — ACETAMINOPHEN 325 MG PO TABS
650.0000 mg | ORAL_TABLET | ORAL | Status: DC | PRN
  Administered 2013-12-08: 650 mg via ORAL
  Filled 2013-12-08: qty 2

## 2013-12-08 MED ORDER — PROPOFOL 10 MG/ML IV BOLUS
INTRAVENOUS | Status: AC
  Filled 2013-12-08: qty 20

## 2013-12-08 MED ORDER — PROPOFOL 10 MG/ML IV BOLUS
INTRAVENOUS | Status: DC | PRN
  Administered 2013-12-08: 200 mg via INTRAVENOUS

## 2013-12-08 MED ORDER — LACTATED RINGERS IV SOLN: INTRAVENOUS | Status: DC

## 2013-12-08 MED ORDER — DEXAMETHASONE SODIUM PHOSPHATE 10 MG/ML IJ SOLN
INTRAMUSCULAR | Status: DC | PRN
  Administered 2013-12-08: 10 mg via INTRAVENOUS

## 2013-12-08 MED ORDER — LACTATED RINGERS IR SOLN
Status: DC | PRN
  Administered 2013-12-08: 1

## 2013-12-08 MED ORDER — SODIUM CHLORIDE 0.9 % IJ SOLN: 3.0000 mL | Freq: Two times a day (BID) | INTRAMUSCULAR | Status: DC

## 2013-12-08 MED ORDER — LIDOCAINE HCL (CARDIAC) 20 MG/ML IV SOLN
INTRAVENOUS | Status: DC | PRN
  Administered 2013-12-08: 100 mg via INTRAVENOUS

## 2013-12-08 MED ORDER — GLYCOPYRROLATE 0.2 MG/ML IJ SOLN
INTRAMUSCULAR | Status: DC | PRN
  Administered 2013-12-08: 0.4 mg via INTRAVENOUS

## 2013-12-08 MED ORDER — ACETAMINOPHEN 650 MG RE SUPP
650.0000 mg | RECTAL | Status: DC | PRN
  Filled 2013-12-08: qty 1

## 2013-12-08 MED ORDER — NEOSTIGMINE METHYLSULFATE 10 MG/10ML IV SOLN
INTRAVENOUS | Status: AC
  Filled 2013-12-08: qty 1

## 2013-12-08 SURGICAL SUPPLY — 49 items
ADH SKN CLS APL DERMABOND .7 (GAUZE/BANDAGES/DRESSINGS) ×1
BAG SPEC RTRVL LRG 6X4 10 (ENDOMECHANICALS)
CABLE HIGH FREQUENCY MONO STRZ (ELECTRODE) ×2 IMPLANT
CHLORAPREP W/TINT 26ML (MISCELLANEOUS) ×2 IMPLANT
CORDS BIPOLAR (ELECTRODE) ×2 IMPLANT
COVER SURGICAL LIGHT HANDLE (MISCELLANEOUS) ×2 IMPLANT
COVER TIP SHEARS 8 DVNC (MISCELLANEOUS) ×1 IMPLANT
COVER TIP SHEARS 8MM DA VINCI (MISCELLANEOUS) ×1
DERMABOND ADVANCED (GAUZE/BANDAGES/DRESSINGS) ×1
DERMABOND ADVANCED .7 DNX12 (GAUZE/BANDAGES/DRESSINGS) ×1 IMPLANT
DRAPE SHEET LG 3/4 BI-LAMINATE (DRAPES) ×6 IMPLANT
DRAPE SURG IRRIG POUCH 19X23 (DRAPES) ×2 IMPLANT
DRAPE TABLE BACK 44X90 PK DISP (DRAPES) ×4 IMPLANT
DRAPE WARM FLUID 44X44 (DRAPE) ×2 IMPLANT
DRSG TEGADERM 6X8 (GAUZE/BANDAGES/DRESSINGS) ×4 IMPLANT
ELECT REM PT RETURN 9FT ADLT (ELECTROSURGICAL) ×2
ELECTRODE REM PT RTRN 9FT ADLT (ELECTROSURGICAL) ×1 IMPLANT
GLOVE BIO SURGEON STRL SZ 6 (GLOVE) ×6 IMPLANT
GLOVE BIO SURGEON STRL SZ 6.5 (GLOVE) ×4 IMPLANT
GOWN STRL REUS W/ TWL LRG LVL4 (GOWN DISPOSABLE) ×3 IMPLANT
GOWN STRL REUS W/TWL LRG LVL3 (GOWN DISPOSABLE) ×6 IMPLANT
GOWN STRL REUS W/TWL LRG LVL4 (GOWN DISPOSABLE) ×6
HOLDER FOLEY CATH W/STRAP (MISCELLANEOUS) ×2 IMPLANT
KIT ACCESSORY DA VINCI DISP (KITS) ×1
KIT ACCESSORY DVNC DISP (KITS) ×1 IMPLANT
KIT BASIN OR (CUSTOM PROCEDURE TRAY) ×2 IMPLANT
MANIPULATOR UTERINE 4.5 ZUMI (MISCELLANEOUS) ×2 IMPLANT
OCCLUDER COLPOPNEUMO (BALLOONS) ×2 IMPLANT
PEN SKIN MARKING BROAD (MISCELLANEOUS) ×2 IMPLANT
POUCH SPECIMEN RETRIEVAL 10MM (ENDOMECHANICALS) IMPLANT
SET TUBE IRRIG SUCTION NO TIP (IRRIGATION / IRRIGATOR) ×2 IMPLANT
SHEET LAVH (DRAPES) ×2 IMPLANT
SOLUTION ANTI FOG 6CC (MISCELLANEOUS) ×2 IMPLANT
SOLUTION ELECTROLUBE (MISCELLANEOUS) ×2 IMPLANT
SUT VIC AB 0 CT1 27 (SUTURE) ×2
SUT VIC AB 0 CT1 27XBRD ANTBC (SUTURE) ×1 IMPLANT
SUT VIC AB 4-0 PS2 27 (SUTURE) ×4 IMPLANT
SUT VICRYL 0 UR6 27IN ABS (SUTURE) ×2 IMPLANT
SYR 50ML LL SCALE MARK (SYRINGE) ×2 IMPLANT
TOWEL OR 17X26 10 PK STRL BLUE (TOWEL DISPOSABLE) ×4 IMPLANT
TOWEL OR NON WOVEN STRL DISP B (DISPOSABLE) ×2 IMPLANT
TRAP SPECIMEN MUCOUS 40CC (MISCELLANEOUS) ×1 IMPLANT
TRAY FOLEY CATH 14FRSI W/METER (CATHETERS) ×2 IMPLANT
TRAY LAPAROSCOPIC (CUSTOM PROCEDURE TRAY) ×2 IMPLANT
TROCAR 12M 150ML BLUNT (TROCAR) ×2 IMPLANT
TROCAR BLADELESS OPT 5 100 (ENDOMECHANICALS) ×2 IMPLANT
TROCAR XCEL 12X100 BLDLESS (ENDOMECHANICALS) ×2 IMPLANT
TUBING INSUFFLATION 10FT LAP (TUBING) ×2 IMPLANT
WATER STERILE IRR 1500ML POUR (IV SOLUTION) ×2 IMPLANT

## 2013-12-08 NOTE — Progress Notes (Signed)
Report to Kim

## 2013-12-08 NOTE — Op Note (Signed)
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   Procedure(s) Performed: Robotic-assisted laparoscopic bilateral salpingo-oophorectomy  Surgeon: Donaciano Eva, MD  Assistant Surgeon: Lahoma Crocker M.D. (an MD assistant was necessary for tissue manipulation, management of robotic instrumentation, retraction and positioning due to the complexity of the case and hospital policies).   Anesthesia: Gen. endotracheal.   Specimens: Bilateral ovaries, fallopian tubes, pelvic washings  Estimated Blood Loss: <20 mL. Blood Replacement: None  Complications: none  Indication for Procedure:  Left lower quadrant pain and left complex ovarian cyst on [place].  Operative Findings: Normal 6-8 week size uterus with grossly normal appearing (externally) left ovary. Intramural fibroid left fundus. Right ovary adherent to right ovarian fossa/medial leaf. Normal anatomy otherwise.   Frozen pathology was consistent with cystic teratoma of the left ovary  Procedure: The patient's taken to the operating room and placed under general endotracheal anesthesia testing difficulty. She is placed in a dorsolithotomy position and cervical acromial pad was placed. The arms were tucked with care taken to pad the olecranon process. And prepped and draped in usual sterile fashion. The zumi uterine manipulator was placed vaginally with a small koh cup.  Entry took place in the abdomen in left upper quadrant with a 60mm optiview entry. In an atraumatic fashion intraperitoneal port placement took place and CO2 insufflation occurred. The patient was placed in steep trendelenberg and additional ports were placed under direct visualiztion including a 85OY umbilical port, and 22mm left and right mid abdominal ports. The intestines were swept into the upper abdomen. The robot was docked.  The abdomen was inspected as was the pelvis.  Pelvic washings were obtained. An  incision was made through the left broad ligament and the retroperitoneal space entered. The ureter was identified and the left infundibulopelvic vessels were skeletonized cauterized and transected. The utero-ovarian ligaments similarly were cauterized and transected. Specimen was placed in an Endo Catch bag and sent for frozen section.  In a similar manner the right round ligament was transected retroperitoneal space entered. There was meticulous sharp dissection of the ureter off the broad ligament in order to facilitate removal of the right ovary which was somewhat adherent to the medial surface of this peritoneum. The utero-ovarian ligament was skeletonized cauterized and transected. The right utero-ovarian ligaments were cauterized and transected in the right adnexa was placed in an Endo Catch bag.  The abdomen was copiously irrigated and drained and all operative sites inspected and hemostasis was assured.  The robot was undocked. Frozen section revealed benign disease.  The ports were all removed and the subcutaneous tissues were irrigated.  The umbilical incision and left upper quadrant incisions were closed at the fascia with 0-vicryl. The skin was closed at all sites with 4-0vicryl.   Sponge, lap and needle counts were correct x 3.    The patient had sequential compression devices for VTE prophylaxis.         Disposition: PACU          Condition: stable

## 2013-12-08 NOTE — Progress Notes (Signed)
Up to BR again . Scant vaginal drainage noted on peri pad.

## 2013-12-08 NOTE — Discharge Instructions (Signed)
Bilateral Salpingo-Oophorectomy, Care After Refer to this sheet in the next few weeks. These instructions provide you with information on caring for yourself after your procedure. Your health care provider may also give you more specific instructions. Your treatment has been planned according to current medical practices, but problems sometimes occur. Call your health care provider if you have any problems or questions after your procedure. WHAT TO EXPECT AFTER THE PROCEDURE After your procedure, it is typical to have the following:  Abdominal pain that can be controlled with pain medicine.  Vaginal spotting.  Constipation. HOME CARE INSTRUCTIONS   Get plenty of rest and sleep.  Only take over-the-counter or prescription medicines as directed by your health care provider. Do not take aspirin. It can cause bleeding.  Keep incision areas clean and dry. Remove or change any bandages (dressings) only as directed by your health care provider.  Follow your health care provider's advice regarding diet.  Drink enough fluids to keep your urine clear or pale yellow.  Limit exercise and activities as directed by your health care provider. Do not lift anything heavier than 5 pounds (2.3 kg) until your health care provider approves.  Do not drive until your health care provider approves.  Do not drink alcohol until your health care provider approves.  Do not have sexual intercourse until your health care provider says it is OK.  Take your temperature twice a day and write it down.  If you become constipated, you may:  Ask your health care provider about taking a mild laxative.  Add more fruit and bran to your diet.  Drink more fluids.  Follow up with your health care provider as directed. SEEK MEDICAL CARE IF:   You have swelling or redness in the incision area.  You develop a rash.  You feel lightheaded.  You have pain that is not controlled with medicine.  You have pain,  swelling, or redness where the IV access tube was placed. SEEK IMMEDIATE MEDICAL CARE IF:  You have a fever.  You develop increasing abdominal pain.  You see pus coming out of the incision, or the incision is separating.  You notice a bad smell coming from the wound or dressing.  You have excessive vaginal bleeding.  You feel sick to your stomach (nauseous) and vomit.  You have leg or chest pain.  You have pain when you urinate.  You develop shortness of breath.  You pass out. Document Released: 11/25/2008 Document Revised: 11/19/2012 Document Reviewed: 07/23/2012 [facility] Patient Information 2015 [place], [place]. This information is not intended to replace advice given to you by your health care provider. Make sure you discuss any questions you have with your health care provider. 12/08/2013    Activity: Be up and out of the bed during the day.  Take a nap if needed.  You may walk up steps but be careful and use the hand rail.  Stair climbing will tire you more than you think, you may need to stop part way and rest.   Do Not drive if you are taking narcotic pain medicine.  Shower daily.  Use soap and water on your incision and pat dry; don't rub.    No sexual activity and nothing in the vagina for 6 weeks.  Diet: 1. Low sodium Heart Healthy Diet is recommended.  2. It is safe to use a laxative if you have difficulty moving your bowels.   Wound Care: 1. Keep clean and dry.  Shower daily.  Reasons to  call the Doctor:   Fever - Oral temperature greater than 100.4 degrees Fahrenheit  Foul-smelling vaginal discharge  Difficulty urinating  Nausea and vomiting  Increased pain at the site of the incision that is unrelieved with pain medicine.  Difficulty breathing with or without chest pain  New calf pain especially if only on one side  Sudden, continuing increased vaginal bleeding with or without clots.     Contacts: For questions or concerns you should  contact:  Dr. Lahoma Crocker at [phone]  Dr. Everitt Amber at [phone]

## 2013-12-08 NOTE — H&P (View-Only) (Signed)
Preoperative Note: Gyn-Onc  Original consult was requested by Dr. Carren Rang for the evaluation of Janet Wilson 79 y.o. female  CC:  Chief Complaint  Patient presents with  . Ovarian Cyst    Follow up    Assessment/Plan:  Janet. Janet Wilson  is a 79 y.o.  with a complex left adnexal mass measuring 15 x 8 mm with a 7 mm solid area blood flow. CA 125 within normal limits. Our recommendation is for a robotic assisted laparoscopic bilateral salpingo-oophorectomy with staging and other indicated procedures determined by the results of frozen section. Her temporal arteritis has been evaluated (today) and deemed to be stable. Her dose of steroids is dropping and she is at 5mg  q day. The patient and her husband are aware that the procedure may need to be converted to an open procedure and that decision will be dependent upon the surgical findings. The risks of the procedure shared were inclusive of infection bleeding damage to surrounding structures prolonged hospitalization and reoperation. All of their questions were answered to her satisfaction. The plan is for the procedure to occur on 12/08/2013.   HPI: Janet. Janet Wilson  is a 79 y.o.   G2P2 LNMP in herr 40's.  Janet Wilson reports the presence of more abdominal pain predominantly in the left for approximately one year. States the pain occasionally radiates to the back. The workup has included findings of arthritis and diverticulosis however an ultrasound on 08/25/2048 was notable for presence of a 10 by 52mm left ovarian cysts with a 7 mm solid area. Blood flow was seen within the solid area no free fluid was noted. A repeat scan in September of 2015 noted the same left ovarian cyst it now measures 15 x 8 mm. Septations were appreciated with internal echoes in the same 7 mm solid area. A CA 125 returned to value of 5.  Her history is notable for right temporal arteritis requiring chronic steroid use for approximately 1-1/2  years. The highest dose was 60 mg of prednisone a day. She's currently at 5 mg daily, and her sed rate is currently falling.  Review of Systems:  Constitutional  Feels well, Cardiovascular  No chest pain, shortness of breath, or edema  Pulmonary  No cough or wheeze.  Gastro Intestinal  No nausea, vomitting, or diarrhoea. No bright red blood per rectum, no abdominal pain, change in bowel movement, or constipation. No early satiety, no abdominal bloating. Genito Urinary  No frequency, urgency, dysuria,no vaginal discharge or bleeding Musculo Skeletal  No myalgia, arthralgia, joint swelling or pain  Neurologic  No weakness, numbness, change in gait,  Psychology  No depression, anxiety, insomnia.    Current Meds:  Outpatient Encounter Prescriptions as of 11/27/2013  Medication Sig  . amoxicillin (AMOXIL) 875 MG tablet   . aspirin EC 81 MG tablet Take 81 mg by mouth daily.  . Biotin (BIOTIN 5000) 5 MG CAPS Take 1 capsule by mouth daily.  . brinzolamide (AZOPT) 1 % ophthalmic suspension Place 1 drop into the left eye 2 (two) times daily.  . Calcium Carbonate-Vitamin D (CALCIUM PLUS VITAMIN D PO) Take 1 tablet by mouth daily.  . Coenzyme Q10 (CO Q 10) 100 MG CAPS Take 1 tablet by mouth daily.  . diazepam (VALIUM) 5 MG tablet Take 2.5 mg by mouth at bedtime.   Marland Kitchen estradiol (ESTRACE) 0.1 MG/GM vaginal cream Place 2 g vaginally once a week.  . fluocinonide cream (LIDEX) 0.05 % Apply 1  application topically as needed.  . magnesium oxide (MAG-OX) 400 MG tablet Take 400 mg by mouth daily with breakfast.  . nitrofurantoin, macrocrystal-monohydrate, (MACROBID) 100 MG capsule Take 100 mg by mouth 2 (two) times daily as needed (bladder infection).  . Omega-3 Fatty Acids (FISH OIL) 1200 MG CAPS Take 1 capsule by mouth daily.  Marland Kitchen omeprazole (PRILOSEC) 20 MG capsule Take 20 mg by mouth daily with breakfast.  . POTASSIUM PO Take 400 mg by mouth daily.  . prednisoLONE 5 MG TABS tablet Take 5 mg by  mouth daily.  . Probiotic Product (PROBIOTIC PO) Take 1 capsule by mouth daily.  Marland Kitchen zolmitriptan (ZOMIG) 5 MG tablet Take 5 mg by mouth daily as needed for migraine. May repeat after 2 hours if headache returns.  Marland Kitchen ZOSTAVAX 50932 UNT/0.65ML injection     Allergy:  Allergies  Allergen Reactions  . Oxycodone Anaphylaxis  . Codeine Nausea Only    Social Hx:   History   Social History  . Marital Status: Married    Spouse Name: [name]    Number of Children: 2  . Years of Education: college   Occupational History  . retired    Social History Main Topics  . Smoking status: Never Smoker   . Smokeless tobacco: Never Used  . Alcohol Use: Yes     Comment: SELDOM  . Drug Use: No  . Sexual Activity: Yes   Other Topics Concern  . Not on file   Social History Narrative   Pt lives at home with family.   Caffeine Use: Very little.     Past Surgical Hx:  Past Surgical History  Procedure Laterality Date  . Cervical fusion  2012  . Left shoulder surgery    . Incontinence surgery    . Right knee arthroscopy  11/2009    . Eye surgery      CATARACT EXTRACTION- BILATERAL  . Total knee arthroplasty  07/04/2011    Procedure: TOTAL KNEE ARTHROPLASTY;  Surgeon: Gearlean Alf, MD;  Location: [facility];  Service: Orthopedics;  Laterality: Right;  . Joint replacement      right knee  . Artery biopsy Right 07/21/2012    Procedure: BIOPSY TEMPORAL ARTERY;  Surgeon: Haywood Lasso, MD;  Location: [place];  Service: General;  Laterality: Right;    Past Medical Hx:  Past Medical History  Diagnosis Date  . Overactive bladder   . GERD (gastroesophageal reflux disease)   . Goiter     CAUSING COUGH, HOARSINESS AND DRY THROAT  . Headache(784.0)     MIGRAINES - TAKES VERAPAMIL FOR PREVENTION OF MIGRAINES  . Arthritis     OA BOTH KNEES AND HANDS  . Glaucoma     left eye  . Hypertension     not on any BP meications  . Temporal arteritis     Past Gynecological History:  G2P2 NSVDx2  stillborn at 73 months Menarche 39, regular menses until 45's.  No h/o an abnormal pap.  No LMP recorded. Patient is postmenopausal. H/O secondary infertility.  Never used birth contro  Family Hx:  Family History  Problem Relation Age of Onset  . Alzheimer's disease Mother   . Heart attack Father     Vitals:  Blood pressure 140/64, pulse 86, temperature 97.9 F (36.6 C), temperature source Oral, resp. rate 22, height 5\' 3"  (1.6 m), weight 121 lb 14.4 oz (55.293 kg). Body mass index is 21.6 kg/(m^2).  Physical Exam: WD in NAD Neck  Supple  NROM, without any enlargements.  Lymph Node Survey No cervical supraclavicular or inguinal adenopathy Cardiovascular  Pulse normal rate, regularity and rhythm. S1 and S2 normal. Cardiac monitor in place Lungs  Clear to auscultation bilaterally. Good air movement.  Skin  No rash/lesions/breakdown  Psychiatry  Alert and oriented appropriate mood affect speech and reasoning. Abdomen  Normoactive bowel sounds, abdomen soft, non-tender. Back No CVA tenderness Genito Urinary  Vulva/vagina: Normal external female genitalia.  No lesions. No discharge or bleeding.  Bladder/urethra:  No lesions or masses  Vagina: atrophic no lesions Cervix: 2cm Normal appearing, no lesions.  Uterus: Mobile, no parametrial involvement or nodularity.  Adnexa: No palpable masses. No cul de sac nodularity Rectal  Good tone, no masses no cul de sac nodularity.  Extremities  No bilateral cyanosis, clubbing or edema.   Donaciano Eva, MD, PhD 11/27/2013, 5:13 PM

## 2013-12-08 NOTE — Progress Notes (Signed)
No further bleeding noted to peri pad prior to discharge home with husband.

## 2013-12-08 NOTE — Anesthesia Preprocedure Evaluation (Addendum)
Anesthesia Evaluation  Patient identified by MRN, date of birth, ID band Patient awake    Reviewed: Allergy & Precautions, H&P , NPO status , Patient's Chart, lab work & pertinent test results, reviewed documented beta blocker date and time   Airway Mallampati: II  TM Distance: >3 FB Neck ROM: full    Dental no notable dental hx. (+) Teeth Intact, Dental Advisory Given   Pulmonary neg pulmonary ROS,  breath sounds clear to auscultation  Pulmonary exam normal       Cardiovascular Exercise Tolerance: Good negative cardio ROS  Rhythm:regular Rate:Normal  Atrial tachycardia   Neuro/Psych  Headaches, Cervical fusion. Temporal arteritis. glaucoma negative psych ROS   GI/Hepatic negative GI ROS, Neg liver ROS, GERD-  Medicated and Controlled,  Endo/Other  negative endocrine ROSgoiter  Renal/GU negative Renal ROS  negative genitourinary   Musculoskeletal   Abdominal   Peds  Hematology negative hematology ROS (+)   Anesthesia Other Findings   Reproductive/Obstetrics negative OB ROS                            Anesthesia Physical Anesthesia Plan  ASA: III  Anesthesia Plan: General   Post-op Pain Management:    Induction: Intravenous  Airway Management Planned: Oral ETT  Additional Equipment:   Intra-op Plan:   Post-operative Plan: Extubation in OR  Informed Consent: I have reviewed the patients History and Physical, chart, labs and discussed the procedure including the risks, benefits and alternatives for the proposed anesthesia with the patient or authorized representative who has indicated his/her understanding and acceptance.   Dental Advisory Given  Plan Discussed with: CRNA and Surgeon  Anesthesia Plan Comments:         Anesthesia Quick Evaluation

## 2013-12-08 NOTE — Anesthesia Postprocedure Evaluation (Signed)
  Anesthesia Post-op Note  Patient: Janet Wilson  Procedure(s) Performed: Procedure(s) (LRB): ROBOTIC ASSISTED LAPAROSCOPIC BILATERAL SALPINGO OOPHORECTOMY WITH STAGING (Bilateral)  Patient Location: PACU  Anesthesia Type: General  Level of Consciousness: awake and alert   Airway and Oxygen Therapy: Patient Spontanous Breathing  Post-op Pain: mild  Post-op Assessment: Post-op Vital signs reviewed, Patient's Cardiovascular Status Stable, Respiratory Function Stable, Patent Airway and No signs of Nausea or vomiting  Last Vitals:  Filed Vitals:   12/08/13 1330  BP: 110/48  Pulse: 52  Temp:   Resp: 8    Post-op Vital Signs: stable   Complications: No apparent anesthesia complications

## 2013-12-08 NOTE — Progress Notes (Signed)
Moderate amount of bright red vaginal drainage Peri pad to patient.

## 2013-12-08 NOTE — Interval H&P Note (Signed)
History and Physical Interval Note:  12/08/2013 10:06 AM  [name]  has presented today for surgery, with the diagnosis of ovarian cyst  The various methods of treatment have been discussed with the patient and family. After consideration of risks, benefits and other options for treatment, the patient has consented to  Procedure(s): ROBOTIC ASSISTED LAPAROSCOPIC BILATERAL SALPINGO OOPHORECTOMY WITH STAGING (Bilateral) as a surgical intervention .  The patient's history has been reviewed, patient examined, no change in status, stable for surgery.  I have reviewed the patient's chart and labs.  Questions were answered to the patient's satisfaction.     Janet Wilson

## 2013-12-08 NOTE — Transfer of Care (Signed)
Immediate Anesthesia Transfer of Care Note  Patient: Janet Wilson  Procedure(s) Performed: Procedure(s): ROBOTIC ASSISTED LAPAROSCOPIC BILATERAL SALPINGO OOPHORECTOMY WITH STAGING (Bilateral)  Patient Location: PACU  Anesthesia Type:General  Level of Consciousness: sedated  Airway & Oxygen Therapy: Patient Spontanous Breathing and Patient connected to face mask oxygen  Post-op Assessment: Report given to PACU RN and Post -op Vital signs reviewed and stable  Post vital signs: Reviewed and stable  Complications: No apparent anesthesia complications

## 2013-12-09 ENCOUNTER — Encounter (HOSPITAL_COMMUNITY): Payer: Self-pay | Admitting: Gynecologic Oncology

## 2013-12-27 ENCOUNTER — Telehealth: Payer: Self-pay | Admitting: Gynecologic Oncology

## 2013-12-27 NOTE — Telephone Encounter (Signed)
Office Visit:  GYN ONCOLOGY   Alec Mcphee Sok 79 y.o. female  CC: Post operative check.  S/P RABSO   Assessment/Plan:  Ms. NASTACIA RAYBUCK  is a 79 y.o.  with a complex left adnexal mass measuring 15 x 8 mm with a 7 mm solid area blood flow. CA 125 within normal limits. Our recommendation is for a robotic assisted laparoscopic bilateral salpingo-oophorectomy on 12/08/2013.   Follow-up with Dr. Carren Rang within 6 months   HPI: Ms. ERICA RICHWINE  is a 79 y.o.   G2P2 LNMP in herr 40's.  Ms Linzy Laury Depaulo reports the presence of more abdominal pain predominantly in the left for approximately one year. States the pain occasionally radiates to the back. The workup has included findings of arthritis and diverticulosis however an ultrasound on 08/25/2048 was notable for presence of a 10 by 79mm left ovarian cysts with a 7 mm solid area. Blood flow was seen within the solid area no free fluid was noted. A repeat scan in September of 2015 noted the same left ovarian cyst it now measures 15 x 8 mm. Septations were appreciated with internal echoes in the same 7 mm solid area. A CA 125 returned to value of 5.  Her history is notable for right temporal arteritis requiring chronic steroid use for approximately 1-1/2 years.    On 12/08/2013 she underwent Robotic lsc BSO.   Path 1. Ovary and fallopian tube, left MATURE CYSTIC TERATOMA WITH MICROCALCIFICATIONS. BENIGN FALLOPIAN TUBAL TISSUE. NO ATYPIA OR MALIGNANCY. 2. Ovary and fallopian tube, right BENIGN OVARIAN TISSUE. BENIGN FALLOPIAN TUBAL TISSUE. NO ATYPIA OR MALIGNANCY.   Review of Systems:  Constitutional  Feels well, Cardiovascular  No chest pain, shortness of breath, or edema  Pulmonary  No cough or wheeze.  Gastro Intestinal  No nausea, vomitting, or diarrhoea. No bright red blood per rectum, no abdominal pain, change in bowel movement, or constipation. No early satiety, no abdominal bloating. Genito Urinary  No  frequency, urgency, dysuria,no vaginal discharge or bleeding Musculo Skeletal  No myalgia, arthralgia, joint swelling or pain  Neurologic  No weakness, numbness, change in gait,  Psychology  No depression, anxiety, insomnia.    Social Hx:   History   Social History  . Marital Status: Married    Spouse Name: [name]    Number of Children: 2  . Years of Education: college   Occupational History  . retired    Social History Main Topics  . Smoking status: Never Smoker   . Smokeless tobacco: Never Used  . Alcohol Use: Yes     Comment: SELDOM  . Drug Use: No  . Sexual Activity: Yes   Other Topics Concern  . Not on file   Social History Narrative   Pt lives at home with family.   Caffeine Use: Very little.     Past Surgical Hx:  Past Surgical History  Procedure Laterality Date  . Cervical fusion  2012  . Left shoulder surgery    . Incontinence surgery    . Right knee arthroscopy  11/2009    . Eye surgery      CATARACT EXTRACTION- BILATERAL  . Total knee arthroplasty  07/04/2011    Procedure: TOTAL KNEE ARTHROPLASTY;  Surgeon: Gearlean Alf, MD;  Location: [facility];  Service: Orthopedics;  Laterality: Right;  . Joint replacement      right knee  . Artery biopsy Right 07/21/2012    Procedure: BIOPSY TEMPORAL ARTERY;  Surgeon: Autumn Patty  Margot Chimes, MD;  Location: [place];  Service: General;  Laterality: Right;  . Robotic assisted bilateral salpingo oopherectomy Bilateral 12/08/2013    Procedure: ROBOTIC ASSISTED LAPAROSCOPIC BILATERAL SALPINGO OOPHORECTOMY WITH STAGING;  Surgeon: Everitt Amber, MD;  Location: [facility];  Service: Gynecology;  Laterality: Bilateral;    Past Medical Hx:  Past Medical History  Diagnosis Date  . Overactive bladder   . GERD (gastroesophageal reflux disease)   . Goiter     CAUSING COUGH, HOARSINESS AND DRY THROAT  . Arthritis     OA BOTH KNEES AND HANDS  . Glaucoma     left eye  . Temporal arteritis   . Headache(784.0)     MIGRAINES   . Atrial  tachycardia     HX OF    Past Gynecological History:  G2P2 NSVDx2 stillborn at 54 months Menarche 11, regular menses until 39's.  No h/o an abnormal pap.  No LMP recorded. Patient is postmenopausal. H/O secondary infertility.  Never used birth control  Family Hx:  Family History  Problem Relation Age of Onset  . Alzheimer's disease Mother   . Heart attack Father     Vitals:  Blood pressure 140/64, pulse 86, temperature 97.9 F (36.6 C), temperature source Oral, resp. rate 22, height 5\' 3"  (1.6 m), weight 121 lb 14.4 oz (55.293 kg). There is no weight on file to calculate BMI.  Physical Exam: WD in NAD Neck  Supple NROM, without any enlargements.  Lymph Node Survey No cervical supraclavicular or inguinal adenopathy Cardiovascular  Pulse normal rate, regularity and rhythm. S1 and S2 normal. Cardiac monitor in place Lungs  Clear to auscultation bilaterally. Good air movement.  Psychiatry  Alert and oriented appropriate mood affect speech and reasoning. Abdomen  Normoactive bowel sounds, abdomen soft, non-tender. Back No CVA tenderness Genito Urinary  Vulva/vagina: Normal external female genitalia.  No lesions. No discharge or bleeding.  Bladder/urethra:  No lesions or masses  Vagina: atrophic no lesions Cervix: 2cm Normal appearing, no lesions. Extremities  No bilateral cyanosis, clubbing or edema.   Janie Morning, MD, PhD 12/27/2013, 6:20 PM

## 2013-12-28 ENCOUNTER — Ambulatory Visit: Payer: Medicare Other | Attending: Gynecologic Oncology | Admitting: Gynecologic Oncology

## 2013-12-28 ENCOUNTER — Encounter: Payer: Self-pay | Admitting: Gynecologic Oncology

## 2013-12-28 DIAGNOSIS — N8320 Unspecified ovarian cysts: Secondary | ICD-10-CM

## 2013-12-28 DIAGNOSIS — K219 Gastro-esophageal reflux disease without esophagitis: Secondary | ICD-10-CM | POA: Diagnosis not present

## 2013-12-28 DIAGNOSIS — Z90722 Acquired absence of ovaries, bilateral: Secondary | ICD-10-CM | POA: Diagnosis not present

## 2013-12-28 DIAGNOSIS — M316 Other giant cell arteritis: Secondary | ICD-10-CM | POA: Insufficient documentation

## 2013-12-28 DIAGNOSIS — E049 Nontoxic goiter, unspecified: Secondary | ICD-10-CM | POA: Insufficient documentation

## 2013-12-28 DIAGNOSIS — Z9079 Acquired absence of other genital organ(s): Secondary | ICD-10-CM | POA: Diagnosis not present

## 2013-12-28 DIAGNOSIS — Z7952 Long term (current) use of systemic steroids: Secondary | ICD-10-CM | POA: Insufficient documentation

## 2013-12-28 DIAGNOSIS — N3281 Overactive bladder: Secondary | ICD-10-CM | POA: Insufficient documentation

## 2013-12-28 DIAGNOSIS — N83299 Other ovarian cyst, unspecified side: Secondary | ICD-10-CM

## 2013-12-28 DIAGNOSIS — D271 Benign neoplasm of left ovary: Secondary | ICD-10-CM

## 2013-12-28 DIAGNOSIS — R1900 Intra-abdominal and pelvic swelling, mass and lump, unspecified site: Secondary | ICD-10-CM | POA: Diagnosis present

## 2013-12-28 NOTE — Progress Notes (Signed)
Office Visit:  GYN ONCOLOGY   Janet Wilson 79 y.o. female  CC: Post operative check.  S/P RABSO   Assessment/Plan:  Janet Wilson  is a 79 y.o.  with a complex left adnexal mass measuring 15 x 8 mm with a 7 mm solid area blood flow. CA 125 within normal limits. Our recommendation is for a robotic assisted laparoscopic bilateral salpingo-oophorectomy on 12/08/2013.   Follow-up with Dr. Carren Rang within 6 months   HPI: Janet Wilson  is a 79 y.o.   G2P2 LNMP in herr 40's.  Janet Wilson reports the presence of more abdominal pain predominantly in the left for approximately one year. States the pain occasionally radiates to the back. The workup has included findings of arthritis and diverticulosis however an ultrasound on 08/25/2048 was notable for presence of a 10 by 28mm left ovarian cysts with a 7 mm solid area. Blood flow was seen within the solid area no free fluid was noted. A repeat scan in September of 2015 noted the same left ovarian cyst it now measures 15 x 8 mm. Septations were appreciated with internal echoes in the same 7 mm solid area. A CA 125 returned to value of 5.  Her history is notable for right temporal arteritis requiring chronic steroid use for approximately 1-1/2 years.    On 12/08/2013 she underwent Robotic lsc BSO.   Path 1. Ovary and fallopian tube, left MATURE CYSTIC TERATOMA WITH MICROCALCIFICATIONS. BENIGN FALLOPIAN TUBAL TISSUE. NO ATYPIA OR MALIGNANCY. 2. Ovary and fallopian tube, right BENIGN OVARIAN TISSUE. BENIGN FALLOPIAN TUBAL TISSUE. NO ATYPIA OR MALIGNANCY.   Review of Systems:  Constitutional  Feels well, Cardiovascular  No chest pain, shortness of breath, or edema no visual changes Pulmonary  No cough or wheeze.  Gastro Intestinal  No nausea, vomitting, or diarrhoea. No bright red blood per rectum, no abdominal pain, change in bowel movement, or constipation. No early satiety, no abdominal  bloating. Genito Urinary  No frequency, urgency, dysuria, no vaginal discharge or bleeding Musculo Skeletal  No myalgia, arthralgia, joint swelling or pain  Neurologic  No weakness, numbness, change in gait,  Psychology  No depression, anxiety, insomnia.    Social Hx:   History   Social History  . Marital Status: Married    Spouse Name: [name]    Number of Children: 2  . Years of Education: college   Occupational History  . retired    Social History Main Topics  . Smoking status: Never Smoker   . Smokeless tobacco: Never Used  . Alcohol Use: Yes     Comment: SELDOM  . Drug Use: No  . Sexual Activity: Yes   Other Topics Concern  . Not on file   Social History Narrative   Pt lives at home with family.   Caffeine Use: Very little.     Past Surgical Hx:  Past Surgical History  Procedure Laterality Date  . Cervical fusion  2012  . Left shoulder surgery    . Incontinence surgery    . Right knee arthroscopy  11/2009    . Eye surgery      CATARACT EXTRACTION- BILATERAL  . Total knee arthroplasty  07/04/2011    Procedure: TOTAL KNEE ARTHROPLASTY;  Surgeon: Gearlean Alf, MD;  Location: [facility];  Service: Orthopedics;  Laterality: Right;  . Joint replacement      right knee  . Artery biopsy Right 07/21/2012    Procedure: BIOPSY TEMPORAL ARTERY;  Surgeon: Haywood Lasso, MD;  Location: [place];  Service: General;  Laterality: Right;  . Robotic assisted bilateral salpingo oopherectomy Bilateral 12/08/2013    Procedure: ROBOTIC ASSISTED LAPAROSCOPIC BILATERAL SALPINGO OOPHORECTOMY WITH STAGING;  Surgeon: Everitt Amber, MD;  Location: [facility];  Service: Gynecology;  Laterality: Bilateral;    Past Medical Hx:  Past Medical History  Diagnosis Date  . Overactive bladder   . GERD (gastroesophageal reflux disease)   . Goiter     CAUSING COUGH, HOARSINESS AND DRY THROAT  . Arthritis     OA BOTH KNEES AND HANDS  . Glaucoma     left eye  . Temporal arteritis   .  Headache(784.0)     MIGRAINES   . Atrial tachycardia     HX OF    Past Gynecological History:  G2P2 NSVDx2 stillborn at 110 months Menarche 89, regular menses until 31's.  No h/o an abnormal pap.  No LMP recorded. Patient is postmenopausal. H/O secondary infertility.  Never used birth control  Family Hx:  Family History  Problem Relation Age of Onset  . Alzheimer's disease Mother   . Heart attack Father     Vitals:  Blood pressure 144/75, pulse 91, temperature 98.3 F (36.8 C), resp. rate 16, height 5\' 3"  (1.6 m), weight 118 lb 3.2 oz (53.615 kg). Body mass index is 20.94 kg/(m^2).  Physical Exam: WD in NAD Neck  Supple NROM, without any enlargements.  Psychiatry  Alert and oriented appropriate mood affect speech and reasoning. Abdomen  Normoactive bowel sounds, abdomen soft, non-tender. Incision sites healing well, no hernia, tenderness or erythema. Back No CVA tenderness Extremities No clubbing cyanosis or edema.    Janie Morning, MD, PhD 12/28/2013, 3:53 PM

## 2013-12-28 NOTE — Patient Instructions (Signed)
Path 1. Ovary and fallopian tube, left MATURE CYSTIC TERATOMA WITH MICROCALCIFICATIONS. BENIGN FALLOPIAN TUBAL TISSUE. NO ATYPIA OR MALIGNANCY. 2. Ovary and fallopian tube, right BENIGN OVARIAN TISSUE. BENIGN FALLOPIAN TUBAL TISSUE. NO ATYPIA OR MALIGNANCY.   Follow-up with Dr. Carren Rang  HAPPY HOLIDAYS!!   Thank you very much [name] for allowing me to provide care for you today.  I appreciate your confidence in choosing our Gynecologic Oncology team.  If you have any questions about your visit today please call our office and we will get back to you as soon as possible.  Please consider using the website Medlineplus.gov as an Geneticist, molecular.   Francetta Found. Ragina Fenter MD., PhD Gynecologic Oncology

## 2014-01-04 ENCOUNTER — Other Ambulatory Visit: Payer: Self-pay | Admitting: Endocrinology

## 2014-01-04 DIAGNOSIS — E049 Nontoxic goiter, unspecified: Secondary | ICD-10-CM

## 2014-01-11 ENCOUNTER — Other Ambulatory Visit (INDEPENDENT_AMBULATORY_CARE_PROVIDER_SITE_OTHER): Payer: Self-pay

## 2014-01-11 DIAGNOSIS — Z0289 Encounter for other administrative examinations: Secondary | ICD-10-CM

## 2014-01-11 DIAGNOSIS — M316 Other giant cell arteritis: Secondary | ICD-10-CM

## 2014-01-12 ENCOUNTER — Ambulatory Visit (INDEPENDENT_AMBULATORY_CARE_PROVIDER_SITE_OTHER): Payer: Medicare Other | Admitting: Neurology

## 2014-01-12 ENCOUNTER — Encounter: Payer: Self-pay | Admitting: Neurology

## 2014-01-12 VITALS — BP 134/80 | HR 81 | Ht 63.5 in | Wt 119.2 lb

## 2014-01-12 DIAGNOSIS — M316 Other giant cell arteritis: Secondary | ICD-10-CM

## 2014-01-12 LAB — SEDIMENTATION RATE: Sed Rate: 4 mm/hr (ref 0–40)

## 2014-01-12 LAB — HIGH SENSITIVITY CRP: CRP, High Sensitivity: 4.6 mg/L — ABNORMAL HIGH (ref 0.00–3.00)

## 2014-01-12 MED ORDER — PREDNISONE 5 MG PO TABS: 5.0000 mg | ORAL_TABLET | Freq: Every day | ORAL | Status: DC

## 2014-01-12 NOTE — Patient Instructions (Signed)
Overall you are doing fairly well but I do want to suggest a few things today:   Remember to drink plenty of fluid, eat healthy meals and do not skip any meals. Try to eat protein with a every meal and eat a healthy snack such as fruit or nuts in between meals. Try to keep a regular sleep-wake schedule and try to exercise daily, particularly in the form of walking, 20-30 minutes a day, if you can.   As far as your medications are concerned, I would like to suggest: Daily Prednisone 5mg   As far as diagnostic testing: Repeat CRP and Sed rate at next visit  I would like to see you back in 6 weeks, sooner if we need to. Please call us with any interim questions, concerns, problems, updates or refill requests.   Please also call us for any test results so we can go over those with you on the phone.  My clinical assistant and will answer any of your questions and relay your messages to me and also relay most of my messages to you.   Our phone number is [phone]. We also have an after hours call service for urgent matters and there is a physician on-call for urgent questions. For any emergencies you know to call 911 or go to the nearest emergency room

## 2014-01-12 NOTE — Progress Notes (Addendum)
[facility]    Provider:  Dr Jaynee Eagles Referring Provider: Josetta Huddle, MD Primary Care Physician:  Henrine Screws, MD  CC:  Temporal Arteritis  HPI:  Janet Wilson is a 79 y.o. female here as a referral from Dr. Inda Merlin for Temporal Arteritis. Patient is feeling well, her vision has not changed, denies stiffness in the shoulders or hips, denies jaw claudication, no headaches; she feels well. She has surgery recently and has recovered nicely. No recurrent symptoms of temporal arteritis.  She reports that she had a head tremor in the past which seemed to have resolved. Mostly when concentrating or listening to someone, yes/no tremor. No resting tremor. Not shuffling. Lost her sense of smell. Smell is almost entirely gone. Her taste is also affected. Her hands get tired when she uses them a lot, like cooking the large dinner at [place] however denies any numbnes, tingling, shooting pain or neck pain or radicular symptoms. She has arthritis and her joints swell.   Reviewed notes, labs and imaging from outside physicians, which showed: Patient had a robotic assisted laparoscopic bilateral salpingo-oophorectomy for a complex left adnexal mass. Path showed mature teratoma without malignancy.  CRP 4.60 and ESR WNL (01/11/2014)  MRi of the brian in 2014 showed mild non-specific  white matter changes, unrmarkable, personally reviewed images  Review of Systems: Patient complains of symptoms per HPI as well as the following symptoms: frequency of urination. Pertinent negatives per HPI. All others negative.   Visit 11/27/2013:  HPI: Janet Wilson is a followup for Temporal Arteritis diagnosed in May/2014 after 5 weeks of temporal headaches, fatigue, weight loss, pain with chewing. MRI of the brain was negative. Started on high dose oral prednisone with good results. Biopsy confirmed arteritis. Followed by ophthamology. She was weaned off of prednisone with recurrence  of headaches and stiffnes sin the shoulders and restarted on low-dose prednisone due to increased CRP 43 and sed 15.   She is currently on 46m daily prednisone and doing well. On the 27th she has an ovarian cyst and is going to have it removed. She is feeling well. She is still getting headaches but mild and vision has been stable. Vision is good, no difficulty chewing, no migraine, gets the headaches a few times a week for a few hours in the temple areas. No muscle stiffness in the shoulder in the hips, no muscle pain.   She has osteoporosis, dexa scan pending. Understands steroids can make this worse.   Migraines improved since taking the steroids.    11/2013 9//2015 08/2013 11/2012 09/2012 07/2012 06/2012 CRP 5.7 8.2 43 1.2 4.0 129 ESR '13 9 15 3 7 6 31   ' History   Social History  . Marital Status: Married    Spouse Name: GIona Beard   Number of Children: 2  . Years of Education: college   Occupational History  . retired    Social History Main Topics  . Smoking status: Never Smoker   . Smokeless tobacco: Never Used  . Alcohol Use: Yes     Comment: SELDOM  . Drug Use: No  . Sexual Activity: Yes   Other Topics Concern  . Not on file   Social History Narrative   Pt lives at home with family.   Caffeine Use: Very little.    Patient is married to [place]  Patient has 2 children    Patient has a college education    Patient is right handed  Family History  Problem Relation Age of Onset  . Alzheimer's disease Mother   . Heart attack Father     Past Medical History  Diagnosis Date  . Overactive bladder   . GERD (gastroesophageal reflux disease)   . Goiter     CAUSING COUGH, HOARSINESS AND DRY THROAT  . Arthritis     OA BOTH KNEES AND HANDS  . Glaucoma     left eye  . Temporal arteritis   . Headache(784.0)     MIGRAINES   .  Atrial tachycardia     HX OF    Past Surgical History  Procedure Laterality Date  . Cervical fusion  2012  . Left shoulder surgery    . Incontinence surgery    . Right knee arthroscopy  11/2009    . Eye surgery      CATARACT EXTRACTION- BILATERAL  . Total knee arthroplasty  07/04/2011    Procedure: TOTAL KNEE ARTHROPLASTY;  Surgeon: Gearlean Alf, MD;  Location: [facility];  Service: Orthopedics;  Laterality: Right;  . Joint replacement      right knee  . Artery biopsy Right 07/21/2012    Procedure: BIOPSY TEMPORAL ARTERY;  Surgeon: Haywood Lasso, MD;  Location: [facility];  Service: General;  Laterality: Right;  . Robotic assisted bilateral salpingo oopherectomy Bilateral 12/08/2013    Procedure: ROBOTIC ASSISTED LAPAROSCOPIC BILATERAL SALPINGO OOPHORECTOMY WITH STAGING;  Surgeon: Everitt Amber, MD;  Location: [facility];  Service: Gynecology;  Laterality: Bilateral;    Current Outpatient Prescriptions  Medication Sig Dispense Refill  . amoxicillin (AMOXIL) 875 MG tablet Take 3,500 mg by mouth. One hour prior to dental work.    Marland Kitchen aspirin EC 81 MG tablet Take 81 mg by mouth at bedtime.     . Biotin (BIOTIN 5000) 5 MG CAPS Take 1 capsule by mouth every morning.     . brinzolamide (AZOPT) 1 % ophthalmic suspension Place 1 drop into the left eye 2 (two) times daily.    . Calcium Carbonate-Vitamin D (CALCIUM PLUS VITAMIN D PO) Take 1 tablet by mouth 2 (two) times daily.     . Coenzyme Q10 (CO Q 10) 100 MG CAPS Take 1 tablet by mouth at bedtime.     . diazepam (VALIUM) 5 MG tablet Take 2.5 mg by mouth at bedtime.     Marland Kitchen estradiol (ESTRACE) 0.1 MG/GM vaginal cream Place 2 g vaginally once a week.    . fluocinonide cream (LIDEX) 6.81 % Apply 1 application topically daily as needed (marks.).     Marland Kitchen fluticasone (FLONASE) 50 MCG/ACT nasal spray Place 1 spray into both nostrils daily as needed for allergies or rhinitis.    Marland Kitchen HYDROmorphone (DILAUDID) 2 MG tablet Take 1 tablet (2 mg total) by mouth every 6  (six) hours as needed for severe pain. 30 tablet 0  . Ibuprofen (ADVIL) 200 MG CAPS Take 400 mg by mouth daily as needed (pain.).    Marland Kitchen magnesium oxide (MAG-OX) 400 MG tablet Take 400 mg by mouth daily with breakfast.    . nitrofurantoin, macrocrystal-monohydrate, (MACROBID) 100 MG capsule Take 100 mg by mouth 2 (two) times daily as needed (bladder infection).    . Omega-3 Fatty Acids (FISH OIL) 1200 MG CAPS Take 1 capsule by mouth every morning.     Marland Kitchen omeprazole (PRILOSEC) 20 MG capsule Take 20 mg by mouth daily with breakfast.    . POTASSIUM PO Take 400 mg by mouth every morning.     Marland Kitchen  prednisoLONE 5 MG TABS tablet Take 5 mg by mouth every morning.     . Probiotic Product (PROBIOTIC PO) Take 1 capsule by mouth at bedtime.     Marland Kitchen zolmitriptan (ZOMIG) 5 MG tablet Take 5 mg by mouth daily as needed for migraine. May repeat after 2 hours if headache returns.    . predniSONE (DELTASONE) 5 MG tablet Take 1 tablet (5 mg total) by mouth daily with breakfast. 180 tablet 3   No current facility-administered medications for this visit.    Allergies as of 01/12/2014 - Review Complete 01/12/2014  Allergen Reaction Noted  . Oxycodone Anaphylaxis 06/18/2012  . Codeine Nausea Only 07/11/2012    Vitals: BP 134/80 mmHg  Pulse 81  Ht 5' 3.5" (1.613 m)  Wt 119 lb 3.2 oz (54.069 kg)  BMI 20.78 kg/m2 Last Weight:  Wt Readings from Last 1 Encounters:  01/12/14 119 lb 3.2 oz (54.069 kg)   Last Height:   Ht Readings from Last 1 Encounters:  01/12/14 5' 3.5" (1.613 m)   Physical exam: Exam: Gen: NAD, conversant, well nourised, well groomed  CV: RRR, no MRG. No Carotid Bruits. No peripheral edema, warm, nontender Eyes: Conjunctivae clear without exudates or hemorrhage Temporal pulses felt bilaterally  Neuro: Detailed Neurologic Exam  Speech:  Speech is normal; fluent and spontaneous with normal comprehension.  Cognition:  The patient is oriented to person, place, and  time;   recent and remote memory intact;   language fluent;   normal attention, concentration,   fund of knowledge Cranial Nerves:  The pupils are equal, round, and reactive to light. The fundi are normal and spontaneous venous pulsations are present. Visual fields are full to finger confrontation. Extraocular movements are intact. Trigeminal sensation is intact and the muscles of mastication are normal. The face is symmetric. The palate elevates in the midline. Voice is normal. Shoulder shrug is normal. The tongue has normal motion without fasciculations.   Coordination:  Normal finger to nose and heel to shin. Normal rapid alternating movements.   Gait:  Heel-toe and tandem gait are normal.   Motor Observation:  No asymmetry, no atrophy, and no involuntary movements noted. Tone:  Normal muscle tone.   Posture:  Posture is normal. normal erect   Strength:  Strength is V/V in the upper and lower limbs.    Sensation: intact to LT   Reflex Exam:  DTR's:  Deep tendon reflexes in the upper and lower extremities are normal bilaterally.  Toes:  The toes are downgoing bilaterally.  Clonus:  Clonus is absent.   Assessment/Plan: 79 y.o. female here for follow up of right-sided temporal arteritis. Elevated CRP and positive biopsy. Had responded well to prednisone, but relapsed when prednisone was decreased to less than 70m. Unfortunately she has osteoporosis. May consider switching to alternative steroid-sparing treatment in the future, consider Prednisone for now at 570mdaily. Discussed extensively decreasing dose to 2.23m34maily, patient would feel better about staying at 23mg42mily. Will repeat crp and sed rate and follow up in 6 weeks.   For her hand weakness, she can wear wrist splints at night (she has them already) and we can follow up with emg/ncs in the future if she is willing. Not sure of it sounds like CTS, probably more  arthritic in nature.   Decreased sense of smell - No parkinsonian features. No previous head trauma. MRI of the brain in 2014 unremarkabke. Will follow clinically.   She had repeat VF testing, will request results  Sarina Ill, MD  [facility] [address] [place], [place] [address]  Phone [phone] Fax [phone] Lenor Coffin

## 2014-01-27 ENCOUNTER — Ambulatory Visit
Admission: RE | Admit: 2014-01-27 | Discharge: 2014-01-27 | Disposition: A | Discharge status: Home / Self Care | Payer: Medicare Other | Source: Ambulatory Visit | Attending: Endocrinology | Admitting: Endocrinology

## 2014-01-27 DIAGNOSIS — E049 Nontoxic goiter, unspecified: Secondary | ICD-10-CM

## 2014-02-18 ENCOUNTER — Encounter: Payer: Self-pay | Admitting: Neurology

## 2014-02-22 ENCOUNTER — Other Ambulatory Visit (INDEPENDENT_AMBULATORY_CARE_PROVIDER_SITE_OTHER): Payer: Self-pay

## 2014-02-22 ENCOUNTER — Other Ambulatory Visit: Payer: Self-pay | Admitting: Neurology

## 2014-02-22 ENCOUNTER — Telehealth: Payer: Self-pay | Admitting: *Deleted

## 2014-02-22 DIAGNOSIS — Z0289 Encounter for other administrative examinations: Secondary | ICD-10-CM

## 2014-02-22 DIAGNOSIS — Z79899 Other long term (current) drug therapy: Secondary | ICD-10-CM

## 2014-02-22 DIAGNOSIS — M316 Other giant cell arteritis: Secondary | ICD-10-CM

## 2014-02-22 NOTE — Telephone Encounter (Signed)
Patient wants to know if you can add a K+ to her CRP and ESR (sed rate)? Please advise

## 2014-02-23 LAB — SEDIMENTATION RATE: Sed Rate: 2 mm/hr (ref 0–40)

## 2014-02-23 LAB — HIGH SENSITIVITY CRP: CRP, High Sensitivity: 2.67 mg/L (ref 0.00–3.00)

## 2014-02-24 ENCOUNTER — Ambulatory Visit: Payer: Medicare Other | Admitting: Neurology

## 2014-02-24 LAB — BASIC METABOLIC PANEL

## 2014-02-24 LAB — MAGNESIUM

## 2014-03-01 ENCOUNTER — Other Ambulatory Visit: Payer: Self-pay | Admitting: Neurology

## 2014-03-01 DIAGNOSIS — M316 Other giant cell arteritis: Secondary | ICD-10-CM

## 2014-03-02 LAB — SPECIMEN STATUS REPORT

## 2014-03-03 ENCOUNTER — Ambulatory Visit: Payer: Self-pay | Admitting: Neurology

## 2014-03-09 LAB — BASIC METABOLIC PANEL
BUN/Creatinine Ratio: 29 — ABNORMAL HIGH (ref 11–26)
BUN: 26 mg/dL (ref 8–27)
CO2: 25 mmol/L (ref 18–29)
Calcium: 10.1 mg/dL (ref 8.7–10.3)
Chloride: 100 mmol/L (ref 97–108)
Creatinine, Ser: 0.89 mg/dL (ref 0.57–1.00)
GFR calc Af Amer: 72 mL/min/{1.73_m2} (ref >59)
GFR calc non Af Amer: 63 mL/min/{1.73_m2} (ref >59)
Glucose: 87 mg/dL (ref 65–99)
Potassium: 4.4 mmol/L (ref 3.5–5.2)
Sodium: 141 mmol/L (ref 134–144)

## 2014-03-09 LAB — MAGNESIUM: Magnesium: 2 mg/dL (ref 1.6–2.3)

## 2014-03-09 LAB — SPECIMEN STATUS REPORT

## 2014-03-30 ENCOUNTER — Other Ambulatory Visit (INDEPENDENT_AMBULATORY_CARE_PROVIDER_SITE_OTHER): Payer: Self-pay

## 2014-03-30 DIAGNOSIS — M316 Other giant cell arteritis: Secondary | ICD-10-CM

## 2014-03-30 DIAGNOSIS — Z0289 Encounter for other administrative examinations: Secondary | ICD-10-CM

## 2014-03-31 ENCOUNTER — Telehealth: Payer: Self-pay | Admitting: *Deleted

## 2014-03-31 LAB — C-REACTIVE PROTEIN: CRP: 3 mg/L (ref 0.0–4.9)

## 2014-03-31 LAB — SEDIMENTATION RATE: Sed Rate: 3 mm/hr (ref 0–40)

## 2014-03-31 NOTE — Telephone Encounter (Signed)
Left message on patients voicemail to give [place] a call back. Gave patient [place] phone number and office hours.

## 2014-04-01 ENCOUNTER — Encounter: Payer: Self-pay | Admitting: Neurology

## 2014-04-01 ENCOUNTER — Ambulatory Visit (INDEPENDENT_AMBULATORY_CARE_PROVIDER_SITE_OTHER): Payer: 59 | Admitting: Neurology

## 2014-04-01 VITALS — BP 116/69 | HR 87 | Ht 63.75 in | Wt 120.5 lb

## 2014-04-01 DIAGNOSIS — M316 Other giant cell arteritis: Secondary | ICD-10-CM

## 2014-04-01 NOTE — Progress Notes (Signed)
Talked with patient about normal CRP results in the office today. Patient verbalized understanding.

## 2014-04-01 NOTE — Progress Notes (Signed)
[facility]    Provider:  Dr Jaynee Eagles Referring Provider: Josetta Huddle, MD Primary Care Physician:  Henrine Screws, MD  CC: Temporal Arteritis  HPI: Janet Wilson is a 80 y.o. female here as a referral from Dr. Inda Merlin for Temporal Arteritis. Patient is feeling well, her vision has not changed, denies stiffness in the shoulders or hips, denies jaw claudication, no headaches; she feels well. She has surgery recently and has recovered nicely. No recurrent symptoms of temporal arteritis.  She reports that she had a head tremor in the past which seemed to have resolved. Mostly when concentrating or listening to someone, yes/no tremor. No resting tremor. Not shuffling. Lost her sense of smell. Smell is almost entirely gone. Her taste is also affected. Her hands get tired when she uses them a lot, like cooking the large dinner at [place] however denies any numbnes, tingling, shooting pain or neck pain or radicular symptoms. She has arthritis and her joints swell.   Reviewed notes, labs and imaging from outside physicians, which showed: Patient had a robotic assisted laparoscopic bilateral salpingo-oophorectomy for a complex left adnexal mass. Path showed mature teratoma without malignancy. CRP 4.60 and ESR WNL (01/11/2014)  MRi of the brian in 2014 showed mild non-specific white matter changes, unrmarkable, personally reviewed images  Review of Systems: Patient complains of symptoms per HPI as well as the following symptoms: frequency of urination. Pertinent negatives per HPI. All others negative.   Visit 11/27/2013:  HPI: Janet Wilson is a followup for Temporal Arteritis diagnosed in May/2014 after 5 weeks of temporal headaches, fatigue, weight loss, pain with chewing. MRI of the brain was negative. Started on high dose oral prednisone with good results. Biopsy confirmed arteritis. Followed by ophthamology. She was weaned off of prednisone with recurrence  of headaches and stiffnes sin the shoulders and restarted on low-dose prednisone due to increased CRP 43 and sed 15.   She is currently on 73m daily prednisone and doing well. On the 27th she has an ovarian cyst and is going to have it removed. She is feeling well. She is still getting headaches but mild and vision has been stable. Vision is good, no difficulty chewing, no migraine, gets the headaches a few times a week for a few hours in the temple areas. No muscle stiffness in the shoulder in the hips, no muscle pain.   She has osteoporosis, dexa scan pending. Understands steroids can make this worse.   Migraines improved since taking the steroids.  CRP   03/30/2014 3, 03/25/2014 2.67, 01/12/2015 5.6 ES 02/22/2014 2, 01/12/2015 4      11/2013 9//2015 08/2013 11/2012 09/2012 07/2012 06/2012 CRP 5.7 8.2 43 1.2 4.0 129 ESR _0 Review of Systems: Patient complains of symptoms per HPI as well as the following symptoms: . Pertinent negatives per HPI. All others negative.   History   Social History  . Marital Status: Married    Spouse Name: Janet Wilson . Number of Children: 2  . Years of Education: college   Occupational History  . retired    Social History Main Topics  . Smoking status: Never Smoker   . Smokeless tobacco: Never Used  . Alcohol Use: Yes     Comment: SELDOM  . Drug Use: No  . Sexual Activity: Yes   Other Topics Concern  . Not on file   Social History Narrative   Pt lives at home with family.   Caffeine Use:  Very little.    Patient is married to [place]   Patient has 2 children    Patient has a college education    Patient is right handed        Family History  Problem Relation Age of Onset  . Alzheimer's disease Mother   . Heart attack Father     Past Medical History  Diagnosis Date  . Overactive bladder   .  GERD (gastroesophageal reflux disease)   . Goiter     CAUSING COUGH, HOARSINESS AND DRY THROAT  . Arthritis     OA BOTH KNEES AND HANDS  . Glaucoma     left eye  . Temporal arteritis   . Headache(784.0)     MIGRAINES   . Atrial tachycardia     HX OF    Past Surgical History  Procedure Laterality Date  . Cervical fusion  2012  . Left shoulder surgery    . Incontinence surgery    . Right knee arthroscopy  11/2009    . Eye surgery      CATARACT EXTRACTION- BILATERAL  . Total knee arthroplasty  07/04/2011    Procedure: TOTAL KNEE ARTHROPLASTY;  Surgeon: Gearlean Alf, MD;  Location: [facility];  Service: Orthopedics;  Laterality: Right;  . Joint replacement      right knee  . Artery biopsy Right 07/21/2012    Procedure: BIOPSY TEMPORAL ARTERY;  Surgeon: Haywood Lasso, MD;  Location: [facility];  Service: General;  Laterality: Right;  . Robotic assisted bilateral salpingo oopherectomy Bilateral 12/08/2013    Procedure: ROBOTIC ASSISTED LAPAROSCOPIC BILATERAL SALPINGO OOPHORECTOMY WITH STAGING;  Surgeon: Everitt Amber, MD;  Location: [facility];  Service: Gynecology;  Laterality: Bilateral;    Current Outpatient Prescriptions  Medication Sig Dispense Refill  . aspirin EC 81 MG tablet Take 81 mg by mouth at bedtime.     . Biotin (BIOTIN 5000) 5 MG CAPS Take 1 capsule by mouth every morning.     . brinzolamide (AZOPT) 1 % ophthalmic suspension Place 1 drop into the left eye 2 (two) times daily.    . Calcium Carbonate-Vitamin D (CALCIUM PLUS VITAMIN D PO) Take 1 tablet by mouth 2 (two) times daily.     . Coenzyme Q10 (CO Q 10) 100 MG CAPS Take 1 tablet by mouth at bedtime.     . diazepam (VALIUM) 5 MG tablet Take 2.5 mg by mouth at bedtime.     Marland Kitchen estradiol (ESTRACE) 0.1 MG/GM vaginal cream Place 2 g vaginally once a week.    . fluocinonide cream (LIDEX) 0.93 % Apply 1 application topically daily as needed (marks.).     Marland Kitchen fluticasone (FLONASE) 50 MCG/ACT nasal spray Place 1 spray into both  nostrils daily as needed for allergies or rhinitis.    . Ibuprofen (ADVIL) 200 MG CAPS Take 400 mg by mouth daily as needed (pain.).    Marland Kitchen magnesium oxide (MAG-OX) 400 MG tablet Take 400 mg by mouth daily with breakfast.    . nitrofurantoin, macrocrystal-monohydrate, (MACROBID) 100 MG capsule Take 100 mg by mouth 2 (two) times daily as needed (bladder infection).    . Omega-3 Fatty Acids (FISH OIL) 1200 MG CAPS Take 1 capsule by mouth every morning.     Marland Kitchen omeprazole (PRILOSEC) 20 MG capsule Take 20 mg by mouth daily with breakfast.    . prednisoLONE 5 MG TABS tablet Take 5 mg by mouth every morning.     . predniSONE (DELTASONE) 5 MG tablet Take  1 tablet (5 mg total) by mouth daily with breakfast. 180 tablet 3  . Probiotic Product (PROBIOTIC PO) Take 1 capsule by mouth at bedtime.     Marland Kitchen zolmitriptan (ZOMIG) 5 MG tablet Take 5 mg by mouth daily as needed for migraine. May repeat after 2 hours if headache returns.    Marland Kitchen amoxicillin (AMOXIL) 875 MG tablet Take 3,500 mg by mouth. One hour prior to dental work.    Marland Kitchen HYDROmorphone (DILAUDID) 2 MG tablet Take 1 tablet (2 mg total) by mouth every 6 (six) hours as needed for severe pain. (Patient not taking: Reported on 04/01/2014) 30 tablet 0  . POTASSIUM PO Take 400 mg by mouth every morning.      No current facility-administered medications for this visit.    Allergies as of 04/01/2014 - Review Complete 04/01/2014  Allergen Reaction Noted  . Oxycodone Anaphylaxis 06/18/2012  . Codeine Nausea Only 07/11/2012    Vitals: BP 116/69 mmHg  Pulse 87  Ht 5' 3.75" (1.619 m)  Wt 120 lb 8 oz (54.658 kg)  BMI 20.85 kg/m2 Last Weight:  Wt Readings from Last 1 Encounters:  04/01/14 120 lb 8 oz (54.658 kg)   Last Height:   Ht Readings from Last 1 Encounters:  04/01/14 5' 3.75" (1.619 m)   Physical exam: Exam: Gen: NAD, conversant, well nourised,  well groomed                     CV: RRR, no MRG. No Carotid Bruits. No peripheral edema, warm,  nontender Eyes: Conjunctivae clear without exudates or hemorrhage  Neuro: Detailed Neurologic Exam  Speech:    Speech is normal; fluent and spontaneous with normal comprehension.  Cognition:    The patient is oriented to person, place, and time;     recent and remote memory intact;     language fluent;     normal attention, concentration,     fund of knowledge Cranial Nerves:    The pupils are equal, round, and reactive to light. The fundi are normal and spontaneous venous pulsations are present. Visual fields are full to finger confrontation. Extraocular movements are intact. Trigeminal sensation is intact and the muscles of mastication are normal. The face is symmetric. The palate elevates in the midline. Hearing intact. Voice is normal. Shoulder shrug is normal. The tongue has normal motion without fasciculations.    Gait:    Heel-toe and tandem gait are normal.   Motor Observation:    No asymmetry, no atrophy, and no involuntary movements noted. Tone:    Normal muscle tone.    Posture:    Posture is normal. normal erect    Strength:    Strength is V/V in the upper and lower limbs.      Sensation: intact to LT     Reflex Exam:  DTR's:    Deep tendon reflexes in the upper and lower extremities are brisk bilaterally.   Toes:    The toes are downgoing bilaterally.   .       Assessment/Plan:  Assessment/Plan: 80 y.o. female lovely patient here for follow up of right-sided temporal arteritis. Elevated CRP and positive biopsy. Had responded well to prednisone, but relapsed when prednisone was decreased to less than 70m. Unfortunately she has osteoporosis. May consider switching to alternative steroid-sparing treatment in the future, consider Prednisone for now at 552mdaily. Discussed extensively decreasing dose. Will decrease to 77m46mnd follow up in 6 weeks. If symptoms recure go back  to 23m daily and call office.  ASarina Ill MD  [facility] [address] [place] [address] Phone [phone] [phone] A total of 20 minutes was spent face-to-face with this patient. Over half this time was spent on counseling patient on the temporal arteritis diagnosis and different diagnostic and therapeutic options available.

## 2014-05-04 ENCOUNTER — Other Ambulatory Visit: Payer: Self-pay

## 2014-05-04 MED ORDER — PREDNISONE 1 MG PO TABS: 3.0000 mg | ORAL_TABLET | Freq: Every day | ORAL | Status: DC

## 2014-05-04 NOTE — Telephone Encounter (Signed)
Pharmacy said patient is requesting a new Rx for Prednisone 1mg  tabs with directions of 3mg  daily.  Last OV note says: May consider switching to alternative steroid-sparing treatment in the future, consider Prednisone for now at 5mg  daily. Discussed extensively decreasing dose. Will decrease to 3mg  and follow up in 6 weeks. If symptoms recure go back to 5mg  daily and call office.

## 2014-05-18 ENCOUNTER — Other Ambulatory Visit (INDEPENDENT_AMBULATORY_CARE_PROVIDER_SITE_OTHER): Payer: Self-pay

## 2014-05-18 DIAGNOSIS — Z0289 Encounter for other administrative examinations: Secondary | ICD-10-CM

## 2014-05-18 DIAGNOSIS — M316 Other giant cell arteritis: Secondary | ICD-10-CM

## 2014-05-19 LAB — HIGH SENSITIVITY CRP: CRP, High Sensitivity: 2.71 mg/L (ref 0.00–3.00)

## 2014-05-19 LAB — C-REACTIVE PROTEIN: CRP: 2.8 mg/L (ref 0.0–4.9)

## 2014-05-19 LAB — SEDIMENTATION RATE: Sed Rate: 5 mm/hr (ref 0–40)

## 2014-05-20 ENCOUNTER — Encounter: Payer: Self-pay | Admitting: Neurology

## 2014-05-20 ENCOUNTER — Ambulatory Visit (INDEPENDENT_AMBULATORY_CARE_PROVIDER_SITE_OTHER): Payer: Medicare Other | Admitting: Neurology

## 2014-05-20 VITALS — BP 118/78 | HR 115 | Temp 98.0°F | Ht 63.75 in | Wt 122.0 lb

## 2014-05-20 DIAGNOSIS — Z7952 Long term (current) use of systemic steroids: Secondary | ICD-10-CM | POA: Diagnosis not present

## 2014-05-20 DIAGNOSIS — M316 Other giant cell arteritis: Secondary | ICD-10-CM | POA: Diagnosis not present

## 2014-05-20 NOTE — Progress Notes (Signed)
[facility]    Provider:  Dr Jaynee Wilson Referring Provider: Josetta Huddle, Janet Wilson Primary Care Physician:  Janet Wilson, Janet Wilson  CC: Temporal Arteritis  05/20/2014: Real Cons Janet Wilson is a 80 y.o. female here as a referral from Janet. Janet Wilson for Temporal Arteritis. Patient is feeling well, her vision has not changed, denies stiffness in the shoulders or hips, denies jaw claudication, no headaches; she feels well. She has surgery recently and has recovered nicely. No recurrent symptoms of temporal arteritis.   No headaches. No vision changes. No side effects from the miedcation. No pain on eye movements. No insomnia. wiehgt staying the same. Repeat crp 2.8 and ESR 5, stable since decreasing from 31m to 343m Repeat in 6 weeks.   04/01/2014: She reports that she had a head tremor in the past which seemed to have resolved. Mostly when concentrating or listening to someone, yes/no tremor. No resting tremor. Not shuffling. Lost her sense of smell. Smell is almost entirely gone. Her taste is also affected. Her hands get tired when she uses them a lot, like cooking the large dinner at [employer] denies any numbnes, tingling, shooting pain or neck pain or radicular symptoms. She has arthritis and her joints swell.   Reviewed notes, labs and imaging from outside physicians, which showed: Patient had a robotic assisted laparoscopic bilateral salpingo-oophorectomy for a complex left adnexal mass. Path showed mature teratoma without malignancy. CRP 4.60 and ESR WNL (01/11/2014)  MRi of the brian in 2014 showed mild non-specific white matter changes, unrmarkable, personally reviewed images  Visit 11/27/2013:  HPI: Janet Wilson is a followup for Temporal Arteritis diagnosed in May/2014 after 5 weeks of temporal headaches, fatigue, weight loss, pain with chewing. MRI of the brain was negative. Started on high dose oral prednisone with good results. Biopsy confirmed arteritis.  Followed by ophthamology. She was weaned off of prednisone with recurrence of headaches and stiffnes sin the shoulders and restarted on low-dose prednisone due to increased CRP 43 and sed 15.   She is currently on 74m76maily prednisone and doing well. On the 27th she has an ovarian cyst and is going to have it removed. She is feeling well. She is still getting headaches but mild and vision has been stable. Vision is good, no difficulty chewing, no migraine, gets the headaches a few times a week for a few hours in the temple areas. No muscle stiffness in the shoulder in the hips, no muscle pain.   She has osteoporosis, dexa scan pending. Understands steroids can make this worse.   Migraines improved since taking the steroids.  CRP 03/30/2014 3, 03/25/2014 2.67, 01/12/2015 5.6 ES 02/22/2014 2, 01/12/2015 4   11/2013 9//2015 08/2013 11/2012 09/2012 07/2012 06/2012 CRP 5.7 8.2 43 1.2 4.0 129 ESR '13 9 15 3 7 6 31  ' Review of Systems: Patient complains of symptoms per HPI as well as the following symptoms: blurred vision, incontinence. Pertinent negatives per HPI. All others negative.   History   Social History  . Marital Status: Married    Spouse Name: Janet Wilson Number of Children: 2  . Years of Education: college   Occupational History  . retired    Social History Main Topics  . Smoking status: Never Smoker   . Smokeless tobacco: Never Used  . Alcohol Use: Yes     Comment: SELDOM  . Drug Use: No  . Sexual Activity: Yes   Other Topics Concern  . Not on file   Social History  Narrative   Pt lives at home with family.   Caffeine Use: Very little.    Patient is married to Janet Wilson   Patient has 2 children    Patient has a college education    Patient is right handed        Family History  Problem Relation Age of Onset  . Alzheimer's disease  Mother   . Heart attack Father     Past Medical History  Diagnosis Date  . Overactive bladder   . GERD (gastroesophageal reflux disease)   . Goiter     CAUSING COUGH, HOARSINESS AND DRY THROAT  . Arthritis     OA BOTH KNEES AND HANDS  . Glaucoma     left eye  . Temporal arteritis   . Headache(784.0)     MIGRAINES   . Atrial tachycardia     HX OF    Past Surgical History  Procedure Laterality Date  . Cervical fusion  2012  . Left shoulder surgery    . Incontinence surgery    . Right knee arthroscopy  11/2009    . Eye surgery      CATARACT EXTRACTION- BILATERAL  . Total knee arthroplasty  07/04/2011    Procedure: TOTAL KNEE ARTHROPLASTY;  Surgeon: Gearlean Alf, Janet Wilson;  Location: [facility];  Service: Orthopedics;  Laterality: Right;  . Joint replacement      right knee  . Artery biopsy Right 07/21/2012    Procedure: BIOPSY TEMPORAL ARTERY;  Surgeon: Haywood Lasso, Janet Wilson;  Location: [place];  Service: General;  Laterality: Right;  . Robotic assisted bilateral salpingo oopherectomy Bilateral 12/08/2013    Procedure: ROBOTIC ASSISTED LAPAROSCOPIC BILATERAL SALPINGO OOPHORECTOMY WITH STAGING;  Surgeon: Everitt Amber, Janet Wilson;  Location: [facility];  Service: Gynecology;  Laterality: Bilateral;    Current Outpatient Prescriptions  Medication Sig Dispense Refill  . amoxicillin (AMOXIL) 875 MG tablet Take 3,500 mg by mouth. One hour prior to dental work.    Marland Kitchen aspirin EC 81 MG tablet Take 81 mg by mouth at bedtime.     . Biotin (BIOTIN 5000) 5 MG CAPS Take 1 capsule by mouth every morning.     . brinzolamide (AZOPT) 1 % ophthalmic suspension Place 1 drop into the left eye 2 (two) times daily.    . Calcium Carbonate-Vitamin D (CALCIUM PLUS VITAMIN D PO) Take 1 tablet by mouth 2 (two) times daily.     . Coenzyme Q10 (CO Q 10) 100 MG CAPS Take 1 tablet by mouth at bedtime.     . diazepam (VALIUM) 5 MG tablet Take 2.5 mg by mouth at bedtime.     Marland Kitchen estradiol (ESTRACE) 0.1 MG/GM vaginal cream Place 2 g  vaginally once a week.    . fluocinonide cream (LIDEX) 7.85 % Apply 1 application topically daily as needed (marks.).     Marland Kitchen fluticasone (FLONASE) 50 MCG/ACT nasal spray Place 1 spray into both nostrils daily as needed for allergies or rhinitis.    . Ibuprofen (ADVIL) 200 MG CAPS Take 400 mg by mouth daily as needed (pain.).    Marland Kitchen magnesium oxide (MAG-OX) 400 MG tablet Take 400 mg by mouth daily with breakfast.    . nitrofurantoin, macrocrystal-monohydrate, (MACROBID) 100 MG capsule Take 100 mg by mouth 2 (two) times daily as needed (bladder infection).    . Omega-3 Fatty Acids (FISH OIL) 1200 MG CAPS Take 1 capsule by mouth every morning.     Marland Kitchen omeprazole (PRILOSEC) 20 MG capsule Take 20  mg by mouth daily with breakfast.    . POTASSIUM PO Take 400 mg by mouth every morning.     . predniSONE (DELTASONE) 1 MG tablet Take 3 tablets (3 mg total) by mouth daily. As directed 90 tablet 1  . Probiotic Product (PROBIOTIC PO) Take 1 capsule by mouth at bedtime.     Marland Kitchen zolmitriptan (ZOMIG) 5 MG tablet Take 5 mg by mouth daily as needed for migraine. May repeat after 2 hours if headache returns.    Marland Kitchen HYDROmorphone (DILAUDID) 2 MG tablet Take 1 tablet (2 mg total) by mouth every 6 (six) hours as needed for severe pain. (Patient not taking: Reported on 04/01/2014) 30 tablet 0  . triamcinolone cream (KENALOG) 0.1 % Apply 1 application topically as needed.     No current facility-administered medications for this visit.    Allergies as of 05/20/2014 - Review Complete 05/20/2014  Allergen Reaction Noted  . Oxycodone Anaphylaxis 06/18/2012  . Codeine Nausea Only 07/11/2012    Vitals: BP 118/78 mmHg  Pulse 115  Temp(Src) 98 F (36.7 C)  Ht 5' 3.75" (1.619 m)  Wt 122 lb (55.339 kg)  BMI 21.11 kg/m2 Last Weight:  Wt Readings from Last 1 Encounters:  05/20/14 122 lb (55.339 kg)   Last Height:   Ht Readings from Last 1 Encounters:  05/20/14 5' 3.75" (1.619 m)    Cranial Nerves:  The pupils are  equal, round, and reactive to light. The fundi are normal and spontaneous venous pulsations are present. Visual fields are full to finger confrontation. Extraocular movements are intact. Trigeminal sensation is intact and the muscles of mastication are normal. The face is symmetric. The palate elevates in the midline. Hearing intact. Voice is normal. Shoulder shrug is normal. The tongue has normal motion without fasciculations.     Assessment/Plan: 80 y.o. female lovely patient here for follow up of right-sided temporal arteritis. Elevated CRP and positive biopsy. Had responded well to prednisone, but relapsed when prednisone was decreased to less than 28m. Unfortunately she has osteoporosis. May consider switching to alternative steroid-sparing treatment in the future. Repeat CRP and ESR are stable since decreasing from 547mto 82m72mWill continue to 82mg62md follow up in 6 weeks. If symptoms recur go back to 5mg 54mly and call office.Discussed the possibility of diabetes on long-term steroids. Will check hgba1c.   [facility] [address] [id number] [phone] of 30 minutes was spent face-to-face with this patient. Over half this time was spent on counseling patient on the Temporal Arteritis diagnosis and different diagnostic and therapeutic options available.

## 2014-05-21 ENCOUNTER — Other Ambulatory Visit (INDEPENDENT_AMBULATORY_CARE_PROVIDER_SITE_OTHER): Payer: Self-pay

## 2014-05-21 DIAGNOSIS — Z7952 Long term (current) use of systemic steroids: Secondary | ICD-10-CM

## 2014-05-21 DIAGNOSIS — Z0289 Encounter for other administrative examinations: Secondary | ICD-10-CM

## 2014-05-21 DIAGNOSIS — M316 Other giant cell arteritis: Secondary | ICD-10-CM

## 2014-05-22 LAB — HIGH SENSITIVITY CRP: CRP, High Sensitivity: 2.97 mg/L (ref 0.00–3.00)

## 2014-05-22 LAB — SEDIMENTATION RATE: Sed Rate: 4 mm/hr (ref 0–40)

## 2014-05-22 LAB — C-REACTIVE PROTEIN: CRP: 3.1 mg/L (ref 0.0–4.9)

## 2014-05-22 LAB — HEMOGLOBIN A1C
Est. average glucose Bld gHb Est-mCnc: 108 mg/dL
Hgb A1c MFr Bld: 5.4 % (ref 4.8–5.6)

## 2014-06-07 ENCOUNTER — Other Ambulatory Visit: Payer: Self-pay | Admitting: Neurology

## 2014-06-07 DIAGNOSIS — M316 Other giant cell arteritis: Secondary | ICD-10-CM

## 2014-07-02 ENCOUNTER — Other Ambulatory Visit: Payer: Self-pay | Admitting: Neurology

## 2014-07-13 ENCOUNTER — Other Ambulatory Visit (INDEPENDENT_AMBULATORY_CARE_PROVIDER_SITE_OTHER): Payer: Self-pay

## 2014-07-13 DIAGNOSIS — Z0289 Encounter for other administrative examinations: Secondary | ICD-10-CM

## 2014-07-13 NOTE — Addendum Note (Signed)
Addended by: Margorie John on: 07/13/2014 10:46 AM   Modules accepted: Orders

## 2014-07-14 ENCOUNTER — Telehealth: Payer: Self-pay | Admitting: *Deleted

## 2014-07-14 LAB — HIGH SENSITIVITY CRP: CRP, High Sensitivity: 2.66 mg/L (ref 0.00–3.00)

## 2014-07-14 LAB — C-REACTIVE PROTEIN: CRP: 2.8 mg/L (ref 0.0–4.9)

## 2014-07-14 LAB — SEDIMENTATION RATE: Sed Rate: 4 mm/hr (ref 0–40)

## 2014-07-14 NOTE — Telephone Encounter (Signed)
Spoke with pt and told her about lab results. Sedimentation rate of 4, which was the same from previous lab results and a CRP of 2.8 which is better than last result of 3.1. I told her she does not need to come for appt tomorrow and Dr. Jaynee Eagles said we can schedule appt for 8 weeks from now to follow-up. Told her to call back if anything changes or she has questions. Made appt for September 08, 2014 at 11:15am and told pt to arrive at 11:00am. Pt verbalized understanding.

## 2014-07-20 ENCOUNTER — Ambulatory Visit: Payer: Medicare Other | Admitting: Neurology

## 2014-08-09 ENCOUNTER — Other Ambulatory Visit: Payer: Self-pay

## 2014-08-09 DIAGNOSIS — H5111 Convergence insufficiency: Secondary | ICD-10-CM | POA: Insufficient documentation

## 2014-09-03 ENCOUNTER — Other Ambulatory Visit (INDEPENDENT_AMBULATORY_CARE_PROVIDER_SITE_OTHER): Payer: Self-pay

## 2014-09-03 ENCOUNTER — Telehealth: Payer: Self-pay | Admitting: *Deleted

## 2014-09-03 ENCOUNTER — Other Ambulatory Visit: Payer: Self-pay | Admitting: Neurology

## 2014-09-03 DIAGNOSIS — Z0289 Encounter for other administrative examinations: Secondary | ICD-10-CM

## 2014-09-03 NOTE — Telephone Encounter (Signed)
Has appt scheduled.

## 2014-09-06 ENCOUNTER — Other Ambulatory Visit: Payer: Self-pay | Admitting: Neurology

## 2014-09-06 ENCOUNTER — Other Ambulatory Visit (INDEPENDENT_AMBULATORY_CARE_PROVIDER_SITE_OTHER): Payer: Self-pay

## 2014-09-06 DIAGNOSIS — Z0289 Encounter for other administrative examinations: Secondary | ICD-10-CM

## 2014-09-06 DIAGNOSIS — M316 Other giant cell arteritis: Secondary | ICD-10-CM

## 2014-09-06 NOTE — Telephone Encounter (Signed)
Orders placed.

## 2014-09-07 ENCOUNTER — Other Ambulatory Visit: Payer: Self-pay | Admitting: Neurology

## 2014-09-07 ENCOUNTER — Telehealth: Payer: Self-pay | Admitting: Neurology

## 2014-09-07 DIAGNOSIS — M316 Other giant cell arteritis: Secondary | ICD-10-CM

## 2014-09-07 LAB — C-REACTIVE PROTEIN: CRP: 3.5 mg/L (ref 0.0–4.9)

## 2014-09-07 LAB — SEDIMENTATION RATE: Sed Rate: 8 mm/hr (ref 0–40)

## 2014-09-07 MED ORDER — PREDNISONE 1 MG PO TABS: ORAL_TABLET | ORAL | Status: DC

## 2014-09-07 NOTE — Telephone Encounter (Signed)
Dr. Jaynee Eagles, Sed rate and CRP were normal. Pt wondering if you need her to still see you or if her husband can take her appt next week? She just had labs done yesterday. Thank you!

## 2014-09-07 NOTE — Telephone Encounter (Signed)
Spoke to patient crp 3.5 and sed rate 8.

## 2014-09-07 NOTE — Telephone Encounter (Signed)
I spoke to patient about results. Don't think she wants to come for eher appointment. Would you cancel it? thanks

## 2014-09-08 ENCOUNTER — Ambulatory Visit: Payer: Self-pay | Admitting: Neurology

## 2014-09-08 ENCOUNTER — Encounter: Payer: Self-pay | Admitting: *Deleted

## 2014-09-08 NOTE — Telephone Encounter (Signed)
Dr Ahern--Cancelled her appt and put her husband in her place. Did husband still want appt?

## 2014-09-22 ENCOUNTER — Other Ambulatory Visit: Payer: Self-pay | Admitting: Gynecology

## 2014-09-23 LAB — CYTOLOGY - PAP

## 2014-10-15 ENCOUNTER — Encounter: Payer: Self-pay | Admitting: Neurology

## 2014-10-19 ENCOUNTER — Other Ambulatory Visit: Payer: Self-pay | Admitting: Neurology

## 2014-10-19 DIAGNOSIS — M316 Other giant cell arteritis: Secondary | ICD-10-CM

## 2014-10-21 ENCOUNTER — Other Ambulatory Visit (INDEPENDENT_AMBULATORY_CARE_PROVIDER_SITE_OTHER): Payer: Self-pay

## 2014-10-21 DIAGNOSIS — Z0289 Encounter for other administrative examinations: Secondary | ICD-10-CM

## 2014-10-21 DIAGNOSIS — M316 Other giant cell arteritis: Secondary | ICD-10-CM

## 2014-10-22 LAB — C-REACTIVE PROTEIN: CRP: 2 mg/L (ref 0.0–4.9)

## 2014-10-22 LAB — SEDIMENTATION RATE: Sed Rate: 7 mm/hr (ref 0–40)

## 2014-10-25 ENCOUNTER — Telehealth: Payer: Self-pay | Admitting: Neurology

## 2014-10-25 NOTE — Telephone Encounter (Signed)
Spoke to patient and her husband. Husband has not had anymore seizure-like events. Patient's crp and sed rate were well within normal limits. thanks

## 2014-11-30 ENCOUNTER — Encounter (HOSPITAL_BASED_OUTPATIENT_CLINIC_OR_DEPARTMENT_OTHER): Payer: Medicare Other | Attending: General Surgery

## 2014-11-30 DIAGNOSIS — M19042 Primary osteoarthritis, left hand: Secondary | ICD-10-CM | POA: Diagnosis not present

## 2014-11-30 DIAGNOSIS — H409 Unspecified glaucoma: Secondary | ICD-10-CM | POA: Insufficient documentation

## 2014-11-30 DIAGNOSIS — Z9849 Cataract extraction status, unspecified eye: Secondary | ICD-10-CM | POA: Diagnosis not present

## 2014-11-30 DIAGNOSIS — X58XXXA Exposure to other specified factors, initial encounter: Secondary | ICD-10-CM | POA: Insufficient documentation

## 2014-11-30 DIAGNOSIS — M19041 Primary osteoarthritis, right hand: Secondary | ICD-10-CM | POA: Insufficient documentation

## 2014-11-30 DIAGNOSIS — S81802A Unspecified open wound, left lower leg, initial encounter: Secondary | ICD-10-CM | POA: Diagnosis present

## 2014-11-30 DIAGNOSIS — M479 Spondylosis, unspecified: Secondary | ICD-10-CM | POA: Insufficient documentation

## 2014-12-07 DIAGNOSIS — M19041 Primary osteoarthritis, right hand: Secondary | ICD-10-CM | POA: Diagnosis not present

## 2014-12-07 DIAGNOSIS — M19042 Primary osteoarthritis, left hand: Secondary | ICD-10-CM | POA: Diagnosis not present

## 2014-12-07 DIAGNOSIS — S81802A Unspecified open wound, left lower leg, initial encounter: Secondary | ICD-10-CM | POA: Diagnosis not present

## 2014-12-07 DIAGNOSIS — Z9849 Cataract extraction status, unspecified eye: Secondary | ICD-10-CM | POA: Diagnosis not present

## 2014-12-09 ENCOUNTER — Emergency Department (HOSPITAL_COMMUNITY)
Admission: EM | Admit: 2014-12-09 | Discharge: 2014-12-09 | Discharge status: Against Medical Advice | Payer: No Typology Code available for payment source | Attending: Emergency Medicine | Admitting: Emergency Medicine

## 2014-12-09 ENCOUNTER — Encounter (HOSPITAL_COMMUNITY): Payer: Self-pay | Admitting: Emergency Medicine

## 2014-12-09 DIAGNOSIS — S6992XA Unspecified injury of left wrist, hand and finger(s), initial encounter: Secondary | ICD-10-CM | POA: Diagnosis not present

## 2014-12-09 DIAGNOSIS — Y998 Other external cause status: Secondary | ICD-10-CM | POA: Diagnosis not present

## 2014-12-09 DIAGNOSIS — Y92410 Unspecified street and highway as the place of occurrence of the external cause: Secondary | ICD-10-CM | POA: Insufficient documentation

## 2014-12-09 DIAGNOSIS — S199XXA Unspecified injury of neck, initial encounter: Secondary | ICD-10-CM | POA: Insufficient documentation

## 2014-12-09 DIAGNOSIS — Y9389 Activity, other specified: Secondary | ICD-10-CM | POA: Insufficient documentation

## 2014-12-09 NOTE — ED Notes (Signed)
Pt left ama, states she cannot wait any longer.
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
## 2014-12-10 ENCOUNTER — Emergency Department (HOSPITAL_COMMUNITY)
Admission: EM | Admit: 2014-12-10 | Discharge: 2014-12-10 | Disposition: A | Discharge status: Home / Self Care | Payer: No Typology Code available for payment source | Attending: Emergency Medicine | Admitting: Emergency Medicine

## 2014-12-10 ENCOUNTER — Encounter (HOSPITAL_COMMUNITY): Payer: Self-pay | Admitting: Emergency Medicine

## 2014-12-10 ENCOUNTER — Emergency Department (HOSPITAL_COMMUNITY): Payer: No Typology Code available for payment source

## 2014-12-10 DIAGNOSIS — H409 Unspecified glaucoma: Secondary | ICD-10-CM | POA: Insufficient documentation

## 2014-12-10 DIAGNOSIS — Z8639 Personal history of other endocrine, nutritional and metabolic disease: Secondary | ICD-10-CM | POA: Insufficient documentation

## 2014-12-10 DIAGNOSIS — K219 Gastro-esophageal reflux disease without esophagitis: Secondary | ICD-10-CM | POA: Insufficient documentation

## 2014-12-10 DIAGNOSIS — Y998 Other external cause status: Secondary | ICD-10-CM | POA: Diagnosis not present

## 2014-12-10 DIAGNOSIS — S62637A Displaced fracture of distal phalanx of left little finger, initial encounter for closed fracture: Secondary | ICD-10-CM | POA: Insufficient documentation

## 2014-12-10 DIAGNOSIS — R52 Pain, unspecified: Secondary | ICD-10-CM

## 2014-12-10 DIAGNOSIS — S299XXA Unspecified injury of thorax, initial encounter: Secondary | ICD-10-CM | POA: Diagnosis not present

## 2014-12-10 DIAGNOSIS — Z8679 Personal history of other diseases of the circulatory system: Secondary | ICD-10-CM | POA: Diagnosis not present

## 2014-12-10 DIAGNOSIS — Z87448 Personal history of other diseases of urinary system: Secondary | ICD-10-CM | POA: Diagnosis not present

## 2014-12-10 DIAGNOSIS — Z7951 Long term (current) use of inhaled steroids: Secondary | ICD-10-CM | POA: Diagnosis not present

## 2014-12-10 DIAGNOSIS — Y92410 Unspecified street and highway as the place of occurrence of the external cause: Secondary | ICD-10-CM | POA: Diagnosis not present

## 2014-12-10 DIAGNOSIS — G43909 Migraine, unspecified, not intractable, without status migrainosus: Secondary | ICD-10-CM | POA: Insufficient documentation

## 2014-12-10 DIAGNOSIS — Y9389 Activity, other specified: Secondary | ICD-10-CM | POA: Insufficient documentation

## 2014-12-10 DIAGNOSIS — S6992XA Unspecified injury of left wrist, hand and finger(s), initial encounter: Secondary | ICD-10-CM | POA: Diagnosis present

## 2014-12-10 DIAGNOSIS — M158 Other polyosteoarthritis: Secondary | ICD-10-CM | POA: Diagnosis not present

## 2014-12-10 DIAGNOSIS — Z7982 Long term (current) use of aspirin: Secondary | ICD-10-CM | POA: Insufficient documentation

## 2014-12-10 DIAGNOSIS — Z7952 Long term (current) use of systemic steroids: Secondary | ICD-10-CM | POA: Diagnosis not present

## 2014-12-10 DIAGNOSIS — Z79899 Other long term (current) drug therapy: Secondary | ICD-10-CM | POA: Diagnosis not present

## 2014-12-10 DIAGNOSIS — S199XXA Unspecified injury of neck, initial encounter: Secondary | ICD-10-CM | POA: Diagnosis not present
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
## 2014-12-14 ENCOUNTER — Encounter (HOSPITAL_BASED_OUTPATIENT_CLINIC_OR_DEPARTMENT_OTHER): Payer: Medicare Other | Attending: General Surgery

## 2014-12-14 DIAGNOSIS — S8011XA Contusion of right lower leg, initial encounter: Secondary | ICD-10-CM | POA: Diagnosis present

## 2014-12-14 DIAGNOSIS — Z7952 Long term (current) use of systemic steroids: Secondary | ICD-10-CM | POA: Diagnosis not present

## 2014-12-14 DIAGNOSIS — M316 Other giant cell arteritis: Secondary | ICD-10-CM | POA: Diagnosis not present

## 2014-12-14 DIAGNOSIS — M1990 Unspecified osteoarthritis, unspecified site: Secondary | ICD-10-CM | POA: Diagnosis not present

## 2014-12-14 DIAGNOSIS — X58XXXA Exposure to other specified factors, initial encounter: Secondary | ICD-10-CM | POA: Diagnosis not present

## 2014-12-14 DIAGNOSIS — H409 Unspecified glaucoma: Secondary | ICD-10-CM | POA: Diagnosis not present

## 2015-01-04 ENCOUNTER — Telehealth: Payer: Self-pay | Admitting: Neurology

## 2015-01-04 ENCOUNTER — Other Ambulatory Visit: Payer: Self-pay | Admitting: *Deleted

## 2015-01-04 DIAGNOSIS — E049 Nontoxic goiter, unspecified: Secondary | ICD-10-CM

## 2015-01-04 DIAGNOSIS — M316 Other giant cell arteritis: Secondary | ICD-10-CM

## 2015-01-04 NOTE — Telephone Encounter (Signed)
I have spoken with Janet Wilson and per AA, advised ok to come in for [facility] tomorrow, and if labs are normal, she may cancel her pending appt. with AA. Pt. also requested thyroid labs.  per AA, thyroid panel added to labwork/fim

## 2015-01-04 NOTE — Telephone Encounter (Signed)
Pt called to make appt, Nov. 28th at 11am. She would like to come in before hand to have lab work done. She states the lab work will dictate if she needs to keep the appt or not. She would like to come in on Wednesday Nov. 23rd for the Lab work. Please all and advise [phone].

## 2015-01-05 ENCOUNTER — Other Ambulatory Visit (INDEPENDENT_AMBULATORY_CARE_PROVIDER_SITE_OTHER): Payer: Self-pay

## 2015-01-05 DIAGNOSIS — E049 Nontoxic goiter, unspecified: Secondary | ICD-10-CM

## 2015-01-05 DIAGNOSIS — M316 Other giant cell arteritis: Secondary | ICD-10-CM

## 2015-01-05 DIAGNOSIS — Z0289 Encounter for other administrative examinations: Secondary | ICD-10-CM

## 2015-01-06 LAB — THYROID PANEL WITH TSH
Free Thyroxine Index: 1.9 (ref 1.2–4.9)
T3 Uptake Ratio: 30 % (ref 24–39)
T4, Total: 6.2 ug/dL (ref 4.5–12.0)
TSH: 1.29 u[IU]/mL (ref 0.450–4.500)

## 2015-01-06 LAB — SEDIMENTATION RATE: Sed Rate: 2 mm/hr (ref 0–40)

## 2015-01-06 LAB — C-REACTIVE PROTEIN: CRP: 2.3 mg/L (ref 0.0–4.9)

## 2015-01-10 ENCOUNTER — Telehealth: Payer: Self-pay | Admitting: *Deleted

## 2015-01-10 ENCOUNTER — Ambulatory Visit: Payer: Medicare Other | Admitting: Neurology

## 2015-01-10 NOTE — Telephone Encounter (Signed)
LVM for pt to call back. Let her know lab work looked excellent per Dr Jaynee Eagles and it is up to her if she still wants to come for her appt today.

## 2015-01-10 NOTE — Telephone Encounter (Signed)
Tried home number. Line busy.

## 2015-01-10 NOTE — Telephone Encounter (Signed)
Spoke w. Pt about normal labs per Dr Jaynee Eagles. Cx appt today and r/s husband to today at 1100am. Check in 1045am. Cx husband appt for tomorrow.

## 2015-03-15 ENCOUNTER — Other Ambulatory Visit: Payer: Self-pay | Admitting: Internal Medicine

## 2015-03-15 ENCOUNTER — Ambulatory Visit
Admission: RE | Admit: 2015-03-15 | Discharge: 2015-03-15 | Disposition: A | Discharge status: Home / Self Care | Payer: PPO | Source: Ambulatory Visit | Attending: Internal Medicine | Admitting: Internal Medicine

## 2015-03-15 DIAGNOSIS — M25512 Pain in left shoulder: Secondary | ICD-10-CM

## 2015-03-21 ENCOUNTER — Other Ambulatory Visit: Payer: Self-pay | Admitting: Neurology

## 2015-03-21 ENCOUNTER — Telehealth: Payer: Self-pay | Admitting: Neurology

## 2015-03-21 DIAGNOSIS — M316 Other giant cell arteritis: Secondary | ICD-10-CM

## 2015-03-21 NOTE — Telephone Encounter (Signed)
It is ordered thanks 

## 2015-03-21 NOTE — Telephone Encounter (Signed)
Called pt back. She would like CRP lab order placed so she can come tomorrow for labs. That way results may come back in time to see if she needs to come in for appt on 2/8. Advised I will speak to Dr Jaynee Eagles and if okay to place labs, I will do so. If not, I will call her back. Pt verbalized understanding.

## 2015-03-21 NOTE — Telephone Encounter (Signed)
Pt scheduled appt for 03/23/15 f/u. She said she will need labs tomorrow??

## 2015-03-22 ENCOUNTER — Other Ambulatory Visit (INDEPENDENT_AMBULATORY_CARE_PROVIDER_SITE_OTHER): Payer: Self-pay

## 2015-03-22 DIAGNOSIS — M316 Other giant cell arteritis: Secondary | ICD-10-CM

## 2015-03-22 DIAGNOSIS — Z0289 Encounter for other administrative examinations: Secondary | ICD-10-CM

## 2015-03-23 ENCOUNTER — Telehealth: Payer: Self-pay | Admitting: *Deleted

## 2015-03-23 ENCOUNTER — Ambulatory Visit: Payer: Medicare Other | Admitting: Neurology

## 2015-03-23 LAB — C-REACTIVE PROTEIN: CRP: 1.8 mg/L (ref 0.0–4.9)

## 2015-03-23 LAB — SEDIMENTATION RATE: Sed Rate: 5 mm/hr (ref 0–40)

## 2015-03-23 NOTE — Telephone Encounter (Signed)
-----   Message from Melvenia Beam, MD sent at 03/23/2015  8:00 AM EST ----- CRp is better (2.3 to 1.8). Sed rate looks great (was 8 6 months ago then 7 then 2 now 5 - all within normal variation ranges).

## 2015-03-23 NOTE — Telephone Encounter (Signed)
Called and spoke to pt. Relayed ok to decrease prednisone back to 2 tablets per day per Dr Jaynee Eagles. Pt verbalized understanding.

## 2015-03-23 NOTE — Telephone Encounter (Signed)
Called pt and relayed results per Dr Jaynee Eagles note. She verbalized understanding. Pt states she went from taking 3 to 2.5 tablets. She said Dr Jaynee Eagles is aware. She is wondering if she can decrease again to 2 tablets. I told her I will speak to Dr Jaynee Eagles and call her back to advise. She verbalized understanding.

## 2015-05-14 ENCOUNTER — Encounter: Payer: Self-pay | Admitting: Neurology

## 2015-05-16 ENCOUNTER — Other Ambulatory Visit: Payer: Self-pay | Admitting: Neurology

## 2015-05-16 DIAGNOSIS — M316 Other giant cell arteritis: Secondary | ICD-10-CM

## 2015-06-01 ENCOUNTER — Other Ambulatory Visit (INDEPENDENT_AMBULATORY_CARE_PROVIDER_SITE_OTHER): Payer: Self-pay

## 2015-06-01 DIAGNOSIS — Z0289 Encounter for other administrative examinations: Secondary | ICD-10-CM

## 2015-06-01 DIAGNOSIS — M316 Other giant cell arteritis: Secondary | ICD-10-CM

## 2015-06-02 ENCOUNTER — Telehealth: Payer: Self-pay | Admitting: *Deleted

## 2015-06-02 LAB — C-REACTIVE PROTEIN: CRP: 2.7 mg/L (ref 0.0–4.9)

## 2015-06-02 LAB — SEDIMENTATION RATE: Sed Rate: 2 mm/hr (ref 0–40)

## 2015-06-02 NOTE — Telephone Encounter (Signed)
We close at noon tomorrow and I am booked but happy to double book her tomorrow. I can double book her at noon tomorrow. She may have to wait until 1230 but our lobby closes at 12 so she has to get here before then. Next week I am at the [facility]. Otherwise I can see her anytime the following week, can squeeze her in at anytime I don't have a patient (noon or 1230 or 4 or 430). Thanks!

## 2015-06-02 NOTE — Telephone Encounter (Signed)
Dr Jaynee Eagles- do you want to fit her in somewhere? Please advise

## 2015-06-02 NOTE — Telephone Encounter (Signed)
Called pt back. Made appt 06/15/15 at 4pm, check in 345pm. Offered tomorrow at 12pm. She declined. Pt husband has appt at 11am and she will not be able to be here. She states she is ok to wait until first week of May.

## 2015-06-02 NOTE — Telephone Encounter (Signed)
Spoke to pt about lab results per Dr Jaynee Eagles note. Pt verbalized understanding. She states she may call back if pain in neck comes back and becomes too unbearable. She stated Dr Jaynee Eagles mentioned doing injections.

## 2015-06-02 NOTE — Telephone Encounter (Signed)
Patient called back to request appointment for pain in neck at base of head. Please call [phone].

## 2015-06-02 NOTE — Telephone Encounter (Signed)
LVM for pt to call about results. Gave [facility] phone number.  

## 2015-06-02 NOTE — Telephone Encounter (Signed)
Pt returned Emma's call. Janet Wilson was in the phone room and spoke to patient.

## 2015-06-02 NOTE — Telephone Encounter (Signed)
-----   Message from Melvenia Beam, MD sent at 06/02/2015 10:17 AM EDT ----- Sed and crp are awesome Sed rate is 2 and crp is 2.7. VERY low! thanks

## 2015-06-15 ENCOUNTER — Ambulatory Visit (INDEPENDENT_AMBULATORY_CARE_PROVIDER_SITE_OTHER): Payer: PPO | Admitting: Neurology

## 2015-06-15 VITALS — BP 136/73 | HR 81 | Ht 63.75 in | Wt 120.4 lb

## 2015-06-15 DIAGNOSIS — M316 Other giant cell arteritis: Secondary | ICD-10-CM | POA: Diagnosis not present

## 2015-06-15 DIAGNOSIS — M5481 Occipital neuralgia: Secondary | ICD-10-CM | POA: Diagnosis not present

## 2015-06-15 NOTE — Progress Notes (Signed)
Nerve Block: Lidocaine 2%-52mL Lot: [id number] Expiration 12/2018 NDC: [id number]  Depo-medrol 80mg /mLx 2 vials Lot: [id number] Expiration: 03/2016 NDC: [id number]  Marcaine 0.5%- 27mL Lot: [id number] Expiration: 09/12/2016 NDC: [id number]

## 2015-06-16 DIAGNOSIS — M5481 Occipital neuralgia: Secondary | ICD-10-CM | POA: Insufficient documentation

## 2015-06-16 NOTE — Progress Notes (Signed)
    NERVE BLOCK PROCEDURE NOTE  History:   Procedure: Patient was consented for bilateral occipital nerve blocks. A solution containing the below ingredients was prepared in 2 3-CC syringes with 27 gauge 1/2 inch needle.   6 Target areas in the occipital and suboccipital regions bilaterally were identified via palpation and pain response.The sites junctions were sterilized with alcohol wipes. 20ml was injected at each site. The contents of each syringe was injected in a fanlike fashion. The headache improved from 6/10 to 1/10. Patient tolerated the procedure well and no complications were noted.    Consent was provided below and patient acknowledged understanding:   Nerve Block:  Lidocaine 2%-66mL  Lot: [id number]  Expiration 12/2018  NDC: [id number]  Depo-medrol 80mg /mLx 2 vials  Lot: [id number]  Expiration: 03/2016  NDC: [id number]  Marcaine 0.5%- 68mL  Lot: [id number]  Expiration: 09/12/2016  NDC: [id number]    What to expect afterwards?  Immediately after the injection, the back of your head may feel warm and numb. You may also experience reduction in the pain. The local anaesthetic wears off in a few hours and the steroid usually takes  3-7 days to take effect.   The pain relief is vary variable and can last from a few days to several months. Some patients do not experience any pain relief. Hence it is difficult to predict the outcome of the injection treatment in a particular patient.   There may be some discomfort at the injection site for a couple of days after treatment, however, this should settle quite quickly. We advise you to take things easy for the rest of the day. Continue taking your pain medication as advised by your consultant or until you feel benefit from the treatment.   What are the side effects / complications?  Common   Soreness / bruising at the injection site.   Temporary increase (up to 7 days) in pain following procedure.   Rare   Bleeding    Infection at the injection site   Allergic reaction   New pain   Worsening pain

## 2015-06-24 ENCOUNTER — Telehealth: Payer: Self-pay | Admitting: Neurology

## 2015-06-24 NOTE — Telephone Encounter (Signed)
Patient called to advise, since injection, "head has been sore and headachy".

## 2015-06-27 ENCOUNTER — Other Ambulatory Visit (INDEPENDENT_AMBULATORY_CARE_PROVIDER_SITE_OTHER): Payer: Self-pay

## 2015-06-27 DIAGNOSIS — M316 Other giant cell arteritis: Secondary | ICD-10-CM

## 2015-06-27 DIAGNOSIS — Z0289 Encounter for other administrative examinations: Secondary | ICD-10-CM

## 2015-06-27 DIAGNOSIS — M5481 Occipital neuralgia: Secondary | ICD-10-CM

## 2015-06-27 NOTE — Telephone Encounter (Signed)
Dr Jaynee Eagles- FYI  Called pt back. She stated she had taken the last of the  Prednisone and was getting worried d/t her head being sore/headache. She states she has been feeling better since Friday. I offered to make appt this week with Dr Jaynee Eagles, but she declined. She stated she got her labwork this morning. I advised her to call back if she has worsening or new sx. She verbalized understanding.

## 2015-06-27 NOTE — Telephone Encounter (Signed)
It can take a few weeks for that to go away. Would you see how she feels? If she is still feeling bad I can see her int he office to discuss.

## 2015-06-28 ENCOUNTER — Telehealth: Payer: Self-pay | Admitting: *Deleted

## 2015-06-28 LAB — SEDIMENTATION RATE: Sed Rate: 2 mm/hr (ref 0–40)

## 2015-06-28 LAB — C-REACTIVE PROTEIN: CRP: 3 mg/L (ref 0.0–4.9)

## 2015-06-28 NOTE — Telephone Encounter (Signed)
Called and spoke to pt about lab results per Dr Jaynee Eagles note. Pt verbalized understanding. She states she is feeling better. Told her to call if anything changes.

## 2015-06-28 NOTE — Telephone Encounter (Signed)
-----   Message from Melvenia Beam, MD sent at 06/28/2015 10:08 AM EDT ----- CRP and sed reate excellent !!!

## 2015-07-18 ENCOUNTER — Telehealth: Payer: Self-pay | Admitting: *Deleted

## 2015-07-20 ENCOUNTER — Telehealth: Payer: Self-pay | Admitting: Neurology

## 2015-07-20 NOTE — Telephone Encounter (Signed)
Dr Ahern- see below. Are you ok with this?  

## 2015-07-20 NOTE — Telephone Encounter (Signed)
We can schedule her sooner, she can have a new patient spot as can her husband thanks

## 2015-07-20 NOTE — Telephone Encounter (Signed)
F/u for neurcognitive testing.Scheduled for 08/02/15 at 100pm, check in no later than 1245pm. She verbalized understanding.

## 2015-07-20 NOTE — Telephone Encounter (Signed)
Cx appt made on 09/13/15

## 2015-07-20 NOTE — Telephone Encounter (Signed)
Pt called request cognitive testing for her and her husband Janet Wilson (both are Dr Cathren Laine pt's). She said her daughter has been "pushing" to have testing. Pt said she is "forgetful in one area and he in the other" (she did not specify). Advised if she needed an earlier appt later to call back and they could be seen by NP. Appt has been scheduled for 09/13/15

## 2015-08-02 ENCOUNTER — Ambulatory Visit (INDEPENDENT_AMBULATORY_CARE_PROVIDER_SITE_OTHER): Payer: PPO | Admitting: Neurology

## 2015-08-02 VITALS — BP 155/76 | HR 86 | Ht 63.75 in | Wt 117.8 lb

## 2015-08-02 DIAGNOSIS — R413 Other amnesia: Secondary | ICD-10-CM | POA: Diagnosis not present

## 2015-08-02 DIAGNOSIS — F05 Delirium due to known physiological condition: Secondary | ICD-10-CM

## 2015-08-02 DIAGNOSIS — M316 Other giant cell arteritis: Secondary | ICD-10-CM

## 2015-08-02 DIAGNOSIS — R410 Disorientation, unspecified: Secondary | ICD-10-CM

## 2015-08-02 DIAGNOSIS — E538 Deficiency of other specified B group vitamins: Secondary | ICD-10-CM | POA: Diagnosis not present

## 2015-08-02 DIAGNOSIS — W19XXXA Unspecified fall, initial encounter: Secondary | ICD-10-CM

## 2015-08-02 MED ORDER — DONEPEZIL HCL 10 MG PO TABS: 10.0000 mg | ORAL_TABLET | Freq: Every day | ORAL | Status: DC

## 2015-08-02 NOTE — Patient Instructions (Signed)
Remember to drink plenty of fluid, eat healthy meals and do not skip any meals. Try to eat protein with a every meal and eat a healthy snack such as fruit or nuts in between meals. Try to keep a regular sleep-wake schedule and try to exercise daily, particularly in the form of walking, 20-30 minutes a day, if you can.   As far as your medications are concerned, I would like to suggest: Start Aricept  As far as diagnostic testing: Labs, MRI of the brain  I would like to see you back in 6 months, sooner if we need to. Please call us with any interim questions, concerns, problems, updates or refill requests.   Our phone number is [phone]. We also have an after hours call service for urgent matters and there is a physician on-call for urgent questions. For any emergencies you know to call 911 or go to the nearest emergency room

## 2015-08-02 NOTE — Progress Notes (Signed)
[facility]    Provider:  Dr Jaynee Eagles Referring Provider: Josetta Huddle, MD Primary Care Physician:  Henrine Screws, MD  CC:  Memory changes  HPI:  Janet Wilson is a 81 y.o. female here as a referral from Dr. Inda Merlin for memory problems. She has a past medical history of arthritis, glaucoma, temporal arteritis. She is here with daughter and husband if there is anything that she started to exhibit problems with her memory. She is having memory lapses, increased frustration especially when this is mentioned to her, some increased episodes of anger, crying episodes. There are no mood issues but possibly she feels defensive when they point out. She doesn't remember.  She is having episodes of memory loss, she forgot the details of her knee surgery. Also such as when she insists on things and then gets angry if she is proven wrong such as details on driving. Started within the last 3 months. She never had this behavior in the past. Denies any mood changes or depression. Father had Alzheimers. She has lost her sense of smell 15 years ago. She forgets appointments, gets the wrong date, forgets the dates o events, misses appointments. Husband had always done the bills. She tried to send an email and couldn't figure it out. Pain in the back of the head is better. Janet Wilson how old her father was when he died, maybe 32, they were closed but he had suffered from dementia for many years without point. Patient reports she has [place] issues, gets confused especially with driving, some episodes of confusion. She insisted she had a knee replacement when she didn't. Balance is good but she has had a few falls mostly mechanical, where she trips over something, or when she was gardening and pulled too hard to get a weed up. No other focal neurologic deficits or complaints.  Review of Systems: Patient complains of symptoms per HPI as well as the following symptoms: Blurred vision. No  shortness of breath or chest pain. Pertinent negatives per HPI. All others negative.   Social History   Social History  . Marital Status: Married    Spouse Name: Janet Wilson  . Number of Children: 2  . Years of Education: college   Occupational History  . retired    Social History Main Topics  . Smoking status: Never Smoker   . Smokeless tobacco: Never Used  . Alcohol Use: Yes     Comment: SELDOM  . Drug Use: No  . Sexual Activity: Yes   Other Topics Concern  . Not on file   Social History Narrative   Pt lives at home with family.   Caffeine Use: Very little.    Patient is right handed        Family History  Problem Relation Age of Onset  . Alzheimer's disease Mother   . Heart attack Father     Past Medical History  Diagnosis Date  . Overactive bladder   . GERD (gastroesophageal reflux disease)   . Goiter     CAUSING COUGH, HOARSINESS AND DRY THROAT  . Arthritis     OA BOTH KNEES AND HANDS  . Glaucoma     left eye  . Temporal arteritis ([place])   . Headache(784.0)     MIGRAINES   . Atrial tachycardia ([place])     HX OF    Past Surgical History  Procedure Laterality Date  . Cervical fusion  2012  . Left shoulder surgery    . Incontinence surgery    .  Right knee arthroscopy  11/2009    . Eye surgery      CATARACT EXTRACTION- BILATERAL  . Total knee arthroplasty  07/04/2011    Procedure: TOTAL KNEE ARTHROPLASTY;  Surgeon: Gearlean Alf, MD;  Location: [facility];  Service: Orthopedics;  Laterality: Right;  . Joint replacement      right knee  . Artery biopsy Right 07/21/2012    Procedure: BIOPSY TEMPORAL ARTERY;  Surgeon: Haywood Lasso, MD;  Location: [facility];  Service: General;  Laterality: Right;  . Robotic assisted bilateral salpingo oopherectomy Bilateral 12/08/2013    Procedure: ROBOTIC ASSISTED LAPAROSCOPIC BILATERAL SALPINGO OOPHORECTOMY WITH STAGING;  Surgeon: Everitt Amber, MD;  Location: [facility];  Service: Gynecology;  Laterality: Bilateral;    Current  Outpatient Prescriptions  Medication Sig Dispense Refill  . aspirin EC 81 MG tablet Take 81 mg by mouth at bedtime.     . Biotin (BIOTIN 5000) 5 MG CAPS Take 1 capsule by mouth every morning.     . brinzolamide (AZOPT) 1 % ophthalmic suspension Place 1 drop into the left eye 2 (two) times daily.    . Calcium Carbonate-Vitamin D (CALCIUM PLUS VITAMIN D PO) Take 1 tablet by mouth 2 (two) times daily.     . Coenzyme Q10 (CO Q 10) 100 MG CAPS Take 1 tablet by mouth at bedtime.     . diazepam (VALIUM) 5 MG tablet Take 2.5 mg by mouth at bedtime.     Marland Kitchen estradiol (ESTRACE) 0.1 MG/GM vaginal cream Place 2 g vaginally once a week.    Marland Kitchen EVENING PRIMROSE OIL PO Take 1,300 mg by mouth at bedtime.    . fluticasone (FLONASE) 50 MCG/ACT nasal spray Place 1 spray into both nostrils daily as needed for allergies or rhinitis.    . Ibuprofen (ADVIL) 200 MG CAPS Take 400 mg by mouth daily as needed (pain.).    Marland Kitchen magnesium oxide (MAG-OX) 400 MG tablet Take 400 mg by mouth daily with breakfast.    . Multiple Vitamin (MULTIVITAMIN WITH MINERALS) TABS tablet Take 2 tablets by mouth daily.     . nitrofurantoin, macrocrystal-monohydrate, (MACROBID) 100 MG capsule Take 100 mg by mouth 2 (two) times daily as needed (bladder infection).    . Omega-3 Fatty Acids (FISH OIL) 1200 MG CAPS Take 1 capsule by mouth 2 (two) times daily.     . Probiotic Product (PROBIOTIC PO) Take 1 capsule by mouth at bedtime. Ultra Flora Balance.    . zolmitriptan (ZOMIG) 5 MG tablet Take 5 mg by mouth daily as needed for migraine. May repeat after 2 hours if headache returns.    . donepezil (ARICEPT) 10 MG tablet Take 1 tablet (10 mg total) by mouth at bedtime. 30 tablet 12   No current facility-administered medications for this visit.    Allergies as of 08/02/2015 - Review Complete 08/02/2015  Allergen Reaction Noted  . Oxycodone Anaphylaxis 06/18/2012  . Codeine Nausea Only 07/11/2012    Vitals: BP 155/76 mmHg  Pulse 86  Ht 5' 3.75"  (1.619 m)  Wt 117 lb 12.8 oz (53.434 kg)  BMI 20.39 kg/m2 Last Weight:  Wt Readings from Last 1 Encounters:  08/02/15 117 lb 12.8 oz (53.434 kg)   Last Height:   Ht Readings from Last 1 Encounters:  08/02/15 5' 3.75" (1.619 m)   Physical exam: Exam: Gen: NAD, conversant, well nourised, well groomed  CV: RRR, no MRG. No Carotid Bruits. No peripheral edema, warm, nontender Eyes: Conjunctivae clear without exudates or hemorrhage  Neuro: Detailed Neurologic Exam  Speech:    Speech is normal; fluent and spontaneous with normal comprehension.  Cognition:    The patient is oriented to person, place, and time;     recent and remote memory intact;     language fluent;     normal attention, concentration,     fund of knowledge Cranial Nerves:    The pupils are equal, round, and reactive to light. The fundi are normal and spontaneous venous pulsations are present. Visual fields are full to finger confrontation. Extraocular movements are intact. Trigeminal sensation is intact and the muscles of mastication are normal. The face is symmetric. The palate elevates in the midline. Hearing intact. Voice is normal. Shoulder shrug is normal. The tongue has normal motion without fasciculations.   Coordination:    Normal finger to nose and heel to shin. Normal rapid alternating movements.   Gait:    Heel-toe and tandem gait are normal.   Motor Observation:    No asymmetry, no atrophy, and no involuntary movements noted. Tone:    Normal muscle tone.    Posture:    Posture is normal. normal erect    Strength:    Strength is V/V in the upper and lower limbs.      Sensation: intact to LT     Reflex Exam:  DTR's:    Deep tendon reflexes in the upper and lower extremities are normal bilaterally.   Toes:    The toes are downgoing bilaterally.   Clonus:    Clonus is absent.       Assessment/Plan:  This is an extremely lovely 81 year old patient who I know very  well. I have been treating her for temporal arteritis and occipital headache. I have also seen her husband in clinic. They're very lovely, very active patients who looks much younger than their stated age. Her daughter is here today as well. They're moving into [facility] soon. Father had Alzheimer's in his 13s. She started having memory problems, her husband and daughter appear to be more concerned than she does. She has become frustrated and a little defensive penetrated discuss it with her. She is having difficulty with directions, forgetting dates, missing appointments, and some overall confusion with events. She scored quite well on a Mini-Mental Status exam 30 out of 30 but it does sound as though there is very mild memory changes. At this point I recommend MRI of the brain, we'll check B12 and thyroid, and start Aricept.  Sarina Ill, MD  [facility] [address] [place], [place] [address]  Phone [phone] Fax [phone]  A total of 40 minutes was spent face-to-face with this patient. Over half this time was spent on counseling patient on the memory loss diagnosis and different diagnostic and therapeutic options available.

## 2015-08-03 ENCOUNTER — Encounter: Payer: Self-pay | Admitting: Neurology

## 2015-08-03 ENCOUNTER — Telehealth: Payer: Self-pay

## 2015-08-03 NOTE — Telephone Encounter (Signed)
-----   Message from Melvenia Beam, MD sent at 08/03/2015  9:09 AM EDT ----- Labs look excellent, thanks!

## 2015-08-03 NOTE — Telephone Encounter (Signed)
I spoke with patient and she is aware of results below.

## 2015-08-07 LAB — SEDIMENTATION RATE: Sed Rate: 2 mm/hr (ref 0–40)

## 2015-08-07 LAB — C-REACTIVE PROTEIN: CRP: 2.5 mg/L (ref 0.0–4.9)

## 2015-08-07 LAB — B12 AND FOLATE PANEL
Folate: >20 ng/mL (ref >3.0)
Vitamin B-12: 1284 pg/mL — ABNORMAL HIGH (ref 211–946)

## 2015-08-07 LAB — METHYLMALONIC ACID, SERUM: Methylmalonic Acid: 151 nmol/L (ref 0–378)

## 2015-08-17 ENCOUNTER — Telehealth: Payer: Self-pay | Admitting: Neurology

## 2015-08-17 NOTE — Telephone Encounter (Signed)
Pt called to schedule imaging

## 2015-08-22 NOTE — Telephone Encounter (Signed)
Patient has been scheduled at [facility].

## 2015-09-06 ENCOUNTER — Ambulatory Visit
Admission: RE | Admit: 2015-09-06 | Discharge: 2015-09-06 | Disposition: A | Discharge status: Home / Self Care | Payer: PPO | Source: Ambulatory Visit | Attending: Neurology | Admitting: Neurology

## 2015-09-06 DIAGNOSIS — R05 Cough: Secondary | ICD-10-CM | POA: Insufficient documentation

## 2015-09-06 DIAGNOSIS — R490 Dysphonia: Secondary | ICD-10-CM | POA: Insufficient documentation

## 2015-09-06 DIAGNOSIS — R413 Other amnesia: Secondary | ICD-10-CM | POA: Diagnosis not present

## 2015-09-06 DIAGNOSIS — J310 Chronic rhinitis: Secondary | ICD-10-CM | POA: Insufficient documentation

## 2015-09-06 DIAGNOSIS — R059 Cough, unspecified: Secondary | ICD-10-CM | POA: Insufficient documentation

## 2015-09-06 MED ORDER — GADOBENATE DIMEGLUMINE 529 MG/ML IV SOLN
10.0000 mL | Freq: Once | INTRAVENOUS | Status: AC | PRN
  Administered 2015-09-06: 10 mL via INTRAVENOUS

## 2015-09-13 ENCOUNTER — Ambulatory Visit: Payer: PPO | Admitting: Neurology

## 2015-09-21 ENCOUNTER — Encounter: Payer: Self-pay | Admitting: Neurology

## 2015-10-23 ENCOUNTER — Encounter: Payer: Self-pay | Admitting: Neurology

## 2015-10-24 ENCOUNTER — Other Ambulatory Visit: Payer: Self-pay | Admitting: *Deleted

## 2015-10-24 ENCOUNTER — Telehealth: Payer: Self-pay | Admitting: Neurology

## 2015-10-24 DIAGNOSIS — M316 Other giant cell arteritis: Secondary | ICD-10-CM

## 2015-10-24 NOTE — Telephone Encounter (Signed)
Patient called to follow up from MyChart message sent last night, please call [phone].

## 2015-10-24 NOTE — Telephone Encounter (Signed)
Called patient back. Advised Dr Jaynee Eagles received messages and she placed lab orders as requested. I advised I also sent a mychart message. She verbalized understanding and will stop in this week to have them completed.

## 2015-10-24 NOTE — Telephone Encounter (Signed)
Per Dr Jaynee Eagles verbal, she is ok to place labs orders for ESR and CRP. Pt can come by anytime this week to have them done. Place orders as requested.

## 2015-10-26 ENCOUNTER — Other Ambulatory Visit (INDEPENDENT_AMBULATORY_CARE_PROVIDER_SITE_OTHER): Payer: Self-pay

## 2015-10-26 DIAGNOSIS — Z0289 Encounter for other administrative examinations: Secondary | ICD-10-CM

## 2015-10-26 DIAGNOSIS — M316 Other giant cell arteritis: Secondary | ICD-10-CM

## 2015-10-27 LAB — SEDIMENTATION RATE: Sed Rate: 2 mm/hr (ref 0–40)

## 2015-10-27 LAB — C-REACTIVE PROTEIN: CRP: 4 mg/L (ref 0.0–4.9)

## 2015-10-28 ENCOUNTER — Telehealth: Payer: Self-pay | Admitting: *Deleted

## 2015-10-28 NOTE — Telephone Encounter (Signed)
LVM for patient about lab results per Dr Jaynee Eagles note. Gave [facility] phone number if she has further questions.

## 2015-10-28 NOTE — Telephone Encounter (Signed)
-----  Message from Melvenia Beam, MD sent at 10/27/2015  5:04 PM EDT ----- ESR and CRP look wonderful !!!

## 2015-11-01 ENCOUNTER — Telehealth: Payer: Self-pay | Admitting: Neurology

## 2015-11-01 NOTE — Telephone Encounter (Addendum)
Dr Jaynee Eagles- I was not sure. Please advise  Called and spoke to patient. She stated her husband was reading the [place] about MRI's. He was telling her that the article said to be careful on what type of MRI you have. She said that some inject metal dye into veins that can cause metallic deposits in the brain. She wants to know what kind of MRI she had. Advised this was not my area of expertise. I will ask Dr Jaynee Eagles and call her back tomorrow to advise by tomorrow at the latest.

## 2015-11-01 NOTE — Telephone Encounter (Signed)
I have no suspicion that she has metallic remnants in the brain, she can call the imaging facility for details thanks

## 2015-11-01 NOTE — Telephone Encounter (Signed)
Called and spoke to pt and relayed Dr Cathren Laine message below. Advised she can call [facility] where she had imaging done and ask to speak with radiologist if needed to find out specifics of what she received. She verbalized understanding.

## 2015-11-01 NOTE — Telephone Encounter (Signed)
Pt called requesting call from nurse to discuss MRI. Please call and advise [phone]

## 2016-02-23 ENCOUNTER — Encounter: Payer: Self-pay | Admitting: Neurology

## 2016-02-24 ENCOUNTER — Other Ambulatory Visit: Payer: Self-pay | Admitting: Neurology

## 2016-02-24 DIAGNOSIS — M316 Other giant cell arteritis: Secondary | ICD-10-CM

## 2016-02-27 ENCOUNTER — Other Ambulatory Visit (INDEPENDENT_AMBULATORY_CARE_PROVIDER_SITE_OTHER): Payer: Self-pay

## 2016-02-27 DIAGNOSIS — M316 Other giant cell arteritis: Secondary | ICD-10-CM

## 2016-02-27 DIAGNOSIS — Z0289 Encounter for other administrative examinations: Secondary | ICD-10-CM

## 2016-02-28 LAB — SEDIMENTATION RATE: Sed Rate: 4 mm/hr (ref 0–40)

## 2016-02-28 LAB — C-REACTIVE PROTEIN: CRP: 2.6 mg/L (ref 0.0–4.9)

## 2016-04-19 ENCOUNTER — Telehealth: Payer: Self-pay | Admitting: Neurology

## 2016-04-19 NOTE — Telephone Encounter (Signed)
Patient's daughter is calling regarding the patient. She would not give any details.

## 2016-04-23 NOTE — Telephone Encounter (Signed)
Returned call to pt's daughter and left VM to call back if needed to discuss further.

## 2016-04-24 ENCOUNTER — Telehealth: Payer: Self-pay | Admitting: Neurology

## 2016-04-24 NOTE — Telephone Encounter (Signed)
Called and left VM mssg for pt to return call to schedule follow-up appt.

## 2016-04-24 NOTE — Telephone Encounter (Signed)
Delsa Sale, will you call and request an appointment with this patient? She is having more memory problems. The family would like her seen but it has to come from [place] so patient does not get upset. Ask for her husband [name] initially and let them know I'd like to see Shaunee Knepp back because it has been a while. Thank you !!

## 2016-04-24 NOTE — Telephone Encounter (Signed)
[name] returned RN's call °

## 2016-04-24 NOTE — Telephone Encounter (Signed)
How about 4pm on the 20th next Tuesday? thanks

## 2016-04-24 NOTE — Telephone Encounter (Signed)
Appointment Request From: Judi Saa    With Provider: Melvenia Beam, MD [[facility]]    Preferred Date Range: Any date 04/24/2016 or later    Preferred Times: Any    Reason for visit: Office Visit    Comments:  Hi Janet Wilson. This [name], not [employer]. I would like you to ask her to come for a office visit. Our daughter [name], has tried to contact you. The family feels [name] is showing more signs of memory loss and feel she should be checked by you again. But we need you to make the request. Thanks, [name]

## 2016-04-24 NOTE — Telephone Encounter (Signed)
Spoke to pt's daughter, [name]. She voiced concern that her mother has become even more forgetful, cannot even spell 3-4 letter words. She threw away the checkbook this week. Is downloading viruses on Facebook. Is getting lost when driving and mixing up her meds w/ her husband's. When family tries to talk to her about it, she becomes very defensive and angry. Daughter would like appt on a Mon or Tues so that she can accompany pt.

## 2016-04-25 NOTE — Telephone Encounter (Signed)
Pt returned call, not able to make next Tues at 4. Appt scheduled for Mon 3/26 @ 10:30.

## 2016-04-25 NOTE — Telephone Encounter (Signed)
Left mssgs again on both pt's home and cell #s. Requested call back to schedule follow-up. Offered next Tues 3/20 @ 4 p.m.

## 2016-05-07 ENCOUNTER — Ambulatory Visit (INDEPENDENT_AMBULATORY_CARE_PROVIDER_SITE_OTHER): Payer: Medicare Other | Admitting: Neurology

## 2016-05-07 ENCOUNTER — Encounter: Payer: Self-pay | Admitting: Neurology

## 2016-05-07 VITALS — BP 129/72 | HR 84 | Ht 63.75 in | Wt 125.0 lb

## 2016-05-07 DIAGNOSIS — G3184 Mild cognitive impairment, so stated: Secondary | ICD-10-CM

## 2016-05-07 DIAGNOSIS — M316 Other giant cell arteritis: Secondary | ICD-10-CM | POA: Diagnosis not present

## 2016-05-07 MED ORDER — DONEPEZIL HCL 5 MG PO TABS: 5.0000 mg | ORAL_TABLET | Freq: Every day | ORAL | 12 refills | Status: DC

## 2016-05-07 NOTE — Progress Notes (Signed)
[facility]    Provider:  Dr Jaynee Eagles Referring Provider: Josetta Huddle, MD Primary Care Physician:  Henrine Screws, MD  CC:  Memory changes  Interval history 05/07/2016: Janet Wilson is a 82 y.o. female here as a referral from Dr. Inda Merlin for memory problems. She has a past medical history of arthritis, glaucoma, temporal arteritis. She is here with daughter and husband. Started on Aricept at last appointment. She returns with worsening memory complaints. Father had Alzheimers in his 21s.  She has been taking Aricept 5 mg for 6 months, half of the 10 mg tab as the 10 mg tablet caused side effects. Patient's memory continues to decline. Had a discussion with husband and daughter, she is becoming more disoriented and in familiar places, she is losing things more, she is forgetting more appointments, dates and conversations.  Unfortunately patient denies any changes in his quite agitated with her family recently. Today I had to be very gentle when suggesting formal neurocognitive testing, I did tell her that this is something we should do especially in people with family history inordinate to make her upset. Family really wants her to have it done for baseline and to evaluate her current cognition which I think is a good idea. Also discussed with him our clinical trials Trailblazer's today which I think she would be wonderful for if she agrees to be part of it.  HPI:  Janet Wilson is a 82 y.o. female here as a referral from Dr. Inda Merlin for memory problems. She has a past medical history of arthritis, glaucoma, temporal arteritis. She is here with daughter and husband if there is anything that she started to exhibit problems with her memory. She is having memory lapses, increased frustration especially when this is mentioned to her, some increased episodes of anger, crying episodes. There are no mood issues but possibly she feels defensive when they point out. She doesn't  remember.  She is having episodes of memory loss, she forgot the details of her knee surgery. Also such as when she insists on things and then gets angry if she is proven wrong such as details on driving. Started within the last 3 months. She never had this behavior in the past. Denies any mood changes or depression. Father had Alzheimers. She has lost her sense of smell 15 years ago. She forgets appointments, gets the wrong date, forgets the dates o events, misses appointments. Husband had always done the bills. She tried to send an email and couldn't figure it out. Pain in the back of the head is better. [name] how old her father was when he died, maybe 50, they were closed but he had suffered from dementia for many years without point. Patient reports she has [place] issues, gets confused especially with driving, some episodes of confusion. She insisted she had a knee replacement when she didn't. Balance is good but she has had a few falls mostly mechanical, where she trips over something, or when she was gardening and pulled too hard to get a weed up. No other focal neurologic deficits or complaints.  Review of Systems: Patient complains of symptoms per HPI as well as the following symptoms: Blurred vision. No shortness of breath or chest pain. Pertinent negatives per HPI. All others negative.   Social History   Social History  . Marital status: Married    Spouse name: [name]  . Number of children: 2  . Years of education: college   Occupational History  . retired  Retired   Social History Main Topics  . Smoking status: Never Smoker  . Smokeless tobacco: Never Used  . Alcohol use Yes     Comment: SELDOM  . Drug use: No  . Sexual activity: Yes   Other Topics Concern  . Not on file   Social History Narrative   Pt lives at home with family.   Caffeine Use: Very little.    Patient is right handed        Family History  Problem Relation Age of Onset  . Alzheimer's disease  Mother   . Heart attack Father     Past Medical History:  Diagnosis Date  . Arthritis    OA BOTH KNEES AND HANDS  . Atrial tachycardia (HCC)    HX OF  . GERD (gastroesophageal reflux disease)   . Glaucoma    left eye  . Goiter    CAUSING COUGH, HOARSINESS AND DRY THROAT  . Headache(784.0)    MIGRAINES   . Overactive bladder   . Temporal arteritis [facility])     Past Surgical History:  Procedure Laterality Date  . ARTERY BIOPSY Right 07/21/2012   Procedure: BIOPSY TEMPORAL ARTERY;  Surgeon: Haywood Lasso, MD;  Location: [facility];  Service: General;  Laterality: Right;  . CERVICAL FUSION  2012  . EYE SURGERY     CATARACT EXTRACTION- BILATERAL  . INCONTINENCE SURGERY    . JOINT REPLACEMENT     right knee  . LEFT SHOULDER SURGERY    . RIGHT KNEE ARTHROSCOPY  11/2009    . ROBOTIC ASSISTED BILATERAL SALPINGO OOPHERECTOMY Bilateral 12/08/2013   Procedure: ROBOTIC ASSISTED LAPAROSCOPIC BILATERAL SALPINGO OOPHORECTOMY WITH STAGING;  Surgeon: Everitt Amber, MD;  Location: [facility];  Service: Gynecology;  Laterality: Bilateral;  . TOTAL KNEE ARTHROPLASTY  07/04/2011   Procedure: TOTAL KNEE ARTHROPLASTY;  Surgeon: Gearlean Alf, MD;  Location: [facility];  Service: Orthopedics;  Laterality: Right;    Current Outpatient Prescriptions  Medication Sig Dispense Refill  . aspirin EC 81 MG tablet Take 81 mg by mouth at bedtime.     . Biotin (BIOTIN 5000) 5 MG CAPS Take 1 capsule by mouth every morning.     . brinzolamide (AZOPT) 1 % ophthalmic suspension Place 1 drop into the left eye 2 (two) times daily.    . Calcium Carbonate-Vitamin D (CALCIUM PLUS VITAMIN D PO) Take 1 tablet by mouth 2 (two) times daily.     . Coenzyme Q10 (CO Q 10) 100 MG CAPS Take 1 tablet by mouth at bedtime.     . diazepam (VALIUM) 5 MG tablet Take 2.5 mg by mouth at bedtime.     Marland Kitchen estradiol (ESTRACE) 0.1 MG/GM vaginal cream Place 2 g vaginally once a week.    Marland Kitchen EVENING PRIMROSE OIL PO Take 1,300 mg by mouth at bedtime.      . fluticasone (FLONASE) 50 MCG/ACT nasal spray Place 1 spray into both nostrils daily as needed for allergies or rhinitis.    . Ibuprofen (ADVIL) 200 MG CAPS Take 400 mg by mouth daily as needed (pain.).    Marland Kitchen MAGNESIUM MALATE PO Take 300 mg by mouth at bedtime.    . Multiple Vitamin (MULTIVITAMIN WITH MINERALS) TABS tablet Take 2 tablets by mouth daily.     . nitrofurantoin, macrocrystal-monohydrate, (MACROBID) 100 MG capsule Take 100 mg by mouth 2 (two) times daily as needed (bladder infection).    . Omega-3 Fatty Acids (FISH OIL) 1200 MG CAPS Take 1 capsule  by mouth 2 (two) times daily.     . Probiotic Product (PROBIOTIC PO) Take 1 capsule by mouth at bedtime. Ultra Flora Balance.    . donepezil (ARICEPT) 5 MG tablet Take 1 tablet (5 mg total) by mouth at bedtime. 30 tablet 12   No current facility-administered medications for this visit.     Allergies as of 05/07/2016 - Review Complete 05/07/2016  Allergen Reaction Noted  . Oxycodone Anaphylaxis 06/18/2012  . Mold extract [trichophyton]  05/07/2016  . Codeine Nausea Only 07/11/2012    Vitals: BP 129/72   Pulse 84   Ht 5' 3.75" (1.619 m)   Wt 125 lb (56.7 kg)   BMI 21.62 kg/m  Last Weight:  Wt Readings from Last 1 Encounters:  05/07/16 125 lb (56.7 kg)   Last Height:   Ht Readings from Last 1 Encounters:  05/07/16 5' 3.75" (1.619 m)    Physical exam: Exam: Gen: NAD, conversant, well nourised, well groomed                     CV: RRR, no MRG. No Carotid Bruits. No peripheral edema, warm, nontender Eyes: Conjunctivae clear without exudates or hemorrhage  Neuro: Detailed Neurologic Exam  Speech:    Speech is normal; fluent and spontaneous with normal comprehension.  Cognition:  Montreal Cognitive Assessment  05/07/2016  Visuospatial/ Executive (0/5) 5  Naming (0/3) 3  Attention: Read list of digits (0/2) 2  Attention: Read list of letters (0/1) 1  Attention: Serial 7 subtraction starting at 100 (0/3) 3   Language: Repeat phrase (0/2) 2  Language : Fluency (0/1) 1  Abstraction (0/2) 1  Delayed Recall (0/5) 2  Orientation (0/6) 5  Total 25  Adjusted Score (based on education) 25       MMSE - Mini Mental State Exam 08/02/2015  Orientation to time 5  Orientation to Place 5  Registration 3  Attention/ Calculation 5  Recall 3  Language- name 2 objects 2  Language- repeat 1  Language- follow 3 step command 3  Language- read & follow direction 1  Write a sentence 1  Copy design 1  Total score 30   Cranial Nerves:    The pupils are equal, round, and reactive to light. The fundi are normal and spontaneous venous pulsations are present. Visual fields are full to finger confrontation. Extraocular movements are intact. Trigeminal sensation is intact and the muscles of mastication are normal. The face is symmetric. The palate elevates in the midline. Hearing intact. Voice is normal. Shoulder shrug is normal. The tongue has normal motion without fasciculations.   Coordination:    Normal finger to nose and heel to shin. Normal rapid alternating movements.   Gait:    Heel-toe and tandem gait are normal.   Motor Observation:    No asymmetry, no atrophy, and no involuntary movements noted. Tone:    Normal muscle tone.    Posture:    Posture is normal. normal erect    Strength:    Strength is V/V in the upper and lower limbs.           Assessment/Plan:  This is an extremely lovely 82 year old patient who I know very well. I have been treating her for temporal arteritis and occipital headache. I have also seen her husband in clinic. They're very lovely, very active patients who looks much younger than their stated age. Her daughter and husband are here today as well. The family is concerned because she  continues to decline cognitively and she is irritable with her family regarding her memory loss. Father had Alzheimer's in his 55s. She scored well on the Mini-Mental status exam and  last appointment 30 out of 30 but on the Montral cognitive assessment she scored 25 out of 30 today. It does sound as though she has mild cognitive impairment likely Alzheimer's  - Will order neurocognitive testing, family is very concerned and want a baseline. I had to be very gentle when asking her to do this as not to irritate her or suggest she has memory changes, she agreed very nicely. - Discussed Trailblazer's study - Discussed FDG PET brain scan, will order neurocognitive testing first - After neurocognitive testing, if that confirms suspicion, need to have a discussion with patient and hope she will accept the diagnosis so we can further try to help this lovely woman.  Sarina Ill, MD  [facility] [address] [place], [place] [address]  Phone [phone] Fax [phone]  A total of 60 minutes was spent face-to-face with this patient. Over half this time was spent on counseling patient on the memory loss diagnosis and different diagnostic and therapeutic options available.

## 2016-05-09 ENCOUNTER — Encounter: Payer: Self-pay | Admitting: Psychology

## 2016-06-05 ENCOUNTER — Other Ambulatory Visit (INDEPENDENT_AMBULATORY_CARE_PROVIDER_SITE_OTHER): Payer: Self-pay

## 2016-06-05 DIAGNOSIS — Z0289 Encounter for other administrative examinations: Secondary | ICD-10-CM

## 2016-06-06 LAB — SEDIMENTATION RATE: Sed Rate: 2 mm/hr (ref 0–40)

## 2016-06-06 LAB — C-REACTIVE PROTEIN: CRP: 3.1 mg/L (ref 0.0–4.9)

## 2016-06-07 ENCOUNTER — Telehealth: Payer: Self-pay

## 2016-06-07 NOTE — Telephone Encounter (Signed)
Called pt w/ lab results. May call back w/ additional questions/concerns.

## 2016-06-07 NOTE — Telephone Encounter (Signed)
-----   Message from Melvenia Beam, MD sent at 06/06/2016  8:09 AM EDT ----- Labs look fabulous, thanks!

## 2016-06-21 ENCOUNTER — Encounter: Payer: PPO | Admitting: Psychology

## 2016-07-03 ENCOUNTER — Encounter: Payer: PPO | Admitting: Psychology

## 2016-08-07 ENCOUNTER — Encounter: Payer: Self-pay | Admitting: Neurology

## 2016-08-13 ENCOUNTER — Ambulatory Visit (INDEPENDENT_AMBULATORY_CARE_PROVIDER_SITE_OTHER): Payer: Medicare Other | Admitting: Psychology

## 2016-08-13 ENCOUNTER — Encounter: Payer: Self-pay | Admitting: Psychology

## 2016-08-13 DIAGNOSIS — R413 Other amnesia: Secondary | ICD-10-CM

## 2016-08-13 NOTE — Progress Notes (Signed)
NEUROPSYCHOLOGICAL INTERVIEW (CPT: [id number])  Name: Janet Wilson Date of Birth: [birth year] Date of Interview: 08/13/2016  Reason for Referral:  Janet Wilson is a 82 y.o. female who is referred for neuropsychological evaluation by Dr. Sarina Ill of [facility] due to concerns about memory decline. This patient is unaccompanied for the clinical interview today although her daughter is waiting for her in the lobby.  History of Presenting Problem:  Janet Wilson has been treated for temporal arteritis and occipital headache by Dr. Jaynee Eagles. According to records reviewed, the patient's family reported concerns about memory decline in 07/2015 which they felt had been going on for about 3 months, but the patient herself denied memory concerns and was somewhat agitated about her family saying this. Her family reported difficulty with directions, forgetting dates, missing appointments, and some overall confusion with events (eg misremembering details of her recent surgery). She scored 30/30 on the MMSE. Aricept was started. It was noted that she has not had sense of smell for 15 years. She most recently saw Dr. Jaynee Eagles on 05/07/2016. MoCA was 25/30.   At today's appointment, the patient denies any memory concerns. She feels her short term and long term memory are both good. Upon direct questioning, she admits to some attention/concentration issues but feels these have been present her whole life and do not represent a change. She thinks she may have had ADHD as a child. She has always jumped from task to task before finishing each task. She reported that she sometimes loses concentration in conversation and does not hear what people are saying. Recently her husband told her she brought something up in conversation after everyone had moved on from the topic, and this was concerning to him. The patient also admits to misplacing items and then getting very worked up and upset about this,  but once she calms down she can find the item. She admits that she has mixed up appointments in the past, putting them on the wrong day in her calendar. She denies any trouble with remembering to take medications. She does report occasional word finding difficulty and she feels her spelling has declined recently. She denies any difficulty with driving directions.  Her husband has always managed the finances. She has been doing less cooking since they moved into [facility] in November 2017.  MRI of the brain completed on 09/06/2015 revealed mild age related cortical atrophy (stable since 06/18/2012), mild age-related chronic microvascular ischemic change (also stable), and no acute findings.  There is a family history of Alzheimer's disease in her father; symptom onset was likely in his 52s.  Physically, the patient denies any complaints. She states she had a knee surgery recently without any complications. She denies any trouble with walking or balance. She denies history of falls but Dr. Cathren Laine notes indicate a history of mechanical falls. She endorses sleep difficulty which she feels is secondary to donepezil. She reported that she wakes up around 1 am and cannot get back to sleep. She denies any change in appetite. She denies ever experiencing hallucinations or visual illusions. She denies any history of head injury.  Psychiatric history was denied aside from what she feels was a depressive episode several years ago in the context of moving to a different house. She had constant nausea and did not want to eat. She never saw a mental health professional.   She reports good mood currently. She denies psychosocial stress at the present time.   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  Marital history: Married x57 years, 2 children (son and daughter), 4 grands Alcohol:  very little Tobacco: never a smoker, but was exposed to second hand smoke growing up   Medical History: Past Medical History:  Diagnosis Date  . Arthritis    OA BOTH KNEES AND HANDS  . Atrial tachycardia (HCC)    HX OF  . GERD (gastroesophageal reflux disease)   . Glaucoma    left eye  . Goiter    CAUSING COUGH, HOARSINESS AND DRY THROAT  . Headache(784.0)    MIGRAINES   . Overactive bladder   . Temporal arteritis (HCC)      Current Medications:  Outpatient Encounter Prescriptions as of 08/13/2016  Medication Sig  . aspirin EC 81 MG tablet Take 81 mg by mouth at bedtime.   . Biotin (BIOTIN 5000) 5 MG CAPS Take 1 capsule by mouth every morning.   . brinzolamide (AZOPT) 1 % ophthalmic suspension Place 1 drop into the left eye 2 (two) times daily.  . Calcium Carbonate-Vitamin D (CALCIUM PLUS VITAMIN D PO) Take 1 tablet by mouth 2 (two) times daily.   . Coenzyme Q10 (CO Q 10) 100 MG CAPS Take 1 tablet by mouth at bedtime.   . diazepam (VALIUM) 5 MG tablet Take 2.5 mg by mouth at bedtime.   . donepezil (ARICEPT) 5 MG tablet Take 1 tablet (5 mg total) by mouth at bedtime.  Marland Kitchen estradiol (ESTRACE) 0.1 MG/GM vaginal cream Place 2 g vaginally once a week.  Marland Kitchen EVENING PRIMROSE OIL PO Take 1,300 mg by mouth at bedtime.  . fluticasone (FLONASE) 50 MCG/ACT nasal spray Place 1 spray into both nostrils daily as needed for allergies or rhinitis.  . Ibuprofen (ADVIL) 200 MG CAPS Take 400 mg by mouth daily as needed (pain.).  Marland Kitchen MAGNESIUM MALATE PO Take 300 mg by mouth at bedtime.  . Multiple Vitamin (MULTIVITAMIN WITH MINERALS) TABS tablet Take 2 tablets by mouth daily.   . nitrofurantoin, macrocrystal-monohydrate, (MACROBID) 100 MG capsule Take 100 mg by mouth 2 (two) times daily as needed (bladder infection).  . Omega-3 Fatty Acids (FISH OIL) 1200 MG CAPS Take 1 capsule by mouth 2 (two) times daily.   . Probiotic Product (PROBIOTIC PO) Take 1 capsule by mouth at bedtime. Ultra Flora Balance.    No facility-administered encounter medications on file as of 08/13/2016.      Behavioral Observations:   Appearance: Neatly and appropriately dressed and groomed, appearing much younger than stated age. Gait: Ambulated independently, no gross abnormalities observed Speech: Fluent; normal rate, rhythm and volume. No significant word finding difficulty observed during conversational speech. Thought process: Linear, goal directed Affect: Full, euthymic Interpersonal: Pleasant, appropriate   TESTING: There is medical necessity to proceed with neuropsychological assessment as the results will be used to aid in differential diagnosis and clinical decision-making and to inform specific treatment recommendations. Per the patient and medical records reviewed, there has been a change in cognitive functioning and a reasonable suspicion of MCI or dementia.  Following the clinical interview, the patient completed a full battery of neuropsychological testing with my psychometrician under my supervision.   PLAN: The patient will return to see me for a follow-up session at which time her test performances and my impressions and treatment recommendations will be reviewed in detail.  Full report to follow.

## 2016-08-13 NOTE — Progress Notes (Signed)
   Neuropsychology Note  Janet Wilson came in today for 1 hour of neuropsychological testing with technician, Milana Kidney, BS, under the supervision of Dr. Macarthur Critchley. The patient did not appear overtly distressed by the testing session, per behavioral observation or via self-report to the technician. Rest breaks were offered. Janet Wilson will return within 2 weeks for a feedback session with Dr. Si Raider at which time her test performances, clinical impressions and treatment recommendations will be reviewed in detail. The patient understands she can contact our office should she require our assistance before this time.  Full report to follow.

## 2016-08-27 ENCOUNTER — Encounter: Payer: Self-pay | Admitting: Neurology

## 2016-08-27 ENCOUNTER — Other Ambulatory Visit: Payer: Self-pay | Admitting: Neurology

## 2016-08-27 DIAGNOSIS — M316 Other giant cell arteritis: Secondary | ICD-10-CM

## 2016-08-28 ENCOUNTER — Other Ambulatory Visit (INDEPENDENT_AMBULATORY_CARE_PROVIDER_SITE_OTHER): Payer: Self-pay

## 2016-08-28 DIAGNOSIS — Z0289 Encounter for other administrative examinations: Secondary | ICD-10-CM

## 2016-08-28 DIAGNOSIS — M316 Other giant cell arteritis: Secondary | ICD-10-CM

## 2016-08-29 LAB — C-REACTIVE PROTEIN: CRP: 1.5 mg/L (ref 0.0–4.9)

## 2016-08-29 LAB — SEDIMENTATION RATE: Sed Rate: 2 mm/hr (ref 0–40)

## 2016-08-30 ENCOUNTER — Encounter: Payer: Self-pay | Admitting: Neurology

## 2016-08-30 ENCOUNTER — Telehealth: Payer: Self-pay | Admitting: *Deleted

## 2016-08-30 NOTE — Telephone Encounter (Signed)
Spoke with patient and informed her that her labs are very good and, they keep getting better, improving. She verbalized understanding, appreciation.

## 2016-09-15 NOTE — Progress Notes (Signed)
NEUROPSYCHOLOGICAL EVALUATION   Name:    Janet Wilson  Date of Birth:   [birth year] Date of Interview:  08/13/2016 Date of Testing:  08/13/2016   Date of Feedback:  09/17/2016       Background Information:  Reason for Referral:  Janet Wilson is a 82 y.o. female referred by Dr. Sarina Ill to assess her current level of cognitive functioning and assist in differential diagnosis. The current evaluation consisted of a review of available medical records, an interview with the patient, and the completion of a neuropsychological testing battery. Informed consent was obtained.  History of Presenting Problem:  Janet Wilson has been treated for temporal arteritis and occipital headache by Dr. Jaynee Eagles. According to records reviewed, the patient's family reported concerns about memory decline in 07/2015 which they felt had been going on for about 3 months, but the patient herself denied memory concerns and was somewhat agitated about her family saying this. Her family reported difficulty with directions, forgetting dates, missing appointments, and some overall confusion with events (eg misremembering details of her recent surgery). She scored 30/30 on the MMSE. Aricept was started. It was noted that she has not had sense of smell for 15 years. She most recently saw Dr. Jaynee Eagles on 05/07/2016. MoCA was 25/30.   At today's appointment, the patient denies any memory concerns. She feels her short term and long term memory are both good. Upon direct questioning, she admits to some attention/concentration issues but feels these have been present her whole life and do not represent a change. She thinks she may have had ADHD as a child. She has always jumped from task to task before finishing each task. She reported that she sometimes loses concentration in conversation and does not hear what people are saying. Recently her husband told her she brought something up in conversation after everyone had moved  on from the topic, and this was concerning to him. The patient also admits to misplacing items and then getting very worked up and upset about this, but once she calms down she can find the item. She admits that she has mixed up appointments in the past, putting them on the wrong day in her calendar. She denies any trouble with remembering to take medications. She does report occasional word finding difficulty and she feels her spelling has declined recently. She denies any difficulty with driving directions.  Her husband has always managed the finances. She has been doing less cooking since they moved into [facility] in November 2017.  MRI of the brain completed on 09/06/2015 revealed mild age related cortical atrophy (stable since 06/18/2012), mild age-related chronic microvascular ischemic change (also stable), and no acute findings.  There is a family history of Alzheimer's disease in her father; symptom onset was likely in his 29s.  Physically, the patient denies any complaints. She states she had a knee surgery recently without any complications. She denies any trouble with walking or balance. She denies history of falls but Dr. Cathren Laine notes indicate a history of mechanical falls. She endorses sleep difficulty which she feels is secondary to donepezil. She reported that she wakes up around 1 am and cannot get back to sleep. She denies any change in appetite. She denies ever experiencing hallucinations or visual illusions. She denies any history of head injury.  Psychiatric history was denied aside from what she feels was a depressive episode several years ago in the context of moving to a different house. She had constant nausea  and did not want to eat. She never saw a mental health professional.   She reports good mood currently. She denies psychosocial stress at the present time.   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  Marital history: Married x57 years, 2 children (son and daughter), 4 grands Alcohol: very little Tobacco: never a smoker, but was exposed to second hand smoke growing up   Medical History:  Past Medical History:  Diagnosis Date  . Arthritis    OA BOTH KNEES AND HANDS  . Atrial tachycardia (HCC)    HX OF  . GERD (gastroesophageal reflux disease)   . Glaucoma    left eye  . Goiter    CAUSING COUGH, HOARSINESS AND DRY THROAT  . Headache(784.0)    MIGRAINES   . Overactive bladder   . Temporal arteritis ([place])     Current medications:  Outpatient Encounter Prescriptions as of 09/17/2016  Medication Sig  . aspirin EC 81 MG tablet Take 81 mg by mouth at bedtime.   . Biotin (BIOTIN 5000) 5 MG CAPS Take 1 capsule by mouth every morning.   . brinzolamide (AZOPT) 1 % ophthalmic suspension Place 1 drop into the left eye 2 (two) times daily.  . Calcium Carbonate-Vitamin D (CALCIUM PLUS VITAMIN D PO) Take 1 tablet by mouth 2 (two) times daily.   . Coenzyme Q10 (CO Q 10) 100 MG CAPS Take 1 tablet by mouth at bedtime.   . diazepam (VALIUM) 5 MG tablet Take 2.5 mg by mouth at bedtime.   . donepezil (ARICEPT) 5 MG tablet Take 1 tablet (5 mg total) by mouth at bedtime.  Marland Kitchen estradiol (ESTRACE) 0.1 MG/GM vaginal cream Place 2 g vaginally once a week.  Marland Kitchen EVENING PRIMROSE OIL PO Take 1,300 mg by mouth at bedtime.  . fluticasone (FLONASE) 50 MCG/ACT nasal spray Place 1 spray into both nostrils daily as needed for allergies or rhinitis.  . Ibuprofen (ADVIL) 200 MG CAPS Take 400 mg by mouth daily as needed (pain.).  Marland Kitchen MAGNESIUM MALATE PO Take 300 mg by mouth at bedtime.  . Multiple Vitamin (MULTIVITAMIN WITH MINERALS) TABS tablet Take 2 tablets by mouth daily.   . nitrofurantoin, macrocrystal-monohydrate, (MACROBID) 100 MG capsule Take 100 mg by mouth 2 (two) times daily as needed (bladder infection).  . Omega-3 Fatty Acids (FISH  OIL) 1200 MG CAPS Take 1 capsule by mouth 2 (two) times daily.   . Probiotic Product (PROBIOTIC PO) Take 1 capsule by mouth at bedtime. Ultra Flora Balance.   No facility-administered encounter medications on file as of 09/17/2016.      Current Examination:  Behavioral Observations:   Appearance: Neatly and appropriately dressed and groomed, appearing much younger than stated age. Gait: Ambulated independently, no gross abnormalities observed Speech: Fluent; normal rate, rhythm and volume. No significant word finding difficulty observed during conversational speech. Thought process: Linear, goal directed Affect: Full, euthymic Interpersonal: Pleasant, appropriate Orientation: Oriented to all spheres. Accurately named the current President and his predecessor.   Tests Administered: . Test of Premorbid Functioning (TOPF) . Wechsler Adult Intelligence Scale-Fourth Edition (WAIS-IV): Similarities, Music therapist, Coding and Digit Span subtests . Wechsler Memory Scale-Fourth Edition (WMS-IV) Older Adult Version (ages 38-90): Logical Memory I, II and Recognition subtests  . [employer] Verbal Learning Test - 2nd Edition (CVLT-2) Short Form . Repeatable Battery for the Assessment of Neuropsychological Status (RBANS) Form A:  Figure Copy and Recall subtests and Semantic Fluency subtest . Neuropsychological Assessment Battery (NAB) Language Module, Form 1: Naming subtest . Boston Diagnostic Aphasia Examination: Complex Ideational Material subtest . Controlled Oral Word Association Test (COWAT) . Trail Making Test A and B . Clock drawing test . Geriatric Depression Scale (GDS) 15 Item . Generalized Anxiety Disorder - 7 item screener (GAD-7)  Test Results: Note: Standardized scores are presented only for use by appropriately trained professionals and to allow for any future test-retest comparison. These scores should not be interpreted without consideration of all the information that is contained  in the rest of the report. The most recent standardization samples from the test publisher or other sources were used whenever possible to derive standard scores; scores were corrected for age, gender, ethnicity and education when available.   Test Scores:  Test Name Raw Score Standardized Score Descriptor  TOPF 55/70 SS= 113 High average  WAIS-IV Subtests     Similarities 23/36 ss= 11 Average  Block Design 29/66 ss= 11 Average  Coding 47/135 ss= 12 High average  Digit Span Forward 6/16 ss= 6 Low average  Digit Span Backward 5/16 ss= 7 Low average  WMS-IV Subtests     LM I 21/53 ss= 8 Average  LM II 12/39 ss= 9 Average  LM II Recognition 16/23 Cum %: 26-50 WNL  RBANS Subtests     Figure Copy 18/20 Z= 0.4 Average  Figure Recall 14/20 Z= 0.6 Average  Semantic Fluency 16 Z= -0.4 Average  CVLT-II Scores     Trial 1 4/9 Z= -1 Low average  Trial 4 5/9 Z= -1.5 Borderline  Trials 1-4 total 20/36 T= 40 Low average  SD Free Recall 5/9 Z= -1 Low average  LD Free Recall 5/9 Z= -0.5 Average  LD Cued Recall 6/9 Z= -0.5 Average  Recognition Discriminability 8/9 hits, 0 false positives Z= 0.5 Average  Forced Choice Recognition 9/9  WNL  NAB Language subtest     Naming 30/31 T= 63 High average  BDAE Complex Ideational Material 9/12  Impaired  COWAT-FAS 36 T= 49 Average  COWAT-Animals 13 T= 42 Low average  Trail Making Test A  51" 2 errors T= 54 Average  Trail Making Test B  Pt unable   Impaired  Clock Drawing   WNL  GDS-15 0/15  WNL  GAD-7 1/21  WNL      Description of Test Results:  Premorbid verbal intellectual abilities were estimated to have been within the high average range based on a test of word reading. Psychomotor processing speed was high average. Auditory attention and working memory were low average. Visual-spatial construction was average. Language abilities were variable. Specifically, confrontation naming was high average, and semantic verbal fluency was average to low  average. Auditory comprehension of complex ideational material was impaired. With regard to verbal memory, encoding and acquisition of non-contextual information (i.e., word list) was low average with a relatively flat learning curve across four learning trials. After a brief distracter task, free recall was low average (5/9 items recalled). After a delay, free recall was average with 100% retention of previously encoded information (5/9 items recalled). Cued recall was average (6/9 items recalled). Performance on a yes/no recognition task was average. On another verbal memory test, encoding and acquisition of contextual auditory information (i.e., short stories) was average. After a delay, free recall was average. Performance on a yes/no recognition task was within normal limits. With regard to non-verbal memory, delayed free recall of visual information was  average. Executive functioning was variable. Mental flexibility and set-shifting were impaired; she was unable to complete Trails B. Verbal fluency with phonemic search restrictions was average. Verbal abstract reasoning was average. Performance on a clock drawing task was within normal limits. On self-report measures of mood, the patient's responses were not indicative of clinically significant depression or generalized anxiety at the present time.     Clinical Impressions: Mild cognitive impairment. Results of formal cognitive evaluation were largely within normal limits, with most areas of function in the average to high average range. Processing speed, visual-spatial construction, semantic retrieval, abstract reasoning and learning/memory abilities were all within the normal range. However, auditory attention/working memory were below expectation, and there was evidence of more prominent deficits in mental flexibility/set-shifting and auditory comprehension of complex material. Her overall pattern of strengths and weaknesses on testing is more  suggestive of subcortical than cortical involvement. Her testing results are not consistent with a diagnosis of dementia but do warrant a diagnosis of mild cognitive impairment, which appears non-amnestic. There is no evidence of a primary psychiatric component. I do advise that she undergo neuropsychological re-evaluation in 1 year to monitor for any decline. Since she is above-average in terms of premorbid intellectual abilities, she may perform better on testing than in daily life.   Recommendations: Based on the findings of the present evaluation, the following recommendations are offered:  --We reviewed strategies to maintain brain health including cardiovascular exercise, mental stimulation and social interaction. --Neuropsychological re-assessment in one year is recommended in order to monitor cognitive status, track any progression of symptoms and further assist with treatment planning.    Feedback to Patient: Mayli Covington Lochner and her daughter and husband returned for a feedback appointment on 09/17/2016 to review the results of her neuropsychological evaluation with this provider. 30 minutes face-to-face time was spent reviewing her test results, my impressions and my recommendations as detailed above. I provided time for them to ask questions of me about the results and my impressions, and I answered all their questions to the best of my ability.    Total time spent on this patient's case: [id number] unit for interview with psychologist; [phone] units of testing by psychometrician under psychologist's supervision; [phone] units for medical record review, scoring of neuropsychological tests, interpretation of test results, preparation of this report, and review of results to the patient by psychologist.      Thank you for your referral of Rashauna Tep Meyerhoff. Please feel free to contact me if you have any questions or concerns regarding this report.

## 2016-09-17 ENCOUNTER — Encounter: Payer: Self-pay | Admitting: Psychology

## 2016-09-17 ENCOUNTER — Ambulatory Visit (INDEPENDENT_AMBULATORY_CARE_PROVIDER_SITE_OTHER): Payer: Medicare Other | Admitting: Psychology

## 2016-09-17 DIAGNOSIS — R413 Other amnesia: Secondary | ICD-10-CM

## 2016-09-17 DIAGNOSIS — G3184 Mild cognitive impairment, so stated: Secondary | ICD-10-CM

## 2016-09-20 ENCOUNTER — Encounter: Payer: Self-pay | Admitting: Neurology

## 2016-09-24 MED ORDER — RIVASTIGMINE TARTRATE 1.5 MG PO CAPS: 1.5000 mg | ORAL_CAPSULE | Freq: Two times a day (BID) | ORAL | 11 refills | Status: DC

## 2016-09-24 NOTE — Addendum Note (Signed)
Addended by: Monte Fantasia on: 09/24/2016 08:20 AM   Modules accepted: Orders

## 2016-10-06 ENCOUNTER — Telehealth: Payer: Self-pay | Admitting: Neurology

## 2016-10-06 NOTE — Telephone Encounter (Signed)
I spoke to daughter regarding neurocognitive testing with [facility]. I suspect patient with progress to alzheimers. We should repeat in one year and also ibcrease Exelon.  Terrence Dupont, would you call patient and husband and schedule an appointment for follow up? I do need extra time at least an hour appointment block thanks.

## 2016-10-08 ENCOUNTER — Encounter: Payer: Self-pay | Admitting: Cardiology

## 2016-10-08 ENCOUNTER — Ambulatory Visit (INDEPENDENT_AMBULATORY_CARE_PROVIDER_SITE_OTHER): Payer: Medicare Other | Admitting: Cardiology

## 2016-10-08 VITALS — BP 130/64 | HR 84 | Ht 64.0 in | Wt 119.4 lb

## 2016-10-08 DIAGNOSIS — R413 Other amnesia: Secondary | ICD-10-CM | POA: Diagnosis not present

## 2016-10-08 DIAGNOSIS — R058 Other specified cough: Secondary | ICD-10-CM

## 2016-10-08 DIAGNOSIS — R05 Cough: Secondary | ICD-10-CM | POA: Diagnosis not present

## 2016-10-08 DIAGNOSIS — I471 Supraventricular tachycardia: Secondary | ICD-10-CM

## 2016-10-08 NOTE — Progress Notes (Signed)
Cardiology Office Note:    Date:  10/08/2016   ID:  Janet Wilson, DOB [birth year], MRN [id number]  PCP:  Josetta Huddle, MD  Cardiologist:  Candee Furbish, MD    Referring MD: Josetta Huddle, MD     History of Present Illness:    Janet Wilson is a 82 y.o. female patient of Dr. Mertha Finders here for the evaluation of tachycardia.  In review of medical records on 12/14/15 she was seen in the [facility] cardiology clinic. At that point she was without cardiac complaints reporting water aerobics, moving to retirement community in [place].  She wore an event monitor which described short runs of SVT probably ectopic atrial rhythm or paroxysmal atrial tachycardia. No evidence of atrial flutter or fibrillation.  She had an episode lasting about 30 minutes, saw her heart pounding through her clothing. No syncope, no shortness of breath, no chest pain. Has not had any further episodes. Overall doing well.  Has had a history of productive cough seen by Dr. Erik Obey.  Past Medical History:  Diagnosis Date  . Arthritis    OA BOTH KNEES AND HANDS  . Atrial tachycardia (HCC)    HX OF  . GERD (gastroesophageal reflux disease)   . Glaucoma    left eye  . Goiter    CAUSING COUGH, HOARSINESS AND DRY THROAT  . Headache(784.0)    MIGRAINES   . Overactive bladder   . Temporal arteritis [facility])     Past Surgical History:  Procedure Laterality Date  . ARTERY BIOPSY Right 07/21/2012   Procedure: BIOPSY TEMPORAL ARTERY;  Surgeon: Haywood Lasso, MD;  Location: [facility];  Service: General;  Laterality: Right;  . CERVICAL FUSION  2012  . EYE SURGERY     CATARACT EXTRACTION- BILATERAL  . INCONTINENCE SURGERY    . JOINT REPLACEMENT     right knee  . LEFT SHOULDER SURGERY    . RIGHT KNEE ARTHROSCOPY  11/2009    . ROBOTIC ASSISTED BILATERAL SALPINGO OOPHERECTOMY Bilateral 12/08/2013   Procedure: ROBOTIC ASSISTED LAPAROSCOPIC BILATERAL SALPINGO OOPHORECTOMY WITH STAGING;   Surgeon: Everitt Amber, MD;  Location: [facility];  Service: Gynecology;  Laterality: Bilateral;  . TOTAL KNEE ARTHROPLASTY  07/04/2011   Procedure: TOTAL KNEE ARTHROPLASTY;  Surgeon: Gearlean Alf, MD;  Location: [facility];  Service: Orthopedics;  Laterality: Right;    Current Medications:  Medication list reviewed in record system. Aricept noted. No beta blockers or calcium channel blockers.   Allergies:   Oxycodone; Mold extract [trichophyton]; and Codeine   Social History   Social History  . Marital status: Married    Spouse name: [name]  . Number of children: 2  . Years of education: college   Occupational History  . retired Retired   Social History Main Topics  . Smoking status: Never Smoker  . Smokeless tobacco: Never Used  . Alcohol use Yes     Comment: SELDOM, very little  . Drug use: No  . Sexual activity: Yes   Other Topics Concern  . None   Social History Narrative   Pt lives at home with family.   Caffeine Use: Very little.    Patient is right handed         Family History: The patient's family history includes Alzheimer's disease in her mother; Heart attack in her father. ROS:   Please see the history of present illness.     All other systems reviewed and are negative.  EKGs/Labs/Other Studies Reviewed:  The following studies were reviewed today: Hemoglobin A1c 5.4, ALT 13, TSH 1.2, LDL 113  EKG:  EKG is  ordered today.  The ekg ordered today demonstrates 10/08/16-sinus rhythm left axis deviation left bundle branch block 84 bpm.  Recent Labs: No results found for requested labs within last 8760 hours.  Recent Lipid Panel No results found for: CHOL, TRIG, HDL, CHOLHDL, VLDL, LDLCALC, LDLDIRECT  Physical Exam:    VS:  BP 130/64 (BP Location: Right Arm)   Pulse 84   Ht 5\' 4"  (1.626 m)   Wt 119 lb 6.4 oz (54.2 kg)   LMP  (LMP Unknown)   BMI 20.49 kg/m     Wt Readings from Last 3 Encounters:  10/08/16 119 lb 6.4 oz (54.2 kg)  05/07/16 125 lb  (56.7 kg)  08/02/15 117 lb 12.8 oz (53.4 kg)     GEN:  Well nourished, well developed in no acute distressLooks younger than stated age 35: Normal NECK: No JVD; No carotid bruits LYMPHATICS: No lymphadenopathy CARDIAC: RRR, no murmurs, rubs, gallops RESPIRATORY:  Clear to auscultation without rales, wheezing or rhonchi  ABDOMEN: Soft, non-tender, non-distended MUSCULOSKELETAL:  No edema; No deformity  SKIN: Warm and dry NEUROLOGIC:  Alert and oriented x 3 PSYCHIATRIC:  Normal affect   ASSESSMENT:    1. Ectopic atrial tachycardia (HCC)   2. Productive cough   3. Memory impairment    PLAN:    In order of problems listed above:  Ectopic atrial tachycardia/paroxysmal atrial tachycardia  - Picked up on event monitor in 2017. Benign. No atrial fibrillation.Currently not interested in beta blocker, I agree.  - Reassuring,Described that this was not atrial fibrillation and should not be a stroke risk factor.  Family history of CAD  - She had previous family members in their 30s died from heart disease. She is currently in her 25s and has surpassed this genetic component. Excellent. Continue with activity. She is at [facility].  Productive cough  - Dr. Erik Obey  Memory impairment  - On Aricept. Went through a day of neurologic testing. Has some trouble with short-term memory.  Please let us know we can be of further assistance. We'll see her back as needed. I take care of her husband.  Medication Adjustments/Labs and Tests Ordered: Current medicines are reviewed at length with the patient today.  Concerns regarding medicines are outlined above.  Orders Placed This Encounter  Procedures  . EKG 12-Lead   No orders of the defined types were placed in this encounter.   Signed, Candee Furbish, MD  10/08/2016 9:36 AM    [facility]

## 2016-10-08 NOTE — Telephone Encounter (Signed)
Called and LVM for pt offering this Thursday 10/11/16 at 3:00pm, check in 230pm.   *Please schedule for 1 hour if she calls and accepts appt, thank you

## 2016-10-08 NOTE — Telephone Encounter (Signed)
Scheduled patient for 8/30 at 3pm for one hour per AA,MD request.

## 2016-10-08 NOTE — Patient Instructions (Signed)
Medication Instructions:  The current medical regimen is effective;  continue present plan and medications.  Follow-Up: Follow up as needed.  Thank you for choosing [facility]!!     

## 2016-10-11 ENCOUNTER — Ambulatory Visit (INDEPENDENT_AMBULATORY_CARE_PROVIDER_SITE_OTHER): Payer: Medicare Other | Admitting: Neurology

## 2016-10-11 ENCOUNTER — Encounter: Payer: Self-pay | Admitting: Neurology

## 2016-10-11 VITALS — BP 138/70 | HR 83 | Ht 64.0 in | Wt 121.4 lb

## 2016-10-11 DIAGNOSIS — G3184 Mild cognitive impairment, so stated: Secondary | ICD-10-CM | POA: Diagnosis not present

## 2016-10-11 DIAGNOSIS — M316 Other giant cell arteritis: Secondary | ICD-10-CM

## 2016-10-16 NOTE — Progress Notes (Signed)
[facility]    Provider:  Dr Jaynee Eagles Referring Provider: Josetta Huddle, MD Primary Care Physician:  Josetta Huddle, MD  CC: Memory changes  Interval history 12/11/2016: Patient returns today with her daughter and husband to discuss neurocognitive testing which revealed mild cognitive impairment. I discussed that given her family history she is more at risk to progress to Alzheimer's disease. A portion of people with mild cognitive impairment do proceed to dementia. At this point I recommend trying to increase Exelon. Discussed clinical trials and patient is not interested. At a long discussion about mild cognitive impairment, things that we can do to further delineate such as FDG PET scans, other specialized scanning, research opportunities. Husband and daughter do feel as though she is more irritable and her short-term memory is worsening.  Interval history 05/07/2016: Janet Wilson a 82 Wilson as a referral from Dr. Val Eagle memory problems. She has a past medical history of arthritis, glaucoma, temporal arteritis.She is here with daughter and husband. Started on Aricept at last appointment. She returns with worsening memory complaints. Father had Alzheimers in his 18s.  She has been taking Aricept 5 mg for 6 months, half of the 10 mg tab as the 10 mg tablet caused side effects. Patient's memory continues to decline. Had a discussion with husband and daughter, she is becoming more disoriented and in familiar places, she is losing things more, she is forgetting more appointments, dates and conversations.  Unfortunately patient denies any changes in his quite agitated with her family recently. Today I had to be very gentle when suggesting formal neurocognitive testing, I did tell her that this is something we should do especially in people with family history inordinate to make her upset. Family really wants her to have it done for baseline and to evaluate her  current cognition which I think is a good idea. Also discussed with him our clinical trials [facility] today which I think she would be wonderful for if she agrees to be part of it.  [id number] A Wilson a 82 Wilson as a referral from Dr. Val Eagle memory problems. She has a past medical history of arthritis, glaucoma, temporal arteritis.She is here with daughter and husband if there is anything that she started to exhibit problems with her memory.She is having memory lapses, increased frustration especially when this is mentioned to her, some increased episodes of anger, crying episodes. There are no mood issues but possibly she feels defensive when they point out. She doesn't remember. She is having episodes of memory loss, she forgot the details of her knee surgery. Also such as when she insists on things and then gets angry if she is proven wrong such as details on driving. Started within the last 3 months. She never had this behavior in the past. Denies any mood changes or depression. Father had Alzheimers. She has lost her sense of smell 15 years ago. She forgets appointments, gets the wrong date, forgets the dates o events, misses appointments. Husband had always done the bills. She tried to send an email and couldn't figure it out. Pain in the back of the head is better. [name] how old her father was when he died, maybe 27, they were closed but he had suffered from dementia for many years without point. Patient reports she has Solis issues, gets confused especially with driving, some episodes of confusion. She insisted she had a knee replacement when she didn't. Balance is good but she has had a few falls mostly mechanical, where  she trips over something, or when she was gardening and pulled too hard to get a weed up. No other focal neurologic deficits or complaints.  Review of Systems: Patient complains of symptoms per HPI as well as the following symptoms: Blurred  vision. No shortness of breath or chest pain. Pertinent negatives per HPI. All others negative.  Social History   Social History  . Marital status: Married    Spouse name: [name]  . Number of children: 2  . Years of education: college   Occupational History  . retired Retired   Social History Main Topics  . Smoking status: Never Smoker  . Smokeless tobacco: Never Used  . Alcohol use Yes     Comment: SELDOM, very little  . Drug use: No  . Sexual activity: Yes   Other Topics Concern  . Not on file   Social History Narrative   Pt lives at home with family.   Caffeine Use: Very little.    Patient is right handed        Family History  Problem Relation Age of Onset  . Alzheimer's disease Mother   . Heart attack Father     Past Medical History:  Diagnosis Date  . Arthritis    OA BOTH KNEES AND HANDS  . Atrial tachycardia (HCC)    HX OF  . GERD (gastroesophageal reflux disease)   . Glaucoma    left eye  . Goiter    CAUSING COUGH, HOARSINESS AND DRY THROAT  . Headache(784.0)    MIGRAINES   . Overactive bladder   . Temporal arteritis [facility])     Past Surgical History:  Procedure Laterality Date  . ARTERY BIOPSY Right 07/21/2012   Procedure: BIOPSY TEMPORAL ARTERY;  Surgeon: Haywood Lasso, MD;  Location: [facility];  Service: General;  Laterality: Right;  . CERVICAL FUSION  2012  . EYE SURGERY     CATARACT EXTRACTION- BILATERAL  . INCONTINENCE SURGERY    . JOINT REPLACEMENT     right knee  . LEFT SHOULDER SURGERY    . RIGHT KNEE ARTHROSCOPY  11/2009    . ROBOTIC ASSISTED BILATERAL SALPINGO OOPHERECTOMY Bilateral 12/08/2013   Procedure: ROBOTIC ASSISTED LAPAROSCOPIC BILATERAL SALPINGO OOPHORECTOMY WITH STAGING;  Surgeon: Everitt Amber, MD;  Location: [facility];  Service: Gynecology;  Laterality: Bilateral;  . TOTAL KNEE ARTHROPLASTY  07/04/2011   Procedure: TOTAL KNEE ARTHROPLASTY;  Surgeon: Gearlean Alf, MD;  Location: [facility];  Service: Orthopedics;  Laterality:  Right;    Current Outpatient Prescriptions  Medication Sig Dispense Refill  . aspirin EC 81 MG tablet Take 81 mg by mouth at bedtime.     . Biotin (BIOTIN 5000) 5 MG CAPS Take 1 capsule by mouth every morning.     . brinzolamide (AZOPT) 1 % ophthalmic suspension Place 1 drop into the left eye 2 (two) times daily.    . Calcium Carbonate-Vitamin D (CALCIUM PLUS VITAMIN D PO) Take 1 tablet by mouth 2 (two) times daily.     . Coenzyme Q10 (CO Q 10) 100 MG CAPS Take 1 tablet by mouth at bedtime.     Marland Kitchen estradiol (ESTRACE) 0.1 MG/GM vaginal cream Place 2 g vaginally once a week.    Marland Kitchen EVENING PRIMROSE OIL PO Take 1,300 mg by mouth at bedtime.    . Ibuprofen (ADVIL) 200 MG CAPS Take 400 mg by mouth daily as needed (pain.).    . Magnesium Stearate POWD Take 2 tablets by mouth 2 (two) times  daily.    . Omega-3 Fatty Acids (FISH OIL) 1200 MG CAPS Take 1 capsule by mouth 2 (two) times daily.     . Probiotic Product (PROBIOTIC PO) Take 1 capsule by mouth at bedtime. Ultra Flora Balance.    . rivastigmine (EXELON) 1.5 MG capsule Take 1 capsule (1.5 mg total) by mouth 2 (two) times daily. 60 capsule 11   No current facility-administered medications for this visit.     Allergies as of 10/11/2016 - Review Complete 10/11/2016  Allergen Reaction Noted  . Oxycodone Anaphylaxis 06/18/2012  . Mold extract [trichophyton]  05/07/2016  . Codeine Nausea Only 07/11/2012    Vitals: BP 138/70 (BP Location: Right Arm, Patient Position: Sitting, Cuff Size: Normal)   Pulse 83   Ht 5\' 4"  (1.626 m)   Wt 121 lb 6.4 oz (55.1 kg)   LMP  (LMP Unknown)   BMI 20.84 kg/m  Last Weight:  Wt Readings from Last 1 Encounters:  10/11/16 121 lb 6.4 oz (55.1 kg)   Last Height:   Ht Readings from Last 1 Encounters:  10/11/16 5\' 4"  (1.626 m)   Physical exam: Exam: Gen: NAD, conversant, well nourised, obese, well groomed                     CV: RRR, no MRG. No Carotid Bruits. No peripheral edema, warm, nontender Eyes:  Conjunctivae clear without exudates or hemorrhage  Neuro: Detailed Neurologic Exam  Speech:    Speech is normal; fluent and spontaneous with normal comprehension.  Cognition:    The patient is oriented to person, place, and time;     recent and remote memory intact;     language fluent;     normal attention, concentration,     fund of knowledge Cranial Nerves:    The pupils are equal, round, and reactive to light. The fundi are normal and spontaneous venous pulsations are present. Visual fields are full to finger confrontation. Extraocular movements are intact. Trigeminal sensation is intact and the muscles of mastication are normal. The face is symmetric. The palate elevates in the midline. Hearing intact. Voice is normal. Shoulder shrug is normal. The tongue has normal motion without fasciculations.   Coordination:    Normal finger to nose and heel to shin. Normal rapid alternating movements.   Gait:    Heel-toe and tandem gait are normal.   Motor Observation:    No asymmetry, no atrophy, and no involuntary movements noted. Tone:    Normal muscle tone.    Posture:    Posture is normal. normal erect    Strength:    Strength is V/V in the upper and lower limbs.      Sensation: intact to LT     Reflex Exam:  DTR's:    Deep tendon reflexes in the upper and lower extremities are normal bilaterally.   Toes:    The toes are downgoing bilaterally.   Clonus:    Clonus is absent.  MMSE - Mini Mental State Exam 08/02/2015  Orientation to time 5  Orientation to Place 5  Registration 3  Attention/ Calculation 5  Recall 3  Language- name 2 objects 2  Language- repeat 1  Language- follow 3 step command 3  Language- read & follow direction 1  Write a sentence 1  Copy design 1  Total score 30   Montreal Cognitive Assessment  05/07/2016  Visuospatial/ Executive (0/5) 5  Naming (0/3) 3  Attention: Read list of digits (0/2) 2  Attention: Read list of letters (0/1) 1    Attention: Serial 7 subtraction starting at 100 (0/3) 3  Language: Repeat phrase (0/2) 2  Language : Fluency (0/1) 1  Abstraction (0/2) 1  Delayed Recall (0/5) 2  Orientation (0/6) 5  Total 25  Adjusted Score (based on education) 25      Assessment/Plan:This is an extremely lovely 82 year old patient who I know very well. I have been treating her for temporal arteritis and occipital headache. I have also seen her husband in clinic. They're very lovely, very active patients who looks much younger than their stated age. Her daughter and husband are here today as well. The family is concerned because she continues to decline cognitively and she is irritable with her family regarding her memory loss. Father had Alzheimer's in his 28s. She scored well on the Mini-Mental status exam and last appointment 30 out of 30 but on the Montral cognitive assessment she scored 25 out of 30 today. It does sound as though she has mild cognitive impairment likely Alzheimer's  - Formal neurocognitive testing diagnosed mild cognitive impairment. I am concerned given patient's history and she will continue to progress to Alzheimer's dementia. Discussed with family and answered questions. Increasing Exelon. They declined participating in any research trials such as Water quality scientist. - Discussed FDG PET brain scan, will hold off at this time - I recommend repeat formal neurocognitive testing in one year.   Sarina Ill, MD  [facility] [address] [place], [place] [address]  Phone [phone] Fax [phone]  A total of 35 minutes was spent face-to-face with this patient. Over half this time was spent on counseling patient on the MCI diagnosis and different diagnostic and therapeutic options available.

## 2016-11-27 ENCOUNTER — Other Ambulatory Visit: Payer: Self-pay | Admitting: Neurology

## 2017-01-28 ENCOUNTER — Telehealth: Payer: Self-pay | Admitting: Neurology

## 2017-01-28 NOTE — Telephone Encounter (Signed)
If she cannot tolerate any increase then continue current dose, pleae refill thanks

## 2017-01-28 NOTE — Telephone Encounter (Signed)
Called and spoke with patient. Discussed that per Dr. Jaynee Eagles if patient cannot tolerate any increase in Rivastigmine then continue current dose. She verbalized understanding and appreciation for the call. Patient has refills through 10/2017. She will call back if she has any issues getting a refill.

## 2017-01-28 NOTE — Telephone Encounter (Signed)
Patient calling to discuss dosage of  rivastigmine (EXELON) 1.5 MG capsule.

## 2017-01-28 NOTE — Telephone Encounter (Signed)
Called pt back and spoke with her. She states that she is currently tolerating Rivastigmine 1.5 mg BID but if she takes 2 capsules it makes her sick. She is due for a refill (has refills until 8/19) but wants to check with Dr. Jaynee Eagles first to see if she needs to make any changes or add Aricept 5 mg? I informed her I would consult Dr. Jaynee Eagles and call her back.

## 2017-06-12 ENCOUNTER — Other Ambulatory Visit: Payer: Self-pay | Admitting: Neurology

## 2017-07-02 ENCOUNTER — Telehealth: Payer: Self-pay | Admitting: Neurology

## 2017-07-02 NOTE — Telephone Encounter (Signed)
Janet Wilson, I am willing to see this patient on Monday June 3rd or Tuesday June 4th at 4 pm.  Would you call and schedule her please if this works for them?  Thank you.

## 2017-07-03 NOTE — Telephone Encounter (Signed)
Pt returning call, schedule appt for 6/4 at 4. Pt appreciated the call

## 2017-07-03 NOTE — Telephone Encounter (Signed)
I have called the pt and LVM for her to call back to schedule and appt.

## 2017-07-10 ENCOUNTER — Other Ambulatory Visit: Payer: Self-pay | Admitting: Internal Medicine

## 2017-07-10 DIAGNOSIS — E049 Nontoxic goiter, unspecified: Secondary | ICD-10-CM

## 2017-07-10 DIAGNOSIS — I739 Peripheral vascular disease, unspecified: Secondary | ICD-10-CM

## 2017-07-15 ENCOUNTER — Other Ambulatory Visit (INDEPENDENT_AMBULATORY_CARE_PROVIDER_SITE_OTHER): Payer: Self-pay

## 2017-07-15 ENCOUNTER — Other Ambulatory Visit: Payer: Self-pay | Admitting: Neurology

## 2017-07-15 DIAGNOSIS — Z79899 Other long term (current) drug therapy: Secondary | ICD-10-CM

## 2017-07-15 DIAGNOSIS — D34 Benign neoplasm of thyroid gland: Secondary | ICD-10-CM

## 2017-07-15 DIAGNOSIS — Z0289 Encounter for other administrative examinations: Secondary | ICD-10-CM

## 2017-07-16 ENCOUNTER — Ambulatory Visit: Payer: Medicare Other | Admitting: Neurology

## 2017-07-16 ENCOUNTER — Encounter: Payer: Self-pay | Admitting: Neurology

## 2017-07-16 VITALS — BP 133/71 | HR 78 | Ht 64.0 in | Wt 122.2 lb

## 2017-07-16 DIAGNOSIS — F688 Other specified disorders of adult personality and behavior: Secondary | ICD-10-CM | POA: Diagnosis not present

## 2017-07-16 DIAGNOSIS — R4701 Aphasia: Secondary | ICD-10-CM | POA: Diagnosis not present

## 2017-07-16 DIAGNOSIS — R4189 Other symptoms and signs involving cognitive functions and awareness: Secondary | ICD-10-CM | POA: Diagnosis not present

## 2017-07-16 DIAGNOSIS — R453 Demoralization and apathy: Secondary | ICD-10-CM

## 2017-07-16 DIAGNOSIS — G2581 Restless legs syndrome: Secondary | ICD-10-CM

## 2017-07-16 DIAGNOSIS — R413 Other amnesia: Secondary | ICD-10-CM | POA: Diagnosis not present

## 2017-07-16 MED ORDER — RIVASTIGMINE TARTRATE 3 MG PO CAPS: 3.0000 mg | ORAL_CAPSULE | Freq: Two times a day (BID) | ORAL | 11 refills | Status: DC

## 2017-07-16 NOTE — Progress Notes (Signed)
[facility]    Provider:  Dr Jaynee Eagles Referring Provider: Josetta Huddle, MD Primary Care Physician:  Josetta Huddle, MD  CC: Memory changes, word-finding difficult, behavior changes  Significant decline, agitation and frustration, changing personality, memory loss. She is having difficulty with words, she forgets words, she can't get it out, she sometimes has to describe the item she is thinking about. They are going swimming, loves [facility]. They do water aerobics.  Husband has had surgery andis getting over it. She is more active than they have been in the past. Husband is her Geneticist, molecular. Having trouble with words, she is trying to jot down words in a book and study them. She loses things or forget them somewhere. She may place her wallet in the garage and then forget it is there and she finds it. She left her phone in the garden. Short term is worsening. She doesn't remember appointments. She forgot about her blood tests. Still cooking and backing but very slow with directions, takes long to do complicated tasks she has to think more. Still driving. No accidents. She forgot how to put gas in the car, her husband had to show her.   Interval history 12/11/2016: Patient returns today with her daughter and husband to discuss neurocognitive testing which revealed mild cognitive impairment. I discussed that given her family history she is more at risk to progress to Alzheimer's disease. A portion of people with mild cognitive impairment do proceed to dementia. At this point I recommend trying to increase Exelon. Discussed clinical trials and patient is not interested. At a long discussion about mild cognitive impairment, things that we can do to further delineate such as FDG PET scans, other specialized scanning, research opportunities. Husband and daughter do feel as though she is more irritable and her short-term memory is worsening.  Interval history 05/07/2016: Janet Wilson  Burfeindis a 83 y.o.femalehere as a referral from Dr. Val Eagle memory problems. She has a past medical history of arthritis, glaucoma, temporal arteritis.She is here with daughter and husband. Started on Aricept at last appointment. She returns with worsening memory complaints. Father had Alzheimers in his 72s.  She has been taking Aricept 5 mg for 6 months, half of the 10 mg tab as the 10 mg tablet caused side effects. Patient's memory continues to decline. Had a discussion with husband and daughter, she is becoming more disoriented and in familiar places, she is losing things more, she is forgetting more appointments, dates and conversations.  Unfortunately patient denies any changes in his quite agitated with her family recently. Today I had to be very gentle when suggesting formal neurocognitive testing, I did tell her that this is something we should do especially in people with family history inordinate to make her upset. Family really wants her to have it done for baseline and to evaluate her current cognition which I think is a good idea. Also discussed with him our clinical trials [facility] today which I think she would be wonderful for if she agrees to be part of it.  NFA:[id number] A Burfeindis a 83 y.o.femalehere as a referral from Dr. Val Eagle memory problems. She has a past medical history of arthritis, glaucoma, temporal arteritis.She is here with daughter and husband if there is anything that she started to exhibit problems with her memory.She is having memory lapses, increased frustration especially when this is mentioned to her, some increased episodes of anger, crying episodes. There are no mood issues but possibly she feels defensive when they point  out. She doesn't remember. She is having episodes of memory loss, she forgot the details of her knee surgery. Also such as when she insists on things and then gets angry if she is proven wrong such as details on driving. Started  within the last 3 months. She never had this behavior in the past. Denies any mood changes or depression. Father had Alzheimers. She has lost her sense of smell 15 years ago. She forgets appointments, gets the wrong date, forgets the dates o events, misses appointments. Husband had always done the bills. She tried to send an email and couldn't figure it out. Pain in the back of the head is better. [name] how old her father was when he died, maybe 81, they were closed but he had suffered from dementia for many years without point. Patient reports she has [place] issues, gets confused especially with driving, some episodes of confusion. She insisted she had a knee replacement when she didn't. Balance is good but she has had a few falls mostly mechanical, where she trips over something, or when she was gardening and pulled too hard to get a weed up. No other focal neurologic deficits or complaints.  Review of Systems: Patient complains of symptoms per HPI as well as the following symptoms: Blurred vision. No shortness of breath or chest pain. Pertinent negatives per HPI. All others negative.  Social History   Socioeconomic History  . Marital status: Married    Spouse name: [name]  . Number of children: 2  . Years of education: college  . Highest education level: Not on file  Occupational History  . Occupation: retired    Fish farm manager: RETIRED  Social Needs  . Financial resource strain: Not on file  . Food insecurity:    Worry: Not on file    Inability: Not on file  . Transportation needs:    Medical: Not on file    Non-medical: Not on file  Tobacco Use  . Smoking status: Never Smoker  . Smokeless tobacco: Never Used  Substance and Sexual Activity  . Alcohol use: Yes    Comment: SELDOM, very little  . Drug use: No  . Sexual activity: Yes  Lifestyle  . Physical activity:    Days per week: Not on file    Minutes per session: Not on file  . Stress: Not on file  Relationships  .  Social connections:    Talks on phone: Not on file    Gets together: Not on file    Attends religious service: Not on file    Active member of club or organization: Not on file    Attends meetings of clubs or organizations: Not on file    Relationship status: Not on file  . Intimate partner violence:    Fear of current or ex partner: Not on file    Emotionally abused: Not on file    Physically abused: Not on file    Forced sexual activity: Not on file  Other Topics Concern  . Not on file  Social History Narrative   Pt lives at home with family.   Caffeine Use: Very little.    Patient is right handed     Family History  Problem Relation Age of Onset  . Alzheimer's disease Mother   . Heart attack Father     Past Medical History:  Diagnosis Date  . Arthritis    OA BOTH KNEES AND HANDS  . Atrial tachycardia (HCC)    HX OF  .  GERD (gastroesophageal reflux disease)   . Glaucoma    left eye  . Goiter    CAUSING COUGH, HOARSINESS AND DRY THROAT  . Headache(784.0)    MIGRAINES   . Overactive bladder   . Temporal arteritis [facility])     Past Surgical History:  Procedure Laterality Date  . ARTERY BIOPSY Right 07/21/2012   Procedure: BIOPSY TEMPORAL ARTERY;  Surgeon: Haywood Lasso, MD;  Location: [facility];  Service: General;  Laterality: Right;  . CERVICAL FUSION  2012  . EYE SURGERY     CATARACT EXTRACTION- BILATERAL  . INCONTINENCE SURGERY    . JOINT REPLACEMENT     right knee  . LEFT SHOULDER SURGERY    . RIGHT KNEE ARTHROSCOPY  11/2009    . ROBOTIC ASSISTED BILATERAL SALPINGO OOPHERECTOMY Bilateral 12/08/2013   Procedure: ROBOTIC ASSISTED LAPAROSCOPIC BILATERAL SALPINGO OOPHORECTOMY WITH STAGING;  Surgeon: Everitt Amber, MD;  Location: [facility];  Service: Gynecology;  Laterality: Bilateral;  . TOTAL KNEE ARTHROPLASTY  07/04/2011   Procedure: TOTAL KNEE ARTHROPLASTY;  Surgeon: Gearlean Alf, MD;  Location: [facility];  Service: Orthopedics;  Laterality: Right;    Current  Outpatient Medications  Medication Sig Dispense Refill  . aspirin EC 81 MG tablet Take 81 mg by mouth at bedtime.     . brinzolamide (AZOPT) 1 % ophthalmic suspension Place 1 drop into the left eye 2 (two) times daily.    . Calcium Carbonate-Vitamin D (CALCIUM PLUS VITAMIN D PO) Take 1 tablet by mouth 2 (two) times daily.     . Coenzyme Q10 (CO Q 10) 100 MG CAPS Take 1 tablet by mouth at bedtime.     Janet Wilson EVENING PRIMROSE OIL PO Take 1,300 mg by mouth at bedtime.    . Ibuprofen (ADVIL) 200 MG CAPS Take 400 mg by mouth daily as needed (pain.).    Janet Wilson Omega-3 Fatty Acids (FISH OIL) 1200 MG CAPS Take 1 capsule by mouth 2 (two) times daily.     . Probiotic Product (PROBIOTIC PO) Take 1 capsule by mouth at bedtime. Ultra Flora Balance.    . rivastigmine (EXELON) 3 MG capsule Take 1 capsule (3 mg total) by mouth 2 (two) times daily. 60 capsule 11   No current facility-administered medications for this visit.     Allergies as of 07/16/2017 - Review Complete 07/16/2017  Allergen Reaction Noted  . Oxycodone Anaphylaxis 06/18/2012  . Mold extract [trichophyton]  05/07/2016  . Codeine Nausea Only 07/11/2012    Vitals: BP 133/71   Pulse 78   Ht 5\' 4"  (1.626 m)   Wt 122 lb 3.2 oz (55.4 kg)   LMP  (LMP Unknown)   BMI 20.98 kg/m  Last Weight:  Wt Readings from Last 1 Encounters:  07/16/17 122 lb 3.2 oz (55.4 kg)   Last Height:   Ht Readings from Last 1 Encounters:  07/16/17 5\' 4"  (1.626 m)   Physical exam: Exam: Gen: slightly agitated, conversant, well nourised, obese, well groomed                     CV: RRR, no MRG. No Carotid Bruits. No peripheral edema, warm, nontender Eyes: Conjunctivae clear without exudates or hemorrhage  Neuro: Detailed Neurologic Exam  Speech:    Speech is normal; fluent and spontaneous with normal comprehension.  Cognition: MMSE 07/2015 30/30 MMSE 04/2016 25      The patient is oriented to person, place, and time;     recent and remote memory  impaired;      language fluent;     normal attention, concentration,     fund of knowledge Cranial Nerves:    The pupils are equal, round, and reactive to light. The fundi are normal and spontaneous venous pulsations are present. Visual fields are full to finger confrontation. Extraocular movements are intact. Trigeminal sensation is intact and the muscles of mastication are normal. The face is symmetric. The palate elevates in the midline. Hearing intact. Voice is normal. Shoulder shrug is normal. The tongue has normal motion without fasciculations.   Coordination:    Normal finger to nose and heel to shin. Normal rapid alternating movements.   Gait:    Heel-toe and tandem gait are normal.   Motor Observation:    No asymmetry, no atrophy, and no involuntary movements noted. Tone:    Normal muscle tone.    Posture:    Posture is normal. normal erect    Strength:    Strength is V/V in the upper and lower limbs.      Sensation: intact to LT     Reflex Exam:  DTR's:    Deep tendon reflexes in the upper and lower extremities are symmetrical bilaterally.   Toes:    The toes are downgoing bilaterally.   Clonus:    Clonus is absent.     Assessment/Plan:This is an extremely lovely 83 year old patient with significant cognitive changes for several years most pronounced in the last 8 months. There has a decline in IADLs and forgot how to pump gas, driving is questionable, having aphasia and personality changes, agitation, apathy towards her dementia diagnosis.  Her daughter and husband are here today as well. The family is concerned because she continues to decline cognitively and she is irritable with her family regarding her memory loss. Father had Alzheimer's in his 67s. MMSE 07/2015 30/30, MMSE 04/2016 25, 09/2016 diagnosed with MCI o funknown type  - Patient has features of both frontotemporal dementia and alzheimers (as above). Treatment and prognosis would be improved if we could accurately  diagnose her condition. Will order FDG PET SCAN.  - Formal neurocognitive testing diagnosed mild cognitive impairment. I am concerned given patient's worsening she has dementia at this time.  Increasing Exelon.   - I recommend repeat formal neurocognitive testing given her decline   - thyroid labs were ordered in advance of this appointment so we could review them, unremarkable slightly decreased TSH, will forward note to Dr. Inda Merlin (see below for lab results and orders for this appt)    Recent Results (from the past 2160 hour(s))  Comprehensive metabolic panel     Status: Abnormal   Collection Time: 07/15/17  3:00 PM  Result Value Ref Range   Glucose 112 (H) 65 - 99 mg/dL   BUN 24 8 - 27 mg/dL   Creatinine, Ser 0.92 0.57 - 1.00 mg/dL   GFR calc non Af Amer 59 (L) >59 mL/min/1.73   GFR calc Af Amer 68 >59 mL/min/1.73   BUN/Creatinine Ratio 26 12 - 28   Sodium 144 134 - 144 mmol/L   Potassium 4.0 3.5 - 5.2 mmol/L   Chloride 105 96 - 106 mmol/L   CO2 20 20 - 29 mmol/L   Calcium 9.2 8.7 - 10.3 mg/dL   Total Protein 6.2 6.0 - 8.5 g/dL   Albumin 4.1 3.5 - 4.7 g/dL   Globulin, Total 2.1 1.5 - 4.5 g/dL   Albumin/Globulin Ratio 2.0 1.2 - 2.2   Bilirubin Total 0.6 0.0 -  1.2 mg/dL   Alkaline Phosphatase 66 39 - 117 IU/L   AST 17 0 - 40 IU/L   ALT 13 0 - 32 IU/L  CBC     Status: None   Collection Time: 07/15/17  3:00 PM  Result Value Ref Range   WBC 5.6 3.4 - 10.8 x10E3/uL   RBC 4.33 3.77 - 5.28 x10E6/uL   Hemoglobin 13.3 11.1 - 15.9 g/dL   Hematocrit 40.3 34.0 - 46.6 %   MCV 93 79 - 97 fL   MCH 30.7 26.6 - 33.0 pg   MCHC 33.0 31.5 - 35.7 g/dL   RDW 13.6 12.3 - 15.4 %   Platelets 216 150 - 450 x10E3/uL  C-reactive protein     Status: Abnormal   Collection Time: 07/15/17  3:00 PM  Result Value Ref Range   CRP 6.2 (H) 0.0 - 4.9 mg/L    Comment:                **Effective July 29, 2017 the reference interval for**                  C-Reactive Protein, Quant, will be changing to:                                  Age             Female         Female                               0  - 30 days    Not Estab.   Not Estab.                          1 month - 17 years     0 -  7       0 -  9                                   >17 years     0 - 10       0 - 10   Sedimentation rate     Status: None   Collection Time: 07/15/17  3:00 PM  Result Value Ref Range   Sed Rate 20 0 - 40 mm/hr  T3     Status: None   Collection Time: 07/15/17  3:00 PM  Result Value Ref Range   T3, Total 72 71 - 180 ng/dL  T4, Free     Status: None   Collection Time: 07/15/17  3:00 PM  Result Value Ref Range   Free T4 1.18 0.82 - 1.77 ng/dL  Thyroid peroxidase antibody     Status: None   Collection Time: 07/15/17  3:00 PM  Result Value Ref Range   Thyroperoxidase Ab SerPl-aCnc 9 0 - 34 IU/mL  Thyroglobulin antibody     Status: None (Preliminary result)   Collection Time: 07/15/17  3:00 PM  Result Value Ref Range   Thyroglobulin Antibody WILL FOLLOW   Thyroid Panel With TSH     Status: Abnormal   Collection Time: 07/15/17  3:00 PM  Result Value Ref Range   TSH 0.391 (L) 0.450 - 4.500 uIU/mL   T4, Total 6.2 4.5 - 12.0  ug/dL   T3 Uptake Ratio 26 24 - 39 %   Free Thyroxine Index 1.6 1.2 - 4.9     Orders Placed This Encounter  Procedures  . NM PET Metabolic Brain  . Ambulatory referral to Neuropsychology   Cc: Dr. Evelina Dun, [facility] [address] [place], [place] [address]  Phone [phone] Fax [phone]  A total of 75  minutes was spent face-to-face with this patient. Over half this time was spent on counseling patient on the dementia diagnosis and different diagnostic and therapeutic options available.

## 2017-07-17 ENCOUNTER — Ambulatory Visit
Admission: RE | Admit: 2017-07-17 | Discharge: 2017-07-17 | Disposition: A | Discharge status: Home / Self Care | Payer: Medicare Other | Source: Ambulatory Visit | Attending: Internal Medicine | Admitting: Internal Medicine

## 2017-07-17 ENCOUNTER — Telehealth: Payer: Self-pay | Admitting: Neurology

## 2017-07-17 DIAGNOSIS — E049 Nontoxic goiter, unspecified: Secondary | ICD-10-CM

## 2017-07-17 DIAGNOSIS — I739 Peripheral vascular disease, unspecified: Secondary | ICD-10-CM

## 2017-07-17 NOTE — Telephone Encounter (Signed)
Called Patient and left her a message asking her to call me back .  PET scan is scheduled at [facility]  07/24/2017 arrive at 12:30 for 1:00 apt . Patient needs to be NPO 6 hours before she goes . Patient can take her medications at 7:00 am with a little a little bit of water. If patient can't make this apt she can call [phone] to reschedule a day that is good for her. Thanks Hinton Dyer.

## 2017-07-17 NOTE — Telephone Encounter (Signed)
Pt returning Dana's call, stating she has a few questions and would like a return call

## 2017-07-17 NOTE — Telephone Encounter (Signed)
Patient is going to call number below and get her date rescheduled.

## 2017-07-18 ENCOUNTER — Encounter: Payer: Self-pay | Admitting: Neurology

## 2017-07-19 LAB — CBC
Hematocrit: 40.3 % (ref 34.0–46.6)
Hemoglobin: 13.3 g/dL (ref 11.1–15.9)
MCH: 30.7 pg (ref 26.6–33.0)
MCHC: 33 g/dL (ref 31.5–35.7)
MCV: 93 fL (ref 79–97)
Platelets: 216 10*3/uL (ref 150–450)
RBC: 4.33 x10E6/uL (ref 3.77–5.28)
RDW: 13.6 % (ref 12.3–15.4)
WBC: 5.6 10*3/uL (ref 3.4–10.8)

## 2017-07-19 LAB — COMPREHENSIVE METABOLIC PANEL
ALT: 13 IU/L (ref 0–32)
AST: 17 IU/L (ref 0–40)
Albumin/Globulin Ratio: 2 (ref 1.2–2.2)
Albumin: 4.1 g/dL (ref 3.5–4.7)
Alkaline Phosphatase: 66 IU/L (ref 39–117)
BUN/Creatinine Ratio: 26 (ref 12–28)
BUN: 24 mg/dL (ref 8–27)
Bilirubin Total: 0.6 mg/dL (ref 0.0–1.2)
CO2: 20 mmol/L (ref 20–29)
Calcium: 9.2 mg/dL (ref 8.7–10.3)
Chloride: 105 mmol/L (ref 96–106)
Creatinine, Ser: 0.92 mg/dL (ref 0.57–1.00)
GFR calc Af Amer: 68 mL/min/{1.73_m2} (ref >59)
GFR calc non Af Amer: 59 mL/min/{1.73_m2} — ABNORMAL LOW (ref >59)
Globulin, Total: 2.1 g/dL (ref 1.5–4.5)
Glucose: 112 mg/dL — ABNORMAL HIGH (ref 65–99)
Potassium: 4 mmol/L (ref 3.5–5.2)
Sodium: 144 mmol/L (ref 134–144)
Total Protein: 6.2 g/dL (ref 6.0–8.5)

## 2017-07-19 LAB — T3: T3, Total: 72 ng/dL (ref 71–180)

## 2017-07-19 LAB — THYROID PANEL WITH TSH
Free Thyroxine Index: 1.6 (ref 1.2–4.9)
T3 Uptake Ratio: 26 % (ref 24–39)
T4, Total: 6.2 ug/dL (ref 4.5–12.0)
TSH: 0.391 u[IU]/mL — ABNORMAL LOW (ref 0.450–4.500)

## 2017-07-19 LAB — C-REACTIVE PROTEIN: CRP: 6.2 mg/L — ABNORMAL HIGH (ref 0.0–4.9)

## 2017-07-19 LAB — SEDIMENTATION RATE: Sed Rate: 20 mm/hr (ref 0–40)

## 2017-07-19 LAB — THYROID PEROXIDASE ANTIBODY: Thyroperoxidase Ab SerPl-aCnc: 9 IU/mL (ref 0–34)

## 2017-07-19 LAB — T4, FREE: Free T4: 1.18 ng/dL (ref 0.82–1.77)

## 2017-07-19 LAB — THYROGLOBULIN ANTIBODY: Thyroglobulin Antibody: <1 IU/mL (ref 0.0–0.9)

## 2017-07-24 ENCOUNTER — Ambulatory Visit (HOSPITAL_COMMUNITY): Payer: Medicare Other

## 2017-07-24 ENCOUNTER — Other Ambulatory Visit: Payer: Self-pay | Admitting: Neurology

## 2017-07-24 MED ORDER — RIVASTIGMINE TARTRATE 3 MG PO CAPS: 3.0000 mg | ORAL_CAPSULE | Freq: Two times a day (BID) | ORAL | 11 refills | Status: DC

## 2017-07-25 ENCOUNTER — Telehealth: Payer: Self-pay | Admitting: Neurology

## 2017-07-25 NOTE — Telephone Encounter (Signed)
Patient is scheduled for her PET scan 07/26/2017 at [facility]   . I have spoke to patient's  Husband  he is aware . Patient has seen Dr. Tawny Asal and he she has a follow up in one year.

## 2017-07-25 NOTE — Telephone Encounter (Signed)
Patient requested someone other than Dr. Si Raider for her formal neurocognitive testing thanks

## 2017-07-26 ENCOUNTER — Ambulatory Visit (HOSPITAL_COMMUNITY): Payer: Medicare Other

## 2017-07-30 NOTE — Telephone Encounter (Signed)
Patient is having her PET scan on the June 24 th and she is seeing Dr. Vikki Ports in Dec. Her and her family have talked about it and she is just going to keep that apt.

## 2017-08-05 ENCOUNTER — Telehealth: Payer: Self-pay | Admitting: Neurology

## 2017-08-05 ENCOUNTER — Telehealth: Payer: Self-pay | Admitting: *Deleted

## 2017-08-05 ENCOUNTER — Encounter (HOSPITAL_COMMUNITY)
Admission: RE | Admit: 2017-08-05 | Discharge: 2017-08-05 | Disposition: A | Discharge status: Home / Self Care | Payer: Medicare Other | Source: Ambulatory Visit | Attending: Neurology | Admitting: Neurology

## 2017-08-05 DIAGNOSIS — F688 Other specified disorders of adult personality and behavior: Secondary | ICD-10-CM | POA: Diagnosis present

## 2017-08-05 DIAGNOSIS — R453 Demoralization and apathy: Secondary | ICD-10-CM

## 2017-08-05 DIAGNOSIS — R4189 Other symptoms and signs involving cognitive functions and awareness: Secondary | ICD-10-CM

## 2017-08-05 DIAGNOSIS — R413 Other amnesia: Secondary | ICD-10-CM | POA: Diagnosis not present

## 2017-08-05 DIAGNOSIS — R4701 Aphasia: Secondary | ICD-10-CM

## 2017-08-05 LAB — GLUCOSE, CAPILLARY: Glucose-Capillary: 78 mg/dL (ref 65–99)

## 2017-08-05 MED ORDER — FLUDEOXYGLUCOSE F - 18 (FDG) INJECTION
10.0000 | Freq: Once | INTRAVENOUS | Status: AC
  Administered 2017-08-05: 10 via INTRAVENOUS

## 2017-08-05 NOTE — Telephone Encounter (Signed)
Hi dana, we were referring patient to Dr. Dianna Limbo group for neuropsych testing. She has not heard back from them. Would you look into this? thanks

## 2017-08-05 NOTE — Telephone Encounter (Addendum)
Called pt & discussed the good news that her pet met brain scan results were normal, no indication of alzheimer's disease. Also aware that our office is checking into the referral to neuropsych testing. Pt verbalized understanding and appreciation.   ----- Message from Melvenia Beam, MD sent at 08/05/2017 12:23 PM EDT ----- Exam was normal no indication of Alzheimers disease.

## 2017-08-07 ENCOUNTER — Other Ambulatory Visit: Payer: Self-pay | Admitting: Neurology

## 2017-08-07 DIAGNOSIS — G3184 Mild cognitive impairment, so stated: Secondary | ICD-10-CM

## 2017-08-20 ENCOUNTER — Telehealth: Payer: Self-pay | Admitting: Neurology

## 2017-08-20 NOTE — Telephone Encounter (Signed)
error 

## 2017-09-16 ENCOUNTER — Other Ambulatory Visit: Payer: Self-pay | Admitting: Neurology

## 2017-09-16 DIAGNOSIS — G2581 Restless legs syndrome: Secondary | ICD-10-CM

## 2017-09-16 MED ORDER — ROTIGOTINE 1 MG/24HR TD PT24: 1.0000 mg | MEDICATED_PATCH | Freq: Every day | TRANSDERMAL | 11 refills | Status: DC

## 2017-09-16 MED ORDER — ROTIGOTINE 1 MG/24HR TD PT24: 1.0000 mg | MEDICATED_PATCH | Freq: Every day | TRANSDERMAL | 0 refills | Status: DC

## 2017-09-16 NOTE — Progress Notes (Signed)
Patient with RLS, will try neupro patch, failed other meds, discussed risks including loss of inhibition stop for any problems

## 2017-09-16 NOTE — Addendum Note (Signed)
Addended by: Inis Sizer D on: 09/16/2017 12:17 PM   Modules accepted: Orders

## 2017-09-17 LAB — IRON AND TIBC
Iron Saturation: 24 % (ref 15–55)
Iron: 57 ug/dL (ref 27–139)
Total Iron Binding Capacity: 239 ug/dL — ABNORMAL LOW (ref 250–450)
UIBC: 182 ug/dL (ref 118–369)

## 2017-09-17 LAB — FERRITIN: Ferritin: 82 ng/mL (ref 15–150)

## 2017-10-09 DIAGNOSIS — M25561 Pain in right knee: Secondary | ICD-10-CM | POA: Insufficient documentation

## 2017-11-05 LAB — HM DEXA SCAN

## 2017-11-18 ENCOUNTER — Telehealth: Payer: Self-pay | Admitting: Neurology

## 2017-11-18 ENCOUNTER — Other Ambulatory Visit: Payer: Self-pay | Admitting: *Deleted

## 2017-11-18 MED ORDER — RIVASTIGMINE TARTRATE 1.5 MG PO CAPS: ORAL_CAPSULE | ORAL | 5 refills | Status: DC

## 2017-11-18 NOTE — Telephone Encounter (Signed)
Pt has called stating  [place], [place] [phone] (Phone) [phone] (Fax)   Is trying to reach the office re: pt's rivastigmine (EXELON) 3 MG capsule.  Pt states she wants to go back 1.5mg  pt is asking the pharmacy be called

## 2017-11-18 NOTE — Telephone Encounter (Signed)
Prescription for Rivastgmine 1.5 mg caps ordered with instructions to take 1 cap every morning and 2 caps every night at bedtime. Included note to pharmacy that prescription replaces the previous 3 mg prescription. D/c'd the Rivastigmine 3 mg capsule.

## 2017-11-18 NOTE — Telephone Encounter (Signed)
That's fine. thanks

## 2017-11-18 NOTE — Progress Notes (Signed)
Change in Rivastigmine dose.

## 2017-11-18 NOTE — Telephone Encounter (Signed)
Received paper request from [facility] which indicates that patient says the Rivastigmine 3 mg BID dose upsets her stomach and she requests to take 1.5 mg AM and 3 mg HS. Will send to Dr. Jaynee Eagles for approval of change in dose.

## 2017-12-11 LAB — BASIC METABOLIC PANEL
BUN: 17 (ref 4–21)
Creatinine: 0.8 (ref 0.5–1.1)
Glucose: 66
Potassium: 4.1 (ref 3.4–5.3)
Sodium: 142 (ref 137–147)

## 2017-12-11 LAB — HEPATIC FUNCTION PANEL
ALT: 17 (ref 7–35)
AST: 20 (ref 13–35)
Alkaline Phosphatase: 57 (ref 25–125)
Bilirubin, Total: 0.7

## 2017-12-11 LAB — CBC AND DIFFERENTIAL
HCT: 42 (ref 36–46)
Hemoglobin: 14.1 (ref 12.0–16.0)
Platelets: 172 (ref 150–399)
WBC: 5

## 2017-12-11 LAB — LIPID PANEL
Cholesterol: 194 (ref 0–200)
HDL: 59 (ref 35–70)
LDL Cholesterol: 125
Triglycerides: 53 (ref 40–160)

## 2017-12-11 LAB — VITAMIN B12: Vitamin B-12: 786

## 2017-12-11 LAB — TSH: TSH: 0.46 (ref 0.41–5.90)

## 2017-12-11 LAB — VITAMIN D 25 HYDROXY (VIT D DEFICIENCY, FRACTURES): Vit D, 25-Hydroxy: 78.5

## 2017-12-16 ENCOUNTER — Encounter

## 2017-12-16 ENCOUNTER — Ambulatory Visit: Payer: Medicare Other | Admitting: Neurology

## 2017-12-16 ENCOUNTER — Encounter: Payer: Self-pay | Admitting: Neurology

## 2017-12-16 VITALS — BP 118/56 | HR 96 | Ht 63.75 in | Wt 116.0 lb

## 2017-12-16 DIAGNOSIS — G2581 Restless legs syndrome: Secondary | ICD-10-CM | POA: Diagnosis not present

## 2017-12-16 DIAGNOSIS — G43711 Chronic migraine without aura, intractable, with status migrainosus: Secondary | ICD-10-CM

## 2017-12-16 DIAGNOSIS — M316 Other giant cell arteritis: Secondary | ICD-10-CM

## 2017-12-16 DIAGNOSIS — G3184 Mild cognitive impairment, so stated: Secondary | ICD-10-CM | POA: Diagnosis not present

## 2017-12-16 DIAGNOSIS — R252 Cramp and spasm: Secondary | ICD-10-CM

## 2017-12-16 MED ORDER — GABAPENTIN 100 MG PO CAPS: 100.0000 mg | ORAL_CAPSULE | Freq: Every evening | ORAL | 11 refills | Status: DC | PRN

## 2017-12-16 NOTE — Patient Instructions (Signed)
Rotigotine transdermal skin patch What is this medicine? ROTIGOTINE (roe TIG [place] teen) is used to control the signs and symptoms of Parkinson's disease or restless legs syndrome. This medicine may be used for other purposes; ask your health care provider or pharmacist if you have questions. COMMON BRAND NAME(S): Neupro What should I tell my health care provider before I take this medicine? They need to know if you have any of these conditions: -heart disease -high blood pressure -lung or breathing disease, like asthma -mental illness -skin cancer -sleep disorder -an unusual or allergic reaction to rotigotine, sulfites, other medicines, foods, dyes, or preservatives -pregnant or trying to get pregnant -breast-feeding How should I use this medicine? This medicine is for external use only. Follow the directions on the prescription label. Use exactly as directed. Wash hands after removing and applying this medicine. Change the patch each day at the same time. Apply the patch to an area of the upper arm or body that is clean, dry, and hairless. Do not use this patch on skin that is injured, irritated, oily, or calloused. Do not apply where the patch will be rubbed by tight clothing or a waistband. Do not apply to the same place more than once every 14 days in order to prevent skin irritation. Do not cut or trim the patch. Take your medicine at regular intervals. Do not take it more often than directed. Do not stop taking except on your doctor's advice. Always remove the old patch before you apply a new one. Remove patch slowly and carefully to avoid irritation. After removal, fold the patch so that it sticks to itself and throw it away. After removal of patch, wash the area with soap and water to remove any drug or adhesive. Baby oil or mineral oil may be used if needed. Do not use alcohol or other liquids. Talk to your pediatrician regarding the use of this medicine in children. Special care may  be needed. Overdosage: If you think you have taken too much of this medicine contact a poison control center or emergency room at once. NOTE: This medicine is only for you. Do not share this medicine with others. What if I miss a dose? If you miss a dose, take it as soon as you can. If it is almost time for your next dose, take only that dose. Do not take double or extra doses. What may interact with this medicine? -alcohol -antihistamines for allergy, cough and cold -certain medicines for sleep -medicines for depression, anxiety, or psychotic disturbances -metoclopramide -narcotic medicines for pain This list may not describe all possible interactions. Give your health care provider a list of all the medicines, herbs, non-prescription drugs, or dietary supplements you use. Also tell them if you smoke, drink alcohol, or use illegal drugs. Some items may interact with your medicine. What should I watch for while using this medicine? Visit your doctor for regular check ups. Tell your doctor or healthcare professional if your symptoms do not start to get better or if they get worse. You may get drowsy or dizzy. Do not drive, use machinery, or do anything that needs mental alertness until you know how this medicine affects you. Do not stand or sit up quickly, especially if you are an older patient. This reduces the risk of dizzy or fainting spells. Alcohol may interfere with the effect of this medicine. Avoid alcoholic drinks. If you find that you have sudden feelings of wanting to sleep during normal activities, like  cooking, watching television, or while driving or riding in a car, you should contact your health care professional. There have been reports of increased sexual urges or other strong urges such as gambling while taking this medicine. If you experience any of these while taking this medicine, you should report this to your health care provider as soon as possible. This medicine patch is  sensitive to certain body heat changes. If your skin gets too hot, more medicine will come out of the patch. Call your healthcare provider if you get a fever. Do not take hot baths. Do not sunbathe. Do not use hot tubs, saunas, hair dryers, heating pads, electric blankets, heated waterbeds, or tanning lamps. Do not do exercise that increases your body temperature. If you are going to have a magnetic resonance imaging (MRI) procedure, tell your MRI technician if you have this patch on your body. It must be removed before a MRI. What side effects may I notice from receiving this medicine? Side effects that you should report to your doctor or health care professional as soon as possible: -allergic reactions like skin rash, itching or hives, swelling of the face, lips, or tongue -anxiety, restlessness -breathing problems -confusion -dizziness -falling asleep during normal activities like driving -fast, irregular or slow heartbeat -feeling faint or lightheaded, falls -hallucination, loss of contact with reality -skin irritation, redness, or swelling -uncontrollable head, mouth, neck, arm, or leg movements -uncontrollable and excessive urges (examples: gambling, binge eating, shopping, having sex) Side effects that usually do not require medical attention (report to your doctor or health care professional if they continue or are bothersome): -constipation -difficulty sleeping -headache -loss of appetite -nausea, vomiting -stomach pain -weight gain This list may not describe all possible side effects. Call your doctor for medical advice about side effects. You may report side effects to FDA at 1-800-FDA-1088. Where should I keep my medicine? Keep out of the reach of children. Store at room temperature between 15 and 30 degrees C (59 and 86 degrees F). Keep container tightly closed. Store in original pouch until just before use. Throw away any unused medicine after the expiration date. NOTE: This  sheet is a summary. It may not cover all possible information. If you have questions about this medicine, talk to your doctor, pharmacist, or health care provider.  2018 [employer]/Gold Standard (2015-09-09 15:25:23) Gabapentin capsules or tablets What is this medicine? GABAPENTIN (GA ba pen tin) is used to control partial seizures in adults with epilepsy. It is also used to treat certain types of nerve pain. This medicine may be used for other purposes; ask your health care provider or pharmacist if you have questions. COMMON BRAND NAME(S): Active-PAC with Gabapentin, Gabarone, Neurontin What should I tell my health care provider before I take this medicine? They need to know if you have any of these conditions: -kidney disease -suicidal thoughts, plans, or attempt; a previous suicide attempt by you or a family member -an unusual or allergic reaction to gabapentin, other medicines, foods, dyes, or preservatives -pregnant or trying to get pregnant -breast-feeding How should I use this medicine? Take this medicine by mouth with a glass of water. Follow the directions on the prescription label. You can take it with or without food. If it upsets your stomach, take it with food.Take your medicine at regular intervals. Do not take it more often than directed. Do not stop taking except on your doctor's advice. If you are directed to break the 600 or 800 mg tablets in half  as part of your dose, the extra half tablet should be used for the next dose. If you have not used the extra half tablet within 28 days, it should be thrown away. A special MedGuide will be given to you by the pharmacist with each prescription and refill. Be sure to read this information carefully each time. Talk to your pediatrician regarding the use of this medicine in children. Special care may be needed. Overdosage: If you think you have taken too much of this medicine contact a poison control center or emergency room at  once. NOTE: This medicine is only for you. Do not share this medicine with others. What if I miss a dose? If you miss a dose, take it as soon as you can. If it is almost time for your next dose, take only that dose. Do not take double or extra doses. What may interact with this medicine? Do not take this medicine with any of the following medications: -other gabapentin products This medicine may also interact with the following medications: -alcohol -antacids -antihistamines for allergy, cough and cold -certain medicines for anxiety or sleep -certain medicines for depression or psychotic disturbances -homatropine; hydrocodone -naproxen -narcotic medicines (opiates) for pain -phenothiazines like chlorpromazine, mesoridazine, prochlorperazine, thioridazine This list may not describe all possible interactions. Give your health care provider a list of all the medicines, herbs, non-prescription drugs, or dietary supplements you use. Also tell them if you smoke, drink alcohol, or use illegal drugs. Some items may interact with your medicine. What should I watch for while using this medicine? Visit your doctor or health care professional for regular checks on your progress. You may want to keep a record at home of how you feel your condition is responding to treatment. You may want to share this information with your doctor or health care professional at each visit. You should contact your doctor or health care professional if your seizures get worse or if you have any new types of seizures. Do not stop taking this medicine or any of your seizure medicines unless instructed by your doctor or health care professional. Stopping your medicine suddenly can increase your seizures or their severity. Wear a medical identification bracelet or chain if you are taking this medicine for seizures, and carry a card that lists all your medications. You may get drowsy, dizzy, or have blurred vision. Do not drive, use  machinery, or do anything that needs mental alertness until you know how this medicine affects you. To reduce dizzy or fainting spells, do not sit or stand up quickly, especially if you are an older patient. Alcohol can increase drowsiness and dizziness. Avoid alcoholic drinks. Your mouth may get dry. Chewing sugarless gum or sucking hard candy, and drinking plenty of water will help. The use of this medicine may increase the chance of suicidal thoughts or actions. Pay special attention to how you are responding while on this medicine. Any worsening of mood, or thoughts of suicide or dying should be reported to your health care professional right away. Women who become pregnant while using this medicine may enroll in the [place] Pregnancy Registry by calling [phone]. This registry collects information about the safety of antiepileptic drug use during pregnancy. What side effects may I notice from receiving this medicine? Side effects that you should report to your doctor or health care professional as soon as possible: -allergic reactions like skin rash, itching or hives, swelling of the face, lips, or tongue -worsening of mood,  thoughts or actions of suicide or dying Side effects that usually do not require medical attention (report to your doctor or health care professional if they continue or are bothersome): -constipation -difficulty walking or controlling muscle movements -dizziness -nausea -slurred speech -tiredness -tremors -weight gain This list may not describe all possible side effects. Call your doctor for medical advice about side effects. You may report side effects to FDA at 1-800-FDA-1088. Where should I keep my medicine? Keep out of reach of children. This medicine may cause accidental overdose and death if it taken by other adults, children, or pets. Mix any unused medicine with a substance like cat litter or coffee grounds. Then throw the  medicine away in a sealed container like a sealed bag or a coffee can with a lid. Do not use the medicine after the expiration date. Store at room temperature between 15 and 30 degrees C (59 and 86 degrees F). NOTE: This sheet is a summary. It may not cover all possible information. If you have questions about this medicine, talk to your doctor, pharmacist, or health care provider.  2018 [employer]vier/Gold Standard (2013-03-27 15:26:50)

## 2017-12-16 NOTE — Progress Notes (Signed)
[name]    Provider:  Dr Jaynee Eagles Referring Provider: Josetta Huddle, MD Primary Care Physician:  Josetta Huddle, MD  CC: Memory changes, word-finding difficult, behavio  Interval history 12/16/2017: She is improved. And formal neurocognitive testing did not show progression, in fact improvement. Here with daughter who provides information. The last testing was improved, normal as far as cognition. At night with nsomnia she thinks of movie stars and movies. She is doing well on the neupro patch forRLS. She gets cramps at night. Will try gabapentin for RLS and also for her cramping. She feels has difficulty with expression, she stammers, recommended speech therapy.   07/2017: Significant decline, agitation and frustration, changing personality, memory loss. She is having difficulty with words, she forgets words, she can't get it out, she sometimes has to describe the item she is thinking about. They are going swimming, loves [facility]. They do water aerobics.  Husband has had surgery andis getting over it. She is more active than they have been in the past. Husband is her Geneticist, molecular. Having trouble with words, she is trying to jot down words in a book and study them. She loses things or forget them somewhere. She may place her wallet in the garage and then forget it is there and she finds it. She left her phone in the garden. Short term is worsening. She doesn't remember appointments. She forgot about her blood tests. Still cooking and backing but very slow with directions, takes long to do complicated tasks she has to think more. Still driving. No accidents. She forgot how to put gas in the car, her husband had to show her.   Interval history 12/11/2016: Patient returns today with her daughter and husband to discuss neurocognitive testing which revealed mild cognitive impairment. I discussed that given her family history she is more at risk to progress to Alzheimer's  disease. A portion of people with mild cognitive impairment do proceed to dementia. At this point I recommend trying to increase Exelon. Discussed clinical trials and patient is not interested. At a long discussion about mild cognitive impairment, things that we can do to further delineate such as FDG PET scans, other specialized scanning, research opportunities. Husband and daughter do feel as though she is more irritable and her short-term memory is worsening.  Interval history 05/07/2016: Janet Wilson Wilson a 83 y.o.femalehere as a referral from Dr. Val Eagle memory problems. She has a past medical history of arthritis, glaucoma, temporal arteritis.She is here with daughter and husband. Started on Aricept at last appointment. She returns with worsening memory complaints. Father had Alzheimers in his 61s.  She has been taking Aricept 5 mg for 6 months, half of the 10 mg tab as the 10 mg tablet caused side effects. Patient's memory continues to decline. Had a discussion with husband and daughter, she is becoming more disoriented and in familiar places, she is losing things more, she is forgetting more appointments, dates and conversations.  Unfortunately patient denies any changes in his quite agitated with her family recently. Today I had to be very gentle when suggesting formal neurocognitive testing, I did tell her that this is something we should do especially in people with family history inordinate to make her upset. Family really wants her to have it done for baseline and to evaluate her current cognition which I think is a good idea. Also discussed with him our clinical trials [facility] today which I think she would be wonderful for if she agrees to be  part of it.  XBM:Janet Wilson Wilson a 83 y.o.femalehere as a referral from Dr. Val Eagle memory problems. She has a past medical history of arthritis, glaucoma, temporal arteritis.She is here with daughter and husband if there is  anything that she started to exhibit problems with her memory.She is having memory lapses, increased frustration especially when this is mentioned to her, some increased episodes of anger, crying episodes. There are no mood issues but possibly she feels defensive when they point out. She doesn't remember. She is having episodes of memory loss, she forgot the details of her knee surgery. Also such as when she insists on things and then gets angry if she is proven wrong such as details on driving. Started within the last 3 months. She never had this behavior in the past. Denies any mood changes or depression. Father had Alzheimers. She has lost her sense of smell 15 years ago. She forgets appointments, gets the wrong date, forgets the dates o events, misses appointments. Husband had always done the bills. She tried to send an email and couldn't figure it out. Pain in the back of the head is better. [name] how old her father was when he died, maybe 25, they were closed but he had suffered from dementia for many years without point. Patient reports she has [place] issues, gets confused especially with driving, some episodes of confusion. She insisted she had a knee replacement when she didn't. Balance is good but she has had a few falls mostly mechanical, where she trips over something, or when she was gardening and pulled too hard to get a weed up. No other focal neurologic deficits or complaints.  Review of Systems: Patient complains of symptoms per HPI as well as the following symptoms: Blurred vision. No shortness of breath or chest pain. Pertinent negatives per HPI. All others negative.  Social History   Socioeconomic History  . Marital status: Married    Spouse name: [name]  . Number of children: 2  . Years of education: college  . Highest education level: Not on file  Occupational History  . Occupation: retired    Fish farm manager: RETIRED  Social Needs  . Financial resource strain: Not on file    . Food insecurity:    Worry: Not on file    Inability: Not on file  . Transportation needs:    Medical: Not on file    Non-medical: Not on file  Tobacco Use  . Smoking status: Never Smoker  . Smokeless tobacco: Never Used  Substance and Sexual Activity  . Alcohol use: Yes    Comment: SELDOM, very little  . Drug use: No  . Sexual activity: Yes  Lifestyle  . Physical activity:    Days per week: Not on file    Minutes per session: Not on file  . Stress: Not on file  Relationships  . Social connections:    Talks on phone: Not on file    Gets together: Not on file    Attends religious service: Not on file    Active member of club or organization: Not on file    Attends meetings of clubs or organizations: Not on file    Relationship status: Not on file  . Intimate partner violence:    Fear of current or ex partner: Not on file    Emotionally abused: Not on file    Physically abused: Not on file    Forced sexual activity: Not on file  Other Topics Concern  . Not  on file  Social History Narrative   Pt lives at home with family.   Caffeine Use: Very little.    Patient is right handed     Family History  Problem Relation Age of Onset  . Alzheimer's disease Mother   . Heart attack Father     Past Medical History:  Diagnosis Date  . Arthritis    OA BOTH KNEES AND HANDS  . Atrial tachycardia (HCC)    HX OF  . GERD (gastroesophageal reflux disease)   . Glaucoma    left eye  . Goiter    CAUSING COUGH, HOARSINESS AND DRY THROAT  . Headache(784.0)    MIGRAINES   . Overactive bladder   . Temporal arteritis [facility])     Past Surgical History:  Procedure Laterality Date  . ARTERY BIOPSY Right 07/21/2012   Procedure: BIOPSY TEMPORAL ARTERY;  Surgeon: Haywood Lasso, MD;  Location: [facility];  Service: General;  Laterality: Right;  . CERVICAL FUSION  2012  . EYE SURGERY     CATARACT EXTRACTION- BILATERAL  . INCONTINENCE SURGERY    . JOINT REPLACEMENT     right knee  .  LEFT SHOULDER SURGERY    . RIGHT KNEE ARTHROSCOPY  11/2009    . ROBOTIC ASSISTED BILATERAL SALPINGO OOPHERECTOMY Bilateral 12/08/2013   Procedure: ROBOTIC ASSISTED LAPAROSCOPIC BILATERAL SALPINGO OOPHORECTOMY WITH STAGING;  Surgeon: Everitt Amber, MD;  Location: [facility];  Service: Gynecology;  Laterality: Bilateral;  . TOTAL KNEE ARTHROPLASTY  07/04/2011   Procedure: TOTAL KNEE ARTHROPLASTY;  Surgeon: Gearlean Alf, MD;  Location: [facility];  Service: Orthopedics;  Laterality: Right;    Current Outpatient Medications  Medication Sig Dispense Refill  . aspirin EC 81 MG tablet Take 81 mg by mouth at bedtime.     . Calcium Carbonate-Vitamin D (CALCIUM PLUS VITAMIN D PO) Take 1 tablet by mouth 2 (two) times daily.     . Coenzyme Q10 (CO Q 10) 100 MG CAPS Take 1 tablet by mouth at bedtime.     . dorzolamide (TRUSOPT) 2 % ophthalmic solution     . Ibuprofen (ADVIL) 200 MG CAPS Take 400 mg by mouth daily as needed (pain.).    Marland Kitchen Omega-3 Fatty Acids (FISH OIL) 1200 MG CAPS Take 1 capsule by mouth 2 (two) times daily.     . Probiotic Product (PROBIOTIC PO) Take 1 capsule by mouth at bedtime. Ultra Flora Balance.    . rivastigmine (EXELON) 1.5 MG capsule Take 1 capsule (1.5 mg) by mouth every morning and 2 capsules (3 mg) by mouth daily at bedtime. (Patient taking differently: Take 1.5 mg by mouth every morning. ) 90 capsule 5  . rivastigmine (EXELON) 3 MG capsule Take 3 mg by mouth at bedtime.    . Rotigotine (NEUPRO) 1 MG/24HR PT24 Place 1 patch (1 mg total) onto the skin at bedtime. 30 patch 11  . EVENING PRIMROSE OIL PO Take 1,300 mg by mouth at bedtime.     No current facility-administered medications for this visit.     Allergies as of 12/16/2017 - Review Complete 12/16/2017  Allergen Reaction Noted  . Oxycodone Anaphylaxis 06/18/2012  . Mold extract [trichophyton]  05/07/2016  . Codeine Nausea Only 07/11/2012    Vitals: BP (!) 118/56 (BP Location: Right Arm, Patient Position: Sitting)   Pulse  96   Ht 5' 3.75" (1.619 m)   Wt 116 lb (52.6 kg)   LMP  (LMP Unknown)   BMI 20.07 kg/m  Last Weight:  Wt Readings from Last 1 Encounters:  12/16/17 116 lb (52.6 kg)   Last Height:   Ht Readings from Last 1 Encounters:  12/16/17 5' 3.75" (1.619 m)   Physical exam: Exam: Gen: slightly agitated, conversant, well nourised, obese, well groomed                     CV: RRR, no MRG. No Carotid Bruits. No peripheral edema, warm, nontender Eyes: Conjunctivae clear without exudates or hemorrhage  Neuro: Detailed Neurologic Exam  Speech:    Speech is normal; fluent and spontaneous with normal comprehension.  Cognition: MMSE 07/2015 30/30 MMSE 04/2016 25      The patient is oriented to person, place, and time;     recent and remote memory impaired;     language fluent;     normal attention, concentration,     fund of knowledge Cranial Nerves:    The pupils are equal, round, and reactive to light. The fundi are normal and spontaneous venous pulsations are present. Visual fields are full to finger confrontation. Extraocular movements are intact. Trigeminal sensation is intact and the muscles of mastication are normal. The face is symmetric. The palate elevates in the midline. Hearing intact. Voice is normal. Shoulder shrug is normal. The tongue has normal motion without fasciculations.   Coordination:    Normal finger to nose and heel to shin. Normal rapid alternating movements.   Gait:    Heel-toe and tandem gait are normal.   Motor Observation:    No asymmetry, no atrophy, and no involuntary movements noted. Tone:    Normal muscle tone.    Posture:    Posture is normal. normal erect    Strength:    Strength is V/V in the upper and lower limbs.      Sensation: intact to LT     Reflex Exam:  DTR's:    Deep tendon reflexes in the upper and lower extremities are symmetrical bilaterally.   Toes:    The toes are downgoing bilaterally.   Clonus:    Clonus is absent.      Assessment/Plan:This is an extremely lovely 76year-old patient Father had Alzheimer's in his 55s. MMSE 07/2015 30/30, MMSE 04/2016 25, 09/2016 diagnosed with MCI but more recently neurocognitive testing normal.   Memory improved. Continue exelon  RLS continue neupro patch and try gabapentin for cramps  Hx of temporal arteritis, monitor with labs  Migraines stable  Orders Placed This Encounter  Procedures  . Sedimentation rate  . C-reactive protein   Meds ordered this encounter  Medications  . gabapentin (NEURONTIN) 100 MG capsule    Sig: Take 1 capsule (100 mg total) by mouth at bedtime as needed. For restless legs and cramping    Dispense:  30 capsule    Refill:  11      Cc: Dr. Evelina Dun, MD  [facility] [address] [place], [place] [address]  Phone [phone] Fax [phone]  A total of 25 minutes was spent face-to-face with this patient. Over half this time was spent on counseling patient on the  1. Chronic migraine without aura, with intractable migraine, so stated, with status migrainosus   2. Temporal arteritis ([place])    diagnosis and different diagnostic and therapeutic options available.

## 2017-12-17 LAB — SEDIMENTATION RATE: Sed Rate: 2 mm/hr (ref 0–40)

## 2017-12-17 LAB — C-REACTIVE PROTEIN: CRP: 4 mg/L (ref 0–10)

## 2017-12-19 DIAGNOSIS — G2581 Restless legs syndrome: Secondary | ICD-10-CM | POA: Insufficient documentation

## 2018-02-25 ENCOUNTER — Other Ambulatory Visit: Payer: Self-pay | Admitting: Radiology

## 2018-02-25 DIAGNOSIS — N6320 Unspecified lump in the left breast, unspecified quadrant: Secondary | ICD-10-CM

## 2018-02-27 ENCOUNTER — Encounter: Payer: Self-pay | Admitting: Pulmonary Disease

## 2018-02-27 ENCOUNTER — Ambulatory Visit: Payer: Medicare Other | Admitting: Pulmonary Disease

## 2018-02-27 ENCOUNTER — Other Ambulatory Visit (HOSPITAL_COMMUNITY): Payer: Self-pay

## 2018-02-27 VITALS — BP 123/70 | HR 82 | Ht 64.0 in | Wt 117.6 lb

## 2018-02-27 DIAGNOSIS — R05 Cough: Secondary | ICD-10-CM

## 2018-02-27 DIAGNOSIS — R1310 Dysphagia, unspecified: Secondary | ICD-10-CM

## 2018-02-27 DIAGNOSIS — K219 Gastro-esophageal reflux disease without esophagitis: Secondary | ICD-10-CM | POA: Diagnosis not present

## 2018-02-27 DIAGNOSIS — R059 Cough, unspecified: Secondary | ICD-10-CM

## 2018-02-27 MED ORDER — RANITIDINE HCL 150 MG PO TABS: 150.0000 mg | ORAL_TABLET | Freq: Two times a day (BID) | ORAL | 3 refills | Status: DC

## 2018-02-27 NOTE — Patient Instructions (Addendum)
Thank you for visiting Dr. Valeta Harms at [facility]. Today we recommend the following: Orders Placed This Encounter  Procedures  . DG ESOPHAGUS INC SCOUT CHEST & DELAYED IMG SINGLE CM (BA OR SOL)  . SLP modified barium swallow   Meds ordered this encounter  Medications  . ranitidine (ZANTAC) 150 MG tablet    Sig: Take 1 tablet (150 mg total) by mouth 2 (two) times daily.    Dispense:  30 tablet    Refill:  3   Return in about 4 weeks (around 03/27/2018). Please schedule F/U after Barium Swallow and Esophagram.

## 2018-02-27 NOTE — Progress Notes (Signed)
Synopsis: Referred in Jan 2020 for cough, sputum production by Janet Huddle, MD  Subjective:   PATIENT ID: Janet Wilson GENDER: female DOB: 05-25-1934, MRN: [id number]  Chief Complaint  Patient presents with  . Consult    Pt states she's been having hoarseness, coughing w/ mucus yellow/greenish black specks; has to clear throat constantly    PMH of GERD, arthritis, MCI on aricept, she complains of daily sputum production, occasionally has black flecks in the sputum. She always has to clear her throat. She feels a thickness in the back of her throat. She has lost about 5-6 llbs. She has noticed running of her nose with eating. She puts drops in her eyes. Cough has been going on for several years. Smallest things will make her cough and choke.  Patient denies fevers, chills, night sweats.  Has not noticed any episodes of wheezing.  Denies chronic congestion, nasal drip symptoms.   Past Medical History:  Diagnosis Date  . Arthritis    OA BOTH KNEES AND HANDS  . Atrial tachycardia (HCC)    HX OF  . GERD (gastroesophageal reflux disease)   . Glaucoma    left eye  . Goiter    CAUSING COUGH, HOARSINESS AND DRY THROAT  . Headache(784.0)    MIGRAINES   . Overactive bladder   . Temporal arteritis (HCC)      Family History  Problem Relation Age of Onset  . Alzheimer's disease Mother   . Heart attack Father      Past Surgical History:  Procedure Laterality Date  . ARTERY BIOPSY Right 07/21/2012   Procedure: BIOPSY TEMPORAL ARTERY;  Surgeon: Haywood Lasso, MD;  Location: [facility];  Service: General;  Laterality: Right;  . CERVICAL FUSION  2012  . EYE SURGERY     CATARACT EXTRACTION- BILATERAL  . INCONTINENCE SURGERY    . JOINT REPLACEMENT     right knee  . LEFT SHOULDER SURGERY    . RIGHT KNEE ARTHROSCOPY  11/2009    . ROBOTIC ASSISTED BILATERAL SALPINGO OOPHERECTOMY Bilateral 12/08/2013   Procedure: ROBOTIC ASSISTED LAPAROSCOPIC BILATERAL SALPINGO OOPHORECTOMY WITH  STAGING;  Surgeon: Everitt Amber, MD;  Location: [facility];  Service: Gynecology;  Laterality: Bilateral;  . TOTAL KNEE ARTHROPLASTY  07/04/2011   Procedure: TOTAL KNEE ARTHROPLASTY;  Surgeon: Gearlean Alf, MD;  Location: [facility];  Service: Orthopedics;  Laterality: Right;    Social History   Socioeconomic History  . Marital status: Married    Spouse name: [name]  . Number of children: 2  . Years of education: college  . Highest education level: Not on file  Occupational History  . Occupation: retired    Fish farm manager: RETIRED  Social Needs  . Financial resource strain: Not on file  . Food insecurity:    Worry: Not on file    Inability: Not on file  . Transportation needs:    Medical: Not on file    Non-medical: Not on file  Tobacco Use  . Smoking status: Never Smoker  . Smokeless tobacco: Never Used  Substance and Sexual Activity  . Alcohol use: Yes    Comment: SELDOM, very little  . Drug use: No  . Sexual activity: Yes  Lifestyle  . Physical activity:    Days per week: Not on file    Minutes per session: Not on file  . Stress: Not on file  Relationships  . Social connections:    Talks on phone: Not on file    Gets  together: Not on file    Attends religious service: Not on file    Active member of club or organization: Not on file    Attends meetings of clubs or organizations: Not on file    Relationship status: Not on file  . Intimate partner violence:    Fear of current or ex partner: Not on file    Emotionally abused: Not on file    Physically abused: Not on file    Forced sexual activity: Not on file  Other Topics Concern  . Not on file  Social History Narrative   Pt lives at home with family.   Caffeine Use: Very little.    Patient is right handed      Allergies  Allergen Reactions  . Oxycodone Anaphylaxis  . Mold Extract [Trichophyton]     headache migraine  . Codeine Nausea Only     Outpatient Medications Prior to Visit  Medication Sig Dispense Refill   . aspirin EC 81 MG tablet Take 81 mg by mouth at bedtime.     . Calcium Carbonate-Vitamin D (CALCIUM PLUS VITAMIN D PO) Take 1 tablet by mouth 2 (two) times daily.     . Coenzyme Q10 (CO Q 10) 100 MG CAPS Take 1 tablet by mouth at bedtime.     . dorzolamide (TRUSOPT) 2 % ophthalmic solution     . EVENING PRIMROSE OIL PO Take 1,300 mg by mouth at bedtime.    . Ibuprofen (ADVIL) 200 MG CAPS Take 400 mg by mouth daily as needed (pain.).    Marland Kitchen Omega-3 Fatty Acids (FISH OIL) 1200 MG CAPS Take 1 capsule by mouth 2 (two) times daily.     . Probiotic Product (PROBIOTIC PO) Take 1 capsule by mouth at bedtime. Ultra Flora Balance.    . rivastigmine (EXELON) 1.5 MG capsule Take 1 capsule (1.5 mg) by mouth every morning and 2 capsules (3 mg) by mouth daily at bedtime. (Patient taking differently: Take 1.5 mg by mouth every morning. ) 90 capsule 5  . rivastigmine (EXELON) 3 MG capsule Take 3 mg by mouth at bedtime.    . Rotigotine (NEUPRO) 1 MG/24HR PT24 Place 1 patch (1 mg total) onto the skin at bedtime. 30 patch 11  . gabapentin (NEURONTIN) 100 MG capsule Take 1 capsule (100 mg total) by mouth at bedtime as needed. For restless legs and cramping (Patient not taking: Reported on 02/27/2018) 30 capsule 11   No facility-administered medications prior to visit.     Review of Systems  Constitutional: Negative for chills, fever, malaise/fatigue and weight loss.  HENT: Negative for hearing loss, sore throat and tinnitus.   Eyes: Negative for blurred vision and double vision.  Respiratory: Positive for cough and sputum production. Negative for hemoptysis, shortness of breath, wheezing and stridor.   Cardiovascular: Negative for chest pain, palpitations, orthopnea, leg swelling and PND.  Gastrointestinal: Negative for abdominal pain, constipation, diarrhea, heartburn, nausea and vomiting.  Genitourinary: Negative for dysuria, hematuria and urgency.  Musculoskeletal: Negative for joint pain and myalgias.  Skin:  Negative for itching and rash.  Neurological: Negative for dizziness, tingling, weakness and headaches.  Endo/Heme/Allergies: Negative for environmental allergies. Does not bruise/bleed easily.  Psychiatric/Behavioral: Negative for depression. The patient is not nervous/anxious and does not have insomnia.   All other systems reviewed and are negative.    Objective:  Physical Exam Vitals signs reviewed.  Constitutional:      General: She is not in acute distress.    Appearance:  She is well-developed.  HENT:     Head: Normocephalic and atraumatic.  Eyes:     General: No scleral icterus.    Conjunctiva/sclera: Conjunctivae normal.     Pupils: Pupils are equal, round, and reactive to light.  Neck:     Musculoskeletal: Neck supple.     Vascular: No JVD.     Trachea: No tracheal deviation.  Cardiovascular:     Rate and Rhythm: Normal rate and regular rhythm.     Heart sounds: Normal heart sounds. No murmur.  Pulmonary:     Effort: Pulmonary effort is normal. No tachypnea, accessory muscle usage or respiratory distress.     Breath sounds: Normal breath sounds. No stridor. No wheezing, rhonchi or rales.  Abdominal:     General: Bowel sounds are normal. There is no distension.     Palpations: Abdomen is soft.     Tenderness: There is no abdominal tenderness.  Musculoskeletal:        General: No tenderness.  Lymphadenopathy:     Cervical: No cervical adenopathy.  Skin:    General: Skin is warm and dry.     Capillary Refill: Capillary refill takes less than 2 seconds.     Findings: No rash.  Neurological:     Mental Status: She is alert and oriented to person, place, and time.  Psychiatric:        Behavior: Behavior normal.      Vitals:   02/27/18 1059  BP: 123/70  Pulse: 82  SpO2: 97%  Weight: 117 lb 9.6 oz (53.3 kg)  Height: 5\' 4"  (1.626 m)   97% on RA BMI Readings from Last 3 Encounters:  02/27/18 20.19 kg/m  12/16/17 20.07 kg/m  07/16/17 20.98 kg/m   Wt  Readings from Last 3 Encounters:  02/27/18 117 lb 9.6 oz (53.3 kg)  12/16/17 116 lb (52.6 kg)  07/16/17 122 lb 3.2 oz (55.4 kg)     CBC    Component Value Date/Time   WBC 5.6 07/15/2017 1500   WBC 6.7 12/01/2013 0900   RBC 4.33 07/15/2017 1500   RBC 4.70 12/01/2013 0900   HGB 13.3 07/15/2017 1500   HCT 40.3 07/15/2017 1500   PLT 216 07/15/2017 1500   MCV 93 07/15/2017 1500   MCH 30.7 07/15/2017 1500   MCH 29.4 12/01/2013 0900   MCHC 33.0 07/15/2017 1500   MCHC 31.8 12/01/2013 0900   RDW 13.6 07/15/2017 1500   LYMPHSABS 1.1 12/01/2013 0900   LYMPHSABS 0.5 (L) 07/29/2012 1606   MONOABS 0.5 12/01/2013 0900   EOSABS 0.1 12/01/2013 0900   EOSABS 0.0 07/29/2012 1606   BASOSABS 0.0 12/01/2013 0900   BASOSABS 0.0 07/29/2012 1606    Chest Imaging: None   Pulmonary Functions Testing Results: No flowsheet data found.  FeNO: None   Pathology: None   Echocardiogram: None   Heart Catheterization: None     Assessment & Plan:   Cough - Plan: SLP modified barium swallow, DG ESOPHAGUS INC SCOUT CHEST & DELAYED IMG SINGLE CM (BA OR SOL)  Dysphagia, unspecified type  Gastroesophageal reflux disease, esophagitis presence not specified  Discussion:  This is an 84 year old female with a history of chronic cough for the past several months.  She does admit to a history of cough that is predominantly throughout the day and is associated with eating and drinking.  She does have sputum production on occasion.  She will occasionally get choked on liquids as well as swallowing pills.  She does have  a history of gastroesophageal reflux.  Not currently taking anything for this.  Of note she has MCI and sees a neurologist.  Does have family history of father with Alzheimer's.  She is also currently on Aricept.  I am concerned that her chronic cough could be related to recurrent aspiration or penetration of liquids from the oropharyngeal space to the airway.  I think we will start to  evaluate her chronic cough in a stepwise fashion to include the following:  We will obtain an M BSS as well as a barium esophagram. She can also start use of H2 blocker at night for reflux. She was concerned about initiating PPI and link with concern of memory loss.  Therefore we did not start PPI.  Patient to follow-up with [place] in 3 to 4 weeks to review findings of esophagram and barium swallow. I do believe the next step would be consideration for pulmonary function test, and chest imaging.  I do not feel that she has any warning symptoms to suggest need for axial chest imaging at this time.  Greater than 50% of this patient's 45-minute office visit was spent face-to-face discussing the above recommendations and treatment plan.     Current Outpatient Medications:  .  aspirin EC 81 MG tablet, Take 81 mg by mouth at bedtime. , Disp: , Rfl:  .  Calcium Carbonate-Vitamin D (CALCIUM PLUS VITAMIN D PO), Take 1 tablet by mouth 2 (two) times daily. , Disp: , Rfl:  .  Coenzyme Q10 (CO Q 10) 100 MG CAPS, Take 1 tablet by mouth at bedtime. , Disp: , Rfl:  .  dorzolamide (TRUSOPT) 2 % ophthalmic solution, , Disp: , Rfl:  .  EVENING PRIMROSE OIL PO, Take 1,300 mg by mouth at bedtime., Disp: , Rfl:  .  Ibuprofen (ADVIL) 200 MG CAPS, Take 400 mg by mouth daily as needed (pain.)., Disp: , Rfl:  .  Omega-3 Fatty Acids (FISH OIL) 1200 MG CAPS, Take 1 capsule by mouth 2 (two) times daily. , Disp: , Rfl:  .  Probiotic Product (PROBIOTIC PO), Take 1 capsule by mouth at bedtime. Ultra Flora Balance., Disp: , Rfl:  .  rivastigmine (EXELON) 1.5 MG capsule, Take 1 capsule (1.5 mg) by mouth every morning and 2 capsules (3 mg) by mouth daily at bedtime. (Patient taking differently: Take 1.5 mg by mouth every morning. ), Disp: 90 capsule, Rfl: 5 .  rivastigmine (EXELON) 3 MG capsule, Take 3 mg by mouth at bedtime., Disp: , Rfl:  .  Rotigotine (NEUPRO) 1 MG/24HR PT24, Place 1 patch (1 mg total) onto the skin at  bedtime., Disp: 30 patch, Rfl: Garrett, DO [facility] 02/27/2018 11:18 AM

## 2018-02-28 ENCOUNTER — Other Ambulatory Visit: Payer: Medicare Other

## 2018-03-04 ENCOUNTER — Ambulatory Visit
Admission: RE | Admit: 2018-03-04 | Discharge: 2018-03-04 | Disposition: A | Discharge status: Home / Self Care | Payer: Medicare Other | Source: Ambulatory Visit | Attending: Radiology | Admitting: Radiology

## 2018-03-04 DIAGNOSIS — N6320 Unspecified lump in the left breast, unspecified quadrant: Secondary | ICD-10-CM

## 2018-03-07 ENCOUNTER — Encounter: Payer: Self-pay | Admitting: Internal Medicine

## 2018-03-11 ENCOUNTER — Other Ambulatory Visit: Payer: Medicare Other

## 2018-03-11 ENCOUNTER — Telehealth: Payer: Self-pay | Admitting: Pulmonary Disease

## 2018-03-11 NOTE — Telephone Encounter (Signed)
Called and spoke with Baxter Flattery in [place] at phone [phone] She asked with Marijo File is requesting both barium swallow study and a esophagram. Ali Lowe that [facility] requests pt have both studies completed, orders were placed 02/27/2018 On the Barium swallow study was scheduled, Baxter Flattery advised she will have the esophagram scheduled sameday Both tests are now scheduled for the patient to completed on 03/14/2018. Baxter Flattery verbalized understanding, had no further concerns. Nothing further needed at this time.

## 2018-03-14 ENCOUNTER — Ambulatory Visit (HOSPITAL_COMMUNITY)
Admission: RE | Admit: 2018-03-14 | Discharge: 2018-03-14 | Disposition: A | Discharge status: Home / Self Care | Payer: Medicare Other | Source: Ambulatory Visit | Attending: Pulmonary Disease | Admitting: Pulmonary Disease

## 2018-03-14 DIAGNOSIS — R05 Cough: Secondary | ICD-10-CM | POA: Insufficient documentation

## 2018-03-14 DIAGNOSIS — R059 Cough, unspecified: Secondary | ICD-10-CM

## 2018-03-14 DIAGNOSIS — R1310 Dysphagia, unspecified: Secondary | ICD-10-CM | POA: Diagnosis present

## 2018-03-14 NOTE — Progress Notes (Signed)
Modified Barium Swallow Progress Note  Patient Details  Name: Sintia Mckissic MRN: [id number] Date of Birth: 29-Dec-1934  Today's Date: 03/14/2018  Modified Barium Swallow completed.  Full report located under Chart Review in the Imaging Section.  Brief recommendations include the following:  Clinical Impression  Pt was seen in radiology suite for modified barium swallow study. She was seated upright in swallow chair for the study which was conducted in lateral view. Trials of puree solids, regular texture solids, mixed consistency boluses (i.e., peaches with thin liquids), thin liquids via straw, and a 73mm barium tablet were administered. She demonstrated mild difficulty with A-P transport with the barium tablet but stated that flat tablets are always difficult for her. Overall, her oropahryngeal swallow mechanism was within functional limits. No significant findings were noted during esophageal screening but further assessment of the esophageal phase of swallowing will be conducted during the esophagram which is scheduled for today (03/14/18). Further skilled SLP services are not clinically indicated at this time.   Swallow Evaluation Recommendations       SLP Diet Recommendations: Regular solids;Thin liquid   Liquid Administration via: Cup;Straw   Medication Administration: Whole meds with liquid          Postural Changes: Remain semi-upright after after feeds/meals (Comment);Seated upright at 90 degrees   Oral Care Recommendations: Oral care BID      Hanson Medeiros I. Hardin Negus, [place], [place] Office number [phone] Pager [phone]  Horton Marshall 03/14/2018,12:00 PM

## 2018-03-18 ENCOUNTER — Inpatient Hospital Stay: Admission: RE | Admit: 2018-03-18 | Payer: Medicare Other | Source: Ambulatory Visit

## 2018-03-19 ENCOUNTER — Non-Acute Institutional Stay: Payer: Medicare Other | Admitting: Internal Medicine

## 2018-03-19 ENCOUNTER — Encounter: Payer: Self-pay | Admitting: Internal Medicine

## 2018-03-19 VITALS — BP 112/60 | HR 95 | Temp 98.1°F | Ht 64.0 in | Wt 115.0 lb

## 2018-03-19 DIAGNOSIS — G2581 Restless legs syndrome: Secondary | ICD-10-CM

## 2018-03-19 DIAGNOSIS — R634 Abnormal weight loss: Secondary | ICD-10-CM

## 2018-03-19 DIAGNOSIS — Z8739 Personal history of other diseases of the musculoskeletal system and connective tissue: Secondary | ICD-10-CM

## 2018-03-19 DIAGNOSIS — I4719 Other supraventricular tachycardia: Secondary | ICD-10-CM

## 2018-03-19 DIAGNOSIS — E042 Nontoxic multinodular goiter: Secondary | ICD-10-CM

## 2018-03-19 DIAGNOSIS — R413 Other amnesia: Secondary | ICD-10-CM | POA: Diagnosis not present

## 2018-03-19 DIAGNOSIS — N3946 Mixed incontinence: Secondary | ICD-10-CM

## 2018-03-19 DIAGNOSIS — R110 Nausea: Secondary | ICD-10-CM | POA: Diagnosis not present

## 2018-03-19 DIAGNOSIS — M8580 Other specified disorders of bone density and structure, unspecified site: Secondary | ICD-10-CM

## 2018-03-19 DIAGNOSIS — I471 Supraventricular tachycardia: Secondary | ICD-10-CM

## 2018-03-19 DIAGNOSIS — H401422 Capsular glaucoma with pseudoexfoliation of lens, left eye, moderate stage: Secondary | ICD-10-CM

## 2018-03-19 NOTE — Progress Notes (Signed)
Provider:  Rexene Edison. Mariea Clonts, D.O., C.M.D. Location:  Artist of Service:  Clinic (12)  Previous PCP: Josetta Huddle, MD Patient Care Team: Josetta Huddle, MD as PCP - General (Internal Medicine)  Extended Emergency Contact Information Primary Emergency Contact: Faith,George A Address: [address] [place], [place] [address] [place] Phone: [phone] Mobile Phone: [phone] Relation: Spouse Secondary Emergency Contact: Annice Pih, [place] [address] [place] of [employer] Phone: [phone] Relation: Daughter  Goals of Care: Advanced Directive information Advanced Directives 03/19/2018  Does Patient Have a Medical Advance Directive? Yes  Type of Paramedic of [place];Living will  Does patient want to make changes to medical advance directive? No - Patient declined  Copy of [place] in Chart? Yes - validated most recent copy scanned in chart (See row information)  Would patient like information on creating a medical advance directive? -  Pre-existing out of facility DNR order (yellow form or pink MOST form) -      Chief Complaint  Patient presents with  . 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  Records have been requested and received from Dr. Josetta Huddle.  I have reviewed them.    She is doing fine.  One concern she has had--She stays nauseous each morning.  She is also still losing weight--she is 115 lbs now.  She is eating seemingly well.  Had been 122 lbs in June of 2019.  reports losing at first when [name], her husband, was ill and hospitalized in Nov.  Had mild esophageal dysmotility.  She admits to worrying about her husband's health due to a recent cancer diagnosis.  We discussed that some supplements cause nausea.   She's on  exelon instead of aricept due to nausea, but continues with the symptoms.  She has not had the patches.   Occasionally takes ibuprofen pm.  Not used lately--knee has not bothered her.    Has neupro patch for restless legs.  It's helping quite a bit.    She is back on boniva--reviewed Dr. Inda Merlin' notes and they indicated she had stopped it herself before.  Due to take next on 2/13.  Takes ca with D and additional D.    Glaucoma:  Dr. Andria Frames monitors her pressure--thru [place] have been controlled.  Eyes get tired.    Memory loss: Sees Dr. Jaynee Eagles next on May 4th.  She thinks her memory has improved somewhat with faithrul use of her exelon. Has to work hard to remember names and words.  She's got hearing aids this week which [name] notices has made a difference.  Had struggled at a group table before this.    Wears a pad all of the time in case.  Has had accidents with urine.  Some urge and some stress.    Took steroids for 2 years for temporal arteritis.  Thinned her skin and she still bruises very easily on her legs.  She had an oophorectomy.  Had an ovarian cyst before.    No difficulty with her heart racing recently.  Had monitors and testing in [place] several years ago.    Past Medical History:  Diagnosis Date  . Arthritis    OA BOTH KNEES AND  HANDS  . Atrial tachycardia (HCC)    HX OF  . GERD (gastroesophageal reflux disease)   . Glaucoma    left eye  . Goiter    CAUSING COUGH, HOARSINESS AND DRY THROAT  . Headache(784.0)    MIGRAINES   . Overactive bladder   . Temporal arteritis [facility])    Past Surgical History:  Procedure Laterality Date  . ARTERY BIOPSY Right 07/21/2012   Procedure: BIOPSY TEMPORAL ARTERY;  Surgeon: Haywood Lasso, MD;  Location: [place];  Service: General;  Laterality: Right;  . CERVICAL FUSION  2012   Dr. Vertell Limber  . EYE SURGERY     CATARACT EXTRACTION- BILATERAL  . INCONTINENCE SURGERY    . JOINT REPLACEMENT     right knee  . LEFT  SHOULDER SURGERY    . RIGHT KNEE ARTHROSCOPY  11/2009    . ROBOTIC ASSISTED BILATERAL SALPINGO OOPHERECTOMY Bilateral 12/08/2013   Procedure: ROBOTIC ASSISTED LAPAROSCOPIC BILATERAL SALPINGO OOPHORECTOMY WITH STAGING;  Surgeon: Everitt Amber, MD;  Location: [facility];  Service: Gynecology;  Laterality: Bilateral;  . TOTAL KNEE ARTHROPLASTY  07/04/2011   Procedure: TOTAL KNEE ARTHROPLASTY;  Surgeon: Gearlean Alf, MD;  Location: [facility];  Service: Orthopedics;  Laterality: Right;    Social History   Socioeconomic History  . Marital status: Married    Spouse name: [name]  . Number of children: 2  . Years of education: college  . Highest education level: Not on file  Occupational History  . Occupation: retired    Fish farm manager: RETIRED  Social Needs  . Financial resource strain: Not on file  . Food insecurity:    Worry: Not on file    Inability: Not on file  . Transportation needs:    Medical: Not on file    Non-medical: Not on file  Tobacco Use  . Smoking status: Never Smoker  . Smokeless tobacco: Never Used  Substance and Sexual Activity  . Alcohol use: Yes    Alcohol/week: 1.0 standard drinks    Types: 1 Glasses of wine per week    Comment: SELDOM, very little  . Drug use: No  . Sexual activity: Yes  Lifestyle  . Physical activity:    Days per week: Not on file    Minutes per session: Not on file  . Stress: Not on file  Relationships  . Social connections:    Talks on phone: Not on file    Gets together: Not on file    Attends religious service: Not on file    Active member of club or organization: Not on file    Attends meetings of clubs or organizations: Not on file    Relationship status: Not on file  Other Topics Concern  . Not on file  Social History Narrative   Pt lives at home with family.   Caffeine Use: Very little.    Patient is right handed       Social History      Diet? No       Do you drink/eat things with caffeine? Some coffee      Marital status?     Married                                What year were you married? 1961      Do you live in a house, apartment, assisted living, condo, trailer, etc.?  [employer]  Is it one or more stories? one      How many persons live in your home? two      Do you have any pets in your home? No       Highest level of education completed? 14 years       Current or past profession:  Chemical engineer       Do you exercise?              Yes                        Type & how often? Water aerobic and balance class      Advanced Directives      Do you have a living will?  yes      Do you have a DNR form?        yes                          If not, do you want to discuss one?      Do you have signed POA/HPOA for forms? yes      Functional Status      Do you have difficulty bathing or dressing yourself?      Do you have difficulty preparing food or eating?       Do you have difficulty managing your medications?      Do you have difficulty managing your finances?      Do you have difficulty affording your medications?    reports that she has never smoked. She has never used smokeless tobacco. She reports current alcohol use of about 1.0 standard drinks of alcohol per week. She reports that she does not use drugs.  Functional Status Survey:    Family History  Problem Relation Age of Onset  . Alzheimer's disease Mother   . Heart attack Father     Health Maintenance  Topic Date Due  . TETANUS/TDAP  12/12/2027  . INFLUENZA VACCINE  Completed  . DEXA SCAN  Completed  . PNA vac Low Risk Adult  Completed    Allergies  Allergen Reactions  . Oxycodone Anaphylaxis  . Mold Extract [Trichophyton]     headache migraine  . Codeine Nausea Only    Outpatient Encounter Medications as of 03/19/2018  Medication Sig  . aspirin EC 81 MG tablet Take 81 mg by mouth at bedtime.   . Biotin 5000 MCG CAPS Take 1 capsule by mouth daily.  . Calcium Carbonate-Vitamin D (CALCIUM PLUS VITAMIN D PO) Take 1  tablet by mouth 2 (two) times daily.   . cholecalciferol (VITAMIN D3) 25 MCG (1000 UT) tablet Take 1,000 Units by mouth daily.  . Coenzyme Q10 (CO Q 10) 100 MG CAPS Take 1 tablet by mouth at bedtime.   . dorzolamide (TRUSOPT) 2 % ophthalmic solution   . EVENING PRIMROSE OIL PO Take 1,300 mg by mouth at bedtime.  . Ibuprofen (ADVIL) 200 MG CAPS Take 400 mg by mouth daily as needed (pain.).  . Magnesium 200 MG TABS Take 1 tablet by mouth 2 (two) times daily.  . Omega-3 Fatty Acids (FISH OIL) 1200 MG CAPS Take 1 capsule by mouth daily.   . Probiotic Product (PROBIOTIC PO) Take 1 capsule by mouth at bedtime. Ultra Flora Balance.  . ranitidine (ZANTAC) 150 MG tablet Take 1 tablet (150 mg total) by mouth 2 (two) times daily.  . rivastigmine (EXELON) 1.5  MG capsule Take 1.5 mg by mouth 2 (two) times daily. Takes in am and 3:00 pm  . rivastigmine (EXELON) 3 MG capsule Take 3 mg by mouth at bedtime.  . Rotigotine (NEUPRO) 1 MG/24HR PT24 Place 1 patch (1 mg total) onto the skin at bedtime.   No facility-administered encounter medications on file as of 03/19/2018.     Review of Systems  Constitutional: Positive for weight loss. Negative for chills, fever and malaise/fatigue.  HENT: Positive for hearing loss. Negative for congestion, ear discharge and ear pain.        New hearing aids  Eyes: Negative for blurred vision.       Wears glasses, has glaucoma  Respiratory: Negative for cough and shortness of breath.   Cardiovascular: Negative for chest pain, palpitations and leg swelling.  Gastrointestinal: Positive for nausea. Negative for abdominal pain, blood in stool, constipation, diarrhea, heartburn, melena and vomiting.  Genitourinary: Positive for urgency. Negative for dysuria, flank pain, frequency and hematuria.       Some urge and stress incontinence, wears pad all of the time  Musculoskeletal: Negative for back pain, falls, joint pain and neck pain.  Skin: Negative for itching and rash.    Neurological: Negative for dizziness, sensory change and loss of consciousness.       Restless legs improved  Endo/Heme/Allergies: Bruises/bleeds easily.  Psychiatric/Behavioral: Positive for memory loss. Negative for depression. The patient is not nervous/anxious and does not have insomnia.        Worrier of the household     Vitals:   03/19/18 1023  BP: 112/60  Pulse: 95  Temp: 98.1 F (36.7 C)  TempSrc: Oral  SpO2: 97%  Weight: 115 lb (52.2 kg)  Height: 5\' 4"  (1.626 m)   Body mass index is 19.74 kg/m. Physical Exam Vitals signs reviewed.  Constitutional:      General: She is not in acute distress.    Appearance: Normal appearance. She is not toxic-appearing.     Comments: Thin female  HENT:     Head: Normocephalic and atraumatic.     Right Ear: Tympanic membrane, ear canal and external ear normal.     Left Ear: Tympanic membrane, ear canal and external ear normal.     Ears:     Comments: A tiny bit of cerumen present, hearing aids    Nose: Nose normal.     Mouth/Throat:     Mouth: Mucous membranes are moist.     Pharynx: Oropharynx is clear.  Eyes:     Extraocular Movements: Extraocular movements intact.     Conjunctiva/sclera: Conjunctivae normal.     Pupils: Pupils are equal, round, and reactive to light.     Comments: glasses  Neck:     Musculoskeletal: Normal range of motion and neck supple. No muscular tenderness.     Comments: Multinodular goiter Cardiovascular:     Rate and Rhythm: Normal rate and regular rhythm.     Pulses: Normal pulses.     Heart sounds: Normal heart sounds.  Pulmonary:     Effort: Pulmonary effort is normal. No respiratory distress.     Breath sounds: Normal breath sounds. No wheezing, rhonchi or rales.  Abdominal:     General: Abdomen is flat. Bowel sounds are normal. There is no distension.     Palpations: There is no mass.     Tenderness: There is no abdominal tenderness. There is no guarding or rebound.     Hernia: No hernia  is present.  Musculoskeletal: Normal range of motion.        General: No swelling or tenderness.     Right lower leg: No edema.     Left lower leg: No edema.  Lymphadenopathy:     Cervical: No cervical adenopathy.  Skin:    General: Skin is warm and dry.     Capillary Refill: Capillary refill takes less than 2 seconds.     Findings: Bruising present.     Comments: Some small dark brown to purple bruises on shins, thin skin  Neurological:     General: No focal deficit present.     Mental Status: She is alert and oriented to person, place, and time. Mental status is at baseline.     Cranial Nerves: No cranial nerve deficit.     Sensory: No sensory deficit.     Motor: No weakness.     Coordination: Coordination normal.     Gait: Gait normal.     Deep Tendon Reflexes: Reflexes normal.  Psychiatric:        Mood and Affect: Mood normal.        Behavior: Behavior normal.        Thought Content: Thought content normal.        Judgment: Judgment normal.     Labs reviewed: Basic Metabolic Panel: Recent Labs    07/15/17 1500 12/11/17 1024  NA 144 142  K 4.0 4.1  CL 105  --   CO2 20  --   GLUCOSE 112*  --   BUN 24 17  CREATININE 0.92 0.8  CALCIUM 9.2  --    Liver Function Tests: Recent Labs    07/15/17 1500 12/11/17 1024  AST 17 20  ALT 13 17  ALKPHOS 66 57  BILITOT 0.6  --   PROT 6.2  --   ALBUMIN 4.1  --    No results for input(s): LIPASE, AMYLASE in the last 8760 hours. No results for input(s): AMMONIA in the last 8760 hours. CBC: Recent Labs    07/15/17 1500 12/11/17 1024  WBC 5.6 5.0  HGB 13.3 14.1  HCT 40.3 42  MCV 93  --   PLT 216 172   Cardiac Enzymes: No results for input(s): CKTOTAL, CKMB, CKMBINDEX, TROPONINI in the last 8760 hours. BNP: Invalid input(s): POCBNP Lab Results  Component Value Date   HGBA1C 5.4 05/21/2014   Lab Results  Component Value Date   TSH 0.46 12/11/2017   Lab Results  Component Value Date   TZGYFVCB44 967  12/11/2017   Lab Results  Component Value Date   FOLATE >20.0 08/02/2015   Lab Results  Component Value Date   IRON 57 09/16/2017   TIBC 239 (L) 09/16/2017   FERRITIN 82 09/16/2017    Imaging and Procedures noted on new patient packet: Bone density reviewed--osteopenia bordering on osteoporosis Thyroid US reviewed--6/19--no biopsies required for nodules this time--recheck in 6/20  Assessment/Plan 1. Abnormal weight loss -seems to correlate with anxiety about her husband's health; however, she does remains nauseous in the mornings also  2. Nausea -will copy Dr. Jaynee Eagles on this note to see if she thinks the oral exelon could be causing this--?patch might be better for her  3. Memory loss, short term - MMSE - Mini Mental State Exam 08/02/2015  Orientation to time 5  Orientation to Place 5  Registration 3  Attention/ Calculation 5  Recall 3  Language- name 2 objects 2  Language- repeat 1  Language- follow 3 step command  3  Language- read & follow direction 1  Write a sentence 1  Copy design 1  Total score 30  -seems consistent with mild cognitive impairment at this point--she's writing things down more to keep herself on task, but does continue to score well on testing -her husband is keeping close track of her also  4. Ectopic atrial tachycardia (HCC) -no recent difficulties since her palpitations were worked up and reached this diagnosis  5. RLS (restless legs syndrome) -doing well with neupro patch thru neurology  6. Capsular glaucoma with pseudoexfoliation of lens, left eye, moderate stage -currently stable with drops thru ophtho, cont to monitor  7. H/O temporal arteritis -no longer on therapy, but important to note since PMR can develop concurrently or at other times  8. Senile osteopenia -cont boniva and ca with D with additional D3, weightbearing exercise  9. Mixed urge and stress incontinence -cont pad use, unfortunately, most meds counteract memory med  txs, but myrbetriq could be considered if this becomes more serious  10. Multinodular goiter (nontoxic) -last imaged in June last year -f/u US of thyroid in June 2020 (will order at next visit)  Labs/tests ordered:  No new today F/u 06/18/2018 in [place]. Miyonna Ormiston, D.O. [facility] [address], [place] [address] Cell Phone (Mon-Fri 8am-5pm):  [phone] On Call:  [phone] & follow prompts after 5pm & weekends Office Phone:  [phone] Office Fax:  [phone]

## 2018-03-20 DIAGNOSIS — R110 Nausea: Secondary | ICD-10-CM | POA: Insufficient documentation

## 2018-03-20 DIAGNOSIS — Z8739 Personal history of other diseases of the musculoskeletal system and connective tissue: Secondary | ICD-10-CM | POA: Insufficient documentation

## 2018-03-20 DIAGNOSIS — M8580 Other specified disorders of bone density and structure, unspecified site: Secondary | ICD-10-CM | POA: Insufficient documentation

## 2018-03-20 DIAGNOSIS — R413 Other amnesia: Secondary | ICD-10-CM | POA: Insufficient documentation

## 2018-03-20 DIAGNOSIS — R634 Abnormal weight loss: Secondary | ICD-10-CM | POA: Insufficient documentation

## 2018-03-20 DIAGNOSIS — E042 Nontoxic multinodular goiter: Secondary | ICD-10-CM | POA: Insufficient documentation

## 2018-03-20 DIAGNOSIS — N3946 Mixed incontinence: Secondary | ICD-10-CM | POA: Insufficient documentation

## 2018-03-28 ENCOUNTER — Other Ambulatory Visit (INDEPENDENT_AMBULATORY_CARE_PROVIDER_SITE_OTHER): Payer: Self-pay

## 2018-03-28 ENCOUNTER — Other Ambulatory Visit: Payer: Self-pay | Admitting: Neurology

## 2018-03-28 ENCOUNTER — Telehealth: Payer: Self-pay | Admitting: *Deleted

## 2018-03-28 DIAGNOSIS — M316 Other giant cell arteritis: Secondary | ICD-10-CM

## 2018-03-28 DIAGNOSIS — Z0289 Encounter for other administrative examinations: Secondary | ICD-10-CM

## 2018-03-28 NOTE — Telephone Encounter (Signed)
Spoke with daughter and she would like to leave everything " as is" pt was on gabapentin and stopped that and it seemed to help the nausea.

## 2018-03-28 NOTE — Telephone Encounter (Signed)
-----   Message from Gayland Curry, DO sent at 03/24/2018  6:00 PM EST ----- Regarding: nausea with exelon Can you please call Betty's daughter and ask her how they feel about stopping the exelon for her memory to see if it is causing the nausea?  That was Dr. Cathren Laine suggestion when we discussed it.  If she has a decline, we could consider the patch.  She also mentioned that the restless leg patch can also cause some nausea (I was not familiar with it).

## 2018-03-28 NOTE — Progress Notes (Signed)
Monitoring esr and crp

## 2018-03-29 LAB — SEDIMENTATION RATE: Sed Rate: 2 mm/hr (ref 0–40)

## 2018-03-29 LAB — C-REACTIVE PROTEIN: CRP: 1 mg/L (ref 0–10)

## 2018-03-30 NOTE — Telephone Encounter (Signed)
Ok,noted

## 2018-03-31 ENCOUNTER — Telehealth: Payer: Self-pay | Admitting: *Deleted

## 2018-03-31 NOTE — Telephone Encounter (Signed)
Called pt. She was unavailable and will be back in about 1 hour. Will try to contact again later.

## 2018-03-31 NOTE — Telephone Encounter (Signed)
I called the patient and discussed her lab results were great, better than before. I advised that Dr. Jaynee Eagles said she doesn't think we need to continue monitoring them. Patient verbalized understanding and appreciation.

## 2018-03-31 NOTE — Telephone Encounter (Signed)
-----   Message from Melvenia Beam, MD sent at 03/29/2018  8:32 AM EST ----- Excellent labs, the best we have seen so far. I don;t think we need to continue monitoring. thanks

## 2018-06-16 ENCOUNTER — Ambulatory Visit: Payer: Medicare Other | Admitting: Neurology

## 2018-06-18 ENCOUNTER — Other Ambulatory Visit: Payer: Self-pay

## 2018-06-18 ENCOUNTER — Encounter: Payer: Medicare Other | Admitting: Internal Medicine

## 2018-06-23 ENCOUNTER — Other Ambulatory Visit: Payer: Self-pay | Admitting: Neurology

## 2018-07-08 ENCOUNTER — Other Ambulatory Visit: Payer: Self-pay | Admitting: Neurology

## 2018-08-01 ENCOUNTER — Other Ambulatory Visit: Payer: Self-pay | Admitting: Neurology

## 2018-09-08 ENCOUNTER — Other Ambulatory Visit: Payer: Self-pay | Admitting: Neurology

## 2018-09-11 NOTE — Telephone Encounter (Signed)
Pt is calling in to check status of refill

## 2018-09-12 ENCOUNTER — Telehealth: Payer: Self-pay | Admitting: Neurology

## 2018-09-12 NOTE — Telephone Encounter (Signed)
Pt is asking for a call to discuss why she is unable to get the rivastigmine (EXELON) 3 MG capsule filled, pt states she is about to run out

## 2018-09-14 ENCOUNTER — Other Ambulatory Visit: Payer: Self-pay | Admitting: Neurology

## 2018-09-14 MED ORDER — RIVASTIGMINE TARTRATE 3 MG PO CAPS: 3.0000 mg | ORAL_CAPSULE | Freq: Every day | ORAL | 4 refills | Status: DC

## 2018-09-14 MED ORDER — RIVASTIGMINE TARTRATE 1.5 MG PO CAPS: ORAL_CAPSULE | ORAL | 4 refills | Status: DC

## 2018-09-14 NOTE — Telephone Encounter (Signed)
I spoke to patient's husband, medication refilled

## 2018-11-10 ENCOUNTER — Telehealth: Payer: Self-pay | Admitting: Neurology

## 2018-11-10 ENCOUNTER — Other Ambulatory Visit: Payer: Self-pay | Admitting: Neurology

## 2018-11-10 DIAGNOSIS — M316 Other giant cell arteritis: Secondary | ICD-10-CM

## 2018-11-10 NOTE — Telephone Encounter (Signed)
Patient having headaches. Please ask her to come in to get labs prior to appointment. She needs to be seen this week so if I dont have anything see if Amy oe Megan do. Tell patient I will converse with them so don't worry, but I am just full and pending other patients to get in as well. Needs esr and crp, I will place order

## 2018-11-10 NOTE — Telephone Encounter (Signed)
Pt has called and scheduled an in office visit and she is on wait list.  Pt would like a call from RN to see if Dr Jaynee Eagles would suggest anything while waiting to be seen re: her sharp pain in head and neck off and on for a month

## 2018-11-10 NOTE — Telephone Encounter (Signed)
PT will still see DR Jaynee Eagles on 11/17/2018 for regular appt.

## 2018-11-10 NOTE — Telephone Encounter (Signed)
Duplicate note:  I called pt about seeing Amy NP for a earlier appt for her sharp pain in neck and head. I offer her an appt this week but pt decline wanting to wait to see Dr Jaynee Eagles on Monday. Dr.Ahern spoke with pt and recommend she see Amy NP for a nerve block this week and get labs when she comes. PT schedule with Amy NP for nerve block on 11/12/2018 at 0130pm check in by 115pm.

## 2018-11-10 NOTE — Telephone Encounter (Signed)
I called pt about seeing Amy NP for a earlier appt for her sharp pain in neck and head. I offer her an appt this week but pt decline wanting to wait to see Dr Jaynee Eagles on Monday. Dr.Ahern spoke with pt and recommend she see Amy NP for a nerve block this week and get labs when she comes. PT schedule with Amy NP for nerve block on 11/12/2018 at 0130pm check in by 115pm.

## 2018-11-12 ENCOUNTER — Encounter: Payer: Self-pay | Admitting: Family Medicine

## 2018-11-12 ENCOUNTER — Encounter (INDEPENDENT_AMBULATORY_CARE_PROVIDER_SITE_OTHER): Payer: Self-pay | Admitting: Family Medicine

## 2018-11-12 ENCOUNTER — Other Ambulatory Visit: Payer: Self-pay

## 2018-11-12 DIAGNOSIS — M316 Other giant cell arteritis: Secondary | ICD-10-CM

## 2018-11-12 DIAGNOSIS — Z0289 Encounter for other administrative examinations: Secondary | ICD-10-CM

## 2018-11-12 NOTE — Progress Notes (Deleted)
     History: ***    Bupivicaine injection/Betamethasone protocol for occipital neuralgia  Bupivacaine 0.5% and Betamethasone 30 mg/5cc 1:1 mixture was injected on the scalp *** at several locations:  -On the occipital area of the head, 3 injections ***, 1 cc per injection at the midpoint between the mastoid process and the occipital protuberance. 2 other injections were done one finger breadth from the initial injection, one at a 10 o'clock position and the other at a 2 o'clock position.  The patient tolerated the injections well, no complications of the procedure were noted. Injections were made with a 27-gauge needle.  Bupivicaine NDC number is [phone].  Lot number is ***. Expriation date is ***.   Betamethasone NDC 0517-0720-01  Lot number is ***. Expiration date is ***.

## 2018-11-13 LAB — C-REACTIVE PROTEIN: CRP: 2 mg/L (ref 0–10)

## 2018-11-13 LAB — SEDIMENTATION RATE: Sed Rate: 3 mm/hr (ref 0–40)

## 2018-11-17 ENCOUNTER — Ambulatory Visit: Payer: Medicare Other | Admitting: Neurology

## 2018-12-25 ENCOUNTER — Other Ambulatory Visit: Payer: Self-pay | Admitting: Orthopedic Surgery

## 2018-12-25 DIAGNOSIS — M25562 Pain in left knee: Secondary | ICD-10-CM

## 2019-01-11 ENCOUNTER — Ambulatory Visit
Admission: RE | Admit: 2019-01-11 | Discharge: 2019-01-11 | Disposition: A | Discharge status: Home / Self Care | Payer: Medicare Other | Source: Ambulatory Visit | Attending: Orthopedic Surgery | Admitting: Orthopedic Surgery

## 2019-01-11 ENCOUNTER — Other Ambulatory Visit: Payer: Self-pay

## 2019-01-11 DIAGNOSIS — M25562 Pain in left knee: Secondary | ICD-10-CM

## 2019-01-21 DIAGNOSIS — S83289A Other tear of lateral meniscus, current injury, unspecified knee, initial encounter: Secondary | ICD-10-CM | POA: Insufficient documentation

## 2019-04-02 ENCOUNTER — Ambulatory Visit: Payer: Medicare Other | Admitting: Podiatry

## 2019-04-09 ENCOUNTER — Ambulatory Visit: Payer: Medicare Other | Admitting: Podiatry

## 2019-04-09 ENCOUNTER — Other Ambulatory Visit: Payer: Self-pay

## 2019-04-09 ENCOUNTER — Encounter: Payer: Self-pay | Admitting: Podiatry

## 2019-04-09 VITALS — BP 147/85 | HR 88 | Temp 97.9°F

## 2019-04-09 DIAGNOSIS — B351 Tinea unguium: Secondary | ICD-10-CM | POA: Diagnosis not present

## 2019-04-09 DIAGNOSIS — M7661 Achilles tendinitis, right leg: Secondary | ICD-10-CM | POA: Diagnosis not present

## 2019-04-09 MED ORDER — NEOMYCIN-POLYMYXIN-HC 3.5-10000-1 OT SOLN: OTIC | 0 refills | Status: DC

## 2019-04-10 NOTE — Progress Notes (Signed)
Subjective:   Patient ID: Janet Wilson, female   DOB: 85 y.o.   MRN: [id number]   HPI Patient states that she had a number of years ago a nail removed right big toe and then over the last 6 months there is been discoloration and thickness and she is concerned about it.  Patient states there is minimal discomfort and she has not noted drainage and does not smoke likes to be active   Review of Systems  All other systems reviewed and are negative.       Objective:  Physical Exam Vitals and nursing note reviewed.  Constitutional:      Appearance: She is well-developed.  Pulmonary:     Effort: Pulmonary effort is normal.  Musculoskeletal:        General: Normal range of motion.  Skin:    General: Skin is warm.  Neurological:     Mental Status: She is alert.     Neurovascular status intact muscle strength found to be adequate range of motion within normal limits with patient found to have a thickened right hallux nail mostly on the lateral side localized with probable trauma occurring also with the bed with no discomfort when palpated no drainage or erythema edema noted     Assessment:  Ability that this is a traumatized nail with thickness with probable mycotic component also noted     Plan:  H&P reviewed the condition and discussed fungal nail versus the possibility for traumatized nail with discoloration.  I explained all this to the patient and at this point I have recommended over-the-counter medication which was dispensed along with debridement technique and padding as needed.  Patient will be seen back as indicated and ultimately may require nail removal which I educated her on today

## 2019-06-20 IMAGING — [facility] DIGITAL DIAGNOSTIC UNILATERAL LEFT MAMMOGRAM WITH TOMO AND CAD
4 series · 4 of 12 positions shown · non-contrast
Comparison: Previous exam(s).

CLINICAL DATA: Patient presents with a reported 5 o'clock position
left breast mass. Patient herself describes pain from the anterior
inferior rib margin intermittently, typically after sitting. The
chest wall pain has been present for approximately 1 month.

EXAM:
DIGITAL DIAGNOSTIC LEFT MAMMOGRAM WITH CAD AND TOMO
ULTRASOUND LEFT BREAST

[L MLO synth-2D]
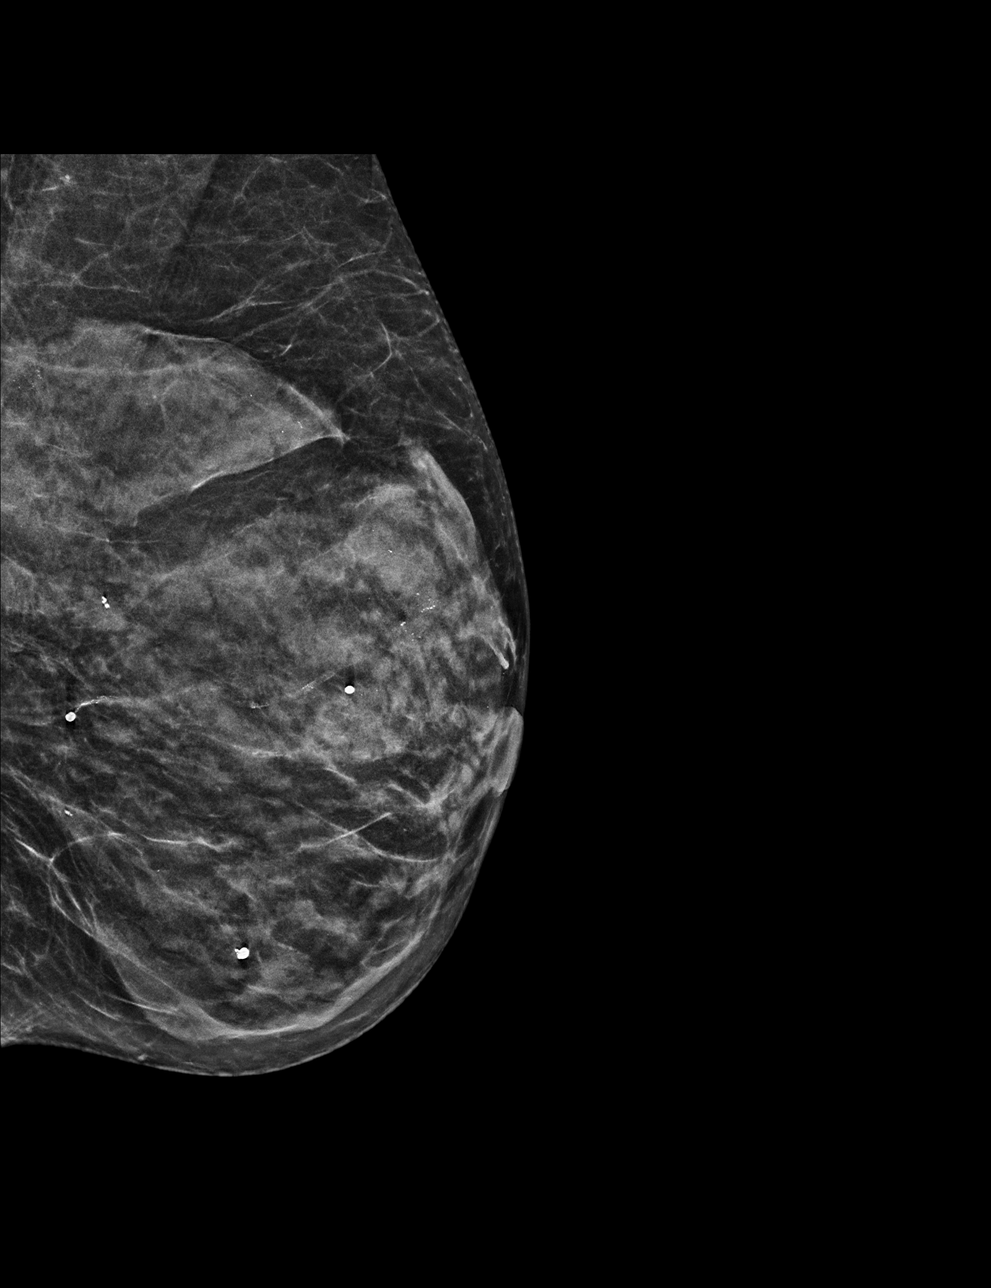

[L CC synth-2D]
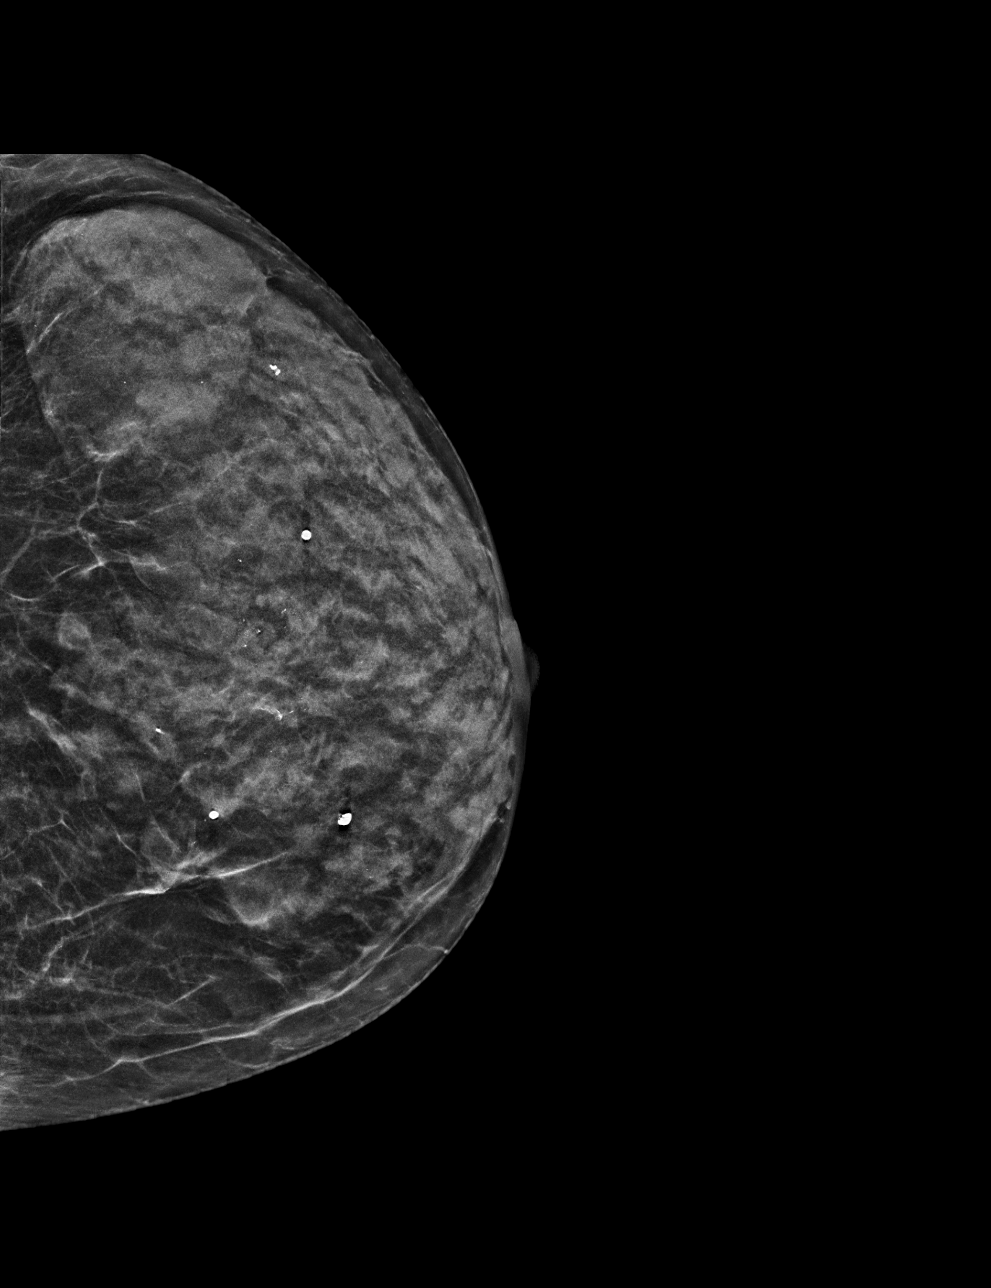

[L MLO tomo · tomo slice 23/46.0]
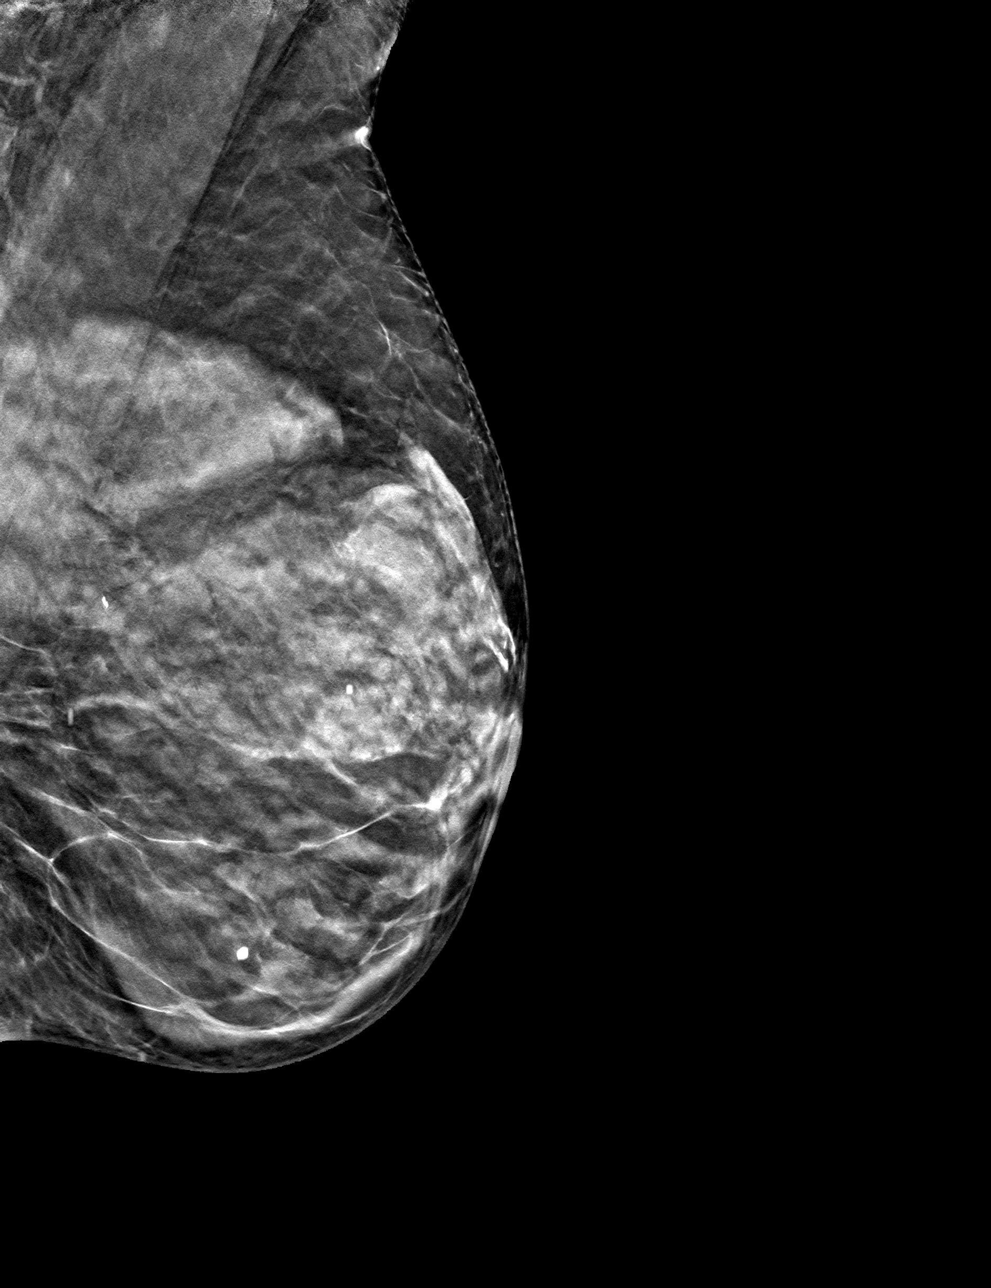

[L CC tomo · tomo slice 21/42.0]
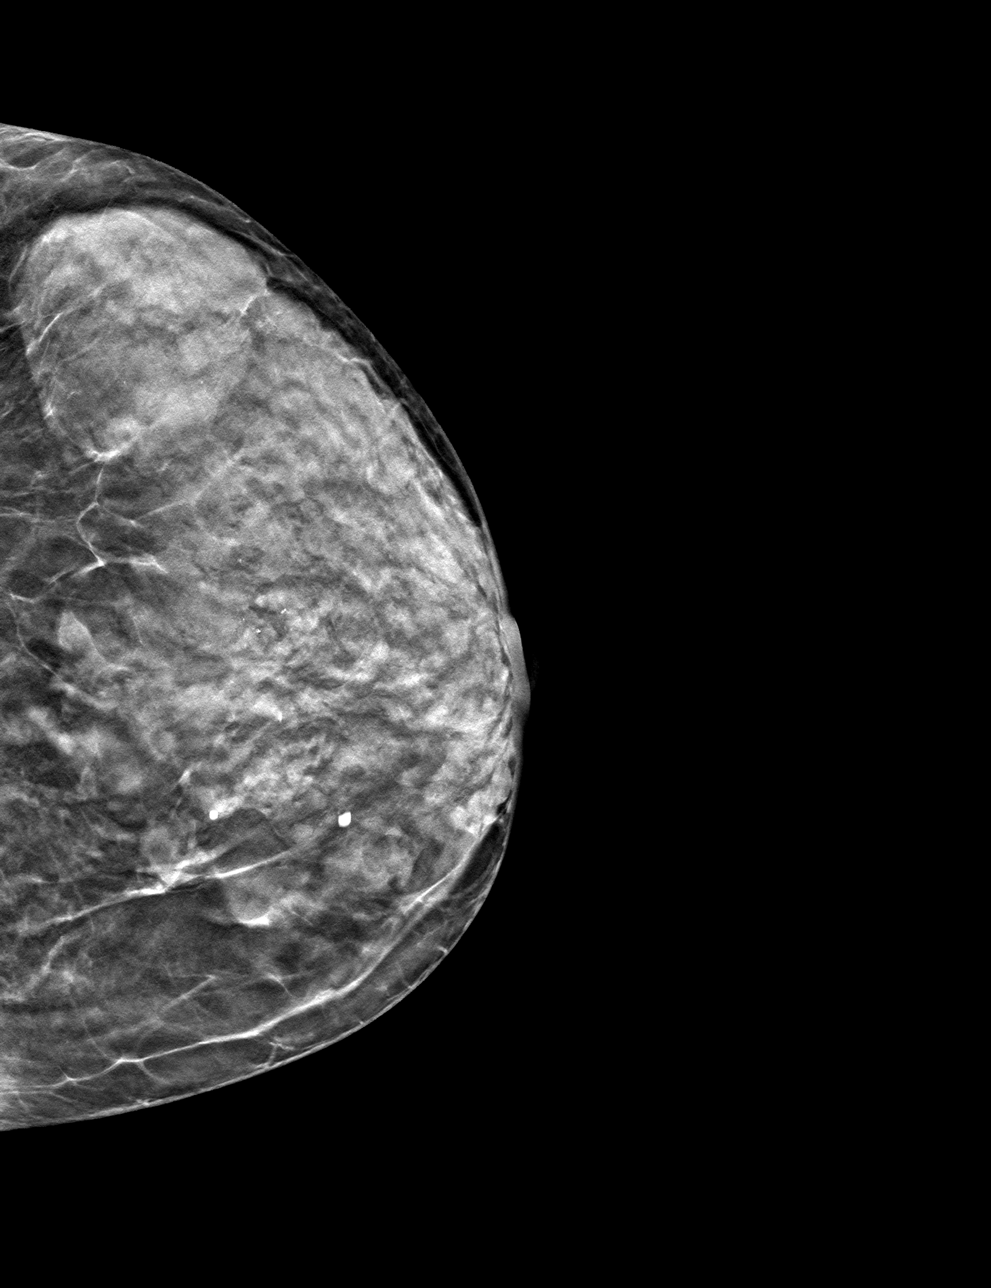

[4 of 12 positions shown; findings below may reference images not displayed]

ACR Breast Density Category d: The breast tissue is extremely dense,
which lowers the sensitivity of mammography.
FINDINGS: There are no discrete masses, areas of architectural distortion,
areas of significant asymmetry or new or suspicious calcifications.

Mammographic images were processed with CAD.

On physical exam, no mass is palpated in the left breast at 5
o'clock. No mass is palpated along the anterior inferior chest wall.

Targeted ultrasound is performed, showing heterogeneous
fibroglandular tissue in the 5 o'clock position of the left breast.

Assessment of the anterior inferior chest wall, along the costal
cartilage margin, demonstrates no masses or abnormalities.
IMPRESSION: Negative exam. No evidence of breast carcinoma. No abnormality noted
along the anterior, inferior left chest wall.

RECOMMENDATION:
1.  Screening mammogram in October 2018.(Code:E9-0-23D)
2. If further imaging evaluation of the chest wall pain is desired
clinically, this would be best performed with contrast enhanced
chest CT or MRI with and without contrast.

I have discussed the findings and recommendations with the patient.
Results were also provided in writing at the conclusion of the
visit. If applicable, a reminder letter will be sent to the patient
regarding the next appointment.

BI-RADS CATEGORY  1: Negative.

## 2019-09-28 ENCOUNTER — Other Ambulatory Visit: Payer: Self-pay | Admitting: Neurology

## 2019-09-28 NOTE — Telephone Encounter (Signed)
Pt last seen 12/2017. Will discuss with Dr Jaynee Eagles before any refills authorized.

## 2019-09-29 NOTE — Telephone Encounter (Signed)
Spoke with Dr Jaynee Eagles. Ok to refill but will call pt first to confirm her dosage. I spoke with pt. She stated she takes Exelon 1.5 mg in AM and 3 mg at bedtime. She scheduled a follow up with Janett Billow NP for next Thursday 8/26 @ 3:15 pm. Refills sent.   Dr. Jaynee Eagles was updated that pt is taking 1.5 mg in AM and 3 mg in PM and she is ok with refilling this.

## 2019-10-08 ENCOUNTER — Ambulatory Visit: Payer: Medicare Other | Admitting: Adult Health

## 2019-10-08 ENCOUNTER — Encounter: Payer: Self-pay | Admitting: Adult Health

## 2019-10-08 VITALS — BP 143/78 | HR 75 | Ht 63.0 in | Wt 112.0 lb

## 2019-10-08 DIAGNOSIS — G3184 Mild cognitive impairment, so stated: Secondary | ICD-10-CM | POA: Diagnosis not present

## 2019-10-08 DIAGNOSIS — G2581 Restless legs syndrome: Secondary | ICD-10-CM | POA: Diagnosis not present

## 2019-10-08 MED ORDER — RIVASTIGMINE TARTRATE 3 MG PO CAPS: 3.0000 mg | ORAL_CAPSULE | Freq: Every day | ORAL | 11 refills | Status: DC

## 2019-10-08 MED ORDER — NEUPRO 1 MG/24HR TD PT24: 1.0000 mg | MEDICATED_PATCH | Freq: Every day | TRANSDERMAL | 11 refills | Status: DC

## 2019-10-08 MED ORDER — RIVASTIGMINE TARTRATE 1.5 MG PO CAPS: ORAL_CAPSULE | ORAL | 11 refills | Status: DC

## 2019-10-08 NOTE — Progress Notes (Addendum)
[facility]    Provider:  Dr Jaynee Eagles Referring Provider: Josetta Huddle, MD Primary Care Physician:  Josetta Huddle, MD  CC: Mild cognitive impairment and RLS; needs medication refills   Today, 10/08/2019, Janet Wilson returns for follow-up regarding mild cognitive impairment and RLS She is unaccompanied at today's visit  Memory has been stable since prior visit She remains on exelon 1.5mg  AM and 3mg  PM tolerating well without side effects Denies behavioral concerns  RLS has been stable without worsening Intermittent use of neupro patches -she reports using sparingly due to high co-pay cost Attempted to initiate gabapentin at prior visit for RLS and cramping but she was unable to tolerate (unable to state reasoning)  No concerns at this time    History provided for reference purposes only Interval history 12/16/2017: She is improved. And formal neurocognitive testing did not show progression, in fact improvement. Here with daughter who provides information. The last testing was improved, normal as far as cognition. At night with nsomnia she thinks of movie stars and movies. She is doing well on the neupro patch forRLS. She gets cramps at night. Will try gabapentin for RLS and also for her cramping. She feels has difficulty with expression, she stammers, recommended speech therapy.   07/2017: Significant decline, agitation and frustration, changing personality, memory loss. She is having difficulty with words, she forgets words, she can't get it out, she sometimes has to describe the item she is thinking about. They are going swimming, loves [facility]. They do water aerobics.  Husband has had surgery andis getting over it. She is more active than they have been in the past. Husband is her Geneticist, molecular. Having trouble with words, she is trying to jot down words in a book and study them. She loses things or forget them somewhere. She may place her wallet in the garage  and then forget it is there and she finds it. She left her phone in the garden. Short term is worsening. She doesn't remember appointments. She forgot about her blood tests. Still cooking and backing but very slow with directions, takes long to do complicated tasks she has to think more. Still driving. No accidents. She forgot how to put gas in the car, her husband had to show her.   Interval history 12/11/2016: Patient returns today with her daughter and husband to discuss neurocognitive testing which revealed mild cognitive impairment. I discussed that given her family history she is more at risk to progress to Alzheimer's disease. A portion of people with mild cognitive impairment do proceed to dementia. At this point I recommend trying to increase Exelon. Discussed clinical trials and patient is not interested. At a long discussion about mild cognitive impairment, things that we can do to further delineate such as FDG PET scans, other specialized scanning, research opportunities. Husband and daughter do feel as though she is more irritable and her short-term memory is worsening.  Interval history 05/07/2016: Janet Wilson a 85 y.o.femalehere as a referral from Dr. Val Eagle memory problems. She has a past medical history of arthritis, glaucoma, temporal arteritis.She is here with daughter and husband. Started on Aricept at last appointment. She returns with worsening memory complaints. Father had Alzheimers in his 69s.  She has been taking Aricept 5 mg for 6 months, half of the 10 mg tab as the 10 mg tablet caused side effects. Patient's memory continues to decline. Had a discussion with husband and daughter, she is becoming more disoriented and in familiar places, she  is losing things more, she is forgetting more appointments, dates and conversations.  Unfortunately patient denies any changes in his quite agitated with her family recently. Today I had to be very gentle when suggesting formal  neurocognitive testing, I did tell her that this is something we should do especially in people with family history inordinate to make her upset. Family really wants her to have it done for baseline and to evaluate her current cognition which I think is a good idea. Also discussed with him our clinical trials [facility] today which I think she would be wonderful for if she agrees to be part of it.  Janet Wilson a 85 y.o.femalehere as a referral from Dr. Val Eagle memory problems. She has a past medical history of arthritis, glaucoma, temporal arteritis.She is here with daughter and husband if there is anything that she started to exhibit problems with her memory.She is having memory lapses, increased frustration especially when this is mentioned to her, some increased episodes of anger, crying episodes. There are no mood issues but possibly she feels defensive when they point out. She doesn't remember. She is having episodes of memory loss, she forgot the details of her knee surgery. Also such as when she insists on things and then gets angry if she is proven wrong such as details on driving. Started within the last 3 months. She never had this behavior in the past. Denies any mood changes or depression. Father had Alzheimers. She has lost her sense of smell 15 years ago. She forgets appointments, gets the wrong date, forgets the dates o events, misses appointments. Husband had always done the bills. She tried to send an email and couldn't figure it out. Pain in the back of the head is better. [name] how old her father was when he died, maybe 63, they were closed but he had suffered from dementia for many years without point. Patient reports she has Stevenson issues, gets confused especially with driving, some episodes of confusion. She insisted she had a knee replacement when she didn't. Balance is good but she has had a few falls mostly mechanical, where she trips over something, or  when she was gardening and pulled too hard to get a weed up. No other focal neurologic deficits or complaints.  Review of Systems: Patient complains of symptoms per HPI as well as the following symptoms: none. Pertinent negatives per HPI. All others negative.  Social History   Socioeconomic History  . Marital status: Married    Spouse name: [name]  . Number of children: 2  . Years of education: college  . Highest education level: Not on file  Occupational History  . Occupation: retired    Fish farm manager: RETIRED  Tobacco Use  . Smoking status: Never Smoker  . Smokeless tobacco: Never Used  Vaping Use  . Vaping Use: Never used  Substance and Sexual Activity  . Alcohol use: Yes    Alcohol/week: 1.0 standard drink    Types: 1 Glasses of wine per week    Comment: SELDOM, very little  . Drug use: No  . Sexual activity: Yes  Other Topics Concern  . Not on file  Social History Narrative   Pt lives at home with family.   Caffeine Use: Very little.    Patient is right handed       Social History      Diet? No       Do you drink/eat things with caffeine? Some coffee  Marital status?    Married                                What year were you married? 1961      Do you live in a house, apartment, assisted living, condo, trailer, etc.?  [employer] [employer]       Is it one or more stories? one      How many persons live in your home? two      Do you have any pets in your home? No       Highest level of education completed? 14 years       Current or past profession:  Chemical engineer       Do you exercise?              Yes                        Type & how often? Water aerobic and balance class      Advanced Directives      Do you have a living will?  yes      Do you have a DNR form?        yes                          If not, do you want to discuss one?      Do you have signed POA/HPOA for forms? yes      Functional Status      Do you have difficulty bathing or dressing  yourself?      Do you have difficulty preparing food or eating?       Do you have difficulty managing your medications?      Do you have difficulty managing your finances?      Do you have difficulty affording your medications?   Social Determinants of Health   Financial Resource Strain:   . Difficulty of Paying Living Expenses: Not on file  Food Insecurity:   . Worried About Charity fundraiser in the Last Year: Not on file  . Ran Out of Food in the Last Year: Not on file  Transportation Needs:   . Lack of Transportation (Medical): Not on file  . Lack of Transportation (Non-Medical): Not on file  Physical Activity:   . Days of Exercise per Week: Not on file  . Minutes of Exercise per Session: Not on file  Stress:   . Feeling of Stress : Not on file  Social Connections:   . Frequency of Communication with Friends and Family: Not on file  . Frequency of Social Gatherings with Friends and Family: Not on file  . Attends Religious Services: Not on file  . Active Member of Clubs or Organizations: Not on file  . Attends Archivist Meetings: Not on file  . Marital Status: Not on file  Intimate Partner Violence:   . Fear of Current or Ex-Partner: Not on file  . Emotionally Abused: Not on file  . Physically Abused: Not on file  . Sexually Abused: Not on file    Family History  Problem Relation Age of Onset  . Alzheimer's disease Mother   . Heart attack Father     Past Medical History:  Diagnosis Date  . Arthritis    OA BOTH KNEES AND HANDS  . Atrial tachycardia ([place])  HX OF  . GERD (gastroesophageal reflux disease)   . Glaucoma    left eye  . Goiter    CAUSING COUGH, HOARSINESS AND DRY THROAT  . Headache(784.0)    MIGRAINES   . Overactive bladder   . Temporal arteritis [facility])     Past Surgical History:  Procedure Laterality Date  . ARTERY BIOPSY Right 07/21/2012   Procedure: BIOPSY TEMPORAL ARTERY;  Surgeon: Haywood Lasso, MD;  Location: [place];   Service: General;  Laterality: Right;  . CERVICAL FUSION  2012   Dr. Vertell Limber  . EYE SURGERY     CATARACT EXTRACTION- BILATERAL  . INCONTINENCE SURGERY    . JOINT REPLACEMENT     right knee  . LEFT SHOULDER SURGERY    . RIGHT KNEE ARTHROSCOPY  11/2009    . ROBOTIC ASSISTED BILATERAL SALPINGO OOPHERECTOMY Bilateral 12/08/2013   Procedure: ROBOTIC ASSISTED LAPAROSCOPIC BILATERAL SALPINGO OOPHORECTOMY WITH STAGING;  Surgeon: Everitt Amber, MD;  Location: [facility];  Service: Gynecology;  Laterality: Bilateral;  . TOTAL KNEE ARTHROPLASTY  07/04/2011   Procedure: TOTAL KNEE ARTHROPLASTY;  Surgeon: Gearlean Alf, MD;  Location: [facility];  Service: Orthopedics;  Laterality: Right;    Current Outpatient Medications  Medication Sig Dispense Refill  . aspirin EC 81 MG tablet Take 81 mg by mouth at bedtime.     . Biotin 5000 MCG CAPS Take 1 capsule by mouth daily.    Marland Kitchen BRIMONIDINE TARTRATE EX Apply topically.    . Calcium Carbonate-Vitamin D (CALCIUM PLUS VITAMIN D PO) Take 1 tablet by mouth 2 (two) times daily.     . cholecalciferol (VITAMIN D3) 25 MCG (1000 UT) tablet Take 1,000 Units by mouth daily.    . Coenzyme Q10 (CO Q 10) 100 MG CAPS Take 1 tablet by mouth at bedtime.     . ibandronate (BONIVA) 150 MG tablet     . Ibuprofen (ADVIL) 200 MG CAPS Take 400 mg by mouth daily as needed (pain.).    . Magnesium 200 MG TABS Take 1 tablet by mouth 2 (two) times daily.    Marland Kitchen neomycin-polymyxin-hydrocortisone (CORTISPORIN) OTIC solution 1-2 drops to toe twice daily after soaking 10 mL 0  . Omega-3 Fatty Acids (FISH OIL) 1200 MG CAPS Take 1 capsule by mouth daily.     . Probiotic Product (PROBIOTIC PO) Take 1 capsule by mouth at bedtime. Ultra Flora Balance.    . rivastigmine (EXELON) 1.5 MG capsule TAKE 1 CAPSULE EACH MORNING. 30 capsule 11  . rivastigmine (EXELON) 3 MG capsule Take 1 capsule (3 mg total) by mouth at bedtime. 30 capsule 11  . Rotigotine (NEUPRO) 1 MG/24HR PT24 Place 1 patch (1 mg total) onto  the skin at bedtime. 30 patch 11   No current facility-administered medications for this visit.    Allergies as of 10/08/2019 - Review Complete 10/08/2019  Allergen Reaction Noted  . Oxycodone Anaphylaxis 06/18/2012  . Mold extract [trichophyton]  05/07/2016  . Codeine Nausea Only 07/11/2012    Vitals: BP (!) 143/78   Pulse 75   Ht 5\' 3"  (1.6 m)   Wt 112 lb (50.8 kg)   LMP  (LMP Unknown)   BMI 19.84 kg/m  Last Weight:  Wt Readings from Last 1 Encounters:  10/08/19 112 lb (50.8 kg)   Last Height:   Ht Readings from Last 1 Encounters:  10/08/19 5\' 3"  (1.6 m)   Physical exam: Exam: Gen: slightly agitated, conversant, well nourised, obese, well groomed  CV: RRR, no MRG. No Carotid Bruits. No peripheral edema, warm, nontender Eyes: Conjunctivae clear without exudates or hemorrhage  Neuro: Detailed Neurologic Exam  Speech:    Speech is normal; fluent and spontaneous with normal comprehension.  Cognition: MMSE 07/2015 30/30 MMSE 04/2016 25 MMSE not completed today    The patient is oriented to person, place, and time;     recent memory impaired and remote memory intact;     language fluent;     normal attention, concentration, and fund of knowledge Cranial Nerves:    The pupils are equal, round, and reactive to light. The fundi are normal and spontaneous venous pulsations are present. Visual fields are full to finger confrontation. Extraocular movements are intact. Trigeminal sensation is intact and the muscles of mastication are normal. The face is symmetric. The palate elevates in the midline. Hearing intact. Voice is normal. Shoulder shrug is normal. The tongue has normal motion without fasciculations.   Coordination:    Normal finger to nose and heel to shin. Normal rapid alternating movements.   Gait:    Heel-toe and tandem gait are normal.   Motor Observation:    No asymmetry, no atrophy, and no involuntary movements noted. Tone:    Normal  muscle tone.    Posture:    Posture is normal. normal erect    Strength:    Strength is V/V in the upper and lower limbs.      Sensation: intact to LT     Reflex Exam:  DTR's:    Deep tendon reflexes in the upper and lower extremities are symmetrical bilaterally.   Toes:    The toes are downgoing bilaterally.   Clonus:    Clonus is absent.     Assessment/Plan:This is an extremely lovely 54year-old patient Father had Alzheimer's in his 55s. MMSE 07/2015 30/30, MMSE 04/2016 25, 09/2016 diagnosed with MCI but more recently neurocognitive testing normal.   Memory stable.  Continue Exelon  RLS stable.  Continue neupro patch -discussed financial assistance program and provide additional information to help cover co-pay costs  Hx of temporal arteritis -no reoccurring symptoms; prior labs normal  Migraines stable  No orders of the defined types were placed in this encounter.  Meds ordered this encounter  Medications  . rivastigmine (EXELON) 3 MG capsule    Sig: Take 1 capsule (3 mg total) by mouth at bedtime.    Dispense:  30 capsule    Refill:  11  . rivastigmine (EXELON) 1.5 MG capsule    Sig: TAKE 1 CAPSULE EACH MORNING.    Dispense:  30 capsule    Refill:  11  . Rotigotine (NEUPRO) 1 MG/24HR PT24    Sig: Place 1 patch (1 mg total) onto the skin at bedtime.    Dispense:  30 patch    Refill:  11      Cc: Dr. Inda Merlin  I spent 20 minutes of face-to-face and non-face-to-face time with patient.  This included previsit chart review, lab review, study review, order entry, electronic health record documentation, patient education regarding memory, RLS, ongoing use of medications and answered all questions to patient satisfaction  Frann Rider, [facility]  [facility] [address] [place], [place] [address]  Phone [phone] Fax [phone] Note: This document was prepared with digital dictation and possible smart phrase technology. Any  transcriptional errors that result from this process are unintentional.  Made any corrections needed, and agree with history, physical, neuro exam,assessment and plan as stated.  Sarina Ill, MD [facility]

## 2019-10-08 NOTE — Patient Instructions (Addendum)
Your Plan:  Continue exelon 1.5mg  AM and 3mg  PM  Continue neupro patch for restless leg syndrome  [email or web address] Go to this website for additional information for financial assistance to obtain neupro   Follow up in 1 year or call earlier if needed     Thank you for coming to see [place] at [facility]. I hope we have been able to provide you high quality care today.  You may receive a patient satisfaction survey over the next few weeks. We would appreciate your feedback and comments so that we may continue to improve ourselves and the health of our patients.

## 2019-12-18 ENCOUNTER — Encounter: Payer: Self-pay | Admitting: Cardiology

## 2019-12-18 ENCOUNTER — Ambulatory Visit: Payer: Medicare Other | Admitting: Cardiology

## 2019-12-18 ENCOUNTER — Other Ambulatory Visit: Payer: Self-pay

## 2019-12-18 VITALS — BP 118/62 | HR 77 | Ht 63.0 in | Wt 115.0 lb

## 2019-12-18 DIAGNOSIS — I351 Nonrheumatic aortic (valve) insufficiency: Secondary | ICD-10-CM | POA: Diagnosis not present

## 2019-12-18 DIAGNOSIS — I471 Supraventricular tachycardia: Secondary | ICD-10-CM | POA: Diagnosis not present

## 2019-12-18 DIAGNOSIS — E7800 Pure hypercholesterolemia, unspecified: Secondary | ICD-10-CM | POA: Diagnosis not present

## 2019-12-18 DIAGNOSIS — I700 Atherosclerosis of aorta: Secondary | ICD-10-CM | POA: Diagnosis not present

## 2019-12-18 DIAGNOSIS — I2584 Coronary atherosclerosis due to calcified coronary lesion: Secondary | ICD-10-CM

## 2019-12-18 DIAGNOSIS — I2510 Atherosclerotic heart disease of native coronary artery without angina pectoris: Secondary | ICD-10-CM

## 2019-12-18 MED ORDER — ROSUVASTATIN CALCIUM 10 MG PO TABS
10.0000 mg | ORAL_TABLET | Freq: Every day | ORAL | 3 refills | Status: DC
Start: 2019-12-18 — End: 2020-09-20

## 2019-12-18 NOTE — Progress Notes (Addendum)
Cardiology Office Note:    Date:  12/18/2019   ID:  Janet Wilson, DOB [birth year], MRN [id number]  PCP:  Josetta Huddle, MD  [facility] Cardiologist:  No primary care provider on file.  [facility] Electrophysiologist:  None   Referring MD: Josetta Huddle, MD     History of Present Illness:    Janet Wilson is a 85 y.o. female here for the evaluation of coronary artery atherosclerosis, atrial tachycardia at the request of Dr. Inda Merlin.  Has had GERD, memory changes, hypertension.  Her mother died at age 6 from myocardial infarction but she was a smoker. Has strong family history of CAD.  She never smoked.  Back in 2018 had ectopic atrial tachycardia and echocardiogram in 2013 moderate aortic valve sclerosis as well as mild aortic regurgitation.  My last office visit with her was over 3 years ago on 10/08/2016. In review of that note, in 2017 she was seen at [facility] cardiology clinic without complaints reporting water aerobics and moving to retirement community in [place]. She wore an event monitor which describes short runs of SVT probable ectopic atrial rhythm or paroxysmal atrial tachycardia. No evidence of atrial flutter or fibrillation. She had an episode lasting about 30 minutes are a heart pounding through her clothing she stated back then. Has not had any further episodes.  She showed sinus rhythm with left bundle branch block 84.  Overall doing quite well.  No fevers chills nausea vomiting syncope bleeding.  Occasional left lower extremity cramping.  Takes magnesium.  Also has some short-term memory and word finding difficulty.  Past Medical History:  Diagnosis Date  . Arthritis    OA BOTH KNEES AND HANDS  . Atrial tachycardia (HCC)    HX OF  . GERD (gastroesophageal reflux disease)   . Glaucoma    left eye  . Goiter    CAUSING COUGH, HOARSINESS AND DRY THROAT  . Headache(784.0)    MIGRAINES   . Overactive bladder   .  Temporal arteritis [facility])     Past Surgical History:  Procedure Laterality Date  . ARTERY BIOPSY Right 07/21/2012   Procedure: BIOPSY TEMPORAL ARTERY;  Surgeon: Haywood Lasso, MD;  Location: [place];  Service: General;  Laterality: Right;  . CERVICAL FUSION  2012   Dr. Vertell Limber  . EYE SURGERY     CATARACT EXTRACTION- BILATERAL  . INCONTINENCE SURGERY    . JOINT REPLACEMENT     right knee  . LEFT SHOULDER SURGERY    . RIGHT KNEE ARTHROSCOPY  11/2009    . ROBOTIC ASSISTED BILATERAL SALPINGO OOPHERECTOMY Bilateral 12/08/2013   Procedure: ROBOTIC ASSISTED LAPAROSCOPIC BILATERAL SALPINGO OOPHORECTOMY WITH STAGING;  Surgeon: Everitt Amber, MD;  Location: [facility];  Service: Gynecology;  Laterality: Bilateral;  . TOTAL KNEE ARTHROPLASTY  07/04/2011   Procedure: TOTAL KNEE ARTHROPLASTY;  Surgeon: Gearlean Alf, MD;  Location: [facility];  Service: Orthopedics;  Laterality: Right;    Current Medications: Current Meds  Medication Sig  . Biotin 5000 MCG CAPS Take 1 capsule by mouth daily.  Marland Kitchen BRIMONIDINE TARTRATE EX Apply topically.  . Calcium Carbonate-Vitamin D (CALCIUM PLUS VITAMIN D PO) Take 1 tablet by mouth 2 (two) times daily.   . cholecalciferol (VITAMIN D3) 25 MCG (1000 UT) tablet Take 1,000 Units by mouth daily.  . Coenzyme Q10 (CO Q 10) 100 MG CAPS Take 1 tablet by mouth at bedtime.   . ibandronate (BONIVA) 150 MG tablet Take 150 mg by mouth  every 30 (thirty) days.   . Ibuprofen (ADVIL) 200 MG CAPS Take 400 mg by mouth daily as needed (pain.).  . Magnesium 200 MG TABS Take 1 tablet by mouth 2 (two) times daily.  Marland Kitchen neomycin-polymyxin-hydrocortisone (CORTISPORIN) OTIC solution 1-2 drops to toe twice daily after soaking  . Omega-3 Fatty Acids (FISH OIL) 1200 MG CAPS Take 1 capsule by mouth daily.   . Probiotic Product (PROBIOTIC PO) Take 1 capsule by mouth at bedtime. Ultra Flora Balance.  . rivastigmine (EXELON) 1.5 MG capsule TAKE 1 CAPSULE EACH MORNING.  . rivastigmine (EXELON) 3 MG capsule  Take 1 capsule (3 mg total) by mouth at bedtime.  . Rotigotine (NEUPRO) 1 MG/24HR PT24 Place 1 patch (1 mg total) onto the skin at bedtime.     Allergies:   Oxycodone, Mold extract [trichophyton], and Codeine   Social History   Socioeconomic History  . Marital status: Married    Spouse name: [name]  . Number of children: 2  . Years of education: college  . Highest education level: Not on file  Occupational History  . Occupation: retired    Fish farm manager: RETIRED  Tobacco Use  . Smoking status: Never Smoker  . Smokeless tobacco: Never Used  Vaping Use  . Vaping Use: Never used  Substance and Sexual Activity  . Alcohol use: Yes    Alcohol/week: 1.0 standard drink    Types: 1 Glasses of wine per week    Comment: SELDOM, very little  . Drug use: No  . Sexual activity: Yes  Other Topics Concern  . Not on file  Social History Narrative   Pt lives at home with family.   Caffeine Use: Very little.    Patient is right handed       Social History      Diet? No       Do you drink/eat things with caffeine? Some coffee      Marital status?    Married                                What year were you married? 1961      Do you live in a house, apartment, assisted living, condo, trailer, etc.?  [employer]       Is it one or more stories? one      How many persons live in your home? two      Do you have any pets in your home? No       Highest level of education completed? 14 years       Current or past profession:  Chemical engineer       Do you exercise?              Yes                        Type & how often? Water aerobic and balance class      Advanced Directives      Do you have a living will?  yes      Do you have a DNR form?        yes                          If not, do you want to discuss one?      Do you have signed POA/HPOA for forms? yes  Functional Status      Do you have difficulty bathing or dressing yourself?      Do you have difficulty preparing food or  eating?       Do you have difficulty managing your medications?      Do you have difficulty managing your finances?      Do you have difficulty affording your medications?   Social Determinants of Health   Financial Resource Strain:   . Difficulty of Paying Living Expenses: Not on file  Food Insecurity:   . Worried About Charity fundraiser in the Last Year: Not on file  . Ran Out of Food in the Last Year: Not on file  Transportation Needs:   . Lack of Transportation (Medical): Not on file  . Lack of Transportation (Non-Medical): Not on file  Physical Activity:   . Days of Exercise per Week: Not on file  . Minutes of Exercise per Session: Not on file  Stress:   . Feeling of Stress : Not on file  Social Connections:   . Frequency of Communication with Friends and Family: Not on file  . Frequency of Social Gatherings with Friends and Family: Not on file  . Attends Religious Services: Not on file  . Active Member of Clubs or Organizations: Not on file  . Attends Archivist Meetings: Not on file  . Marital Status: Not on file     Family History: The patient's family history includes Alzheimer's disease in her mother; Heart attack in her father.  ROS:   Please see the history of present illness.    Denies any fevers chills nausea vomiting syncope bleeding all other systems reviewed and are negative.  EKGs/Labs/Other Studies Reviewed:    The following studies were reviewed today: As above in HPI including outside office note, echocardiogram, event monitor, labs  EKG:  EKG is  ordered today.  The ekg ordered today demonstrates sinus rhythm left bundle branch block 77  Recent Labs: No results found for requested labs within last 8760 hours.  Recent Lipid Panel    Component Value Date/Time   CHOL 194 12/11/2017 1024   TRIG 53 12/11/2017 1024   HDL 59 12/11/2017 1024   LDLCALC 125 12/11/2017 1024     Risk Assessment/Calculations:       Physical Exam:     VS:  BP 118/62   Pulse 77   Ht 5\' 3"  (1.6 m)   Wt 115 lb (52.2 kg)   LMP  (LMP Unknown)   SpO2 94%   BMI 20.37 kg/m     Wt Readings from Last 3 Encounters:  12/18/19 115 lb (52.2 kg)  10/08/19 112 lb (50.8 kg)  03/19/18 115 lb (52.2 kg)     GEN:  Well nourished, well developed in no acute distress HEENT: Normal, wonderful red glasses NECK: No JVD; No carotid bruits LYMPHATICS: No lymphadenopathy CARDIAC: RRR, 1/6 systolic murmur, no rubs, gallops RESPIRATORY:  Clear to auscultation without rales, wheezing or rhonchi  ABDOMEN: Soft, non-tender, non-distended MUSCULOSKELETAL:  No edema; No deformity  SKIN: Warm and dry NEUROLOGIC:  Alert and oriented x 3 PSYCHIATRIC:  Normal affect   ASSESSMENT:    1. Aortic valve insufficiency, etiology of cardiac valve disease unspecified   2. Ectopic atrial tachycardia (HCC)   3. Pure hypercholesterolemia   4. Aortic atherosclerosis ([place])   5. Coronary artery calcification    PLAN:    In order of problems listed above:  Paroxysmal atrial tachycardia/ectopic atrial  rhythm -Previously seen on event monitor in 2017, benign. Likely no evidence of atrial fibrillation at that time. We had spoken previously about potential beta-blocker, not interested at that time. Reassurance.  Mild aortic regurgitation with moderate aortic valve sclerosis -This was seen on echocardiogram in 2013. We will go ahead and repeat echocardiogram.  Diastolic murmur heard.  Family history of CAD -Previous family members in their 50s died from heart disease. Given her age of 60, she has surpassed this genetic component. Excellent. Continue with diet, activity. [facility] is at [facility]. -Hemoglobin 13.7 sodium 141 potassium 4.3 creatinine 0.7 ALT 11 TSH 0.39 LDL 138 HDL 51. Memory impairment -Medications reviewed per Dr. Inda Merlin.   Coronary artery calcification/aortic atherosclerosis -Noted on prior CT angiogram of chest reviewed personally from 2013. -We will go  ahead and start Crestor 10 mg once a day for plaque stabilization.  Prior LDL 138 in February.  ALT was 11.  We will go ahead and recheck in 3 months.  See her back in 6 months.  I also take care of her husband.  Addendum: 01/14/2020 -Echocardiogram demonstrated markedly dilated ascending aorta 55 mm.  Consulting cardiothoracic surgery and performing CTA of aorta. Candee Furbish, MD   Shared Decision Making/Informed Consent      Medication Adjustments/Labs and Tests Ordered: Current medicines are reviewed at length with the patient today.  Concerns regarding medicines are outlined above.  Orders Placed This Encounter  Procedures  . ALT  . Lipid panel  . EKG 12-Lead  . ECHOCARDIOGRAM COMPLETE   Meds ordered this encounter  Medications  . rosuvastatin (CRESTOR) 10 MG tablet    Sig: Take 1 tablet (10 mg total) by mouth daily.    Dispense:  90 tablet    Refill:  3    Patient Instructions  Medication Instructions:  Your physician has recommended you make the following change in your medication:  1.  START Crestor 10 mg taking 1 tablet daily    *If you need a refill on your cardiac medications before your next appointment, please call your pharmacy*   Lab Work: 3 MONTHS:  LIPID & ALT  If you have labs (blood work) drawn today and your tests are completely normal, you will receive your results only by: Marland Kitchen MyChart Message (if you have MyChart) OR . A paper copy in the mail If you have any lab test that is abnormal or we need to change your treatment, we will call you to review the results.   Testing/Procedures: Your physician has requested that you have an echocardiogram. Echocardiography is a painless test that uses sound waves to create images of your heart. It provides your doctor with information about the size and shape of your heart and how well your heart's chambers and valves are working. This procedure takes approximately one hour. There are no restrictions for this  procedure.     Follow-Up: At [facility], you and your health needs are our priority.  As part of our continuing mission to provide you with exceptional heart care, we have created designated Provider Care Teams.  These Care Teams include your primary Cardiologist (physician) and Advanced Practice Providers (APPs -  Physician Assistants and Nurse Practitioners) who all work together to provide you with the care you need, when you need it.  We recommend signing up for the patient portal called "MyChart".  Sign up information is provided on this After Visit Summary.  MyChart is used to connect with patients for Virtual Visits (Telemedicine).  Patients are able to view lab/test results, encounter notes, upcoming appointments, etc.  Non-urgent messages can be sent to your provider as well.   To learn more about what you can do with MyChart, go to [email or web address].    Your next appointment:   6 month(s)  The format for your next appointment:   In Person  Provider:   Candee Furbish, MD   Other Instructions  Echocardiogram An echocardiogram is a procedure that uses painless sound waves (ultrasound) to produce an image of the heart. Images from an echocardiogram can provide important information about:  Signs of coronary artery disease (CAD).  Aneurysm detection. An aneurysm is a weak or damaged part of an artery wall that bulges out from the normal force of blood pumping through the body.  Heart size and shape. Changes in the size or shape of the heart can be associated with certain conditions, including heart failure, aneurysm, and CAD.  Heart muscle function.  Heart valve function.  Signs of a past heart attack.  Fluid buildup around the heart.  Thickening of the heart muscle.  A tumor or infectious growth around the heart valves. Tell a health care provider about:  Any allergies you have.  All medicines you are taking, including vitamins, herbs, eye drops, creams,  and over-the-counter medicines.  Any blood disorders you have.  Any surgeries you have had.  Any medical conditions you have.  Whether you are pregnant or may be pregnant. What are the risks? Generally, this is a safe procedure. However, problems may occur, including:  Allergic reaction to dye (contrast) that may be used during the procedure. What happens before the procedure? No specific preparation is needed. You may eat and drink normally. What happens during the procedure?   An IV tube may be inserted into one of your veins.  You may receive contrast through this tube. A contrast is an injection that improves the quality of the pictures from your heart.  A gel will be applied to your chest.  A wand-like tool (transducer) will be moved over your chest. The gel will help to transmit the sound waves from the transducer.  The sound waves will harmlessly bounce off of your heart to allow the heart images to be captured in real-time motion. The images will be recorded on a computer. The procedure may vary among health care providers and hospitals. What happens after the procedure?  You may return to your normal, everyday life, including diet, activities, and medicines, unless your health care provider tells you not to do that. Summary  An echocardiogram is a procedure that uses painless sound waves (ultrasound) to produce an image of the heart.  Images from an echocardiogram can provide important information about the size and shape of your heart, heart muscle function, heart valve function, and fluid buildup around your heart.  You do not need to do anything to prepare before this procedure. You may eat and drink normally.  After the echocardiogram is completed, you may return to your normal, everyday life, unless your health care provider tells you not to do that. This information is not intended to replace advice given to you by your health care provider. Make sure you  discuss any questions you have with your health care provider. Document Revised: 05/22/2018 Document Reviewed: 03/03/2016 Elsevier Patient Education  [place], MD  12/18/2019 10:54 AM    [place]

## 2019-12-18 NOTE — Patient Instructions (Signed)
Medication Instructions:  Your physician has recommended you make the following change in your medication:  1.  START Crestor 10 mg taking 1 tablet daily    *If you need a refill on your cardiac medications before your next appointment, please call your pharmacy*   Lab Work: 3 MONTHS:  LIPID & ALT  If you have labs (blood work) drawn today and your tests are completely normal, you will receive your results only by: Marland Kitchen MyChart Message (if you have MyChart) OR . A paper copy in the mail If you have any lab test that is abnormal or we need to change your treatment, we will call you to review the results.   Testing/Procedures: Your physician has requested that you have an echocardiogram. Echocardiography is a painless test that uses sound waves to create images of your heart. It provides your doctor with information about the size and shape of your heart and how well your heart's chambers and valves are working. This procedure takes approximately one hour. There are no restrictions for this procedure.     Follow-Up: At [facility], you and your health needs are our priority.  As part of our continuing mission to provide you with exceptional heart care, we have created designated Provider Care Teams.  These Care Teams include your primary Cardiologist (physician) and Advanced Practice Providers (APPs -  Physician Assistants and Nurse Practitioners) who all work together to provide you with the care you need, when you need it.  We recommend signing up for the patient portal called "MyChart".  Sign up information is provided on this After Visit Summary.  MyChart is used to connect with patients for Virtual Visits (Telemedicine).  Patients are able to view lab/test results, encounter notes, upcoming appointments, etc.  Non-urgent messages can be sent to your provider as well.   To learn more about what you can do with MyChart, go to [email or web address].    Your next appointment:   6  month(s)  The format for your next appointment:   In Person  Provider:   Candee Furbish, MD   Other Instructions  Echocardiogram An echocardiogram is a procedure that uses painless sound waves (ultrasound) to produce an image of the heart. Images from an echocardiogram can provide important information about:  Signs of coronary artery disease (CAD).  Aneurysm detection. An aneurysm is a weak or damaged part of an artery wall that bulges out from the normal force of blood pumping through the body.  Heart size and shape. Changes in the size or shape of the heart can be associated with certain conditions, including heart failure, aneurysm, and CAD.  Heart muscle function.  Heart valve function.  Signs of a past heart attack.  Fluid buildup around the heart.  Thickening of the heart muscle.  A tumor or infectious growth around the heart valves. Tell a health care provider about:  Any allergies you have.  All medicines you are taking, including vitamins, herbs, eye drops, creams, and over-the-counter medicines.  Any blood disorders you have.  Any surgeries you have had.  Any medical conditions you have.  Whether you are pregnant or may be pregnant. What are the risks? Generally, this is a safe procedure. However, problems may occur, including:  Allergic reaction to dye (contrast) that may be used during the procedure. What happens before the procedure? No specific preparation is needed. You may eat and drink normally. What happens during the procedure?   An IV tube may be inserted into one  of your veins.  You may receive contrast through this tube. A contrast is an injection that improves the quality of the pictures from your heart.  A gel will be applied to your chest.  A wand-like tool (transducer) will be moved over your chest. The gel will help to transmit the sound waves from the transducer.  The sound waves will harmlessly bounce off of your heart to allow  the heart images to be captured in real-time motion. The images will be recorded on a computer. The procedure may vary among health care providers and hospitals. What happens after the procedure?  You may return to your normal, everyday life, including diet, activities, and medicines, unless your health care provider tells you not to do that. Summary  An echocardiogram is a procedure that uses painless sound waves (ultrasound) to produce an image of the heart.  Images from an echocardiogram can provide important information about the size and shape of your heart, heart muscle function, heart valve function, and fluid buildup around your heart.  You do not need to do anything to prepare before this procedure. You may eat and drink normally.  After the echocardiogram is completed, you may return to your normal, everyday life, unless your health care provider tells you not to do that. This information is not intended to replace advice given to you by your health care provider. Make sure you discuss any questions you have with your health care provider. Document Revised: 05/22/2018 Document Reviewed: 03/03/2016 Elsevier Patient Education  [place].

## 2020-01-12 ENCOUNTER — Ambulatory Visit (HOSPITAL_COMMUNITY): Payer: Medicare Other | Attending: Cardiology

## 2020-01-12 ENCOUNTER — Other Ambulatory Visit: Payer: Self-pay

## 2020-01-12 DIAGNOSIS — I351 Nonrheumatic aortic (valve) insufficiency: Secondary | ICD-10-CM | POA: Diagnosis not present

## 2020-01-12 LAB — ECHOCARDIOGRAM COMPLETE
Area-P 1/2: 4.15 cm2
P 1/2 time: 264 msec
S' Lateral: 3.2 cm

## 2020-01-14 ENCOUNTER — Ambulatory Visit
Admission: RE | Admit: 2020-01-14 | Discharge: 2020-01-14 | Disposition: A | Discharge status: Home / Self Care | Payer: Medicare Other | Source: Ambulatory Visit | Attending: Cardiology | Admitting: Cardiology

## 2020-01-14 ENCOUNTER — Other Ambulatory Visit: Payer: Self-pay

## 2020-01-14 ENCOUNTER — Ambulatory Visit: Payer: Medicare Other | Admitting: Cardiology

## 2020-01-14 ENCOUNTER — Encounter: Payer: Self-pay | Admitting: Cardiology

## 2020-01-14 ENCOUNTER — Ambulatory Visit (INDEPENDENT_AMBULATORY_CARE_PROVIDER_SITE_OTHER)
Admission: RE | Admit: 2020-01-14 | Discharge: 2020-01-14 | Disposition: A | Discharge status: Home / Self Care | Payer: Medicare Other | Source: Ambulatory Visit | Attending: Cardiology | Admitting: Cardiology

## 2020-01-14 VITALS — BP 130/60 | HR 70 | Ht 63.0 in | Wt 115.0 lb

## 2020-01-14 DIAGNOSIS — Z01812 Encounter for preprocedural laboratory examination: Secondary | ICD-10-CM | POA: Diagnosis not present

## 2020-01-14 DIAGNOSIS — I719 Aortic aneurysm of unspecified site, without rupture: Secondary | ICD-10-CM | POA: Diagnosis not present

## 2020-01-14 LAB — BASIC METABOLIC PANEL
BUN/Creatinine Ratio: 39 — ABNORMAL HIGH (ref 12–28)
BUN: 28 mg/dL — ABNORMAL HIGH (ref 8–27)
CO2: 25 mmol/L (ref 20–29)
Calcium: 10.1 mg/dL (ref 8.7–10.3)
Chloride: 107 mmol/L — ABNORMAL HIGH (ref 96–106)
Creatinine, Ser: 0.71 mg/dL (ref 0.57–1.00)
GFR calc Af Amer: 91 mL/min/{1.73_m2} (ref >59)
GFR calc non Af Amer: 79 mL/min/{1.73_m2} (ref >59)
Glucose: 120 mg/dL — ABNORMAL HIGH (ref 65–99)
Potassium: 4.6 mmol/L (ref 3.5–5.2)
Sodium: 140 mmol/L (ref 134–144)

## 2020-01-14 MED ORDER — IOHEXOL 350 MG/ML SOLN
100.0000 mL | Freq: Once | INTRAVENOUS | Status: AC | PRN
  Administered 2020-01-14: 100 mL via INTRAVENOUS

## 2020-01-14 NOTE — Patient Instructions (Addendum)
Medication Instructions:  The current medical regimen is effective;  continue present plan and medications.  *If you need a refill on your cardiac medications before your next appointment, please call your pharmacy*  Lab Work: Please have blood work today (BMP)  If you have labs (blood work) drawn today and your tests are completely normal, you will receive your results only by: Marland Kitchen MyChart Message (if you have MyChart) OR . A paper copy in the mail If you have any lab test that is abnormal or we need to change your treatment, we will call you to review the results.  Testing/Procedures: Non-Cardiac CT Angiography (CTA), is a special type of CT scan that uses a computer to produce multi-dimensional views of major blood vessels throughout the body. In CT angiography, a contrast material is injected through an IV to help visualize the blood vessels This will be a CT of your chest and abdomen.  This will be completed here at our office.  You have been referred to [facility] and will be called for the appointment. (Probably Tuesday 12/7)  Follow-Up: At [facility], you and your health needs are our priority.  As part of our continuing mission to provide you with exceptional heart care, we have created designated Provider Care Teams.  These Care Teams include your primary Cardiologist (physician) and Advanced Practice Providers (APPs -  Physician Assistants and Nurse Practitioners) who all work together to provide you with the care you need, when you need it.  We recommend signing up for the patient portal called "MyChart".  Sign up information is provided on this After Visit Summary.  MyChart is used to connect with patients for Virtual Visits (Telemedicine).  Patients are able to view lab/test results, encounter notes, upcoming appointments, etc.  Non-urgent messages can be sent to your provider as well.   To learn more about what you can do with MyChart, go to [email or web address].    Your next  appointment:   Follow up with Dr Marlou Porch to be determined.  Thank you for choosing [facility]!!

## 2020-01-14 NOTE — Progress Notes (Signed)
Cardiology Office Note:    Date:  01/14/2020   ID:  Doristine Mango Ng, DOB 27-Jan-1935, MRN [id number]  PCP:  Josetta Huddle, MD  [facility] Cardiologist:  No primary care provider on file.  [facility] Electrophysiologist:  None   Referring MD: Josetta Huddle, MD     History of Present Illness:    Ayumi Wangerin Marrs is a 85 y.o. female here to discuss newly discovered ascending aortic aneurysm of 55 mm.  She also has coronary artery atherosclerosis atrial tachycardia as well.  Mother died at age 83 from myocardial infarction, she was a smoker at the time.  Has a strong family history of CAD.  Back in 2018 had ectopic atrial tachycardia.  Echocardiogram in 2013 showed mild aortic regurgitation at the time.  This is progressed to moderate.  Event monitor showed short runs of SVT ectopic atrial tachycardia.  No evidence of atrial fibrillation.  Left bundle branch block previous EKG 84.  Has occasional short-term memory difficulty with word finding.  Takes magnesium for cramping.  In review of that note, in 2017 she was seen at [facility]ology clinic without complaints reporting water aerobics and moving to retirement community in [place]. She wore an event monitor which describes short runs of SVT probable ectopic atrial rhythm or paroxysmal atrial tachycardia. No evidence of atrial flutter or fibrillation. She had an episode lasting about 30 minutes are a heart pounding through her clothing she stated back then. Has not had any further episodes.  Overall doing feeling well.  No chest pain no shortness of breath no strokelike symptoms no orthopnea.  She did have right shoulder pain at one point that was deemed musculoskeletal.   Past Medical History:  Diagnosis Date  . Arthritis    OA BOTH KNEES AND HANDS  . Atrial tachycardia (HCC)    HX OF  . GERD (gastroesophageal reflux disease)   . Glaucoma    left eye  . Goiter    CAUSING COUGH,  HOARSINESS AND DRY THROAT  . Headache(784.0)    MIGRAINES   . Overactive bladder   . Temporal arteritis [facility])     Past Surgical History:  Procedure Laterality Date  . ARTERY BIOPSY Right 07/21/2012   Procedure: BIOPSY TEMPORAL ARTERY;  Surgeon: Haywood Lasso, MD;  Location: [facility];  Service: General;  Laterality: Right;  . CERVICAL FUSION  2012   Dr. Vertell Limber  . EYE SURGERY     CATARACT EXTRACTION- BILATERAL  . INCONTINENCE SURGERY    . JOINT REPLACEMENT     right knee  . LEFT SHOULDER SURGERY    . RIGHT KNEE ARTHROSCOPY  11/2009    . ROBOTIC ASSISTED BILATERAL SALPINGO OOPHERECTOMY Bilateral 12/08/2013   Procedure: ROBOTIC ASSISTED LAPAROSCOPIC BILATERAL SALPINGO OOPHORECTOMY WITH STAGING;  Surgeon: Everitt Amber, MD;  Location: [facility];  Service: Gynecology;  Laterality: Bilateral;  . TOTAL KNEE ARTHROPLASTY  07/04/2011   Procedure: TOTAL KNEE ARTHROPLASTY;  Surgeon: Gearlean Alf, MD;  Location: [facility];  Service: Orthopedics;  Laterality: Right;    Current Medications: Current Meds  Medication Sig  . Biotin 5000 MCG CAPS Take 1 capsule by mouth daily.  Marland Kitchen BRIMONIDINE TARTRATE EX Apply topically.  . Calcium Carbonate-Vitamin D (CALCIUM PLUS VITAMIN D PO) Take 1 tablet by mouth 2 (two) times daily.   . cholecalciferol (VITAMIN D3) 25 MCG (1000 UT) tablet Take 1,000 Units by mouth daily.  . Coenzyme Q10 (CO Q 10) 100 MG CAPS Take 1 tablet  by mouth at bedtime.   . ibandronate (BONIVA) 150 MG tablet Take 150 mg by mouth every 30 (thirty) days.   . Ibuprofen (ADVIL) 200 MG CAPS Take 400 mg by mouth daily as needed (pain.).  . Magnesium 200 MG TABS Take 1 tablet by mouth 2 (two) times daily.  Marland Kitchen neomycin-polymyxin-hydrocortisone (CORTISPORIN) OTIC solution 1-2 drops to toe twice daily after soaking  . Omega-3 Fatty Acids (FISH OIL) 1200 MG CAPS Take 1 capsule by mouth daily.   . Probiotic Product (PROBIOTIC PO) Take 1 capsule by mouth at bedtime. Ultra Flora Balance.  . rivastigmine  (EXELON) 1.5 MG capsule TAKE 1 CAPSULE EACH MORNING.  . rivastigmine (EXELON) 3 MG capsule Take 1 capsule (3 mg total) by mouth at bedtime.  . rosuvastatin (CRESTOR) 10 MG tablet Take 1 tablet (10 mg total) by mouth daily.  . Rotigotine (NEUPRO) 1 MG/24HR PT24 Place 1 patch (1 mg total) onto the skin at bedtime.     Allergies:   Oxycodone, Mold extract [trichophyton], and Codeine   Social History   Socioeconomic History  . Marital status: Married    Spouse name: [name]  . Number of children: 2  . Years of education: college  . Highest education level: Not on file  Occupational History  . Occupation: retired    Fish farm manager: RETIRED  Tobacco Use  . Smoking status: Never Smoker  . Smokeless tobacco: Never Used  Vaping Use  . Vaping Use: Never used  Substance and Sexual Activity  . Alcohol use: Yes    Alcohol/week: 1.0 standard drink    Types: 1 Glasses of wine per week    Comment: SELDOM, very little  . Drug use: No  . Sexual activity: Yes  Other Topics Concern  . Not on file  Social History Narrative   Pt lives at home with family.   Caffeine Use: Very little.    Patient is right handed       Social History      Diet? No       Do you drink/eat things with caffeine? Some coffee      Marital status?    Married                                What year were you married? 1961      Do you live in a house, apartment, assisted living, condo, trailer, etc.?  [employer]       Is it one or more stories? one      How many persons live in your home? two      Do you have any pets in your home? No       Highest level of education completed? 14 years       Current or past profession:  Chemical engineer       Do you exercise?              Yes                        Type & how often? Water aerobic and balance class      Advanced Directives      Do you have a living will?  yes      Do you have a DNR form?        yes  If not, do you want to discuss one?       Do you have signed POA/HPOA for forms? yes      Functional Status      Do you have difficulty bathing or dressing yourself?      Do you have difficulty preparing food or eating?       Do you have difficulty managing your medications?      Do you have difficulty managing your finances?      Do you have difficulty affording your medications?   Social Determinants of Health   Financial Resource Strain:   . Difficulty of Paying Living Expenses: Not on file  Food Insecurity:   . Worried About Charity fundraiser in the Last Year: Not on file  . Ran Out of Food in the Last Year: Not on file  Transportation Needs:   . Lack of Transportation (Medical): Not on file  . Lack of Transportation (Non-Medical): Not on file  Physical Activity:   . Days of Exercise per Week: Not on file  . Minutes of Exercise per Session: Not on file  Stress:   . Feeling of Stress : Not on file  Social Connections:   . Frequency of Communication with Friends and Family: Not on file  . Frequency of Social Gatherings with Friends and Family: Not on file  . Attends Religious Services: Not on file  . Active Member of Clubs or Organizations: Not on file  . Attends Archivist Meetings: Not on file  . Marital Status: Not on file     Family History: The patient's family history includes Alzheimer's disease in her mother; Heart attack in her father.  ROS:   Please see the history of present illness.     All other systems reviewed and are negative.  EKGs/Labs/Other Studies Reviewed:    The following studies were reviewed today:  ECHO 01/12/20  1. Aortic valve is severely calcified with mild stenosis and moderate  insufficiency. There is severe dilated ascending aortic aneurysm with  maximum diameter 55 mm. A dedicated chest CTA or MRA and referral to CT  surgery is recommended.  2. Left ventricular ejection fraction, by estimation, is 55 to 60%. The  left ventricle has normal function. The  left ventricle has no regional  wall motion abnormalities. Left ventricular diastolic parameters are  consistent with Grade I diastolic  dysfunction (impaired relaxation). Elevated left ventricular end-diastolic  pressure.  3. Right ventricular systolic function is normal. The right ventricular  size is normal.  4. The mitral valve is normal in structure. No evidence of mitral valve  regurgitation. No evidence of mitral stenosis. Severe mitral annular  calcification.  5. The aortic valve is calcified. There is severe calcifcation of the  aortic valve. There is severe thickening of the aortic valve. Aortic valve  regurgitation is moderate. Mild aortic valve stenosis.  6. Aortic dilatation noted. Aneurysm of the ascending aorta, measuring 55  mm. There is severe dilatation of the ascending aorta, measuring 55 mm.  7. The inferior vena cava is dilated in size with <50% respiratory  variability, suggesting right atrial pressure of 15 mmHg.   EKG: Sinus rhythm left bundle branch block  Recent Labs: No results found for requested labs within last 8760 hours.  Recent Lipid Panel    Component Value Date/Time   CHOL 194 12/11/2017 1024   TRIG 53 12/11/2017 1024   HDL 59 12/11/2017 1024   LDLCALC 125 12/11/2017 1024  Physical Exam:    VS:  BP 130/60   Pulse 70   Ht 5\' 3"  (1.6 m)   Wt 115 lb (52.2 kg)   LMP  (LMP Unknown)   SpO2 90%   BMI 20.37 kg/m     Wt Readings from Last 3 Encounters:  01/14/20 115 lb (52.2 kg)  12/18/19 115 lb (52.2 kg)  10/08/19 112 lb (50.8 kg)     GEN:  Thin in no acute distress HEENT: Normal NECK: No JVD; No carotid bruits LYMPHATICS: No lymphadenopathy CARDIAC: RRR, diastolic murmur, rubs, gallops RESPIRATORY:  Clear to auscultation without rales, wheezing or rhonchi  ABDOMEN: Soft, non-tender, non-distended MUSCULOSKELETAL:  No edema; No deformity  SKIN: Warm and dry NEUROLOGIC:  Alert and oriented x 3 PSYCHIATRIC:  Normal affect    ASSESSMENT:    1. Pre-procedure lab exam   2. Aortic aneurysm without rupture, unspecified portion of aorta (HCC)    PLAN:    In order of problems listed above:  Ascending aortic aneurysm -55 mm on echocardiogram. -We will check a CTA of chest abdomen and pelvis to evaluate her aorta. -I will refer her to cardiothoracic surgery for surgical repair evaluation.  Discussed with Ryan.  Dr. Cyndia Bent will be seeing her on Thursday afternoon.  She will touch base with Dr. Cyndia Bent regarding her case. -She does have underlying aortic regurgitation which is moderate.  Aortic valve replacement would be done as well. -She will need a cardiac catheterization as well.  Timing will most likely be after Dr. Cyndia Bent sees her.  Family history of CAD with coronary artery calcification/aortic atherosclerosis -Statin was started at last visit Crestor 10 mg.  Moderate aortic regurgitation -As above.  Aortic valve replacement recommended.  45 minutes spent with patient, showing her echocardiogram, describing to family members, coordinating care with cardiothoracic surgery.  Medication Adjustments/Labs and Tests Ordered: Current medicines are reviewed at length with the patient today.  Concerns regarding medicines are outlined above.  Orders Placed This Encounter  Procedures  . CT ANGIO CHEST AORTA W/CM & OR WO/CM  . CT Angio Abd/Pel w/ and/or w/o  . Basic metabolic panel   No orders of the defined types were placed in this encounter.   Patient Instructions  Medication Instructions:  The current medical regimen is effective;  continue present plan and medications.  *If you need a refill on your cardiac medications before your next appointment, please call your pharmacy*  Lab Work: Please have blood work today (BMP)  If you have labs (blood work) drawn today and your tests are completely normal, you will receive your results only by: Marland Kitchen MyChart Message (if you have MyChart) OR . A paper copy in  the mail If you have any lab test that is abnormal or we need to change your treatment, we will call you to review the results.  Testing/Procedures: Non-Cardiac CT Angiography (CTA), is a special type of CT scan that uses a computer to produce multi-dimensional views of major blood vessels throughout the body. In CT angiography, a contrast material is injected through an IV to help visualize the blood vessels This will be a CT of your chest and abdomen.  This will be completed here at our office.  You have been referred to [facility] and will be called for the appointment. (Probably Tuesday 12/7)  Follow-Up: At [facility], you and your health needs are our priority.  As part of our continuing mission to provide you with exceptional heart care, we have created  designated Provider Care Teams.  These Care Teams include your primary Cardiologist (physician) and Advanced Practice Providers (APPs -  Physician Assistants and Nurse Practitioners) who all work together to provide you with the care you need, when you need it.  We recommend signing up for the patient portal called "MyChart".  Sign up information is provided on this After Visit Summary.  MyChart is used to connect with patients for Virtual Visits (Telemedicine).  Patients are able to view lab/test results, encounter notes, upcoming appointments, etc.  Non-urgent messages can be sent to your provider as well.   To learn more about what you can do with MyChart, go to [email or web address].    Your next appointment:   Follow up with Dr Marlou Porch to be determined.  Thank you for choosing [facility]!!        Signed, Candee Furbish, MD  01/14/2020 12:15 PM    [place]

## 2020-01-19 ENCOUNTER — Institutional Professional Consult (permissible substitution): Payer: Medicare Other | Admitting: Surgery

## 2020-01-19 ENCOUNTER — Other Ambulatory Visit: Payer: Self-pay

## 2020-01-19 ENCOUNTER — Other Ambulatory Visit: Payer: Medicare Other

## 2020-01-19 ENCOUNTER — Inpatient Hospital Stay: Admission: RE | Admit: 2020-01-19 | Payer: Medicare Other | Source: Ambulatory Visit

## 2020-01-19 VITALS — BP 150/85 | HR 87 | Temp 98.4°F | Resp 20 | Ht 63.0 in | Wt 115.0 lb

## 2020-01-19 DIAGNOSIS — I712 Thoracic aortic aneurysm, without rupture, unspecified: Secondary | ICD-10-CM

## 2020-01-20 ENCOUNTER — Encounter: Payer: Self-pay | Admitting: Surgery

## 2020-01-20 NOTE — Progress Notes (Signed)
Cardiothoracic Surgery Consultation  PCP is Josetta Huddle, MD Referring Provider is Jerline Pain, MD  Chief Complaint  Patient presents with  . Aortic Insuffiency    Surgical consult, ECHO 01/12/20, CTA C/A/P 01/14/20    HPI:  The patient is an almost 85 year old woman with a history of arthritis status post right total knee replacement in 2011 and cervical fusion in 2012, GERD, hypertension, thyroid goiter causing cough and hoarse, glaucoma, temporal arteritis, SVT, and mild cognitive impairment and RLS followed by neurology who was recently evaluated by Dr. Marlou Porch for atrial tachycardia.  She had a 2D echocardiogram which showed a normal left ventricular ejection fraction of 55 to 60% with grade 1 diastolic dysfunction.  The aortic valve was calcified and thickened with what was felt to be moderate aortic insufficiency with a pressure half-time of 264 ms.  There was mild aortic stenosis.  There was severe mitral annular calcification as well as calcification and thickening of the mitral valve leaflets with a mean mitral valve gradient of 3 mmHg.  There is no significant mitral regurgitation.  She was noted to have an ascending aortic diameter 5.5 cm with an aortic root diameter 3.7 cm.  She had a CTA of the chest, abdomen, and pelvis which showed an ascending aortic aneurysm with a maximum diameter of 5.5 cm with no evidence of aortic dissection.  Proximal aortic arch still measured 5 cm and the transverse arch between the left common carotid and left subclavian was 3 cm.  The proximal descending aorta was 3.1 cm.  There were some coronary calcifications suggesting coronary disease.  There was a multinodular goiter with a 1.5 cm ill-defined right thyroid nodule.  The patient is here today with her husband and daughter.  She lives at [facility] with her husband.  She denies any chest pain or pressure.  She has had no back pain.  She denies shortness of breath.  She denies exertional fatigue.   She denies any dizziness or syncope.  Her daughter and husband feel that her mild cognitive impairment is stable.   Past Medical History:  Diagnosis Date  . Arthritis    OA BOTH KNEES AND HANDS  . Atrial tachycardia (HCC)    HX OF  . GERD (gastroesophageal reflux disease)   . Glaucoma    left eye  . Goiter    CAUSING COUGH, HOARSINESS AND DRY THROAT  . Headache(784.0)    MIGRAINES   . Overactive bladder   . Temporal arteritis [facility])     Past Surgical History:  Procedure Laterality Date  . ARTERY BIOPSY Right 07/21/2012   Procedure: BIOPSY TEMPORAL ARTERY;  Surgeon: Haywood Lasso, MD;  Location: [facility];  Service: General;  Laterality: Right;  . CERVICAL FUSION  2012   Dr. Vertell Limber  . EYE SURGERY     CATARACT EXTRACTION- BILATERAL  . INCONTINENCE SURGERY    . JOINT REPLACEMENT     right knee  . LEFT SHOULDER SURGERY    . RIGHT KNEE ARTHROSCOPY  11/2009    . ROBOTIC ASSISTED BILATERAL SALPINGO OOPHERECTOMY Bilateral 12/08/2013   Procedure: ROBOTIC ASSISTED LAPAROSCOPIC BILATERAL SALPINGO OOPHORECTOMY WITH STAGING;  Surgeon: Everitt Amber, MD;  Location: [facility];  Service: Gynecology;  Laterality: Bilateral;  . TOTAL KNEE ARTHROPLASTY  07/04/2011   Procedure: TOTAL KNEE ARTHROPLASTY;  Surgeon: Gearlean Alf, MD;  Location: [facility];  Service: Orthopedics;  Laterality: Right;    Family History  Problem Relation Age of Onset  . Alzheimer's disease  Mother   . Heart attack Father     Social History Social History   Tobacco Use  . Smoking status: Never Smoker  . Smokeless tobacco: Never Used  Vaping Use  . Vaping Use: Never used  Substance Use Topics  . Alcohol use: Yes    Alcohol/week: 1.0 standard drink    Types: 1 Glasses of wine per week    Comment: SELDOM, very little  . Drug use: No    Current Outpatient Medications  Medication Sig Dispense Refill  . Biotin 5000 MCG CAPS Take 1 capsule by mouth daily.    Marland Kitchen BRIMONIDINE TARTRATE EX Apply topically.    . Calcium  Carbonate-Vitamin D (CALCIUM PLUS VITAMIN D PO) Take 1 tablet by mouth 2 (two) times daily.     . Coenzyme Q10 (CO Q 10) 100 MG CAPS Take 1 tablet by mouth at bedtime.     . ibandronate (BONIVA) 150 MG tablet Take 150 mg by mouth every 30 (thirty) days.     . Ibuprofen (ADVIL) 200 MG CAPS Take 400 mg by mouth daily as needed (pain.).    Marland Kitchen neomycin-polymyxin-hydrocortisone (CORTISPORIN) OTIC solution 1-2 drops to toe twice daily after soaking 10 mL 0  . Omega-3 Fatty Acids (FISH OIL) 1200 MG CAPS Take 1 capsule by mouth daily.     . Probiotic Product (PROBIOTIC PO) Take 1 capsule by mouth at bedtime. Ultra Flora Balance.    . rivastigmine (EXELON) 1.5 MG capsule TAKE 1 CAPSULE EACH MORNING. 30 capsule 11  . rivastigmine (EXELON) 3 MG capsule Take 1 capsule (3 mg total) by mouth at bedtime. 30 capsule 11  . rosuvastatin (CRESTOR) 10 MG tablet Take 1 tablet (10 mg total) by mouth daily. 90 tablet 3  . cholecalciferol (VITAMIN D3) 25 MCG (1000 UT) tablet Take 1,000 Units by mouth daily. (Patient not taking: Reported on 01/19/2020)    . Magnesium 200 MG TABS Take 1 tablet by mouth 2 (two) times daily. (Patient not taking: Reported on 01/19/2020)    . Rotigotine (NEUPRO) 1 MG/24HR PT24 Place 1 patch (1 mg total) onto the skin at bedtime. (Patient not taking: Reported on 01/19/2020) 30 patch 11   No current facility-administered medications for this visit.    Allergies  Allergen Reactions  . Oxycodone Anaphylaxis  . Mold Extract [Trichophyton]     headache migraine  . Codeine Nausea Only    Review of Systems  Constitutional: Negative for activity change and fatigue.  HENT: Negative.   Eyes:       Blurry vision and floaters  Respiratory: Negative for chest tightness and shortness of breath.   Cardiovascular: Negative for chest pain.  Genitourinary: Negative.   Musculoskeletal: Negative.   Skin: Negative.   Allergic/Immunologic: Negative.   Neurological: Negative.        Memory problems   Hematological: Negative.     BP (!) 150/85   Pulse 87   Temp 98.4 F (36.9 C) (Skin)   Resp 20   Ht 5\' 3"  (1.6 m)   Wt 115 lb (52.2 kg)   LMP  (LMP Unknown)   SpO2 96% Comment: RA  BMI 20.37 kg/m  Physical Exam Constitutional:      Appearance: She is normal weight.  HENT:     Head: Normocephalic and atraumatic.  Eyes:     Extraocular Movements: Extraocular movements intact.  Cardiovascular:     Rate and Rhythm: Normal rate and regular rhythm.     Pulses: Normal pulses.  Heart sounds: Normal heart sounds. No murmur heard.   Pulmonary:     Effort: Pulmonary effort is normal.     Breath sounds: Normal breath sounds.  Musculoskeletal:        General: No swelling.  Skin:    General: Skin is dry.  Neurological:     General: No focal deficit present.     Mental Status: She is alert and oriented to person, place, and time.  Psychiatric:        Mood and Affect: Mood normal.        Behavior: Behavior normal.      Diagnostic Tests:   ECHOCARDIOGRAM REPORT       Patient Name:  Janet Wilson Date of Exam: 01/12/2020  Medical Rec #: [id number]          Height:    63.0 in  Accession #:  [id number]         Weight:    115.0 lb  Date of Birth: September 25, 1934          BSA:     1.528 m  Patient Age:  79 years          BP:      118/62 mmHg  Patient Gender: F              HR:      86 bpm.  Exam Location: [facility] [place]   Procedure: 2D Echo, Cardiac Doppler and Color Doppler   Indications:  I35.1 Aortic Insufficiency    History:    Patient has no prior history of Echocardiogram  examinations.         Arrythmias:LBBB; Risk Factors:Family History of Coronary  Artery         Disease and Hypertension. Tachycardia.    Sonographer:  Deliah Boston RDCS  Referring Phys: [place]    1. Aortic valve is severely calcified with  mild stenosis and moderate  insufficiency. There is severe dilated ascending aortic aneurysm with  maximum diameter 55 mm. A dedicated chest CTA or MRA and referral to CT  surgery is recommended.  2. Left ventricular ejection fraction, by estimation, is 55 to 60%. The  left ventricle has normal function. The left ventricle has no regional  wall motion abnormalities. Left ventricular diastolic parameters are  consistent with Grade I diastolic  dysfunction (impaired relaxation). Elevated left ventricular end-diastolic  pressure.  3. Right ventricular systolic function is normal. The right ventricular  size is normal.  4. The mitral valve is normal in structure. No evidence of mitral valve  regurgitation. No evidence of mitral stenosis. Severe mitral annular  calcification.  5. The aortic valve is calcified. There is severe calcifcation of the  aortic valve. There is severe thickening of the aortic valve. Aortic valve  regurgitation is moderate. Mild aortic valve stenosis.  6. Aortic dilatation noted. Aneurysm of the ascending aorta, measuring 55  mm. There is severe dilatation of the ascending aorta, measuring 55 mm.  7. The inferior vena cava is dilated in size with <50% respiratory  variability, suggesting right atrial pressure of 15 mmHg.   FINDINGS  Left Ventricle: Left ventricular ejection fraction, by estimation, is 55  to 60%. The left ventricle has normal function. The left ventricle has no  regional wall motion abnormalities. The left ventricular internal cavity  size was normal in size. There is  no left ventricular hypertrophy. Left ventricular diastolic parameters  are consistent with Grade I diastolic dysfunction (impaired  relaxation).  Elevated left ventricular end-diastolic pressure.   Right Ventricle: The right ventricular size is normal. No increase in  right ventricular wall thickness. Right ventricular systolic function is  normal.   Left Atrium: Left  atrial size was normal in size.   Right Atrium: Right atrial size was normal in size.   Pericardium: There is no evidence of pericardial effusion.   Mitral Valve: The mitral valve is normal in structure. There is severe  thickening of the mitral valve leaflet(s). There is severe calcification  of the mitral valve leaflet(s). Severe mitral annular calcification. No  evidence of mitral valve  regurgitation. No evidence of mitral valve stenosis. MV peak gradient, 8.1  mmHg. The mean mitral valve gradient is 3.0 mmHg.   Tricuspid Valve: The tricuspid valve is normal in structure. Tricuspid  valve regurgitation is not demonstrated. No evidence of tricuspid  stenosis.   Aortic Valve: The aortic valve is calcified. There is severe calcifcation  of the aortic valve. There is severe thickening of the aortic valve.  Aortic valve regurgitation is moderate. Aortic regurgitation PHT measures  264 msec. Mild aortic stenosis is  present.   Pulmonic Valve: The pulmonic valve was normal in structure. Pulmonic valve  regurgitation is not visualized. No evidence of pulmonic stenosis.   Aorta: Aortic dilatation noted. There is severe dilatation of the  ascending aorta, measuring 55 mm. There is an aneurysm involving the  ascending aorta measuring 55 mm.   Venous: The inferior vena cava is dilated in size with less than 50%  respiratory variability, suggesting right atrial pressure of 15 mmHg.   IAS/Shunts: No atrial level shunt detected by color flow Doppler.     LEFT VENTRICLE  PLAX 2D  LVIDd:     4.60 cm Diastology  LVIDs:     3.20 cm LV e' medial:  3.81 cm/s  LV PW:     0.70 cm LV E/e' medial: 23.4  LV IVS:    0.60 cm LV e' lateral:  8.58 cm/s  LVOT diam:   2.60 cm LV E/e' lateral: 10.4  LV SV:     113  LV SV Index:  74  LVOT Area:   5.31 cm     RIGHT VENTRICLE  RV S prime:   11.20 cm/s  TAPSE (M-mode): 1.8 cm   LEFT ATRIUM       Index     RIGHT ATRIUM      Index  LA diam:    3.90 cm 2.55 cm/m RA Area:   17.10 cm  LA Vol (A2C):  60.3 ml 39.45 ml/m RA Volume:  46.10 ml 30.16 ml/m  LA Vol (A4C):  50.2 ml 32.84 ml/m  LA Biplane Vol: 54.7 ml 35.79 ml/m  AORTIC VALVE  LVOT Vmax:  96.80 cm/s  LVOT Vmean: 64.700 cm/s  LVOT VTI:  0.213 m  AI PHT:   264 msec    AORTA  Ao Root diam: 3.70 cm  Ao Asc diam: 5.50 cm   MITRAL VALVE  MV Area (PHT): 4.68 cm   SHUNTS  MV Peak grad: 8.1 mmHg   Systemic VTI: 0.21 m  MV Mean grad: 3.0 mmHg   Systemic Diam: 2.60 cm  MV Vmax:    1.42 m/s  MV Vmean:   78.3 cm/s  MV Decel Time: 183 msec  MV E velocity: 89.20 cm/s  MV A velocity: 121.00 cm/s  MV E/A ratio: 0.74   Ena Dawley MD  Electronically signed by Ena Dawley MD  Signature Date/Time: 01/12/2020/9:03:14 PM    Narrative & Impression  CLINICAL DATA:  Ascending thoracic aortic aneurysm.  EXAM: CT ANGIOGRAPHY CHEST, ABDOMEN AND PELVIS  TECHNIQUE: Non-contrast CT of the chest was initially obtained.  Multidetector CT imaging through the chest, abdomen and pelvis was performed using the standard protocol during bolus administration of intravenous contrast. Multiplanar reconstructed images and MIPs were obtained and reviewed to evaluate the vascular anatomy.  CONTRAST:  128mL OMNIPAQUE IOHEXOL 350 MG/ML SOLN  COMPARISON:  August 10, 2011.  FINDINGS: CTA CHEST FINDINGS  Cardiovascular: 5.5 cm ascending thoracic aortic aneurysm is noted. No dissection is noted. Atherosclerosis of thoracic aorta is noted. Great vessels are widely patent. Transverse aortic arch measures 3 cm. Proximal descending thoracic aorta measures 3.1 cm. Mild cardiomegaly is noted. No pericardial effusion is noted. Coronary artery calcifications are noted.  Mediastinum/Nodes: 1.5 cm ill-defined right thyroid nodule is noted. Esophagus is unremarkable. No adenopathy is  noted.  Lungs/Pleura: Lungs are clear. No pleural effusion or pneumothorax.  Musculoskeletal: No chest wall abnormality. No acute or significant osseous findings.  Review of the MIP images confirms the above findings.  CTA ABDOMEN AND PELVIS FINDINGS  VASCULAR  Aorta: Atherosclerosis of abdominal aorta is noted without aneurysm or dissection.  Celiac: Patent without evidence of aneurysm, dissection, vasculitis or significant stenosis.  SMA: Patent without evidence of aneurysm, dissection, vasculitis or significant stenosis.  Renals: Both renal arteries are patent without evidence of aneurysm, dissection, vasculitis, fibromuscular dysplasia or significant stenosis.  IMA: Patent without evidence of aneurysm, dissection, vasculitis or significant stenosis.  Inflow: Patent without evidence of aneurysm, dissection, vasculitis or significant stenosis.  Veins: No obvious venous abnormality within the limitations of this arterial phase study.  Review of the MIP images confirms the above findings.  NON-VASCULAR  Hepatobiliary: No focal liver abnormality is seen. No gallstones, gallbladder wall thickening, or biliary dilatation.  Pancreas: Unremarkable. No pancreatic ductal dilatation or surrounding inflammatory changes.  Spleen: Normal in size without focal abnormality.  Adrenals/Urinary Tract: Adrenal glands appear normal. Mild bilateral hydronephrosis is noted without evidence of obstructing calculus. No renal or ureteral calculi are noted. Urinary bladder is unremarkable.  Stomach/Bowel: Stomach is within normal limits. Appendix appears normal. No evidence of bowel wall thickening, distention, or inflammatory changes.  Lymphatic: No significant adenopathy is noted.  Reproductive: 3 cm uterine fibroid is noted. No adnexal abnormality is noted.  Other: No abdominal wall hernia or abnormality. No abdominopelvic ascites.  Musculoskeletal:  No acute or significant osseous findings.  Review of the MIP images confirms the above findings.  IMPRESSION: 1. 5.5 cm ascending thoracic aortic aneurysm is noted. No dissection is noted. Recommend semi-annual imaging followup by CTA or MRA and referral to cardiothoracic surgery if not already obtained. This recommendation follows 2010 ACCF/AHA/AATS/ACR/ASA/SCA/SCAI/SIR/STS/SVM Guidelines for the Diagnosis and Management of Patients With Thoracic Aortic Disease. Circulation. 2010; 121: [id number]. Aortic aneurysm NOS (ICD10-I71.9) 2. Coronary artery calcifications are noted suggesting coronary artery disease. 3. Mild bilateral hydronephrosis is noted without evidence of obstructing calculus. 4. 3 cm uterine fibroid is noted.  Aortic Atherosclerosis (ICD10-I70.0).   Electronically Signed   By: Marijo Conception M.D.   On: 01/14/2020 17:15      Impression:  This 85 year old woman has a 5.5 cm fusiform ascending aortic aneurysm that extends from the level of the aortic root up to the mid aortic arch.  Echocardiogram shows moderate to severe calcification of the aortic valve leaflets and it may be a functionally bicuspid valve.  The  mean gradient was 14 mmHg consistent with mild aortic stenosis and there appeared to be moderate insufficiency with a pressure half-time of 268 ms.  Left ventricular ejection fraction was 55 to 60% with grade 1 diastolic dysfunction.  Her ascending aortic aneurysm is at the surgical threshold of 5.5 cm.  It is unclear how long it has been in size.  She did have a CTA of the chest in 2013 to rule out pulmonary embolism and there was minimal contrast in aorta but the diameter appeared to be 4 cm at the mid ascending aortic level.  Unfortunately her aneurysm extends up into the aortic arch and surgical repair would require a Bentall procedure in addition to replacement of her ascending aorta and aortic arch with bypasses to the innominate and left common  carotid artery.  This would require a long cardiopulmonary bypass run and a significant period of circulatory arrest to reconstruct the aortic arch which would significantly increase the risk of worsening neurocognitive dysfunction in this almost 85 year old patient who has been having some problems with neurocognitive dysfunction for several years.  This would certainly increase the risk that she may not continue to be independent at home.  Her risk of aortic dissection with a 5.5 cm aorta is probably in the range of 6 to 8 %/year but I think her operative risk is probably higher.  I think she would get through the surgery but may have some worsening of her neurocognitive function and a slow recovery postoperatively.  Her aortic valve has significant calcification but only mild stenosis and moderate insufficiency.  She remains asymptomatic.  I think it is reasonable in this patient to continue close observation for now with good blood pressure control and a follow-up CTA of the chest in 6 months.  If there is any further increase in the size of her aneurysm or she develops symptoms related to progressive aortic valve disease then we could discuss surgery further.  I reviewed the CTA images and echo images with the patient and her husband and daughter.  I discussed the pros and cons of surgery as noted above and they are in agreement with continued close follow-up or now.  I discussed the signs and symptoms of acute aortic dissection and the importance of continued good blood pressure control and preventing further enlargement of her aneurysm and acute aortic dissection.   Plan:  I will plan to see her back in 6 months with a CTA of the chest.   I spent 60 minutes performing this consultation and > 50% of this time was spent face to face counseling and coordinating the care of this patient's ascending aortic aneurysm.   Gaye Pollack, MD [facility] [phone]

## 2020-02-02 ENCOUNTER — Other Ambulatory Visit: Payer: Medicare Other

## 2020-02-02 ENCOUNTER — Inpatient Hospital Stay: Admission: RE | Admit: 2020-02-02 | Payer: Medicare Other | Source: Ambulatory Visit

## 2020-02-04 ENCOUNTER — Telehealth: Payer: Self-pay | Admitting: Cardiology

## 2020-02-04 DIAGNOSIS — I719 Aortic aneurysm of unspecified site, without rupture: Secondary | ICD-10-CM

## 2020-02-04 DIAGNOSIS — I351 Nonrheumatic aortic (valve) insufficiency: Secondary | ICD-10-CM

## 2020-02-04 NOTE — Telephone Encounter (Signed)
Patients husband called and wants to know if Dr. Marlou Porch could send over a referral to a Dr. In [facility]. Please call to discuss

## 2020-02-04 NOTE — Telephone Encounter (Signed)
Spoke with pt's husband [name]) who is requesting a referral to Dr Mali Hughes [place], [place] [phone] for a 2nd opinion. Per husband they understood from Dr Cyndia Bent if pt "did nothing" she could live another 5 or 6 years or if she has surgery, she would probably "end up a vegetable"   According to Dr Vivi Martens note: Unfortunately her aneurysm extends up into the aortic arch and surgical repair would require a Bentall procedure in addition to replacement of her ascending aorta and aortic arch with bypasses to the innominate and left common carotid artery.  This would require a long cardiopulmonary bypass run and a significant period of circulatory arrest to reconstruct the aortic arch which would significantly increase the risk of worsening neurocognitive dysfunction in this almost 85 year old patient who has been having some problems with neurocognitive dysfunction for several years.  This would certainly increase the risk that she may not continue to be independent at home.  Her risk of aortic dissection with a 5.5 cm aorta is probably in the range of 6 to 8 %/year but I think her operative risk is probably higher.  I think she would get through the surgery but may have some worsening of her neurocognitive function and a slow recovery postoperatively.  Husband states they tried calling Dr Ysidro Evert office themselves for an appt but were instructed pt would have to be referred by her Dr.  [name] is aware I will have Dr Marlou Porch to review and make him aware once this has occurred.

## 2020-02-08 NOTE — Telephone Encounter (Signed)
Okay to refer to [place], Dr. Ysidro Evert for second opinion.  We discussed at length in clinic.  Daughter present during discussion. Candee Furbish, MD

## 2020-02-15 ENCOUNTER — Telehealth: Payer: Self-pay | Admitting: Cardiology

## 2020-02-15 NOTE — Telephone Encounter (Signed)
Misty Stanley, daughter of the patient called. Daughter needs [place] to send the patient's records (including copies of all imaging) to Dr. [place] Hughes at [facility]. Office can be reached by phone at [phone]. Records can be faxed to [phone].  Misty Stanley states the patient can not schedule an appointment until [facility] gets her records

## 2020-02-22 NOTE — Telephone Encounter (Signed)
Medical records faxed Friday per request.

## 2020-03-04 ENCOUNTER — Other Ambulatory Visit: Payer: Self-pay

## 2020-03-04 ENCOUNTER — Encounter (HOSPITAL_COMMUNITY): Payer: Self-pay | Admitting: Emergency Medicine

## 2020-03-04 ENCOUNTER — Emergency Department (HOSPITAL_COMMUNITY): Payer: Medicare Other

## 2020-03-04 ENCOUNTER — Telehealth: Payer: Self-pay

## 2020-03-04 ENCOUNTER — Emergency Department (HOSPITAL_COMMUNITY)
Admission: EM | Admit: 2020-03-04 | Discharge: 2020-03-04 | Disposition: A | Discharge status: Home / Self Care | Payer: Medicare Other | Attending: Emergency Medicine | Admitting: Emergency Medicine

## 2020-03-04 DIAGNOSIS — R0789 Other chest pain: Secondary | ICD-10-CM | POA: Diagnosis not present

## 2020-03-04 DIAGNOSIS — I712 Thoracic aortic aneurysm, without rupture, unspecified: Secondary | ICD-10-CM

## 2020-03-04 DIAGNOSIS — R072 Precordial pain: Secondary | ICD-10-CM | POA: Insufficient documentation

## 2020-03-04 DIAGNOSIS — Z96651 Presence of right artificial knee joint: Secondary | ICD-10-CM | POA: Diagnosis not present

## 2020-03-04 DIAGNOSIS — R079 Chest pain, unspecified: Secondary | ICD-10-CM | POA: Diagnosis present

## 2020-03-04 LAB — HEPATIC FUNCTION PANEL
ALT: 19 U/L (ref 0–44)
AST: 24 U/L (ref 15–41)
Albumin: 3.8 g/dL (ref 3.5–5.0)
Alkaline Phosphatase: 63 U/L (ref 38–126)
Bilirubin, Direct: 0.2 mg/dL (ref 0.0–0.2)
Indirect Bilirubin: 0.7 mg/dL (ref 0.3–0.9)
Total Bilirubin: 0.9 mg/dL (ref 0.3–1.2)
Total Protein: 6.8 g/dL (ref 6.5–8.1)

## 2020-03-04 LAB — TROPONIN I (HIGH SENSITIVITY)
Troponin I (High Sensitivity): 5 ng/L (ref <18)
Troponin I (High Sensitivity): 6 ng/L (ref <18)

## 2020-03-04 LAB — CBC
HCT: 45.8 % (ref 36.0–46.0)
Hemoglobin: 14.3 g/dL (ref 12.0–15.0)
MCH: 30.8 pg (ref 26.0–34.0)
MCHC: 31.2 g/dL (ref 30.0–36.0)
MCV: 98.5 fL (ref 80.0–100.0)
Platelets: 172 10*3/uL (ref 150–400)
RBC: 4.65 MIL/uL (ref 3.87–5.11)
RDW: 12.9 % (ref 11.5–15.5)
WBC: 5.3 10*3/uL (ref 4.0–10.5)
nRBC: 0 % (ref 0.0–0.2)

## 2020-03-04 LAB — BASIC METABOLIC PANEL
Anion gap: 10 (ref 5–15)
BUN: 21 mg/dL (ref 8–23)
CO2: 24 mmol/L (ref 22–32)
Calcium: 9.2 mg/dL (ref 8.9–10.3)
Chloride: 105 mmol/L (ref 98–111)
Creatinine, Ser: 0.75 mg/dL (ref 0.44–1.00)
GFR, Estimated: >60 mL/min (ref >60)
Glucose, Bld: 84 mg/dL (ref 70–99)
Potassium: 4.2 mmol/L (ref 3.5–5.1)
Sodium: 139 mmol/L (ref 135–145)

## 2020-03-04 LAB — LIPASE, BLOOD: Lipase: 36 U/L (ref 11–51)

## 2020-03-04 MED ORDER — OMEPRAZOLE 20 MG PO CPDR: 20.0000 mg | DELAYED_RELEASE_CAPSULE | Freq: Every day | ORAL | 0 refills | Status: DC

## 2020-03-04 MED ORDER — IOHEXOL 350 MG/ML SOLN
70.0000 mL | Freq: Once | INTRAVENOUS | Status: AC | PRN
  Administered 2020-03-04: 70 mL via INTRAVENOUS

## 2020-03-04 NOTE — ED Triage Notes (Signed)
Pt brought to ED by EMS from home for c/o right cp radiating to back, relieved by Advil that pt took prior to ems arrival to her house, no medications given by ems pta to ed. bp 134/83, hr 84, r 19, spo2 97%RA.

## 2020-03-04 NOTE — ED Provider Notes (Signed)
[facility] EMERGENCY DEPARTMENT Provider Note   CSN: [id number] Arrival date & time: 03/04/20  1148     History Chief Complaint  Patient presents with  . Chest Pain    Janet Wilson is a 86 y.o. female.  HPI Patient has a known ascending aortic aneurysm 5.5 cm under observation.  She reports sometime between 2 and 4 in the morning, she got up to go to the bathroom.  She reports she had a uncomfortable and sharp pain in her chest that went through to her back.  No associated nausea or vomiting, no associated shortness of breath or syncope.  Patient reports she took a Advil tablet and within about 2 hours the pain got better.  It has not recurred.  She called her cardiologist office and was recommended to come to the emergency department for evaluation.  No lower extremity swelling or calf pain.  No history of PE.    Past Medical History:  Diagnosis Date  . Arthritis    OA BOTH KNEES AND HANDS  . Atrial tachycardia (HCC)    HX OF  . GERD (gastroesophageal reflux disease)   . Glaucoma    left eye  . Goiter    CAUSING COUGH, HOARSINESS AND DRY THROAT  . Headache(784.0)    MIGRAINES   . Overactive bladder   . Temporal arteritis [facility])     Patient Active Problem List   Diagnosis Date Noted  . Coronary artery calcification 12/18/2019  . Aortic atherosclerosis ([place]) 12/18/2019  . Pure hypercholesterolemia 12/18/2019  . Senile osteopenia 03/20/2018  . H/O temporal arteritis 03/20/2018  . Memory loss, short term 03/20/2018  . Abnormal weight loss 03/20/2018  . Nausea 03/20/2018  . Mixed urge and stress incontinence 03/20/2018  . Multinodular goiter (nontoxic) 03/20/2018  . RLS (restless legs syndrome) 12/19/2017  . Pain in right knee 10/09/2017  . Ectopic atrial tachycardia ([place]) 10/08/2016  . Cough 09/06/2015  . Hoarseness of voice 09/06/2015  . Rhinitis, chronic 09/06/2015  . Bilateral occipital neuralgia 06/16/2015  . Convergence  insufficiency 08/09/2014  . Neck pain 03/05/2013  . Capsular glaucoma with pseudoexfoliation of lens, left eye, moderate stage 12/22/2012  . Right temporal headache 06/30/2012  . Family history of coronary artery disease 09/24/2011  . S/P TKR (total knee replacement) 09/24/2011  . Tachycardia 08/10/2011  . OA (osteoarthritis) of knee 07/04/2011    Past Surgical History:  Procedure Laterality Date  . ARTERY BIOPSY Right 07/21/2012   Procedure: BIOPSY TEMPORAL ARTERY;  Surgeon: Haywood Lasso, MD;  Location: [place];  Service: General;  Laterality: Right;  . CERVICAL FUSION  2012   Dr. Vertell Limber  . EYE SURGERY     CATARACT EXTRACTION- BILATERAL  . INCONTINENCE SURGERY    . JOINT REPLACEMENT     right knee  . LEFT SHOULDER SURGERY    . RIGHT KNEE ARTHROSCOPY  11/2009    . ROBOTIC ASSISTED BILATERAL SALPINGO OOPHERECTOMY Bilateral 12/08/2013   Procedure: ROBOTIC ASSISTED LAPAROSCOPIC BILATERAL SALPINGO OOPHORECTOMY WITH STAGING;  Surgeon: Everitt Amber, MD;  Location: [facility];  Service: Gynecology;  Laterality: Bilateral;  . TOTAL KNEE ARTHROPLASTY  07/04/2011   Procedure: TOTAL KNEE ARTHROPLASTY;  Surgeon: Gearlean Alf, MD;  Location: [facility];  Service: Orthopedics;  Laterality: Right;     OB History   No obstetric history on file.     Family History  Problem Relation Age of Onset  . Alzheimer's disease Mother   . Heart attack Father  Social History   Tobacco Use  . Smoking status: Never Smoker  . Smokeless tobacco: Never Used  Vaping Use  . Vaping Use: Never used  Substance Use Topics  . Alcohol use: Yes    Alcohol/week: 1.0 standard drink    Types: 1 Glasses of wine per week    Comment: SELDOM, very little  . Drug use: No    Home Medications Prior to Admission medications   Medication Sig Start Date End Date Taking? Authorizing Provider  Coenzyme Q10 (CO Q 10) 100 MG CAPS Take 1 tablet by mouth at bedtime.    Yes [provider]  Ibuprofen 200 MG CAPS  Take 400 mg by mouth daily as needed (pain.).   Yes [provider]  neomycin-polymyxin-hydrocortisone (CORTISPORIN) OTIC solution 1-2 drops to toe twice daily after soaking Patient taking differently: 1 drop in the morning and at bedtime. 1-2 drops to toe twice daily after soaking 04/09/19  Yes Regal, Tamala Fothergill, DPM  Omega-3 Fatty Acids (FISH OIL) 1200 MG CAPS Take 1 capsule by mouth daily.    Yes [provider]  omeprazole (PRILOSEC) 20 MG capsule Take 1 capsule (20 mg total) by mouth daily. 03/04/20  Yes Chavez Rosol, Jeannie Done, MD  rivastigmine (EXELON) 1.5 MG capsule TAKE 1 CAPSULE EACH MORNING. 10/08/19  Yes McCue, Janett Billow, NP  rivastigmine (EXELON) 3 MG capsule Take 1 capsule (3 mg total) by mouth at bedtime. 10/08/19  Yes McCue, Janett Billow, NP  rosuvastatin (CRESTOR) 10 MG tablet Take 1 tablet (10 mg total) by mouth daily. 12/18/19 03/17/20 Yes Jerline Pain, MD  Biotin 5000 MCG CAPS Take 1 capsule by mouth daily. Patient not taking: No sig reported    [provider]  BRIMONIDINE TARTRATE EX Apply topically.    [provider]  Calcium Carbonate-Vitamin D (CALCIUM PLUS VITAMIN D PO) Take 1 tablet by mouth daily.    [provider]  cholecalciferol (VITAMIN D3) 25 MCG (1000 UT) tablet Take 1,000 Units by mouth daily. Patient not taking: No sig reported    [provider]  ibandronate (BONIVA) 150 MG tablet Take 150 mg by mouth every 30 (thirty) days.  04/05/19   [provider]  Magnesium 200 MG TABS Take 1 tablet by mouth 2 (two) times daily.    [provider]  Probiotic Product (PROBIOTIC PO) Take 1 capsule by mouth at bedtime. Ultra Flora Balance. Patient not taking: No sig reported    [provider]  Rotigotine (NEUPRO) 1 MG/24HR PT24 Place 1 patch (1 mg total) onto the skin at bedtime. Patient not taking: No sig reported 10/08/19   Frann Rider, NP    Allergies    Oxycodone, Mold extract [trichophyton], and  Codeine  Review of Systems   Review of Systems 10 Systems reviewed and negative except as per HPI Physical Exam Updated Vital Signs BP (!) 146/65   Pulse 79   Temp 98.3 F (36.8 C) (Oral)   Resp 17   Ht 5\' 3"  (1.6 m)   Wt 52.2 kg   LMP  (LMP Unknown)   SpO2 100%   BMI 20.37 kg/m   Physical Exam Constitutional:      Appearance: She is well-developed and well-nourished.  HENT:     Head: Normocephalic and atraumatic.  Eyes:     Extraocular Movements: EOM normal.     Pupils: Pupils are equal, round, and reactive to light.  Cardiovascular:     Rate and Rhythm: Normal rate and regular rhythm.  Pulses: Intact distal pulses.     Heart sounds: Normal heart sounds.  Pulmonary:     Effort: Pulmonary effort is normal.     Breath sounds: Normal breath sounds.  Abdominal:     General: Bowel sounds are normal. There is no distension.     Palpations: Abdomen is soft.     Tenderness: There is no abdominal tenderness.  Musculoskeletal:        General: No edema. Normal range of motion.     Cervical back: Neck supple.  Skin:    General: Skin is warm, dry and intact.  Neurological:     Mental Status: She is alert and oriented to person, place, and time.     GCS: GCS eye subscore is 4. GCS verbal subscore is 5. GCS motor subscore is 6.     Coordination: Coordination normal.     Deep Tendon Reflexes: Strength normal.  Psychiatric:        Mood and Affect: Mood and affect normal.     ED Results / Procedures / Treatments   Labs (all labs ordered are listed, but only abnormal results are displayed) Labs Reviewed  BASIC METABOLIC PANEL  CBC  LIPASE, BLOOD  HEPATIC FUNCTION PANEL  TROPONIN I (HIGH SENSITIVITY)  TROPONIN I (HIGH SENSITIVITY)    EKG EKG Interpretation  Date/Time:  Friday March 04 2020 11:53:26 EST Ventricular Rate:  79 PR Interval:    QRS Duration: 144 QT Interval:  409 QTC Calculation: 469 R Axis:   -67 Text Interpretation: Sinus rhythm Left bundle  branch block LBBB new since available comparison from 2014 Confirmed by Charlesetta Shanks [phone]) on 03/04/2020 12:28:58 PM   Radiology CT ANGIO CHEST AORTA W/CM & OR WO/CM  Result Date: 03/04/2020 CLINICAL DATA:  Known thoracic aortic aneurysm, radiating right chest pain to the back. EXAM: CT ANGIOGRAPHY CHEST WITH CONTRAST TECHNIQUE: Multidetector CT imaging of the chest was performed using the standard protocol during bolus administration of intravenous contrast. Multiplanar CT image reconstructions and MIPs were obtained to evaluate the vascular anatomy. CONTRAST:  43mL OMNIPAQUE IOHEXOL 350 MG/ML SOLN COMPARISON:  01/14/2020 FINDINGS: Cardiovascular: Stable 5.5 cm fusiform aneurysmal dilatation of the ascending thoracic aorta. Similar atherosclerotic changes most pronounced of the transverse aorta. Major branch vessels are tortuous but remain patent. No acute dissection. No evidence of rupture, mediastinal hemorrhage or hematoma. Central pulmonary arteries are patent. No large filling defect or pulmonary embolus. Mild cardiac enlargement. Native coronary atherosclerosis. No pericardial effusion. Mediastinum/Nodes: Stable heterogeneous enhancement and nodularity of the thyroid compatible with goiter. Trachea and central airways are patent. Esophagus nondilated. No hiatal hernia. No adenopathy. Lungs/Pleura: Punctate subpleural calcified granuloma in the right upper lobe as before. Minimal dependent basilar atelectasis/hypoventilatory changes. No acute pneumonia, collapse or consolidation. Negative for edema or interstitial process. No pleural abnormality, effusion or pneumothorax. Upper Abdomen: Tortuous atherosclerotic aorta. Mesenteric vasculature appear patent. No acute upper abdominal finding. Musculoskeletal: Remote lower cervical fusion noted. No acute osseous finding. Thoracic spondylosis present. Intact sternum. Review of the MIP images confirms the above findings. IMPRESSION: Stable 5.5 cm ascending  thoracic aortic aneurysm. No interval change or acute vascular finding in the chest by CTA. Recommend semi-annual imaging followup by CTA or MRA and referral to cardiothoracic surgery if not already obtained. This recommendation follows 2010 ACCF/AHA/AATS/ACR/ASA/SCA/SCAI/SIR/STS/SVM Guidelines for the Diagnosis and Management of Patients With Thoracic Aortic Disease. Circulation. 2010; [id number]. Aortic aneurysm NOS (ICD10-I71.9) No other acute intrathoracic finding. Aortic Atherosclerosis (ICD10-I70.0). Electronically Signed   By: Jerilynn Mages.  Shick M.D.   On: 03/04/2020 15:11    Procedures Procedures (including critical care time)  Medications Ordered in ED Medications  iohexol (OMNIPAQUE) 350 MG/ML injection 70 mL (70 mLs Intravenous Contrast Given 03/04/20 1437)    ED Course  I have reviewed the triage vital signs and the nursing notes.  Pertinent labs & imaging results that were available during my care of the patient were reviewed by me and considered in my medical decision making (see chart for details).    MDM Rules/Calculators/A&P                          Patient developed chest pain that was central and left and radiating into her right shoulder had about 2 AM this morning.  Pain had resolved earlier this morning.  He has not had recurrence of pain.  Certain was for possible expansion of unknown thoracic aortic aneurysm.  CT scan shows this to be stable.  Patient's blood pressures are well controlled.  Troponins are negative and EKG does not show acute ischemic change at this time, acute MI ruled out.  Patient is encouraged to follow-up with her cardiologist to determine if further outpatient diagnostic testing needed for ACS risk.  Strict return precautions reviewed.  Recommendations to try 2 weeks of omeprazole.   Final Clinical Impression(s) / ED Diagnoses Final diagnoses:  Precordial pain  Thoracic aortic aneurysm without rupture ([place])    Rx / DC Orders ED Discharge Orders          Ordered    omeprazole (PRILOSEC) 20 MG capsule  Daily        03/04/20 1525           Charlesetta Shanks, MD 03/04/20 [phone]

## 2020-03-04 NOTE — ED Notes (Signed)
Patient transported to CT 

## 2020-03-04 NOTE — Telephone Encounter (Signed)
Patient's daughter contacted the office with concerns about her mother who stated she was having chest and back pain with high blood pressures.  She currently lives at [facility] where a nurse was in the room and stated that her blood pressure when she took it was 130/70.  But did state that the chest pain started last night/ early morning.  She saw Dr. Cyndia Bent 01/19/20 for a 5.5cm aortic aneurysm.  Surgery was not scheduled at that time and she was to follow-up in 6 months unless she started to become symptomatic.  Due to patient being symptomatic, patient's daughter advised to have her taken to the ED.  She acknowledged receipt.

## 2020-03-04 NOTE — Discharge Instructions (Addendum)
1.  CT scan shows here thoracic aneurysm to be stable at this time.  There is been no enlargement 2.  Heart labs are not elevated to suggest heart attack today.  However, it is important that you follow-up with your heart doctor for further evaluation.  You may need further diagnostic testing. 3.  Return to the emergency department if you get recurrence of significant chest pain, shortness of breath, light headedness or other concerning symptoms. 4.  Take omeprazole once daily for the next 2 weeks.  Sometimes chest pain is caused by reflux.

## 2020-03-07 ENCOUNTER — Telehealth: Payer: Self-pay | Admitting: *Deleted

## 2020-03-07 NOTE — Telephone Encounter (Signed)
  Patient Consent for Virtual Visit         Janet Wilson has provided verbal consent on 03/07/2020 for a virtual visit (video or telephone). (for 03/08/2020 appt with Dr Marlou Porch)   [place] VISIT FOR:  Janet Wilson  By participating in this virtual visit I agree to the following:  I hereby voluntarily request, consent and authorize [facility] and its employed or contracted physicians, physician assistants, nurse practitioners or other licensed health care professionals (the Practitioner), to provide me with telemedicine health care services (the "Services") as deemed necessary by the treating Practitioner. I acknowledge and consent to receive the Services by the Practitioner via telemedicine. I understand that the telemedicine visit will involve communicating with the Practitioner through live audiovisual communication technology and the disclosure of certain medical information by electronic transmission. I acknowledge that I have been given the opportunity to request an in-person assessment or other available alternative prior to the telemedicine visit and am voluntarily participating in the telemedicine visit.  I understand that I have the right to withhold or withdraw my consent to the use of telemedicine in the course of my care at any time, without affecting my right to future care or treatment, and that the Practitioner or I may terminate the telemedicine visit at any time. I understand that I have the right to inspect all information obtained and/or recorded in the course of the telemedicine visit and may receive copies of available information for a reasonable fee.  I understand that some of the potential risks of receiving the Services via telemedicine include:  Marland Kitchen Delay or interruption in medical evaluation due to technological equipment failure or disruption; . Information transmitted may not be sufficient (e.g. poor resolution of images) to allow  for appropriate medical decision making by the Practitioner; and/or  . In rare instances, security protocols could fail, causing a breach of personal health information.  Furthermore, I acknowledge that it is my responsibility to provide information about my medical history, conditions and care that is complete and accurate to the best of my ability. I acknowledge that Practitioner's advice, recommendations, and/or decision may be based on factors not within their control, such as incomplete or inaccurate data provided by me or distortions of diagnostic images or specimens that may result from electronic transmissions. I understand that the practice of medicine is not an exact science and that Practitioner makes no warranties or guarantees regarding treatment outcomes. I acknowledge that a copy of this consent can be made available to me via my patient portal ([facility]), or I can request a printed copy by calling the office of [facility].    I understand that my insurance will be billed for this visit.   I have read or had this consent read to me. . I understand the contents of this consent, which adequately explains the benefits and risks of the Services being provided via telemedicine.  . I have been provided ample opportunity to ask questions regarding this consent and the Services and have had my questions answered to my satisfaction. . I give my informed consent for the services to be provided through the use of telemedicine in my medical care

## 2020-03-08 ENCOUNTER — Telehealth (INDEPENDENT_AMBULATORY_CARE_PROVIDER_SITE_OTHER): Payer: Medicare Other | Admitting: Cardiology

## 2020-03-08 ENCOUNTER — Other Ambulatory Visit: Payer: Self-pay

## 2020-03-08 ENCOUNTER — Encounter: Payer: Self-pay | Admitting: *Deleted

## 2020-03-08 ENCOUNTER — Encounter: Payer: Self-pay | Admitting: Cardiology

## 2020-03-08 VITALS — BP 118/76 | HR 98 | Wt 115.0 lb

## 2020-03-08 DIAGNOSIS — I700 Atherosclerosis of aorta: Secondary | ICD-10-CM

## 2020-03-08 DIAGNOSIS — I2510 Atherosclerotic heart disease of native coronary artery without angina pectoris: Secondary | ICD-10-CM

## 2020-03-08 DIAGNOSIS — I719 Aortic aneurysm of unspecified site, without rupture: Secondary | ICD-10-CM | POA: Diagnosis not present

## 2020-03-08 DIAGNOSIS — I2584 Coronary atherosclerosis due to calcified coronary lesion: Secondary | ICD-10-CM | POA: Diagnosis not present

## 2020-03-08 DIAGNOSIS — I351 Nonrheumatic aortic (valve) insufficiency: Secondary | ICD-10-CM

## 2020-03-08 NOTE — Progress Notes (Signed)
Virtual Visit via Video Note   This visit type was conducted due to national recommendations for restrictions regarding the COVID-19 Pandemic (e.g. social distancing) in an effort to limit this patient's exposure and mitigate transmission in our community.  Due to her co-morbid illnesses, this patient is at least at moderate risk for complications without adequate follow up.  This format is felt to be most appropriate for this patient at this time.  All issues noted in this document were discussed and addressed.  A limited physical exam was performed with this format.  Please refer to the patient's chart for her consent to telehealth for [facility].       Date:  03/08/2020   ID:  Janet Wilson, DOB [birth year], MRN [id number] The patient was identified using 2 identifiers.  Patient Location: Home Provider Location: Home Office  PCP:  Josetta Huddle, MD  Cardiologist:  No primary care provider on file.  Electrophysiologist:  None   Evaluation Performed:  Follow-Up Visit    History of Present Illness:    Janet Wilson is a 86 y.o. female here for the follow-up of ascending aortic aneurysm 55 mm, coronary atherosclerosis with recent ER visit secondary to precordial pain.  In review of emergency department she woke up between 2 and 4 in the morning went to the bathroom and had uncomfortable sharp pain in her chest went through to her back.  No nausea no vomiting no shortness of breath no syncope.  She took an Advil and it went away about 2 hours later.Her EKG showed sinus rhythm left bundle branch block.  She had a CT scan of her chest that showed stability.  No dissection.  She was given 2 weeks of omeprazole in case heartburn was playing a role.  She feels better now.  Asymptomatic.  Janet Wilson is husband.  At previous encounter, she did ask for a second opinion with Dr. Ysidro Evert at [facility] cardiothoracic surgery.  They are still awaiting copies of the  images, echocardiogram, CT scans to be sent to [facility]/or perhaps picked up at our office so they can relay them to [facility].  She had seen Dr. Gilford Raid here.  In review from prior office note on January 26, 2020: Mother died at age 64 from myocardial infarction, she was a smoker at the time.  Has a strong family history of CAD.  Back in 2018 had ectopic atrial tachycardia.  Echocardiogram in 2013 showed mild aortic regurgitation at the time.  This is progressed to moderate.  Event monitor showed short runs of SVT ectopic atrial tachycardia.  No evidence of atrial fibrillation.  Left bundle branch block previous EKG 84.  Has occasional short-term memory difficulty with word finding.  Takes magnesium for cramping.  In review of that note, in 2017 she was seen at [facility] cardiology clinic without complaints reporting water aerobics and moving to retirement community in [place]. She wore an event monitor which describes short runs of SVT probable ectopic atrial rhythm or paroxysmal atrial tachycardia. No evidence of atrial flutter or fibrillation. She had an episode lasting about 30 minutes are a heart pounding through her clothing she stated back then. Has not had any further episodes.  The images have not been sent yet to [facility].    The patient does not have symptoms concerning for COVID-19 infection (fever, chills, cough, or new shortness of breath).    Past Medical History:  Diagnosis Date  . Arthritis    OA BOTH KNEES AND  HANDS  . Atrial tachycardia (HCC)    HX OF  . GERD (gastroesophageal reflux disease)   . Glaucoma    left eye  . Goiter    CAUSING COUGH, HOARSINESS AND DRY THROAT  . Headache(784.0)    MIGRAINES   . Overactive bladder   . Temporal arteritis [facility])    Past Surgical History:  Procedure Laterality Date  . ARTERY BIOPSY Right 07/21/2012   Procedure: BIOPSY TEMPORAL ARTERY;  Surgeon: Haywood Lasso, MD;  Location: [place];  Service: General;  Laterality:  Right;  . CERVICAL FUSION  2012   Dr. Vertell Limber  . EYE SURGERY     CATARACT EXTRACTION- BILATERAL  . INCONTINENCE SURGERY    . JOINT REPLACEMENT     right knee  . LEFT SHOULDER SURGERY    . RIGHT KNEE ARTHROSCOPY  11/2009    . ROBOTIC ASSISTED BILATERAL SALPINGO OOPHERECTOMY Bilateral 12/08/2013   Procedure: ROBOTIC ASSISTED LAPAROSCOPIC BILATERAL SALPINGO OOPHORECTOMY WITH STAGING;  Surgeon: Everitt Amber, MD;  Location: [facility];  Service: Gynecology;  Laterality: Bilateral;  . TOTAL KNEE ARTHROPLASTY  07/04/2011   Procedure: TOTAL KNEE ARTHROPLASTY;  Surgeon: Gearlean Alf, MD;  Location: [facility];  Service: Orthopedics;  Laterality: Right;     Current Meds  Medication Sig  . Biotin 5000 MCG CAPS Take 1 capsule by mouth daily.  Marland Kitchen BRIMONIDINE TARTRATE EX Apply topically.  . Calcium Carbonate-Vitamin D (CALCIUM PLUS VITAMIN D PO) Take 1 tablet by mouth daily.  . Coenzyme Q10 (CO Q 10) 100 MG CAPS Take 1 tablet by mouth at bedtime.   . Ibuprofen 200 MG CAPS Take 400 mg by mouth daily as needed (pain.).  . Magnesium 200 MG TABS Take 1 tablet by mouth daily.  . Omega-3 Fatty Acids (FISH OIL) 1200 MG CAPS Take 1 capsule by mouth daily.   Marland Kitchen omeprazole (PRILOSEC) 20 MG capsule Take 1 capsule (20 mg total) by mouth daily.  . Probiotic Product (PROBIOTIC PO) Take 1 capsule by mouth at bedtime. Ultra Flora Balance.  . rivastigmine (EXELON) 1.5 MG capsule TAKE 1 CAPSULE EACH MORNING.  . rivastigmine (EXELON) 3 MG capsule Take 1 capsule (3 mg total) by mouth at bedtime.  . rosuvastatin (CRESTOR) 10 MG tablet Take 1 tablet (10 mg total) by mouth daily.  . Rotigotine (NEUPRO) 1 MG/24HR PT24 Place 1 patch (1 mg total) onto the skin at bedtime.     Allergies:   Oxycodone, Mold extract [trichophyton], and Codeine   Social History   Tobacco Use  . Smoking status: Never Smoker  . Smokeless tobacco: Never Used  Vaping Use  . Vaping Use: Never used  Substance Use Topics  . Alcohol use: Yes     Alcohol/week: 1.0 standard drink    Types: 1 Glasses of wine per week    Comment: SELDOM, very little  . Drug use: No     Family Hx: The patient's family history includes Alzheimer's disease in her mother; Heart attack in her father.  ROS:   Please see the history of present illness.     All other systems reviewed and are negative.   Prior CV studies:   The following studies were reviewed today:  03/04/20 - CT chest IMPRESSION: Stable 5.5 cm ascending thoracic aortic aneurysm. No interval change or acute vascular finding in the chest by CTA  Labs/Other Tests and Data Reviewed:    EKG: 03/04/2020-sinus rhythm left bundle branch block no change from prior  Recent Labs: 03/04/2020: ALT  19; BUN 21; Creatinine, Ser 0.75; Hemoglobin 14.3; Platelets 172; Potassium 4.2; Sodium 139   Recent Lipid Panel Lab Results  Component Value Date/Time   CHOL 194 12/11/2017 10:24 AM   TRIG 53 12/11/2017 10:24 AM   HDL 59 12/11/2017 10:24 AM   LDLCALC 125 12/11/2017 10:24 AM    Wt Readings from Last 3 Encounters:  03/08/20 115 lb (52.2 kg)  03/04/20 115 lb (52.2 kg)  01/19/20 115 lb (52.2 kg)     Risk Assessment/Calculations:     Objective:    Vital Signs:  BP 118/76   Pulse 98   Wt 115 lb (52.2 kg)   LMP  (LMP Unknown)   BMI 20.37 kg/m    VITAL SIGNS:  reviewed GEN:  no acute distress EYES:  sclerae anicteric, EOMI - Extraocular Movements Intact RESPIRATORY:  normal respiratory effort, symmetric expansion SKIN:  no rash, lesions or ulcers. MUSCULOSKELETAL:  no obvious deformities. NEURO:  alert and oriented x 3, no obvious focal deficit PSYCH:  normal affect  ASSESSMENT & PLAN:    Ascending aortic aneurysm -55 mm on both echocardiogram as well as CT chest. -Prior evaluation by Dr. Cyndia Bent.  Requested evaluation by Dr. Ysidro Evert as well at [facility].  Clearly would be a high risk procedure. -ER visit for discomfort, showed stable CT. -There are still waiting on the  discs to be copied with her images of echocardiogram as well as CT scans to allow them to get seen at [facility].  I will relay this to our nursing staff to help expedite this process.  Moderate aortic regurgitation -Noted on echocardiogram.  Aortic valve would be replaced as well.  Family history of CAD with coronary artery calcification aortic atherosclerosis -Crestor was started at prior visit.  Lab work is set up for 03/19/2019 to check her lipid panel and ALT.  Continue for plaque stabilization.        COVID-19 Education: The signs and symptoms of COVID-19 were discussed with the patient and how to seek care for testing (follow up with PCP or arrange E-visit).  The importance of social distancing was discussed today.  Time:   Today, I have spent 31 minutes with the patient with telehealth technology and review of scans, med records,  discussing the above problems.     Medication Adjustments/Labs and Tests Ordered: Current medicines are reviewed at length with the patient today.  Concerns regarding medicines are outlined above.   Tests Ordered: No orders of the defined types were placed in this encounter.   Medication Changes: No orders of the defined types were placed in this encounter.   Follow Up:  In Person in 3 month(s)  Signed, Candee Furbish, MD  03/08/2020 9:54 AM    [place]

## 2020-03-08 NOTE — Patient Instructions (Signed)
Medication Instructions:   Your physician recommends that you continue on your current medications as directed. Please refer to the Current Medication list given to you today.  *If you need a refill on your cardiac medications before your next appointment, please call your pharmacy*   Follow-Up:  IN 3 MONTHS IN THE OFFICE WITH DR. Judithann Graves APPOINTMENT IS SCHEDULED FOR Jun 13, 2020 AT 8 AM--THIS IS IN PERSON OFFICE VISIT   WE WILL BE GETTING YOUR RECENT TEST UPLOADED ONTO A DISC AND RECORDS SENT TO DR. HUGHES OFFICE AT [facility].  OUR MEDICAL RECORDS DEPARTMENT WILL BE FOLLOWING UP WITH YOU IN REGARDS TO THIS.

## 2020-03-10 ENCOUNTER — Telehealth (HOSPITAL_COMMUNITY): Payer: Self-pay | Admitting: Radiology

## 2020-03-10 DIAGNOSIS — I712 Thoracic aortic aneurysm, without rupture: Secondary | ICD-10-CM | POA: Diagnosis not present

## 2020-03-10 NOTE — Telephone Encounter (Signed)
I called and spoke to the patient regarding Echo and CT images being sent to [facility] transplant department. She ask that call her daughter, Michiel Cowboy. She stated she is handling the gathering of history for the appointment. I called and spoke with Michiel Cowboy. I told her I had pushed the images over for the echo via [facility] to [facility]. I also informed her the CT records would also be powershared to [facility] Cardiology and Radiology. I asked her, if she still need hard copies of these exams. She stated she did not. We agreed she would contact [facility] to verify they received the exported and exam. She would then, call me back.

## 2020-03-18 ENCOUNTER — Other Ambulatory Visit: Payer: Medicare Other | Admitting: *Deleted

## 2020-03-18 ENCOUNTER — Other Ambulatory Visit: Payer: Self-pay

## 2020-03-18 DIAGNOSIS — I351 Nonrheumatic aortic (valve) insufficiency: Secondary | ICD-10-CM

## 2020-03-18 LAB — LIPID PANEL
Chol/HDL Ratio: 2 ratio (ref 0.0–4.4)
Cholesterol, Total: 134 mg/dL (ref 100–199)
HDL: 67 mg/dL (ref >39)
LDL Chol Calc (NIH): 57 mg/dL (ref 0–99)
Triglycerides: 39 mg/dL (ref 0–149)
VLDL Cholesterol Cal: 10 mg/dL (ref 5–40)

## 2020-03-18 LAB — ALT: ALT: 14 IU/L (ref 0–32)

## 2020-03-25 ENCOUNTER — Encounter: Payer: Self-pay | Admitting: *Deleted

## 2020-03-28 DIAGNOSIS — H401422 Capsular glaucoma with pseudoexfoliation of lens, left eye, moderate stage: Secondary | ICD-10-CM | POA: Diagnosis not present

## 2020-03-30 DIAGNOSIS — M810 Age-related osteoporosis without current pathological fracture: Secondary | ICD-10-CM | POA: Diagnosis not present

## 2020-03-30 DIAGNOSIS — K219 Gastro-esophageal reflux disease without esophagitis: Secondary | ICD-10-CM | POA: Diagnosis not present

## 2020-03-30 DIAGNOSIS — I10 Essential (primary) hypertension: Secondary | ICD-10-CM | POA: Diagnosis not present

## 2020-03-30 DIAGNOSIS — E7800 Pure hypercholesterolemia, unspecified: Secondary | ICD-10-CM | POA: Diagnosis not present

## 2020-03-30 DIAGNOSIS — G43009 Migraine without aura, not intractable, without status migrainosus: Secondary | ICD-10-CM | POA: Diagnosis not present

## 2020-04-01 DIAGNOSIS — M1712 Unilateral primary osteoarthritis, left knee: Secondary | ICD-10-CM | POA: Diagnosis not present

## 2020-04-04 DIAGNOSIS — M19011 Primary osteoarthritis, right shoulder: Secondary | ICD-10-CM | POA: Insufficient documentation

## 2020-04-18 DIAGNOSIS — E7800 Pure hypercholesterolemia, unspecified: Secondary | ICD-10-CM | POA: Diagnosis not present

## 2020-04-18 DIAGNOSIS — E673 Hypervitaminosis D: Secondary | ICD-10-CM | POA: Diagnosis not present

## 2020-04-18 DIAGNOSIS — I10 Essential (primary) hypertension: Secondary | ICD-10-CM | POA: Diagnosis not present

## 2020-04-18 DIAGNOSIS — Z0000 Encounter for general adult medical examination without abnormal findings: Secondary | ICD-10-CM | POA: Diagnosis not present

## 2020-04-18 DIAGNOSIS — E042 Nontoxic multinodular goiter: Secondary | ICD-10-CM | POA: Diagnosis not present

## 2020-04-18 DIAGNOSIS — M315 Giant cell arteritis with polymyalgia rheumatica: Secondary | ICD-10-CM | POA: Diagnosis not present

## 2020-04-18 DIAGNOSIS — Z8679 Personal history of other diseases of the circulatory system: Secondary | ICD-10-CM | POA: Diagnosis not present

## 2020-04-18 DIAGNOSIS — K219 Gastro-esophageal reflux disease without esophagitis: Secondary | ICD-10-CM | POA: Diagnosis not present

## 2020-04-18 DIAGNOSIS — Z79899 Other long term (current) drug therapy: Secondary | ICD-10-CM | POA: Diagnosis not present

## 2020-04-18 DIAGNOSIS — Z7189 Other specified counseling: Secondary | ICD-10-CM | POA: Diagnosis not present

## 2020-04-18 DIAGNOSIS — G43009 Migraine without aura, not intractable, without status migrainosus: Secondary | ICD-10-CM | POA: Diagnosis not present

## 2020-04-18 DIAGNOSIS — M810 Age-related osteoporosis without current pathological fracture: Secondary | ICD-10-CM | POA: Diagnosis not present

## 2020-04-18 DIAGNOSIS — R413 Other amnesia: Secondary | ICD-10-CM | POA: Diagnosis not present

## 2020-04-18 DIAGNOSIS — Z1389 Encounter for screening for other disorder: Secondary | ICD-10-CM | POA: Diagnosis not present

## 2020-05-13 DIAGNOSIS — M170 Bilateral primary osteoarthritis of knee: Secondary | ICD-10-CM | POA: Diagnosis not present

## 2020-06-07 DIAGNOSIS — K219 Gastro-esophageal reflux disease without esophagitis: Secondary | ICD-10-CM | POA: Diagnosis not present

## 2020-06-07 DIAGNOSIS — I10 Essential (primary) hypertension: Secondary | ICD-10-CM | POA: Diagnosis not present

## 2020-06-07 DIAGNOSIS — E7800 Pure hypercholesterolemia, unspecified: Secondary | ICD-10-CM | POA: Diagnosis not present

## 2020-06-07 DIAGNOSIS — G43009 Migraine without aura, not intractable, without status migrainosus: Secondary | ICD-10-CM | POA: Diagnosis not present

## 2020-06-07 DIAGNOSIS — M810 Age-related osteoporosis without current pathological fracture: Secondary | ICD-10-CM | POA: Diagnosis not present

## 2020-06-13 ENCOUNTER — Other Ambulatory Visit: Payer: Self-pay

## 2020-06-13 ENCOUNTER — Encounter: Payer: Self-pay | Admitting: Cardiology

## 2020-06-13 ENCOUNTER — Ambulatory Visit: Payer: Medicare Other | Admitting: Cardiology

## 2020-06-13 VITALS — BP 112/60 | HR 107 | Ht 63.0 in | Wt 114.0 lb

## 2020-06-13 DIAGNOSIS — I700 Atherosclerosis of aorta: Secondary | ICD-10-CM | POA: Diagnosis not present

## 2020-06-13 DIAGNOSIS — I2584 Coronary atherosclerosis due to calcified coronary lesion: Secondary | ICD-10-CM | POA: Diagnosis not present

## 2020-06-13 DIAGNOSIS — I719 Aortic aneurysm of unspecified site, without rupture: Secondary | ICD-10-CM | POA: Diagnosis not present

## 2020-06-13 DIAGNOSIS — I2510 Atherosclerotic heart disease of native coronary artery without angina pectoris: Secondary | ICD-10-CM

## 2020-06-13 NOTE — Patient Instructions (Signed)

## 2020-06-13 NOTE — Progress Notes (Signed)
Cardiology Office Note:    Date:  06/13/2020   ID:  Janet Wilson, DOB 1934/04/03, MRN [id number]  PCP:  Josetta Huddle, [place]  Cardiologist:  No primary care provider on file.  Advanced Practice Provider:  No care team member to display Electrophysiologist:  None       Referring MD: Josetta Huddle, MD    History of Present Illness:    Janet Wilson is a 86 y.o. female here for follow-up of ascending aortic aneurysm previously 36 mm with coronary atherosclerosis.  Left bundle branch block.  Previously sought the opinion of Dr. Ysidro Evert at [facility] cardiothoracic surgery.  She had seen Dr. Cyndia Bent here.  Ultimately, she and her family decided not to proceed with surgical repair of aorta.  Quite well without any fevers chills.  Wellness vitamins  Mother died age 63 from MI, strong family history of CAD  In 2018 had an ectopic atrial tachycardia  Event monitor previously had shown short runs of SVT ectopic atrial tachycardia.  No evidence of atrial fibrillation.  Has had occasional short-term memory difficulty with word finding.  Can take magnesium for cramping however she understands that she needs to be careful especially if she has been diarrhea she sees Dr. Collene Mares.  Past Medical History:  Diagnosis Date  . Arthritis    OA BOTH KNEES AND HANDS  . Atrial tachycardia (HCC)    HX OF  . GERD (gastroesophageal reflux disease)   . Glaucoma    left eye  . Goiter    CAUSING COUGH, HOARSINESS AND DRY THROAT  . Headache(784.0)    MIGRAINES   . Overactive bladder   . Temporal arteritis [facility])     Past Surgical History:  Procedure Laterality Date  . ARTERY BIOPSY Right 07/21/2012   Procedure: BIOPSY TEMPORAL ARTERY;  Surgeon: Haywood Lasso, MD;  Location: [place];  Service: General;  Laterality: Right;  . CERVICAL FUSION  2012   Dr. Vertell Limber  . EYE SURGERY     CATARACT EXTRACTION- BILATERAL  . INCONTINENCE SURGERY     . JOINT REPLACEMENT     right knee  . LEFT SHOULDER SURGERY    . RIGHT KNEE ARTHROSCOPY  11/2009    . ROBOTIC ASSISTED BILATERAL SALPINGO OOPHERECTOMY Bilateral 12/08/2013   Procedure: ROBOTIC ASSISTED LAPAROSCOPIC BILATERAL SALPINGO OOPHORECTOMY WITH STAGING;  Surgeon: Everitt Amber, MD;  [facility];  Service: Gynecology;  Laterality: Bilateral;  . TOTAL KNEE ARTHROPLASTY  07/04/2011   Procedure: TOTAL KNEE ARTHROPLASTY;  Surgeon: Gearlean Alf, MD;  [facility];  Service: Orthopedics;  Laterality: Right;    Current Medications: Current Meds  Medication Sig  . BRIMONIDINE TARTRATE EX Apply topically.  . Calcium Carbonate-Vitamin D (CALCIUM PLUS VITAMIN D PO) Take 1 tablet by mouth daily.  . Coenzyme Q10 (CO Q 10) 100 MG CAPS Take 1 tablet by mouth at bedtime.   . ibandronate (BONIVA) 150 MG tablet Take 150 mg by mouth every 30 (thirty) days.   . Ibuprofen 200 MG CAPS Take 400 mg by mouth daily as needed (pain.).  . Magnesium 200 MG TABS Take 1 tablet by mouth daily.  . Omega-3 Fatty Acids (FISH OIL) 1200 MG CAPS Take 1 capsule by mouth daily.   . Probiotic Product (PROBIOTIC PO) Take 1 capsule by mouth at bedtime. Ultra Flora Balance.  . rivastigmine (EXELON) 1.5 MG capsule TAKE 1 CAPSULE EACH MORNING.  . rivastigmine (EXELON) 3 MG capsule Take  1 capsule (3 mg total) by mouth at bedtime.  . rosuvastatin (CRESTOR) 10 MG tablet Take 1 tablet (10 mg total) by mouth daily.  . Rotigotine (NEUPRO) 1 MG/24HR PT24 Place 1 patch (1 mg total) onto the skin at bedtime.     Allergies:   Oxycodone, Mold extract [trichophyton], and Codeine   Social History   Socioeconomic History  . Marital status: Married    Spouse name: [name]  . Number of children: 2  . Years of education: college  . Highest education level: Not on file  Occupational History  . Occupation: retired    Fish farm manager: RETIRED  Tobacco Use  . Smoking status: Never Smoker  . Smokeless tobacco: Never Used  Vaping Use   . Vaping Use: Never used  Substance and Sexual Activity  . Alcohol use: Yes    Alcohol/week: 1.0 standard drink    Types: 1 Glasses of wine per week    Comment: SELDOM, very little  . Drug use: No  . Sexual activity: Yes  Other Topics Concern  . Not on file  Social History Narrative   Pt lives at home with family.   Caffeine Use: Very little.    Patient is right handed       Social History      Diet? No       Do you drink/eat things with caffeine? Some coffee      Marital status?    Married                                What year were you married? 1961      Do you live in a house, apartment, assisted living, condo, trailer, etc.?  [place]       Is it one or more stories? one      How many persons live in your home? two      Do you have any pets in your home? No       Highest level of education completed? 14 years       Current or past profession:  Chemical engineer       Do you exercise?              Yes                        Type & how often? Water aerobic and balance class      Advanced Directives      Do you have a living will?  yes      Do you have a DNR form?        yes                          If not, do you want to discuss one?      Do you have signed POA/HPOA for forms? yes      Functional Status      Do you have difficulty bathing or dressing yourself?      Do you have difficulty preparing food or eating?       Do you have difficulty managing your medications?      Do you have difficulty managing your finances?      Do you have difficulty affording your medications?   Social Determinants of Health   Financial Resource Strain: Not on file  Food Insecurity: Not  on file  Transportation Needs: Not on file  Physical Activity: Not on file  Stress: Not on file  Social Connections: Not on file     Family History: The patient's family history includes Alzheimer's disease in her mother; Heart attack in her father.  ROS:   Please see the history  of present illness.     All other systems reviewed and are negative.  EKGs/Labs/Other Studies Reviewed:    The following studies were reviewed today:  CT of chest 03/04/2020 - Stable 5.5 cm ascending thoracic aortic aneurysm no significant change from prior.  EKG:  EKG is  ordered today.  The ekg ordered today demonstrates sinus tachycardia 107 left bundle branch block, during exam, heart rate decreased.  Recent Labs: 03/04/2020: BUN 21; Creatinine, Ser 0.75; Hemoglobin 14.3; Platelets 172; Potassium 4.2; Sodium 139 03/18/2020: ALT 14  Recent Lipid Panel    Component Value Date/Time   CHOL 134 03/18/2020 0745   TRIG 39 03/18/2020 0745   HDL 67 03/18/2020 0745   CHOLHDL 2.0 03/18/2020 0745   LDLCALC 57 03/18/2020 0745     Risk Assessment/Calculations:      Physical Exam:    VS:  BP 112/60 (BP Location: Left Arm, Patient Position: Sitting, Cuff Size: Normal)   Pulse (!) 107   Ht 5\' 3"  (1.6 m)   Wt 114 lb (51.7 kg)   LMP  (LMP Unknown)   SpO2 95%   BMI 20.19 kg/m     Wt Readings from Last 3 Encounters:  06/13/20 114 lb (51.7 kg)  03/08/20 115 lb (52.2 kg)  03/04/20 115 lb (52.2 kg)     GEN:  Well nourished, well developed in no acute distress HEENT: Normal NECK: No JVD; No carotid bruits LYMPHATICS: No lymphadenopathy CARDIAC: RRR, soft diastolic murmur, no rubs, gallops RESPIRATORY:  Clear to auscultation without rales, wheezing or rhonchi  ABDOMEN: Soft, non-tender, non-distended MUSCULOSKELETAL:  No edema; No deformity  SKIN: Warm and dry NEUROLOGIC:  Alert and oriented x 3 PSYCHIATRIC:  Normal affect   ASSESSMENT:    1. Aortic atherosclerosis ([place])   2. Coronary artery calcification   3. Aortic aneurysm without rupture, unspecified portion of aorta (HCC)    PLAN:    In order of problems listed above:  Ascending aortic aneurysm - 55 mm on both echocardiogram as well as CT scan previously done in January 2022 - Appreciate cardiothoracic surgery  evaluation both at [facility] and here with Dr. Cyndia Bent.  Clearly would be a high risk procedure.  Surgery would be ascending Hemi arch if aortic valve mild regurgitation only would defer replacement due to shorten clamp time and could have a potential TAVR later.  Recommendation was made by Dr. Ysidro Evert for ascending Hemi arch replacement but her and her family weighed the pros and cons of surgery and decided not to proceed.  She and her family are comfortable with this decision of not proceeding with surgery and I support them.  They will be talking with Dr. Cyndia Bent soon.  He will be made aware.  Continue with weight lifting restrictions.  She is doing well.  Moderate aortic regurgitation - Demonstrated on echocardiogram.  Minimal murmur heard on exam.  Doing well without any shortness of breath.  Family history of CAD - Crestor started on previous visit.  LDL responded excellently at 57.  No myalgias.   55-month follow-up  Medication Adjustments/Labs and Tests Ordered: Current medicines are reviewed at length with the patient today.  Concerns regarding medicines are  outlined above.  Orders Placed This Encounter  Procedures  . EKG 12-Lead   No orders of the defined types were placed in this encounter.   Patient Instructions  Medication Instructions:  The current medical regimen is effective;  continue present plan and medications.  *If you need a refill on your cardiac medications before your next appointment, please call your pharmacy*  Follow-Up: At [facility], you and your health needs are our priority.  As part of our continuing mission to provide you with exceptional heart care, we have created designated Provider Care Teams.  These Care Teams include your primary Cardiologist (physician) and Advanced Practice Providers (APPs -  Physician Assistants and Nurse Practitioners) who all work together to provide you with the care you need, when you need it.  We recommend signing up for the  patient portal called "MyChart".  Sign up information is provided on this After Visit Summary.  MyChart is used to connect with patients for Virtual Visits (Telemedicine).  Patients are able to view lab/test results, encounter notes, upcoming appointments, etc.  Non-urgent messages can be sent to your provider as well.   To learn more about what you can do with MyChart, go to [email or web address].    Your next appointment:   6 month(s)  The format for your next appointment:   In Person  Provider:   Candee Furbish, MD   Thank you for choosing [facility]!!        Signed, Candee Furbish, MD  06/13/2020 9:19 AM    [place]

## 2020-06-20 DIAGNOSIS — R946 Abnormal results of thyroid function studies: Secondary | ICD-10-CM | POA: Diagnosis not present

## 2020-06-20 DIAGNOSIS — R7989 Other specified abnormal findings of blood chemistry: Secondary | ICD-10-CM | POA: Diagnosis not present

## 2020-06-20 DIAGNOSIS — R1310 Dysphagia, unspecified: Secondary | ICD-10-CM | POA: Diagnosis not present

## 2020-06-20 DIAGNOSIS — R490 Dysphonia: Secondary | ICD-10-CM | POA: Diagnosis not present

## 2020-06-20 DIAGNOSIS — R634 Abnormal weight loss: Secondary | ICD-10-CM | POA: Diagnosis not present

## 2020-06-20 DIAGNOSIS — E041 Nontoxic single thyroid nodule: Secondary | ICD-10-CM | POA: Diagnosis not present

## 2020-06-20 DIAGNOSIS — E042 Nontoxic multinodular goiter: Secondary | ICD-10-CM | POA: Diagnosis not present

## 2020-06-30 DIAGNOSIS — M1712 Unilateral primary osteoarthritis, left knee: Secondary | ICD-10-CM | POA: Diagnosis not present

## 2020-07-01 ENCOUNTER — Other Ambulatory Visit: Payer: Self-pay | Admitting: *Deleted

## 2020-07-01 DIAGNOSIS — I712 Thoracic aortic aneurysm, without rupture, unspecified: Secondary | ICD-10-CM

## 2020-07-14 DIAGNOSIS — M1712 Unilateral primary osteoarthritis, left knee: Secondary | ICD-10-CM | POA: Diagnosis not present

## 2020-07-27 DIAGNOSIS — M19011 Primary osteoarthritis, right shoulder: Secondary | ICD-10-CM | POA: Diagnosis not present

## 2020-07-29 ENCOUNTER — Encounter: Payer: Self-pay | Admitting: Surgery

## 2020-08-04 DIAGNOSIS — H401422 Capsular glaucoma with pseudoexfoliation of lens, left eye, moderate stage: Secondary | ICD-10-CM | POA: Diagnosis not present

## 2020-08-10 ENCOUNTER — Encounter: Payer: Medicare Other | Admitting: Surgery

## 2020-08-10 ENCOUNTER — Other Ambulatory Visit: Payer: Medicare Other

## 2020-08-10 DIAGNOSIS — M810 Age-related osteoporosis without current pathological fracture: Secondary | ICD-10-CM | POA: Diagnosis not present

## 2020-08-10 DIAGNOSIS — K219 Gastro-esophageal reflux disease without esophagitis: Secondary | ICD-10-CM | POA: Diagnosis not present

## 2020-08-10 DIAGNOSIS — G43009 Migraine without aura, not intractable, without status migrainosus: Secondary | ICD-10-CM | POA: Diagnosis not present

## 2020-08-10 DIAGNOSIS — I10 Essential (primary) hypertension: Secondary | ICD-10-CM | POA: Diagnosis not present

## 2020-08-10 DIAGNOSIS — E7800 Pure hypercholesterolemia, unspecified: Secondary | ICD-10-CM | POA: Diagnosis not present

## 2020-08-17 ENCOUNTER — Other Ambulatory Visit: Payer: Self-pay

## 2020-08-17 ENCOUNTER — Encounter: Payer: Self-pay | Admitting: Surgery

## 2020-08-17 ENCOUNTER — Ambulatory Visit: Payer: Medicare Other | Admitting: Surgery

## 2020-08-17 ENCOUNTER — Ambulatory Visit
Admission: RE | Admit: 2020-08-17 | Discharge: 2020-08-17 | Disposition: A | Discharge status: Home / Self Care | Payer: Medicare Other | Source: Ambulatory Visit | Attending: Surgery | Admitting: Surgery

## 2020-08-17 VITALS — BP 130/73 | HR 88 | Resp 20 | Ht 63.0 in | Wt 114.0 lb

## 2020-08-17 DIAGNOSIS — I712 Thoracic aortic aneurysm, without rupture, unspecified: Secondary | ICD-10-CM

## 2020-08-17 DIAGNOSIS — I2584 Coronary atherosclerosis due to calcified coronary lesion: Secondary | ICD-10-CM | POA: Diagnosis not present

## 2020-08-17 MED ORDER — IOPAMIDOL (ISOVUE-370) INJECTION 76%
75.0000 mL | Freq: Once | INTRAVENOUS | Status: AC | PRN
  Administered 2020-08-17: 75 mL via INTRAVENOUS

## 2020-08-18 NOTE — Progress Notes (Signed)
HPI:  The patient is an 86 year old woman with a history of arthritis status post right total knee replacement in 2011 and cervical fusion in 2012, GERD, hypertension, thyroid goiter causing cough and hoarse, glaucoma, temporal arteritis, SVT, and mild cognitive impairment and RLS followed by neurology who was diagnosed with mild aortic stenosis with a mean gradient of 14 mmHg, moderate aortic insufficiency, and a 5.5 cm ascending aortic aneurysm on echocardiogram done to evaluate atrial tachycardia.  She had a CTA of the chest, abdomen, and pelvis which showed a 5.5 cm fusiform ascending aortic aneurysm without dissection.  The proximal aortic arch was 5 cm in the transverse arch between the left common carotid and left subclavian decreased to 3 cm.  The proximal descending aorta was 3.1 cm.  There was some coronary calcifications suggesting coronary disease.  I saw her in initial consultation on 01/19/2020 and after a long discussion with the patient and her family we decided to continue following this for a while given her advanced age and mild neurocognitive dysfunction.  Since I last saw her she went to see Dr. Mali Hughes at [facility] for second opinion and he recommended proceeding with replacement of her ascending aorta and Hemi arch, possibly leaving the aortic valve behind to replace with TAVR in the future to minimize the length and complexity of surgery.  The patient was not interested in proceeding with surgery and decide to continue following the aneurysm.  She continues to feel well with no chest or back pain.  She denies any shortness of breath.  She said that she does tire easily.  Her neurocognitive dysfunction has been stable.  She is here today with her daughter.  Current Outpatient Medications  Medication Sig Dispense Refill   BRIMONIDINE TARTRATE EX Apply topically.     Calcium Carbonate-Vitamin D (CALCIUM PLUS VITAMIN D PO) Take 1 tablet by mouth daily.     Coenzyme Q10 (CO Q 10) 100  MG CAPS Take 1 tablet by mouth at bedtime.      ibandronate (BONIVA) 150 MG tablet Take 150 mg by mouth every 30 (thirty) days.      Ibuprofen 200 MG CAPS Take 400 mg by mouth daily as needed (pain.).     Magnesium 200 MG TABS Take 1 tablet by mouth daily.     Omega-3 Fatty Acids (FISH OIL) 1200 MG CAPS Take 1 capsule by mouth daily.      Probiotic Product (PROBIOTIC PO) Take 1 capsule by mouth at bedtime. Ultra Flora Balance.     rivastigmine (EXELON) 1.5 MG capsule TAKE 1 CAPSULE EACH MORNING. 30 capsule 11   rivastigmine (EXELON) 3 MG capsule Take 1 capsule (3 mg total) by mouth at bedtime. 30 capsule 11   Rotigotine (NEUPRO) 1 MG/24HR PT24 Place 1 patch (1 mg total) onto the skin at bedtime. 30 patch 11   rosuvastatin (CRESTOR) 10 MG tablet Take 1 tablet (10 mg total) by mouth daily. 90 tablet 3   No current facility-administered medications for this visit.     Physical Exam: BP 130/73   Pulse 88   Resp 20   Ht 5\' 3"  (1.6 m)   Wt 114 lb (51.7 kg)   LMP  (LMP Unknown)   SpO2 97% Comment: RA  BMI 20.19 kg/m  She looks well. Cardiac exam shows a regular rate and rhythm with normal heart sounds.  There is no murmur. Lungs are clear.  Diagnostic Tests:  Narrative & Impression  CLINICAL DATA:  Follow-up thoracic aortic aneurysm.   EXAM: CT ANGIOGRAPHY CHEST WITH CONTRAST   TECHNIQUE: Multidetector CT imaging of the chest was performed using the standard protocol during bolus administration of intravenous contrast. Multiplanar CT image reconstructions and MIPs were obtained to evaluate the vascular anatomy.   CONTRAST:  69mL ISOVUE-370 IOPAMIDOL (ISOVUE-370) INJECTION 76%   Creatinine was obtained on site at [facility] at [address]   Results: Creatinine 0.8 mg/dL.   COMPARISON:  Most recent CT a 03/04/2020   FINDINGS: Cardiovascular: Fusiform dilatation of the ascending aorta measuring 5.5 cm (using same measurement technique). Descending aorta  is tortuous, similar to prior. No dissection, periaortic stranding or acute aortic findings. Moderate aortic atherosclerosis. Branch vessel tortuosity again seen. Multi chamber cardiomegaly. Trace physiologic pericardial fluid. Cannot assess for pulmonary embolus given phase of contrast tailored to aortic evaluation. There are coronary artery calcifications.   Mediastinum/Nodes: No enlarged mediastinal or hilar lymph nodes. Stable heterogeneous enhancement and nodularity of the thyroid consistent with goiter. No esophageal wall thickening.   Lungs/Pleura: Right apical calcified granuloma, unchanged. Minimal hypoventilatory changes in the dependent lungs. No acute airspace disease. No pleural fluid. No pulmonary nodule or mass.   Upper Abdomen: No acute findings small anterior perihepatic node is unchanged from prior.   Musculoskeletal: Exaggerated thoracic kyphosis with degenerative change in the midthoracic spine. Surgical hardware in the lower cervical spine is partially included. There are no acute or suspicious osseous abnormalities.   Review of the MIP images confirms the above findings.   IMPRESSION: 1. Fusiform dilatation of the ascending aorta measuring 5.5 cm, unchanged from prior exam. No dissection or acute aortic abnormality. Recommend semiannual imaging follow-up by CTA or MRA. 2. No acute intrathoracic findings. 3. Coronary artery calcifications.   Aortic Atherosclerosis (ICD10-I70.0).     Electronically Signed   By: Keith Rake M.D.   On: 08/17/2020 16:08      Impression: This 86 year old woman has a stable 5.5 cm fusiform ascending aortic aneurysm that extends into the proximal aortic arch.  She has a possible bicuspid aortic valve that is severely calcified and thickened with mild aortic stenosis and moderate aortic insufficiency by echocardiogram in November 2021.  She remains asymptomatic.  Her aneurysm is at the surgical threshold but her operative  risk would be increased due to her advanced age and mild neurocognitive decline.  This aneurysm extends into the proximal aortic arch and would require a period of circulatory arrest.  She has coronary calcifications that could indicate coronary lesions that need to be dealt with at the same time although she is asymptomatic.  We could leave her aortic valve behind and do a supra coronary graft but that still leaves her with valvular disease that may require TAVR and its associated risks.  I discussed all this with the patient and her daughter.  Her blood pressure is under good control and I think it is reasonable to continue following this for now.  She would like to avoid surgery.  She and her daughter understand that there is an increased risk of aortic dissection with aneurysms at 5.5 cm or greater but I think the risk of this complex surgery for her is about the same as the risk of aortic dissection.  She would like to continue following this for now.  Plan:  I will plan to see her back in 6 months with a CTA of the chest.  I spent 20 minutes performing this established patient evaluation and > 50% of  this time was spent face to face counseling and coordinating the care of this patient's aortic aneurysm.    Gaye Pollack, MD [facility] [phone]

## 2020-08-25 DIAGNOSIS — M1712 Unilateral primary osteoarthritis, left knee: Secondary | ICD-10-CM | POA: Diagnosis not present

## 2020-09-19 ENCOUNTER — Other Ambulatory Visit: Payer: Self-pay | Admitting: Cardiology

## 2020-09-19 DIAGNOSIS — H401422 Capsular glaucoma with pseudoexfoliation of lens, left eye, moderate stage: Secondary | ICD-10-CM | POA: Diagnosis not present

## 2020-09-26 DIAGNOSIS — R634 Abnormal weight loss: Secondary | ICD-10-CM | POA: Diagnosis not present

## 2020-09-26 DIAGNOSIS — E049 Nontoxic goiter, unspecified: Secondary | ICD-10-CM | POA: Diagnosis not present

## 2020-09-26 DIAGNOSIS — R7989 Other specified abnormal findings of blood chemistry: Secondary | ICD-10-CM | POA: Diagnosis not present

## 2020-10-10 ENCOUNTER — Telehealth: Payer: Self-pay | Admitting: Neurology

## 2020-10-10 ENCOUNTER — Other Ambulatory Visit: Payer: Self-pay | Admitting: Neurology

## 2020-10-10 MED ORDER — NEUPRO 1 MG/24HR TD PT24: 1.0000 mg | MEDICATED_PATCH | Freq: Every day | TRANSDERMAL | 4 refills | Status: DC

## 2020-10-10 MED ORDER — RIVASTIGMINE TARTRATE 3 MG PO CAPS: 3.0000 mg | ORAL_CAPSULE | Freq: Every day | ORAL | 4 refills | Status: DC

## 2020-10-10 MED ORDER — RIVASTIGMINE TARTRATE 1.5 MG PO CAPS: ORAL_CAPSULE | ORAL | 4 refills | Status: DC

## 2020-10-10 NOTE — Telephone Encounter (Signed)
I spoke to patient. Please cancel her appointment tomorrow, do not charge her a fee, this is per physician request so this is NOT a no show. thanks

## 2020-10-11 ENCOUNTER — Ambulatory Visit: Payer: Medicare Other | Admitting: Adult Health

## 2020-10-11 DIAGNOSIS — M810 Age-related osteoporosis without current pathological fracture: Secondary | ICD-10-CM | POA: Diagnosis not present

## 2020-10-11 DIAGNOSIS — G43009 Migraine without aura, not intractable, without status migrainosus: Secondary | ICD-10-CM | POA: Diagnosis not present

## 2020-10-11 DIAGNOSIS — K219 Gastro-esophageal reflux disease without esophagitis: Secondary | ICD-10-CM | POA: Diagnosis not present

## 2020-10-11 DIAGNOSIS — I10 Essential (primary) hypertension: Secondary | ICD-10-CM | POA: Diagnosis not present

## 2020-10-11 DIAGNOSIS — E7800 Pure hypercholesterolemia, unspecified: Secondary | ICD-10-CM | POA: Diagnosis not present

## 2020-10-25 ENCOUNTER — Other Ambulatory Visit: Payer: Self-pay | Admitting: Neurology

## 2020-10-25 DIAGNOSIS — R519 Headache, unspecified: Secondary | ICD-10-CM

## 2020-11-09 DIAGNOSIS — M315 Giant cell arteritis with polymyalgia rheumatica: Secondary | ICD-10-CM | POA: Diagnosis not present

## 2020-11-09 DIAGNOSIS — E7800 Pure hypercholesterolemia, unspecified: Secondary | ICD-10-CM | POA: Diagnosis not present

## 2020-11-09 DIAGNOSIS — Z8679 Personal history of other diseases of the circulatory system: Secondary | ICD-10-CM | POA: Diagnosis not present

## 2020-11-09 DIAGNOSIS — G43009 Migraine without aura, not intractable, without status migrainosus: Secondary | ICD-10-CM | POA: Diagnosis not present

## 2020-11-09 DIAGNOSIS — I10 Essential (primary) hypertension: Secondary | ICD-10-CM | POA: Diagnosis not present

## 2020-11-09 DIAGNOSIS — K219 Gastro-esophageal reflux disease without esophagitis: Secondary | ICD-10-CM | POA: Diagnosis not present

## 2020-11-09 DIAGNOSIS — M810 Age-related osteoporosis without current pathological fracture: Secondary | ICD-10-CM | POA: Diagnosis not present

## 2020-11-09 DIAGNOSIS — E042 Nontoxic multinodular goiter: Secondary | ICD-10-CM | POA: Diagnosis not present

## 2020-11-09 DIAGNOSIS — Z23 Encounter for immunization: Secondary | ICD-10-CM | POA: Diagnosis not present

## 2020-11-09 DIAGNOSIS — E673 Hypervitaminosis D: Secondary | ICD-10-CM | POA: Diagnosis not present

## 2020-11-29 DIAGNOSIS — Z681 Body mass index (BMI) 19 or less, adult: Secondary | ICD-10-CM | POA: Diagnosis not present

## 2020-11-29 DIAGNOSIS — Z1231 Encounter for screening mammogram for malignant neoplasm of breast: Secondary | ICD-10-CM | POA: Diagnosis not present

## 2020-12-19 DIAGNOSIS — H401422 Capsular glaucoma with pseudoexfoliation of lens, left eye, moderate stage: Secondary | ICD-10-CM | POA: Diagnosis not present

## 2020-12-29 DIAGNOSIS — D2272 Melanocytic nevi of left lower limb, including hip: Secondary | ICD-10-CM | POA: Diagnosis not present

## 2020-12-29 DIAGNOSIS — D2372 Other benign neoplasm of skin of left lower limb, including hip: Secondary | ICD-10-CM | POA: Diagnosis not present

## 2020-12-29 DIAGNOSIS — D2271 Melanocytic nevi of right lower limb, including hip: Secondary | ICD-10-CM | POA: Diagnosis not present

## 2020-12-29 DIAGNOSIS — D1801 Hemangioma of skin and subcutaneous tissue: Secondary | ICD-10-CM | POA: Diagnosis not present

## 2020-12-29 DIAGNOSIS — L821 Other seborrheic keratosis: Secondary | ICD-10-CM | POA: Diagnosis not present

## 2021-01-02 DIAGNOSIS — G43009 Migraine without aura, not intractable, without status migrainosus: Secondary | ICD-10-CM | POA: Diagnosis not present

## 2021-01-02 DIAGNOSIS — K219 Gastro-esophageal reflux disease without esophagitis: Secondary | ICD-10-CM | POA: Diagnosis not present

## 2021-01-02 DIAGNOSIS — E7800 Pure hypercholesterolemia, unspecified: Secondary | ICD-10-CM | POA: Diagnosis not present

## 2021-01-02 DIAGNOSIS — M810 Age-related osteoporosis without current pathological fracture: Secondary | ICD-10-CM | POA: Diagnosis not present

## 2021-01-02 DIAGNOSIS — I10 Essential (primary) hypertension: Secondary | ICD-10-CM | POA: Diagnosis not present

## 2021-01-09 DIAGNOSIS — H401134 Primary open-angle glaucoma, bilateral, indeterminate stage: Secondary | ICD-10-CM | POA: Diagnosis not present

## 2021-01-09 DIAGNOSIS — H524 Presbyopia: Secondary | ICD-10-CM | POA: Diagnosis not present

## 2021-01-09 DIAGNOSIS — H52223 Regular astigmatism, bilateral: Secondary | ICD-10-CM | POA: Diagnosis not present

## 2021-01-16 DIAGNOSIS — H401422 Capsular glaucoma with pseudoexfoliation of lens, left eye, moderate stage: Secondary | ICD-10-CM | POA: Diagnosis not present

## 2021-01-17 ENCOUNTER — Other Ambulatory Visit: Payer: Self-pay | Admitting: Surgery

## 2021-01-17 DIAGNOSIS — I7121 Aneurysm of the ascending aorta, without rupture: Secondary | ICD-10-CM

## 2021-02-15 DIAGNOSIS — M25511 Pain in right shoulder: Secondary | ICD-10-CM | POA: Diagnosis not present

## 2021-02-15 DIAGNOSIS — M7580 Other shoulder lesions, unspecified shoulder: Secondary | ICD-10-CM | POA: Diagnosis not present

## 2021-02-17 DIAGNOSIS — I10 Essential (primary) hypertension: Secondary | ICD-10-CM | POA: Diagnosis not present

## 2021-02-17 DIAGNOSIS — E7800 Pure hypercholesterolemia, unspecified: Secondary | ICD-10-CM | POA: Diagnosis not present

## 2021-02-23 ENCOUNTER — Emergency Department (HOSPITAL_BASED_OUTPATIENT_CLINIC_OR_DEPARTMENT_OTHER): Payer: Medicare Other

## 2021-02-23 ENCOUNTER — Emergency Department (HOSPITAL_BASED_OUTPATIENT_CLINIC_OR_DEPARTMENT_OTHER)
Admission: EM | Admit: 2021-02-23 | Discharge: 2021-02-23 | Disposition: A | Discharge status: Home / Self Care | Payer: Medicare Other | Attending: Emergency Medicine | Admitting: Emergency Medicine

## 2021-02-23 ENCOUNTER — Encounter (HOSPITAL_BASED_OUTPATIENT_CLINIC_OR_DEPARTMENT_OTHER): Payer: Self-pay | Admitting: Emergency Medicine

## 2021-02-23 ENCOUNTER — Other Ambulatory Visit: Payer: Self-pay

## 2021-02-23 DIAGNOSIS — W19XXXA Unspecified fall, initial encounter: Secondary | ICD-10-CM | POA: Insufficient documentation

## 2021-02-23 DIAGNOSIS — R42 Dizziness and giddiness: Secondary | ICD-10-CM | POA: Diagnosis not present

## 2021-02-23 DIAGNOSIS — W228XXA Striking against or struck by other objects, initial encounter: Secondary | ICD-10-CM | POA: Insufficient documentation

## 2021-02-23 DIAGNOSIS — M47812 Spondylosis without myelopathy or radiculopathy, cervical region: Secondary | ICD-10-CM | POA: Diagnosis not present

## 2021-02-23 DIAGNOSIS — S0990XA Unspecified injury of head, initial encounter: Secondary | ICD-10-CM | POA: Diagnosis not present

## 2021-02-23 NOTE — Discharge Instructions (Addendum)
Please return to emergency department for any worsening or worrisome symptoms.   Based on the events which brought you to the ER today, it is possible that you may have a concussion. A concussion occurs when there is a blow to the head or body, with enough force to shake the brain and disrupt how the brain functions. You may experience symptoms such as headaches, sensitivity to light/noise, dizziness, cognitive slowing, difficulty concentrating / remembering, trouble sleeping and drowsiness. These symptoms may last anywhere from hours/days to potentially weeks/months. While these symptoms are very frustrating and perhaps debilitating, it is important that you remember that they will improve over time. Everyone has a different rate of recovery; it is difficult to predict when your symptoms will resolve. In order to allow for your brain to heal after the injury, we recommend that you see your primary physician or a physician knowledgeable in concussion management. We also advise you to let your body and brain rest: avoid physical activities (sports, gym, and exercise) and reduce cognitive demands (reading, texting, TV watching, computer use, video games, etc). School attendance, after-school activities and work may need to be modified to avoid increasing symptoms. We recommend against driving until until all symptoms have resolved. You should take 650mg  of Acetaminophen (Tylenol) every 4 hours as needed for pain control. Come back to the ER right away if you are having repeated episodes of vomiting, severe/worsening headache/dizziness or any other symptom that alarms you. We recommended that someone stay with you for the next 24 hours to monitor for these worrisome symptoms.

## 2021-02-23 NOTE — ED Provider Notes (Signed)
[facility] EMERGENCY DEPT Provider Note   CSN: [id number] Arrival date & time: 02/23/21  1412     History  Chief Complaint  Patient presents with   Janet Wilson is a 87 y.o. female.  This is a 87 y.o. female with significant medical history as below, including HLD, OA, aortic atherosclerosis who presents to the ED with complaint of fall.  Patient reports she was ambulating in her garage, she tripped over something on the floor, legs became tangled up and she fell.  Her head hit a shelf on the way to the ground.  Then she hit her knees.  No LOC, no thinners.  She has been ambulatory since the event.  She had some mild dizziness following the event which is since subsided.  She has been able to walk without difficulty.  No nausea or vomiting.  Her pain is well controlled at this time.  No numbness, tingling, vision or hearing changes.  No LOC or presyncope at the time of the fall. She has a mild throbbing headache which has improved since the onset.  She also has mild discomfort to her knees described as aching does not worsen from her baseline knee pain.  She has minimal neck discomfort at the base of her head, aching.  No numbness or tingling.  The history is provided by the patient and a relative. No language interpreter was used.  Fall Pertinent negatives include no chest pain, no abdominal pain, no headaches and no shortness of breath.  Patient Active Problem List   Diagnosis Date Noted   Coronary artery calcification 12/18/2019   Aortic atherosclerosis ([place]) 12/18/2019   Pure hypercholesterolemia 12/18/2019   Senile osteopenia 03/20/2018   H/O temporal arteritis 03/20/2018   Memory loss, short term 03/20/2018   Abnormal weight loss 03/20/2018   Nausea 03/20/2018   Mixed urge and stress incontinence 03/20/2018   Multinodular goiter (nontoxic) 03/20/2018   RLS (restless legs syndrome) 12/19/2017   Pain in right knee 10/09/2017   Ectopic  atrial tachycardia ([place]) 10/08/2016   Cough 09/06/2015   Hoarseness of voice 09/06/2015   Rhinitis, chronic 09/06/2015   Bilateral occipital neuralgia 06/16/2015   Convergence insufficiency 08/09/2014   Neck pain 03/05/2013   Capsular glaucoma with pseudoexfoliation of lens, left eye, moderate stage 12/22/2012   Right temporal headache 06/30/2012   Family history of coronary artery disease 09/24/2011   S/P TKR (total knee replacement) 09/24/2011   Tachycardia 08/10/2011   OA (osteoarthritis) of knee 07/04/2011       Home Medications Prior to Admission medications   Medication Sig Start Date End Date Taking? Authorizing Provider  BRIMONIDINE TARTRATE EX Apply topically.    [provider]  Calcium Carbonate-Vitamin D (CALCIUM PLUS VITAMIN D PO) Take 1 tablet by mouth daily.    [provider]  Coenzyme Q10 (CO Q 10) 100 MG CAPS Take 1 tablet by mouth at bedtime.     [provider]  ibandronate (BONIVA) 150 MG tablet Take 150 mg by mouth every 30 (thirty) days.  04/05/19   [provider]  Ibuprofen 200 MG CAPS Take 400 mg by mouth daily as needed (pain.).    [provider]  Magnesium 200 MG TABS Take 1 tablet by mouth daily.    [provider]  Omega-3 Fatty Acids (FISH OIL) 1200 MG CAPS Take 1 capsule by mouth daily.     [provider]  Probiotic Product (PROBIOTIC PO) Take 1 capsule  by mouth at bedtime. Ultra Flora Balance.    [provider]  rivastigmine (EXELON) 1.5 MG capsule TAKE 1 CAPSULE EACH MORNING. 10/10/20   Melvenia Beam, MD  rivastigmine (EXELON) 3 MG capsule Take 1 capsule (3 mg total) by mouth at bedtime. 10/10/20   Melvenia Beam, MD  rosuvastatin (CRESTOR) 10 MG tablet TAKE 1 TABLET BY MOUTH EVERY DAY 09/20/20   Jerline Pain, MD  Rotigotine (NEUPRO) 1 MG/24HR PT24 Place 1 patch (1 mg total) onto the skin at bedtime. 10/10/20   Melvenia Beam, MD      Allergies    Oxycodone, Mold extract  [trichophyton], Molds & smuts, and Codeine    Review of Systems   Review of Systems  Constitutional:  Negative for chills and fever.  HENT:  Negative for facial swelling and trouble swallowing.   Eyes:  Negative for photophobia and visual disturbance.  Respiratory:  Negative for cough and shortness of breath.   Cardiovascular:  Negative for chest pain and palpitations.  Gastrointestinal:  Negative for abdominal pain, nausea and vomiting.  Endocrine: Negative for polydipsia and polyuria.  Genitourinary:  Negative for difficulty urinating and hematuria.  Musculoskeletal:  Positive for arthralgias. Negative for gait problem and joint swelling.  Skin:  Negative for pallor and rash.  Neurological:  Negative for syncope and headaches.  Psychiatric/Behavioral:  Negative for agitation and confusion.    Physical Exam Updated Vital Signs BP 120/60 (BP Location: Right Arm)    Pulse 78    Temp 98.2 F (36.8 C)    Resp 16    Ht 5\' 3"  (1.6 m)    Wt 50.8 kg    LMP  (LMP Unknown)    SpO2 97%    BMI 19.84 kg/m  Physical Exam Vitals and nursing note reviewed.  Constitutional:      General: She is not in acute distress.    Appearance: Normal appearance. She is normal weight. She is not ill-appearing, toxic-appearing or diaphoretic.  HENT:     Head: Normocephalic and atraumatic. No raccoon eyes, Battle's sign, right periorbital erythema or left periorbital erythema.     Right Ear: External ear normal.     Left Ear: External ear normal.     Nose: Nose normal.     Mouth/Throat:     Mouth: Mucous membranes are moist.  Eyes:     General: No scleral icterus.       Right eye: No discharge.        Left eye: No discharge.     Extraocular Movements: Extraocular movements intact.     Pupils: Pupils are equal, round, and reactive to light.  Cardiovascular:     Rate and Rhythm: Normal rate and regular rhythm.     Pulses: Normal pulses.          Radial pulses are 2+ on the right side and 2+ on the left  side.       Dorsalis pedis pulses are 2+ on the right side and 2+ on the left side.     Heart sounds: Normal heart sounds.  Pulmonary:     Effort: Pulmonary effort is normal. No respiratory distress.     Breath sounds: Normal breath sounds.  Abdominal:     General: Abdomen is flat.     Palpations: Abdomen is soft.     Tenderness: There is no abdominal tenderness.  Musculoskeletal:        General: Normal range of motion.  Cervical back: Full passive range of motion without pain, normal range of motion and neck supple.     Right lower leg: No edema.     Left lower leg: No edema.  Skin:    General: Skin is warm and dry.     Capillary Refill: Capillary refill takes less than 2 seconds.  Neurological:     Mental Status: She is alert and oriented to person, place, and time.     GCS: GCS eye subscore is 4. GCS verbal subscore is 5. GCS motor subscore is 6.     Cranial Nerves: Cranial nerves 2-12 are intact. No dysarthria or facial asymmetry.     Sensory: Sensation is intact.     Motor: Motor function is intact. No tremor.     Coordination: Coordination is intact. Finger-Nose-Finger Test normal.     Gait: Gait is intact.  Psychiatric:        Mood and Affect: Mood normal.        Behavior: Behavior normal.    ED Results / Procedures / Treatments   Labs (all labs ordered are listed, but only abnormal results are displayed) Labs Reviewed - No data to display  EKG None  Radiology CT Head Wo Contrast  Result Date: 02/23/2021 CLINICAL DATA:  Neck trauma. Head trauma, moderate/severe. Additional history provided: Fall (hitting head). Dizziness. EXAM: CT HEAD WITHOUT CONTRAST CT CERVICAL SPINE WITHOUT CONTRAST TECHNIQUE: Multidetector CT imaging of the head and cervical spine was performed following the standard protocol without intravenous contrast. Multiplanar CT image reconstructions of the cervical spine were also generated. RADIATION DOSE REDUCTION: This exam was performed according  to the departmental dose-optimization program which includes automated exposure control, adjustment of the mA and/or kV according to patient size and/or use of iterative reconstruction technique. COMPARISON:  Brain MRI 09/06/2015 (images available, report unavailable). Cervical spine MRI 03/11/2013 (images available, report unavailable). Thyroid ultrasound 07/17/2017. FINDINGS: CT HEAD FINDINGS Brain: Mild cerebral atrophy. Mild patchy and ill-defined hypoattenuation within the cerebral white matter, nonspecific but compatible with chronic small vessel ischemic disease. Prominent perivascular spaces versus chronic lacunar infarcts within the inferior right basal ganglia (series 2, image 11) (series 4, image 39). There is no acute intracranial hemorrhage. No demarcated cortical infarct. No extra-axial fluid collection. No evidence of an intracranial mass. No midline shift. Vascular: No hyperdense vessel.  Atherosclerotic calcifications. Skull: Normal. Negative for fracture or focal lesion. Sinuses/Orbits: Visualized orbits show no acute finding. Small fluid level within the left sphenoid sinus. CT CERVICAL SPINE FINDINGS Alignment: 3 mm C7-T1 grade 1 anterolisthesis. Partially imaged thoracic dextrocurvature. Skull base and vertebrae: The basion-dental and atlanto-dental intervals are maintained.No evidence of acute fracture to the cervical spine. Prior C4-C7 ACDF. No evidence of acute hardware compromise. Soft tissues and spinal canal: No prevertebral fluid or swelling. No visible canal hematoma. Disc levels: Prior C4-C7 ACDF. Cervical spondylosis. Multilevel disc space narrowing at the non operative levels. Multilevel disc bulges/small central disc protrusions, endplate spurring, uncovertebral hypertrophy and facet arthrosis. No appreciable high-grade spinal canal stenosis. Multilevel bony neural foraminal narrowing. Upper chest: No consolidation within the imaged lung apices. No visible pneumothorax. Other:  Redemonstrated multinodular thyroid gland. The thyroid was previously evaluated by ultrasound 07/17/2017. By report, the dominant 3 cm nodule within the inferior left thyroid lobe has been previously biopsied. IMPRESSION: CT head: 1. No evidence of acute intracranial abnormality. 2. Prominent perivascular spaces versus chronic lacunar infarcts within the inferior basal ganglia, bilaterally. 3. Mild chronic small vessel ischemic changes within  the cerebral white matter. 4. Mild cerebral atrophy. 5. Left sphenoid sinusitis. CT cervical spine: 1. No evidence of acute fracture to the cervical spine. 2. 3 mm C7-T1 grade 1 anterolisthesis. 3. Prior C4-C7 ACDF.  No evidence of acute hardware compromise. 4. Cervical spondylosis, as described. Electronically Signed   By: Kellie Simmering D.O.   On: 02/23/2021 15:45   CT Cervical Spine Wo Contrast  Result Date: 02/23/2021 CLINICAL DATA:  Neck trauma. Head trauma, moderate/severe. Additional history provided: Fall (hitting head). Dizziness. EXAM: CT HEAD WITHOUT CONTRAST CT CERVICAL SPINE WITHOUT CONTRAST TECHNIQUE: Multidetector CT imaging of the head and cervical spine was performed following the standard protocol without intravenous contrast. Multiplanar CT image reconstructions of the cervical spine were also generated. RADIATION DOSE REDUCTION: This exam was performed according to the departmental dose-optimization program which includes automated exposure control, adjustment of the mA and/or kV according to patient size and/or use of iterative reconstruction technique. COMPARISON:  Brain MRI 09/06/2015 (images available, report unavailable). Cervical spine MRI 03/11/2013 (images available, report unavailable). Thyroid ultrasound 07/17/2017. FINDINGS: CT HEAD FINDINGS Brain: Mild cerebral atrophy. Mild patchy and ill-defined hypoattenuation within the cerebral white matter, nonspecific but compatible with chronic small vessel ischemic disease. Prominent perivascular  spaces versus chronic lacunar infarcts within the inferior right basal ganglia (series 2, image 11) (series 4, image 39). There is no acute intracranial hemorrhage. No demarcated cortical infarct. No extra-axial fluid collection. No evidence of an intracranial mass. No midline shift. Vascular: No hyperdense vessel.  Atherosclerotic calcifications. Skull: Normal. Negative for fracture or focal lesion. Sinuses/Orbits: Visualized orbits show no acute finding. Small fluid level within the left sphenoid sinus. CT CERVICAL SPINE FINDINGS Alignment: 3 mm C7-T1 grade 1 anterolisthesis. Partially imaged thoracic dextrocurvature. Skull base and vertebrae: The basion-dental and atlanto-dental intervals are maintained.No evidence of acute fracture to the cervical spine. Prior C4-C7 ACDF. No evidence of acute hardware compromise. Soft tissues and spinal canal: No prevertebral fluid or swelling. No visible canal hematoma. Disc levels: Prior C4-C7 ACDF. Cervical spondylosis. Multilevel disc space narrowing at the non operative levels. Multilevel disc bulges/small central disc protrusions, endplate spurring, uncovertebral hypertrophy and facet arthrosis. No appreciable high-grade spinal canal stenosis. Multilevel bony neural foraminal narrowing. Upper chest: No consolidation within the imaged lung apices. No visible pneumothorax. Other: Redemonstrated multinodular thyroid gland. The thyroid was previously evaluated by ultrasound 07/17/2017. By report, the dominant 3 cm nodule within the inferior left thyroid lobe has been previously biopsied. IMPRESSION: CT head: 1. No evidence of acute intracranial abnormality. 2. Prominent perivascular spaces versus chronic lacunar infarcts within the inferior basal ganglia, bilaterally. 3. Mild chronic small vessel ischemic changes within the cerebral white matter. 4. Mild cerebral atrophy. 5. Left sphenoid sinusitis. CT cervical spine: 1. No evidence of acute fracture to the cervical spine. 2.  3 mm C7-T1 grade 1 anterolisthesis. 3. Prior C4-C7 ACDF.  No evidence of acute hardware compromise. 4. Cervical spondylosis, as described. Electronically Signed   By: Kellie Simmering D.O.   On: 02/23/2021 15:45    Procedures Procedures    Medications Ordered in ED Medications - No data to display  ED Course/ Medical Decision Making/ A&P                           Medical Decision Making   CC: Fall, head and neck pain  This patient complains of above; this involves an extensive number of treatment options and is a complaint that carries  with it a high risk of complications and morbidity. Vital signs were reviewed. Serious etiologies considered.  Record review:  Previous records obtained and reviewed   Additional history obtained from daughter  Work up as above, notable for:  imaging results that were available during my care of the patient were reviewed by me and considered in my medical decision making.   I ordered imaging studies which included CT head, CT cervical spine and I independently visualized and interpreted imaging which showed chronic changes, no acute fracture or infarct.  No acute cervical spine fracture  Reassessment:  Patient sitting comfortably in the bed.  No acute discomfort at this time.  She is ambulatory with steady gait.  Neurologic exam remained nonfocal.   Patient presents with low mechanism head trauma. On initial evaluation patient appears in no acute distress, afebrile with normal vital signs. Patient has completely intact neurovascular exam, and pain improved in ED. CT imaging obtained and is nonacute.  Discussed possible etiology of concussion, signs and symptoms, and discharge instructions. Detailed discussions were had with the patient regarding current findings, and need for close f/u with PCP or on call doctor. The patient has been instructed to return immediately if the symptoms worsen in any way for re-evaluation.  Discussed concussion precautions  with patient and daughter at bedside.  Patient verbalized understanding and is in agreement with current care plan. All questions answered prior to discharge   Cardiac monitor reviewed by myself demonstrates normal sinus rhythm.     This chart was dictated using voice recognition software.  Despite best efforts to proofread,  errors can occur which can change the documentation meaning.  Final Clinical Impression(s) / ED Diagnoses Final diagnoses:  Fall, initial encounter  Closed head injury, initial encounter    Rx / DC Orders ED Discharge Orders     None         Jeanell Sparrow, DO 02/23/21 1716

## 2021-02-23 NOTE — ED Triage Notes (Addendum)
Was in garage this am and her feet got tangled up and she fell hitting her head , bilateral elbow pain , back and knee pain , no lOC but has been dizzy since, denies bloodthinners

## 2021-02-23 NOTE — ED Notes (Signed)
States tripped and fell backwards hitting head.  Denies LOC but states felt dizzy afterwards.  No swelling or laceration noted.  States some pain to back of head and to neck

## 2021-02-24 DIAGNOSIS — W19XXXD Unspecified fall, subsequent encounter: Secondary | ICD-10-CM | POA: Diagnosis not present

## 2021-02-24 DIAGNOSIS — M542 Cervicalgia: Secondary | ICD-10-CM | POA: Diagnosis not present

## 2021-03-01 ENCOUNTER — Ambulatory Visit
Admission: RE | Admit: 2021-03-01 | Discharge: 2021-03-01 | Disposition: A | Discharge status: Home / Self Care | Payer: Medicare Other | Source: Ambulatory Visit | Attending: Surgery | Admitting: Surgery

## 2021-03-01 ENCOUNTER — Other Ambulatory Visit: Payer: Self-pay

## 2021-03-01 ENCOUNTER — Encounter: Payer: Self-pay | Admitting: Surgery

## 2021-03-01 ENCOUNTER — Ambulatory Visit (INDEPENDENT_AMBULATORY_CARE_PROVIDER_SITE_OTHER): Payer: Medicare Other | Admitting: Surgery

## 2021-03-01 VITALS — BP 148/71 | HR 78 | Resp 20 | Ht 63.0 in | Wt 112.0 lb

## 2021-03-01 DIAGNOSIS — I712 Thoracic aortic aneurysm, without rupture, unspecified: Secondary | ICD-10-CM | POA: Diagnosis not present

## 2021-03-01 DIAGNOSIS — I7121 Aneurysm of the ascending aorta, without rupture: Secondary | ICD-10-CM

## 2021-03-01 DIAGNOSIS — I2510 Atherosclerotic heart disease of native coronary artery without angina pectoris: Secondary | ICD-10-CM | POA: Diagnosis not present

## 2021-03-01 MED ORDER — IOPAMIDOL (ISOVUE-370) INJECTION 76%
75.0000 mL | Freq: Once | INTRAVENOUS | Status: AC | PRN
  Administered 2021-03-01: 75 mL via INTRAVENOUS

## 2021-03-01 NOTE — Progress Notes (Signed)
HPI:  The patient is an 87 year old woman with a history of a stable 5.5 cm fusiform ascending aortic aneurysm that we have decided to continue following due to her advanced age and mild neurocognitive dysfunction.  This aneurysm extends up into the aortic arch and would require circulatory arrest for partial arch replacement.  Her last echocardiogram on 01/12/2020 showed a severely calcified aortic valve with mild stenosis and moderate regurgitation with a pressure half-time of 264 ms.  She continues to feel well overall without chest pain or shortness of breath.  She was recently seen in the emergency room on 02/23/2021 after a fall in her garage.  She did hit her head and her knees and may have suffered a concussion.  CT of the head showed mild cerebral atrophy and chronic small vessel ischemic changes with a question of chronic lacunar infarcts bilaterally but no acute abnormality.  Current Outpatient Medications  Medication Sig Dispense Refill   BRIMONIDINE TARTRATE EX Apply topically.     Calcium Carbonate-Vitamin D (CALCIUM PLUS VITAMIN D PO) Take 1 tablet by mouth daily.     Coenzyme Q10 (CO Q 10) 100 MG CAPS Take 1 tablet by mouth at bedtime.      ibandronate (BONIVA) 150 MG tablet Take 150 mg by mouth every 30 (thirty) days.      Ibuprofen 200 MG CAPS Take 400 mg by mouth daily as needed (pain.).     Magnesium 200 MG TABS Take 1 tablet by mouth daily.     Omega-3 Fatty Acids (FISH OIL) 1200 MG CAPS Take 1 capsule by mouth daily.      Probiotic Product (PROBIOTIC PO) Take 1 capsule by mouth at bedtime. Ultra Flora Balance.     rivastigmine (EXELON) 1.5 MG capsule TAKE 1 CAPSULE EACH MORNING. 90 capsule 4   rivastigmine (EXELON) 3 MG capsule Take 1 capsule (3 mg total) by mouth at bedtime. 90 capsule 4   rosuvastatin (CRESTOR) 10 MG tablet TAKE 1 TABLET BY MOUTH EVERY DAY 90 tablet 3   Rotigotine (NEUPRO) 1 MG/24HR PT24 Place 1 patch (1 mg total) onto the skin at bedtime. 90 patch 4    methimazole (TAPAZOLE) 5 MG tablet Take 5 mg by mouth every other day.     No current facility-administered medications for this visit.     Physical Exam: BP (!) 148/71 (BP Location: Right Arm, Patient Position: Sitting)    Pulse 78    Resp 20    Ht 5\' 3"  (1.6 m)    Wt 112 lb (50.8 kg)    LMP  (LMP Unknown)    SpO2 96% Comment: RA   BMI 19.84 kg/m  She looks well. Cardiac exam shows a regular rate and rhythm with a 2/6 systolic and 2/6 diastolic murmur along the right sternal border. Lungs are clear. There is no peripheral edema  Diagnostic Tests:  Narrative & Impression  CLINICAL DATA:  TAA follow-up   EXAM: CT ANGIOGRAPHY CHEST WITH CONTRAST   TECHNIQUE: Multidetector CT imaging of the chest was performed using the standard protocol during bolus administration of intravenous contrast. Multiplanar CT image reconstructions and MIPs were obtained to evaluate the vascular anatomy.   RADIATION DOSE REDUCTION: This exam was performed according to the departmental dose-optimization program which includes automated exposure control, adjustment of the mA and/or kV according to patient size and/or use of iterative reconstruction technique.   CONTRAST:  62mL ISOVUE-370 IOPAMIDOL (ISOVUE-370) INJECTION 76%   COMPARISON:  08/17/2020   FINDINGS:  Cardiovascular: Preferential opacification of the thoracic aorta. Unchanged aneurysmal dilatation of the tubular ascending thoracic aorta, measuring up to 5.6 x 5.6 cm in caliber. The aortic valve measures up to 2.9 cm. The sinuses of Valsalva measure up to 3.6 cm. The descending thoracic aorta is normal in caliber measuring up to 2.8 x 2.7 cm. Moderate mixed calcific atherosclerosis. Cardiomegaly. Left coronary artery calcifications. No pericardial effusion.   Mediastinum/Nodes: No enlarged mediastinal, hilar, or axillary lymph nodes. Thyroid gland, trachea, and esophagus demonstrate no significant findings.   Lungs/Pleura: Lungs are  clear. No pleural effusion or pneumothorax.   Upper Abdomen: No acute abnormality.   Musculoskeletal: No chest wall abnormality. No acute osseous findings.   Review of the MIP images confirms the above findings.   IMPRESSION: 1. Unchanged aneurysmal dilatation of the tubular ascending thoracic aorta, measuring up to 5.6 x 5.6 cm in caliber. Recommend semi-annual imaging followup by CTA or MRA and referral to cardiothoracic surgery if not already obtained. This recommendation follows 2010 ACCF/AHA/AATS/ACR/ASA/SCA/SCAI/SIR/STS/SVM Guidelines for the Diagnosis and Management of Patients With Thoracic Aortic Disease. Circulation. 2010; 121: [id number]. Aortic aneurysm NOS (ICD10-I71.9) 2. Cardiomegaly and coronary artery disease.   Aortic Atherosclerosis (ICD10-I70.0).     Electronically Signed   By: Delanna Ahmadi M.D.   On: 03/01/2021 10:29      Impression:  This 87 year old woman has a stable 5.6 cm fusiform ascending aortic aneurysm that extends from the proximal ascending aorta up to the mid aortic arch beyond the origin of the innominate and left carotid arteries.  It does not appear significantly changed from her previous CTA of the chest in July 2022.  She also has mild aortic stenosis and moderate aortic insufficiency by echo in November 2021.  She is asymptomatic I reviewed the images with her and her daughter and answered their questions.  We discussed the high risk nature of surgical treatment of this problem in this particular patient given her advanced age and degree of neurocognitive dysfunction.  They understand that her risk of aortic dissection is probably in the range of 6 to 8 %/year.  They are in agreement with continuing follow-up and nonsurgical treatment with good blood pressure control.  All their questions have been answered.  I have recommended a follow-up CTA of the chest in 6 months.  She should have a repeat echocardiogram at some point to follow-up on her  aortic stenosis and aortic insufficiency although she remains asymptomatic at this time.  Plan:  I will see her back in 6 months with a CTA of the chest  I spent 20 minutes performing this established patient evaluation and > 50% of this time was spent face to face counseling and coordinating the care of this patient's aortic aneurysm.    Gaye Pollack, MD [facility] [phone]

## 2021-03-06 DIAGNOSIS — E042 Nontoxic multinodular goiter: Secondary | ICD-10-CM | POA: Diagnosis not present

## 2021-03-06 DIAGNOSIS — Z681 Body mass index (BMI) 19 or less, adult: Secondary | ICD-10-CM | POA: Diagnosis not present

## 2021-03-06 DIAGNOSIS — R634 Abnormal weight loss: Secondary | ICD-10-CM | POA: Diagnosis not present

## 2021-03-06 DIAGNOSIS — E049 Nontoxic goiter, unspecified: Secondary | ICD-10-CM | POA: Diagnosis not present

## 2021-03-06 DIAGNOSIS — R7989 Other specified abnormal findings of blood chemistry: Secondary | ICD-10-CM | POA: Diagnosis not present

## 2021-03-06 DIAGNOSIS — H10439 Chronic follicular conjunctivitis, unspecified eye: Secondary | ICD-10-CM | POA: Insufficient documentation

## 2021-05-23 DIAGNOSIS — M25562 Pain in left knee: Secondary | ICD-10-CM | POA: Diagnosis not present

## 2021-06-07 DIAGNOSIS — H401422 Capsular glaucoma with pseudoexfoliation of lens, left eye, moderate stage: Secondary | ICD-10-CM | POA: Diagnosis not present

## 2021-06-19 DIAGNOSIS — H524 Presbyopia: Secondary | ICD-10-CM | POA: Diagnosis not present

## 2021-06-19 DIAGNOSIS — H5203 Hypermetropia, bilateral: Secondary | ICD-10-CM | POA: Diagnosis not present

## 2021-06-19 DIAGNOSIS — H52203 Unspecified astigmatism, bilateral: Secondary | ICD-10-CM | POA: Diagnosis not present

## 2021-07-19 ENCOUNTER — Other Ambulatory Visit: Payer: Self-pay | Admitting: Surgery

## 2021-07-19 DIAGNOSIS — R634 Abnormal weight loss: Secondary | ICD-10-CM | POA: Diagnosis not present

## 2021-07-19 DIAGNOSIS — Z8679 Personal history of other diseases of the circulatory system: Secondary | ICD-10-CM | POA: Diagnosis not present

## 2021-07-19 DIAGNOSIS — E042 Nontoxic multinodular goiter: Secondary | ICD-10-CM | POA: Diagnosis not present

## 2021-07-19 DIAGNOSIS — E559 Vitamin D deficiency, unspecified: Secondary | ICD-10-CM | POA: Diagnosis not present

## 2021-07-19 DIAGNOSIS — I7121 Aneurysm of the ascending aorta, without rupture: Secondary | ICD-10-CM

## 2021-07-19 DIAGNOSIS — R413 Other amnesia: Secondary | ICD-10-CM | POA: Diagnosis not present

## 2021-08-17 DIAGNOSIS — R634 Abnormal weight loss: Secondary | ICD-10-CM | POA: Diagnosis not present

## 2021-08-17 DIAGNOSIS — R413 Other amnesia: Secondary | ICD-10-CM | POA: Diagnosis not present

## 2021-08-17 DIAGNOSIS — M7581 Other shoulder lesions, right shoulder: Secondary | ICD-10-CM | POA: Diagnosis not present

## 2021-10-04 ENCOUNTER — Ambulatory Visit
Admission: RE | Admit: 2021-10-04 | Discharge: 2021-10-04 | Disposition: A | Discharge status: Home / Self Care | Payer: Medicare Other | Source: Ambulatory Visit | Attending: Surgery | Admitting: Surgery

## 2021-10-04 ENCOUNTER — Ambulatory Visit: Payer: Medicare Other | Admitting: Surgery

## 2021-10-04 ENCOUNTER — Encounter: Payer: Self-pay | Admitting: Surgery

## 2021-10-04 VITALS — BP 150/70 | HR 72 | Resp 20 | Ht 63.0 in | Wt 111.0 lb

## 2021-10-04 DIAGNOSIS — I7121 Aneurysm of the ascending aorta, without rupture: Secondary | ICD-10-CM

## 2021-10-04 DIAGNOSIS — I2510 Atherosclerotic heart disease of native coronary artery without angina pectoris: Secondary | ICD-10-CM | POA: Diagnosis not present

## 2021-10-04 DIAGNOSIS — J9811 Atelectasis: Secondary | ICD-10-CM | POA: Diagnosis not present

## 2021-10-04 DIAGNOSIS — I712 Thoracic aortic aneurysm, without rupture, unspecified: Secondary | ICD-10-CM | POA: Diagnosis not present

## 2021-10-04 DIAGNOSIS — I517 Cardiomegaly: Secondary | ICD-10-CM | POA: Diagnosis not present

## 2021-10-04 MED ORDER — IOPAMIDOL (ISOVUE-370) INJECTION 76%
75.0000 mL | Freq: Once | INTRAVENOUS | Status: AC | PRN
  Administered 2021-10-04: 75 mL via INTRAVENOUS

## 2021-10-04 NOTE — Progress Notes (Signed)
HPI:  The patient is an 87 year old woman with a history of a stable 5.6 cm fusiform ascending aortic aneurysm that we have decided to continue following due to her advanced age and mild neurocognitive dysfunction.  This aneurysm extends up into the aortic arch and would require circulatory arrest for partial arch replacement.  Her last echocardiogram on 01/12/2020 showed a severely calcified aortic valve with mild stenosis and moderate regurgitation with a pressure half-time of 264 ms.  Since I saw her in January 2023 she continues to feel well overall without chest pain or shortness of breath.   Current Outpatient Medications  Medication Sig Dispense Refill   Calcium Carbonate-Vitamin D (CALCIUM PLUS VITAMIN D PO) Take 1 tablet by mouth daily.     Coenzyme Q10 (CO Q 10) 100 MG CAPS Take 1 tablet by mouth at bedtime.      ibandronate (BONIVA) 150 MG tablet Take 150 mg by mouth every 30 (thirty) days.      Ibuprofen 200 MG CAPS Take 400 mg by mouth daily as needed (pain.).     methimazole (TAPAZOLE) 5 MG tablet Take 5 mg by mouth every other day.     Omega-3 Fatty Acids (FISH OIL) 1200 MG CAPS Take 1 capsule by mouth daily.      rivastigmine (EXELON) 1.5 MG capsule TAKE 1 CAPSULE EACH MORNING. 90 capsule 4   rosuvastatin (CRESTOR) 10 MG tablet TAKE 1 TABLET BY MOUTH EVERY DAY 90 tablet 3   BRIMONIDINE TARTRATE EX Apply topically. (Patient not taking: Reported on 10/04/2021)     Magnesium 200 MG TABS Take 1 tablet by mouth daily. (Patient not taking: Reported on 10/04/2021)     Probiotic Product (PROBIOTIC PO) Take 1 capsule by mouth at bedtime. Ultra Flora Balance. (Patient not taking: Reported on 10/04/2021)     rivastigmine (EXELON) 3 MG capsule Take 1 capsule (3 mg total) by mouth at bedtime. (Patient not taking: Reported on 10/04/2021) 90 capsule 4   Rotigotine (NEUPRO) 1 MG/24HR PT24 Place 1 patch (1 mg total) onto the skin at bedtime. (Patient not taking: Reported on 10/04/2021) 90 patch 4    No current facility-administered medications for this visit.     Physical Exam: BP (!) 150/70   Pulse 72   Resp 20   Ht '5\' 3"'$  (1.6 m)   Wt 111 lb (50.3 kg)   LMP  (LMP Unknown)   SpO2 95% Comment: RA  BMI 19.66 kg/m  She looks well. Cardiac exam shows regular rate and rhythm with a 2/6 systolic and diastolic murmur along the right sternal border. Lungs are clear. There is no peripheral edema.  Diagnostic Tests:  Narrative & Impression  CLINICAL DATA:  Follow-up aortic aneurysm   EXAM: CT ANGIOGRAPHY CHEST WITH CONTRAST   TECHNIQUE: Multidetector CT imaging of the chest was performed using the standard protocol during bolus administration of intravenous contrast. Multiplanar CT image reconstructions and MIPs were obtained to evaluate the vascular anatomy.   RADIATION DOSE REDUCTION: This exam was performed according to the departmental dose-optimization program which includes automated exposure control, adjustment of the mA and/or kV according to patient size and/or use of iterative reconstruction technique.   CONTRAST:  23m ISOVUE-370 IOPAMIDOL (ISOVUE-370) INJECTION 76%   COMPARISON:  CTA chest dated March 01, 2021   FINDINGS: Cardiovascular: Ascending aortic aneurysm measuring 5.7 x 5.6 cm maximum dimension, unchanged in size when compared with prior exam and remeasured in similar plane. Dilation extends from the sino-tubular junction to  the proximal aortic arch involving the common origin of the right subclavian artery and common carotid artery. Moderate calcified and noncalcified plaque of the thoracic aorta. Cardiomegaly. Trace pericardial fluid.   Mediastinum/Nodes: Esophagus unremarkable. Stable multinodular thyroid goiter. No pathologically enlarged lymph nodes seen in the chest.   Lungs/Pleura: Central airways are patent. No consolidation, pleural effusion or pneumothorax. Bibasilar atelectasis.   Upper Abdomen: New rounded enhancing structure  seen in the upper abdomen adjacent to the GE junction measuring 1.1 x 1.0 cm on series 7, image 130, enhances the same as descending thoracic aorta and appears to have a small feeding vessel.   Musculoskeletal: Prior ACDF of the cervical spine. No aggressive appearing osseous lesions.   Review of the MIP images confirms the above findings.   IMPRESSION: 1. Ascending aortic aneurysm measuring up to 5.7 cm, unchanged in size when compared with prior exam. Recommend semi-annual imaging followup by CTA or MRA and referral to cardiothoracic surgery if not already obtained. This recommendation follows 2010 ACCF/AHA/AATS/ACR/ASA/SCA/SCAI/SIR/STS/SVM Guidelines for the Diagnosis and Management of Patients With Thoracic Aortic Disease. Circulation. 2010; 121: E266-e369TAA. Aortic aneurysm NOS (ICD10-I71.9) 2. 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 If patient is symptomatic, suggest IR consult for possible vascular intervention. Otherwise, recommend attention on follow-up as above. 3. Aortic Atherosclerosis (ICD10-I70.0).     Electronically Signed   By: Yetta Glassman M.D.   On: 10/04/2021 10:19      Impression:  She has a stable 5.6 x 5.7 cm fusiform ascending aortic aneurysm extending from the sinotubular junction up to the mid aortic arch beyond the left common carotid artery.  This is not changed significantly compared to her previous scan in January 2023.  I reviewed the CTA images with her and her daughter and answered all their questions.  Her aneurysm has reached the usual surgical threshold but given her advanced age I think it would be best to continue conservative treatment with blood pressure control and observation.  I think the likelihood that she will get through surgical repair and back to being independent and functional would be  relatively low.  Plan:  I will plan to see her back in 1 year with a CTA of the chest.  I spent 20 minutes performing this established patient evaluation and > 50% of this time was spent face to face counseling and coordinating the care of this patient's aortic aneurysm.    Gaye Pollack, MD [facility] [phone]

## 2021-10-05 DIAGNOSIS — H401422 Capsular glaucoma with pseudoexfoliation of lens, left eye, moderate stage: Secondary | ICD-10-CM | POA: Diagnosis not present

## 2021-10-15 NOTE — Progress Notes (Addendum)
Office Visit    Patient Name: Janet Wilson Date of Encounter: 10/15/2021  Primary Care Provider:  Josetta Huddle, MD Primary Cardiologist:  None Primary Electrophysiologist: None  Chief Complaint    Janet Wilson is a 87 y.o. female with PMH of ascending aortic aneurysm, LBBB, atrial tachycardia, moderate aortic regurgitation, nonobstructive CAD, GERD who presents today for follow-up of aortic aneurysm and left bundle branch block.  Past Medical History    Past Medical History:  Diagnosis Date   Arthritis    OA BOTH KNEES AND HANDS   Atrial tachycardia (HCC)    HX OF   GERD (gastroesophageal reflux disease)    Glaucoma    left eye   Goiter    CAUSING COUGH, HOARSINESS AND DRY THROAT   Headache(784.0)    MIGRAINES    Overactive bladder    Temporal arteritis (HCC)    Past Surgical History:  Procedure Laterality Date   ARTERY BIOPSY Right 07/21/2012   Procedure: BIOPSY TEMPORAL ARTERY;  Surgeon: Haywood Lasso, MD;  Location: [facility];  Service: General;  Laterality: Right;   CERVICAL FUSION  2012   Dr. Vertell Limber   EYE SURGERY     CATARACT EXTRACTION- BILATERAL   INCONTINENCE SURGERY     JOINT REPLACEMENT     right knee   LEFT SHOULDER SURGERY     RIGHT KNEE ARTHROSCOPY  11/2009     ROBOTIC ASSISTED BILATERAL SALPINGO OOPHERECTOMY Bilateral 12/08/2013   Procedure: ROBOTIC ASSISTED LAPAROSCOPIC BILATERAL SALPINGO OOPHORECTOMY WITH STAGING;  Surgeon: Everitt Amber, MD;  Location: [facility];  Service: Gynecology;  Laterality: Bilateral;   TOTAL KNEE ARTHROPLASTY  07/04/2011   Procedure: TOTAL KNEE ARTHROPLASTY;  Surgeon: Gearlean Alf, MD;  Location: [facility];  Service: Orthopedics;  Laterality: Right;    Allergies  Allergies  Allergen Reactions   Oxycodone Anaphylaxis   Mold Extract [Trichophyton]     headache migraine   Molds & Smuts     Other reaction(s): Other (See Comments)   Codeine Nausea Only    History of Present Illness     Janet Wilson is a 87 year old female with the above-mentioned past medical history who was originally seen by Dr. Marlou Porch in 2013 for complaint of tachycardia.   Patient had previously wore event monitor when seen by [facility] in 2017  that showed short runs of SVT and no evidence of AF.  She was found to have new left bundle branch block.  She denied any chest pain or shortness of breath with tachycardia.    She underwent nuclear stress test that showed no areas of ischemia or previous infarct.  2D echo was completed in 2021 that showed moderate aortic valve sclerosis with mild aortic regurgitation and EF of 55-60%.  There was severe a sending aortic dilation noted.  She was referred to CVTS and is currently being followed by Dr. Cyndia Bent.  Patient also requested second opinion from Dr. Ysidro Evert at [facility].  Patient and family ultimately decided against surgical repair of aorta.  CT angio of the chest completed 09/2021 revealing unchanged aneurysm size from previous study.   Janet Wilson presents today with her husband for annual follow-up.  Since last being seen in the office patient reports she has been doing well from cardiac perspective.  She has noticed her blood pressures have been increased at home and today are 138/78.  She is currently compliant with medications and reports no side effects. patient denies chest pain, palpitations, dyspnea,  PND, orthopnea, nausea, vomiting, dizziness, syncope, edema, weight gain, or early satiety.  Home Medications    Current Outpatient Medications  Medication Sig Dispense Refill   BRIMONIDINE TARTRATE EX Apply topically. (Patient not taking: Reported on 10/04/2021)     Calcium Carbonate-Vitamin D (CALCIUM PLUS VITAMIN D PO) Take 1 tablet by mouth daily.     Coenzyme Q10 (CO Q 10) 100 MG CAPS Take 1 tablet by mouth at bedtime.      ibandronate (BONIVA) 150 MG tablet Take 150 mg by mouth every 30 (thirty) days.      Ibuprofen 200 MG  CAPS Take 400 mg by mouth daily as needed (pain.).     Magnesium 200 MG TABS Take 1 tablet by mouth daily. (Patient not taking: Reported on 10/04/2021)     methimazole (TAPAZOLE) 5 MG tablet Take 5 mg by mouth every other day.     Omega-3 Fatty Acids (FISH OIL) 1200 MG CAPS Take 1 capsule by mouth daily.      Probiotic Product (PROBIOTIC PO) Take 1 capsule by mouth at bedtime. Ultra Flora Balance. (Patient not taking: Reported on 10/04/2021)     rivastigmine (EXELON) 1.5 MG capsule TAKE 1 CAPSULE EACH MORNING. 90 capsule 4   rivastigmine (EXELON) 3 MG capsule Take 1 capsule (3 mg total) by mouth at bedtime. (Patient not taking: Reported on 10/04/2021) 90 capsule 4   rosuvastatin (CRESTOR) 10 MG tablet TAKE 1 TABLET BY MOUTH EVERY DAY 90 tablet 3   Rotigotine (NEUPRO) 1 MG/24HR PT24 Place 1 patch (1 mg total) onto the skin at bedtime. (Patient not taking: Reported on 10/04/2021) 90 patch 4   No current facility-administered medications for this visit.     Review of Systems  Please see the history of present illness.     All other systems reviewed and are otherwise negative except as noted above.  Physical Exam    Wt Readings from Last 3 Encounters:  10/04/21 111 lb (50.3 kg)  03/01/21 112 lb (50.8 kg)  02/23/21 112 lb (50.8 kg)   AT:FTDDU were no vitals filed for this visit.,There is no height or weight on file to calculate BMI.  Constitutional:      Appearance: Healthy appearance. Not in distress.  Neck:     Vascular: JVD normal.  Pulmonary:     Effort: Pulmonary effort is normal.     Breath sounds: No wheezing. No rales. Diminished in the bases Cardiovascular:     Normal rate. Regular rhythm. Normal S1. Normal S2.      Murmurs: There is no murmur.  Edema:    Peripheral edema absent.  Abdominal:     Palpations: Abdomen is soft non tender. There is no hepatomegaly.  Skin:    General: Skin is warm and dry.  Neurological:     General: No focal deficit present.     Mental Status:  Alert and oriented to person, place and time.     Cranial Nerves: Cranial nerves are intact.  EKG/LABS/Other Studies Reviewed    ECG personally reviewed by me today -sinus rhythm with left axis deviation and left bundle branch block with rate of 88 bpm and no acute changes compared to previous EKG    Lab Results  Component Value Date   WBC 5.3 03/04/2020   HGB 14.3 03/04/2020   HCT 45.8 03/04/2020   MCV 98.5 03/04/2020   PLT 172 03/04/2020   Lab Results  Component Value Date   CREATININE 0.75 03/04/2020   BUN 21  03/04/2020   NA 139 03/04/2020   K 4.2 03/04/2020   CL 105 03/04/2020   CO2 24 03/04/2020   Lab Results  Component Value Date   ALT 14 03/18/2020   AST 24 03/04/2020   ALKPHOS 63 03/04/2020   BILITOT 0.9 03/04/2020   Lab Results  Component Value Date   CHOL 134 03/18/2020   HDL 67 03/18/2020   LDLCALC 57 03/18/2020   TRIG 39 03/18/2020   CHOLHDL 2.0 03/18/2020    Lab Results  Component Value Date   HGBA1C 5.4 05/21/2014    Assessment & Plan    1.  Aortic atherosclerosis /dilated ascending aortic aneurysm: -Currently followed by Dr. Cyndia Bent -Recent MRI completed 09/2021 that showed no change to aneurysm -Continue GDMT with Crestor 10 mg -Blood pressure today was slightly elevated at 138/78 -We discussed the importance of abstaining from strenuous lifting in setting of aneurysm  2.  Nonobstructive CAD: -Last ischemic evaluation completed 2013 by [facility] with no abnormalities. -Patient reports today no chest pain -Patient advised to begin 81 mg ASA for GDMT  3.  LBBB: -2D echo was completed 2021 with EF of 50-60% and grade 1 DD -Today patient is asymptomatic with no complaints of dizziness or palpitations  4.  Atrial tachycardia: -Previous event monitor worn 2017 at [facility] -Patient reports today no palpitations since previous visit  5.  Essential hypertension: -Patient's blood pressures have remained elevated and today was 138/78 -Patient  will start lisinopril 20 mg today -BMET in 2 weeks -Patient instructed to check and monitor blood pressures for the next 2 weeks     Disposition: Follow-up with None or APP in 1 months    Medication Adjustments/Labs and Tests Ordered: Current medicines are reviewed at length with the patient today.  Concerns regarding medicines are outlined above.   Signed, Mable Fill, Janet Nestle, Janet Wilson 10/15/2021, 11:56 AM [place]

## 2021-10-17 ENCOUNTER — Encounter: Payer: Self-pay | Admitting: Nurse Practitioner

## 2021-10-17 ENCOUNTER — Ambulatory Visit: Payer: Medicare Other | Attending: Nurse Practitioner | Admitting: Nurse Practitioner

## 2021-10-17 VITALS — BP 138/78 | HR 88 | Ht 63.0 in | Wt 110.8 lb

## 2021-10-17 DIAGNOSIS — I700 Atherosclerosis of aorta: Secondary | ICD-10-CM | POA: Diagnosis not present

## 2021-10-17 DIAGNOSIS — R000 Tachycardia, unspecified: Secondary | ICD-10-CM | POA: Diagnosis not present

## 2021-10-17 DIAGNOSIS — I2584 Coronary atherosclerosis due to calcified coronary lesion: Secondary | ICD-10-CM

## 2021-10-17 DIAGNOSIS — I2510 Atherosclerotic heart disease of native coronary artery without angina pectoris: Secondary | ICD-10-CM

## 2021-10-17 DIAGNOSIS — I10 Essential (primary) hypertension: Secondary | ICD-10-CM

## 2021-10-17 MED ORDER — LISINOPRIL 20 MG PO TABS: 20.0000 mg | ORAL_TABLET | Freq: Every day | ORAL | 3 refills | Status: DC

## 2021-10-17 NOTE — Patient Instructions (Addendum)
Medication Instructions:  Your physician has recommended you make the following change in your medication:  Lisinopril '20mg'$  daily *If you need a refill on your cardiac medications before your next appointment, please call your pharmacy*   Lab Work: BMET 2 weeks If you have labs (blood work) drawn today and your tests are completely normal, you will receive your results only by: [place] (if you have MyChart) OR A paper copy in the mail If you have any lab test that is abnormal or we need to change your treatment, we will call you to review the results.   Follow-Up: At [facility], you and your health needs are our priority.  As part of our continuing mission to provide you with exceptional heart care, we have created designated Provider Care Teams.  These Care Teams include your primary Cardiologist (physician) and Advanced Practice Providers (APPs -  Physician Assistants and Nurse Practitioners) who all work together to provide you with the care you need, when you need it.  We recommend signing up for the patient portal called "MyChart".  Sign up information is provided on this After Visit Summary.  MyChart is used to connect with patients for Virtual Visits (Telemedicine).  Patients are able to view lab/test results, encounter notes, upcoming appointments, etc.  Non-urgent messages can be sent to your provider as well.   To learn more about what you can do with MyChart, go to [email or web address].    Your next appointment:   4 week(s)  The format for your next appointment:   In Person  Provider:   Ambrose Pancoast, NP

## 2021-10-18 DIAGNOSIS — M7581 Other shoulder lesions, right shoulder: Secondary | ICD-10-CM | POA: Diagnosis not present

## 2021-10-27 ENCOUNTER — Encounter: Payer: Self-pay | Admitting: Surgery

## 2021-10-31 ENCOUNTER — Ambulatory Visit: Payer: Medicare Other | Attending: Interventional Cardiology

## 2021-10-31 DIAGNOSIS — I2584 Coronary atherosclerosis due to calcified coronary lesion: Secondary | ICD-10-CM | POA: Diagnosis not present

## 2021-10-31 DIAGNOSIS — I2510 Atherosclerotic heart disease of native coronary artery without angina pectoris: Secondary | ICD-10-CM | POA: Diagnosis not present

## 2021-11-01 LAB — BASIC METABOLIC PANEL
BUN/Creatinine Ratio: 29 — ABNORMAL HIGH (ref 12–28)
BUN: 25 mg/dL (ref 8–27)
CO2: 23 mmol/L (ref 20–29)
Calcium: 9.2 mg/dL (ref 8.7–10.3)
Chloride: 103 mmol/L (ref 96–106)
Creatinine, Ser: 0.85 mg/dL (ref 0.57–1.00)
Glucose: 115 mg/dL — ABNORMAL HIGH (ref 70–99)
Potassium: 4.5 mmol/L (ref 3.5–5.2)
Sodium: 140 mmol/L (ref 134–144)
eGFR: 67 mL/min/{1.73_m2} (ref >59)

## 2021-11-02 ENCOUNTER — Telehealth: Payer: Self-pay

## 2021-11-02 ENCOUNTER — Encounter: Payer: Self-pay | Admitting: *Deleted

## 2021-11-02 NOTE — Telephone Encounter (Signed)
Patient's daughter aware of Dr. Vivi Martens recommendations. She did not have any other questions.

## 2021-11-02 NOTE — Telephone Encounter (Signed)
-----   Message from Laury Deep, RN sent at 11/02/2021  2:04 PM EDT ----- Ok great, I will let [facility] know.  Thanks for calling her back.  Ryan ----- Message ----- From: Donnella Sham, RN Sent: 11/02/2021   1:58 PM EDT To: Laury Deep, RN  Just got off the phone with her. She didn't have any other questions.   ----- Message ----- From: Laury Deep, RN Sent: 11/02/2021   1:45 PM EDT To: Donnella Sham, RN  Hey,   Can you my chart or call this patients daughter and let her know that [facility] said nothing is needed at all for this.  Tiny aneurysm that only needs yearly surveillance with CTA like we will order.  This is not causing her to not be gaining any weight.  Thanks, [employer]

## 2021-11-03 DIAGNOSIS — H401422 Capsular glaucoma with pseudoexfoliation of lens, left eye, moderate stage: Secondary | ICD-10-CM | POA: Diagnosis not present

## 2021-11-05 ENCOUNTER — Emergency Department (HOSPITAL_BASED_OUTPATIENT_CLINIC_OR_DEPARTMENT_OTHER)
Admission: EM | Admit: 2021-11-05 | Discharge: 2021-11-06 | Disposition: A | Discharge status: Home / Self Care | Payer: Medicare Other | Attending: Emergency Medicine | Admitting: Emergency Medicine

## 2021-11-05 ENCOUNTER — Emergency Department (HOSPITAL_BASED_OUTPATIENT_CLINIC_OR_DEPARTMENT_OTHER): Payer: Medicare Other

## 2021-11-05 ENCOUNTER — Other Ambulatory Visit: Payer: Self-pay | Admitting: Neurology

## 2021-11-05 ENCOUNTER — Encounter (HOSPITAL_BASED_OUTPATIENT_CLINIC_OR_DEPARTMENT_OTHER): Payer: Self-pay | Admitting: Emergency Medicine

## 2021-11-05 ENCOUNTER — Other Ambulatory Visit: Payer: Self-pay

## 2021-11-05 DIAGNOSIS — I2510 Atherosclerotic heart disease of native coronary artery without angina pectoris: Secondary | ICD-10-CM | POA: Diagnosis not present

## 2021-11-05 DIAGNOSIS — N390 Urinary tract infection, site not specified: Secondary | ICD-10-CM

## 2021-11-05 DIAGNOSIS — Z20822 Contact with and (suspected) exposure to covid-19: Secondary | ICD-10-CM | POA: Diagnosis not present

## 2021-11-05 DIAGNOSIS — I959 Hypotension, unspecified: Secondary | ICD-10-CM

## 2021-11-05 DIAGNOSIS — Z743 Need for continuous supervision: Secondary | ICD-10-CM | POA: Diagnosis not present

## 2021-11-05 DIAGNOSIS — T464X5A Adverse effect of angiotensin-converting-enzyme inhibitors, initial encounter: Secondary | ICD-10-CM | POA: Diagnosis not present

## 2021-11-05 DIAGNOSIS — R059 Cough, unspecified: Secondary | ICD-10-CM | POA: Insufficient documentation

## 2021-11-05 DIAGNOSIS — R6889 Other general symptoms and signs: Secondary | ICD-10-CM | POA: Diagnosis not present

## 2021-11-05 DIAGNOSIS — R42 Dizziness and giddiness: Secondary | ICD-10-CM | POA: Diagnosis present

## 2021-11-05 DIAGNOSIS — T887XXA Unspecified adverse effect of drug or medicament, initial encounter: Secondary | ICD-10-CM

## 2021-11-05 LAB — RESP PANEL BY RT-PCR (FLU A&B, COVID) ARPGX2
Influenza A by PCR: NEGATIVE
Influenza B by PCR: NEGATIVE
SARS Coronavirus 2 by RT PCR: NEGATIVE

## 2021-11-05 LAB — COMPREHENSIVE METABOLIC PANEL
ALT: 13 U/L (ref 0–44)
AST: 15 U/L (ref 15–41)
Albumin: 4.3 g/dL (ref 3.5–5.0)
Alkaline Phosphatase: 76 U/L (ref 38–126)
Anion gap: 8 (ref 5–15)
BUN: 43 mg/dL — ABNORMAL HIGH (ref 8–23)
CO2: 26 mmol/L (ref 22–32)
Calcium: 10 mg/dL (ref 8.9–10.3)
Chloride: 105 mmol/L (ref 98–111)
Creatinine, Ser: 1.08 mg/dL — ABNORMAL HIGH (ref 0.44–1.00)
GFR, Estimated: 50 mL/min — ABNORMAL LOW (ref >60)
Glucose, Bld: 114 mg/dL — ABNORMAL HIGH (ref 70–99)
Potassium: 4.5 mmol/L (ref 3.5–5.1)
Sodium: 139 mmol/L (ref 135–145)
Total Bilirubin: 0.5 mg/dL (ref 0.3–1.2)
Total Protein: 6.9 g/dL (ref 6.5–8.1)

## 2021-11-05 LAB — CBC WITH DIFFERENTIAL/PLATELET
Abs Immature Granulocytes: 0.02 10*3/uL (ref 0.00–0.07)
Basophils Absolute: 0 10*3/uL (ref 0.0–0.1)
Basophils Relative: 0 %
Eosinophils Absolute: 0.1 10*3/uL (ref 0.0–0.5)
Eosinophils Relative: 2 %
HCT: 41.5 % (ref 36.0–46.0)
Hemoglobin: 13.8 g/dL (ref 12.0–15.0)
Immature Granulocytes: 0 %
Lymphocytes Relative: 21 %
Lymphs Abs: 1.3 10*3/uL (ref 0.7–4.0)
MCH: 31.9 pg (ref 26.0–34.0)
MCHC: 33.3 g/dL (ref 30.0–36.0)
MCV: 96.1 fL (ref 80.0–100.0)
Monocytes Absolute: 0.7 10*3/uL (ref 0.1–1.0)
Monocytes Relative: 11 %
Neutro Abs: 4.1 10*3/uL (ref 1.7–7.7)
Neutrophils Relative %: 66 %
Platelets: 156 10*3/uL (ref 150–400)
RBC: 4.32 MIL/uL (ref 3.87–5.11)
RDW: 13.2 % (ref 11.5–15.5)
WBC: 6.3 10*3/uL (ref 4.0–10.5)
nRBC: 0 % (ref 0.0–0.2)

## 2021-11-05 LAB — URINALYSIS, ROUTINE W REFLEX MICROSCOPIC
Bilirubin Urine: NEGATIVE
Glucose, UA: NEGATIVE mg/dL
Hgb urine dipstick: NEGATIVE
Ketones, ur: NEGATIVE mg/dL
Nitrite: NEGATIVE
Protein, ur: 30 mg/dL — AB
Specific Gravity, Urine: 1.024 (ref 1.005–1.030)
pH: 5.5 (ref 5.0–8.0)

## 2021-11-05 LAB — LACTIC ACID, PLASMA: Lactic Acid, Venous: 0.7 mmol/L (ref 0.5–1.9)

## 2021-11-05 MED ORDER — LACTATED RINGERS IV BOLUS
500.0000 mL | Freq: Once | INTRAVENOUS | Status: AC
  Administered 2021-11-05: 500 mL via INTRAVENOUS

## 2021-11-05 MED ORDER — CEPHALEXIN 250 MG PO CAPS
500.0000 mg | ORAL_CAPSULE | Freq: Once | ORAL | Status: AC
  Administered 2021-11-05: 500 mg via ORAL
  Filled 2021-11-05: qty 2

## 2021-11-05 NOTE — ED Triage Notes (Signed)
Started on lisinopril from BP last week. Notice SBP low 90s, fatigue Not feeling well since change, Started with cough  Ems VS: 112/72, 90, 97% RA No ortho changes

## 2021-11-05 NOTE — ED Provider Notes (Signed)
[facility] EMERGENCY DEPT Provider Note   CSN: [id number] Arrival date & time: 11/05/21  2103     History  Chief Complaint  Patient presents with   Hypotension    Janet Wilson is a 87 y.o. female.  HPI      87yo female with history of temporal arteritis, ascending aortic aneurysm, LBBB, atrial tachycardia, moderate aortic regurgitation, nonobstructive CAD, stable 5.6cm fusiform ascending aneurysm followed by Dr. Cyndia Bent CT surgery (last CT 10/04/2021) who presents with concern for fatigue, cough, lightheadedness which began after starting '20mg'$  lisinopril 9/5.  9/5 started lisinopril, has been fatigued, lost 10lb Started the lisinopril because of BP 138 at Cardiology office, had otherwise been ok at home No appetite  Very sleepy Dizzy when going from sitting to standing Hx of ascending and descending aortic aneurysm At times preoccupied with worry while doing things around the home Lifts glasses to focus at times over the last month Denies numbness, weakness, difficulty talking or walking, acute visual changes or facial droop.   No chest pain, shortntess of breaht No n/v/d/blck or bloody stools No appetite but no abdominal pain  Sciatica low back has been ongoing for some time No dysuria  Cough dry, then will take a lozenge, in chorus at [facility] and yesterday was coughing   Has been on coughing since starting lisinopril '20mg'$  No fever   Past Medical History:  Diagnosis Date   Arthritis    OA BOTH KNEES AND HANDS   Atrial tachycardia (HCC)    HX OF   GERD (gastroesophageal reflux disease)    Glaucoma    left eye   Goiter    CAUSING COUGH, HOARSINESS AND DRY THROAT   Headache(784.0)    MIGRAINES    Overactive bladder    Temporal arteritis ([place])     Home Medications Prior to Admission medications   Medication Sig Start Date End Date Taking? Authorizing Provider  BRIMONIDINE TARTRATE EX Apply topically.    [provider]  Calcium Carbonate-Vitamin D (CALCIUM PLUS VITAMIN D PO) Take 1 tablet by mouth daily.    [provider]  cephALEXin (KEFLEX) 500 MG capsule Take 1 capsule (500 mg total) by mouth 3 (three) times daily for 7 days. 11/06/21 11/13/21  Gareth Morgan, MD  Coenzyme Q10 (CO Q 10) 100 MG CAPS Take 1 tablet by mouth at bedtime.     [provider]  ibandronate (BONIVA) 150 MG tablet Take 150 mg by mouth every 30 (thirty) days.  Patient not taking: Reported on 10/17/2021 04/05/19   [provider]  Ibuprofen 200 MG CAPS Take 400 mg by mouth daily as needed (pain.).    [provider]  Magnesium 200 MG TABS Take 1 tablet by mouth daily.    [provider]  methimazole (TAPAZOLE) 5 MG tablet Take 5 mg by mouth every other day. 01/11/21   [provider]  Omega-3 Fatty Acids (FISH OIL) 1200 MG CAPS Take 1 capsule by mouth daily.     [provider]  rivastigmine (EXELON) 1.5 MG capsule TAKE 1 CAPSULE EACH MORNING. 10/10/20   Melvenia Beam, MD  rivastigmine (EXELON) 3 MG capsule Take 1 capsule (3 mg total) by mouth at bedtime. 10/10/20   Melvenia Beam, MD  rosuvastatin (CRESTOR) 10 MG tablet TAKE 1 TABLET BY MOUTH EVERY DAY 09/20/20   Jerline Pain, MD  Rotigotine (NEUPRO) 1 MG/24HR PT24 Place 1 patch (1 mg total) onto the skin at bedtime. Patient not  taking: Reported on 10/04/2021 10/10/20   Melvenia Beam, MD      Allergies    Oxycodone, Mold extract [trichophyton], Molds & smuts, and Codeine    Review of Systems   Review of Systems  Physical Exam Updated Vital Signs BP 134/64   Pulse 77   Temp 98 F (36.7 C) (Oral)   Resp 16   Ht '5\' 3"'$  (1.6 m)   Wt 45.8 kg   LMP  (LMP Unknown)   SpO2 98%   BMI 17.89 kg/m  Physical Exam Vitals and nursing note reviewed.  Constitutional:      General: She is not in acute distress.    Appearance: Normal appearance. She is well-developed. She is not ill-appearing or diaphoretic.  HENT:      Head: Normocephalic and atraumatic.  Eyes:     General: No visual field deficit.    Extraocular Movements: Extraocular movements intact.     Conjunctiva/sclera: Conjunctivae normal.     Pupils: Pupils are equal, round, and reactive to light.  Cardiovascular:     Rate and Rhythm: Normal rate and regular rhythm.     Pulses: Normal pulses.  Pulmonary:     Effort: Pulmonary effort is normal. No respiratory distress.     Breath sounds: Normal breath sounds. No wheezing or rales.  Abdominal:     General: There is no distension.     Palpations: Abdomen is soft.     Tenderness: There is no abdominal tenderness. There is no guarding.  Musculoskeletal:        General: No swelling or tenderness.     Cervical back: Normal range of motion.  Skin:    General: Skin is warm and dry.     Findings: No erythema or rash.  Neurological:     General: No focal deficit present.     Mental Status: She is alert and oriented to person, place, and time.     GCS: GCS eye subscore is 4. GCS verbal subscore is 5. GCS motor subscore is 6.     Cranial Nerves: No cranial nerve deficit, dysarthria or facial asymmetry.     Sensory: No sensory deficit.     Motor: No weakness or tremor.     Coordination: Coordination normal. Finger-Nose-Finger Test normal.     ED Results / Procedures / Treatments   Labs (all labs ordered are listed, but only abnormal results are displayed) Labs Reviewed  COMPREHENSIVE METABOLIC PANEL - Abnormal; Notable for the following components:      Result Value   Glucose, Bld 114 (*)    BUN 43 (*)    Creatinine, Ser 1.08 (*)    GFR, Estimated 50 (*)    All other components within normal limits  URINALYSIS, ROUTINE W REFLEX MICROSCOPIC - Abnormal; Notable for the following components:   Protein, ur 30 (*)    Leukocytes,Ua SMALL (*)    Bacteria, UA RARE (*)    All other components within normal limits  RESP PANEL BY RT-PCR (FLU A&B, COVID) ARPGX2  CBC WITH DIFFERENTIAL/PLATELET   LACTIC ACID, PLASMA    EKG EKG Interpretation  Date/Time:  Sunday November 05 2021 23:10:31 EDT Ventricular Rate:  83 PR Interval:  176 QRS Duration: 148 QT Interval:  405 QTC Calculation: 476 R Axis:   -62 Text Interpretation: Sinus rhythm Probable left atrial enlargement Left bundle branch block No significant change since last tracing Confirmed by Gareth Morgan [phone]) on 11/05/2021 11:29:24 PM  Radiology DG Chest 2 View  Result Date: 11/05/2021 CLINICAL DATA:  Cough EXAM: CHEST - 2 VIEW COMPARISON:  CT angio chest 10/04/2021 FINDINGS: Cardiomegaly. Tortuous, aneurysmal aorta with calcifications as seen on prior chest CT. Lungs clear. No effusions or acute bony abnormality. IMPRESSION: Aneurysmal dilatation of the thoracic aorta as seen on prior CT. No acute cardiopulmonary disease. Electronically Signed   By: Rolm Baptise M.D.   On: 11/05/2021 22:41    Procedures Procedures    Medications Ordered in ED Medications  lactated ringers bolus 500 mL (0 mLs Intravenous Stopped 11/06/21 0024)  cephALEXin (KEFLEX) capsule 500 mg (500 mg Oral Given 11/05/21 2336)    ED Course/ Medical Decision Making/ A&P                           Medical Decision Making Amount and/or Complexity of Data Reviewed Labs: ordered. Radiology: ordered.  Risk Prescription drug management.    87yo female with history of temporal arteritis, ascending aortic aneurysm, LBBB, atrial tachycardia, moderate aortic regurgitation, nonobstructive CAD, stable 5.6cm fusiform ascending aneurysm followed by Dr. Cyndia Bent CT surgery (last CT 10/04/2021) who presents with concern for fatigue, cough, lightheadedness which began after starting '20mg'$  lisinopril 9/5.  Concern for low blood pressures at [facility] over the last few weeks. On arrival blood pressure normal, did have one BP 98/69.   DDx for hypotension includes medication affect, cardiac etiology, PE, dissection, sepsis, anemia/bleed.  Labs completed and  personally evaluated interpreted by me show no leukocytosis, no anemia, mild AKI with a creatinine of 1.08 from previous of 0.85, negative COVID and flu testing, lactic acid within normal limits.  EKG completed and personally evaluated interpreted by me shows no significant change from prior.  Chest x-ray completed personally about interpreted by me shows aneurysmal dilation of the thoracic aorta as seen on prior CT.  Discussed that in absence of other symptoms, including no chest pain, back pain, and with recent CT August 23 showing stability of her aneurysm as well as likely etiology of her hypotension being initiation of the lisinopril before the symptoms began I have low suspicion for other rupture or complication of her aortic aneurysm.  Also have low suspicion for pulmonary embolus in the absence of shortness of breath, tachycardia, or chest pain.  She reports she had normal blood pressures prior to being initiated on the 20 mg of lisinopril daily and had mild hypertension in the 130s on the day this medication was started. She does need close BP control in setting of aneurysm, however it appears her BP are running lower secondary to being on this medication and suspect this is also contributing to her mild AKI. Given IV fluids.  Also suspect cough secondary to lisinopril given no evidence of pneumonia and this starting after beginning the medication. UA with some WBC, will cover for UTI but do not feel presentation is consistent with sepsis in absence of leukocytosis, fever or lactic acidosis.  Recommend discontinuing lisinopril, following BP closely at [facility] and close PCP follow for recheck of Cr. Discussed strict return precautions. Patient discharged in stable condition with understanding of reasons to return. Keflex rx sent to pharmacy.           Final Clinical Impression(s) / ED Diagnoses Final diagnoses:  Hypotension, unspecified hypotension type  Urinary tract infection  without hematuria, site unspecified  Medication side effect    Rx / DC Orders ED Discharge Orders     None  Gareth Morgan, MD 11/06/21 1257

## 2021-11-06 ENCOUNTER — Telehealth (HOSPITAL_BASED_OUTPATIENT_CLINIC_OR_DEPARTMENT_OTHER): Payer: Self-pay | Admitting: Emergency Medicine

## 2021-11-06 MED ORDER — CEPHALEXIN 500 MG PO CAPS: 500.0000 mg | ORAL_CAPSULE | Freq: Three times a day (TID) | ORAL | 0 refills | Status: AC

## 2021-11-06 NOTE — Discharge Instructions (Addendum)
It is likely your symptoms are due to your lisinopril.  Given your blood pressures today, recommend stopping this and monitoring your blood pressures. Recommend recheck of your kidney numbers. If you develop chest pain, dyspnea other concerns return to the ED.

## 2021-11-07 DIAGNOSIS — N390 Urinary tract infection, site not specified: Secondary | ICD-10-CM | POA: Diagnosis not present

## 2021-11-07 DIAGNOSIS — E7800 Pure hypercholesterolemia, unspecified: Secondary | ICD-10-CM | POA: Diagnosis not present

## 2021-11-07 DIAGNOSIS — K219 Gastro-esophageal reflux disease without esophagitis: Secondary | ICD-10-CM | POA: Diagnosis not present

## 2021-11-07 DIAGNOSIS — R000 Tachycardia, unspecified: Secondary | ICD-10-CM | POA: Diagnosis not present

## 2021-11-07 DIAGNOSIS — I10 Essential (primary) hypertension: Secondary | ICD-10-CM | POA: Diagnosis not present

## 2021-11-07 DIAGNOSIS — G43009 Migraine without aura, not intractable, without status migrainosus: Secondary | ICD-10-CM | POA: Diagnosis not present

## 2021-11-07 DIAGNOSIS — I959 Hypotension, unspecified: Secondary | ICD-10-CM | POA: Diagnosis not present

## 2021-11-07 DIAGNOSIS — M810 Age-related osteoporosis without current pathological fracture: Secondary | ICD-10-CM | POA: Diagnosis not present

## 2021-11-07 NOTE — Progress Notes (Signed)
Cardiology Office Note:    Date:  11/10/2021   ID:  Janet Wilson, DOB [birth year], MRN [id number]  PCP:  Janet Huddle, MD   [facility] Providers Cardiologist:  Janet Furbish, MD     Referring MD: Janet Huddle, MD   Chief Complaint: hospital follow-up hypotension  History of Present Illness:    Janet Wilson is a very pleasant 87 y.o. female with a hx of ascending aortic aneurysm, LBBB, atrial tachycardia, moderate aortic regurgitation, nonobstructive CAD, GERD, and family history of early CAD (mother died age 34 from [place] - was smoker).   She established cardiology care in 2013 with Janet Wilson for tachycardia. A CT revealed coronary artery calcification/aortic atherosclerosis and she was started on statin therapy . Previously wore event monitor when seen by Janet Wilson cardiology in 2017 that showed runs of SVT and no evidence of AF. Found to have new LBBB.  She denied chest pain or shortness of breath with tachycardia.  Echocardiogram demonstrated markedly dilated ascending aorta at 55 mm on 01/14/2020 with CTA for measurement and referral to CT surgery. CTA 01/14/20 confirmed size at 5.5 cm. Additionally, echo revealed moderate aortic valve sclerosis with mild aortic regurgitation and EF 55 to 60% nuclear stress test showed no areas of ischemia or previous infarct  Followed by Janet Wilson, CT surgery and last seen on 03/01/2021 for 5.5 cm fusiform ascending aortic aneurysm that has been followed closely due to her advanced age and mild neurocognitive dysfunction. The aneurysm extends up into the aortic arch and would require circulatory arrest for partial arch replacement. Head CT 02/23/2021 following a fall revealed mild cerebral atrophy and some chronic small vessel ischemic changes with a question of chronic lacunar infarcts bilaterally but no acute abnormality. She additionally underwent evaluation at [facility] for second opinion and family decided against surgical  repair.  Last cardiology clinic ov was 10/17/21 with Janet Pancoast, NP. BP was mildly elevated and she was advised to start losartan. ED visit 11/05/21 revealed hypotension, fatigue, agent was discontinued.  Today, she is here accompanied by her daughter for hospital follow-up.  She had an episode of significant hypotension on 11/05/2021 and was advised to go to the ED by the nurse at [facility]. Daughter reports SBP was 80 and patient was very fatigued. BP improved with IV fluids and she was discharged home. Was advised to follow-up with cardiology and questions why a high dose of lisinopril was started for mild BP elevation. Today, she reports that she is feeling better but continues to cough. Is concerned about thoracic aneurysm with coughing. She is returning to normal activities with only mild fatigue. Admits she does not drink enough water. She denies chest pain, shortness of breath, lower extremity edema, fatigue, palpitations, melena, hematuria, hemoptysis, diaphoresis, weakness, presyncope, syncope, orthopnea, and PND.   Past Medical History:  Diagnosis Date   Arthritis    OA BOTH KNEES AND HANDS   Atrial tachycardia (HCC)    HX OF   GERD (gastroesophageal reflux disease)    Glaucoma    left eye   Goiter    CAUSING COUGH, HOARSINESS AND DRY THROAT   Headache(784.0)    MIGRAINES    Overactive bladder    Temporal arteritis (HCC)     Past Surgical History:  Procedure Laterality Date   ARTERY BIOPSY Right 07/21/2012   Procedure: BIOPSY TEMPORAL ARTERY;  Surgeon: Haywood Lasso, MD;  Location: [facility];  Service: General;  Laterality: Right;   CERVICAL FUSION  2012   Dr. Vertell Limber   EYE SURGERY     CATARACT EXTRACTION- BILATERAL   INCONTINENCE SURGERY     JOINT REPLACEMENT     right knee   LEFT SHOULDER SURGERY     RIGHT KNEE ARTHROSCOPY  11/2009     ROBOTIC ASSISTED BILATERAL SALPINGO OOPHERECTOMY Bilateral 12/08/2013   Procedure: ROBOTIC ASSISTED LAPAROSCOPIC BILATERAL SALPINGO  OOPHORECTOMY WITH STAGING;  Surgeon: Everitt Amber, MD;  Location: [facility];  Service: Gynecology;  Laterality: Bilateral;   TOTAL KNEE ARTHROPLASTY  07/04/2011   Procedure: TOTAL KNEE ARTHROPLASTY;  Surgeon: Gearlean Alf, MD;  Location: [facility];  Service: Orthopedics;  Laterality: Right;    Current Medications: Current Meds  Medication Sig   BRIMONIDINE TARTRATE EX Apply topically.   Calcium Carbonate-Vitamin D (CALCIUM PLUS VITAMIN D PO) Take 1 tablet by mouth daily.   cephALEXin (KEFLEX) 500 MG capsule Take 1 capsule (500 mg total) by mouth 3 (three) times daily for 7 days.   Coenzyme Q10 (CO Q 10) 100 MG CAPS Take 1 tablet by mouth at bedtime.    Ibuprofen 200 MG CAPS Take 400 mg by mouth daily as needed (pain.).   Magnesium 200 MG TABS Take 1 tablet by mouth daily.   methimazole (TAPAZOLE) 5 MG tablet Take 5 mg by mouth every other day.   Omega-3 Fatty Acids (FISH OIL) 1200 MG CAPS Take 1 capsule by mouth daily.    rivastigmine (EXELON) 1.5 MG capsule TAKE 1 CAPSULE EACH MORNING.   rivastigmine (EXELON) 3 MG capsule TAKE 1 CAPSULE (3 MG TOTAL) BY MOUTH AT BEDTIME.   rosuvastatin (CRESTOR) 10 MG tablet TAKE 1 TABLET BY MOUTH EVERY DAY     Allergies:   Lisinopril, Oxycodone, Mold extract [trichophyton], Molds & smuts, and Codeine   Social History   Socioeconomic History   Marital status: Married    Spouse name: Janet Wilson   Number of children: 2   Years of education: college   Highest education level: Not on file  Occupational History   Occupation: retired    Fish farm manager: RETIRED  Tobacco Use   Smoking status: Never   Smokeless tobacco: Never  Vaping Use   Vaping Use: Never used  Substance and Sexual Activity   Alcohol use: Yes    Alcohol/week: 1.0 standard drink of alcohol    Types: 1 Glasses of wine per week    Comment: SELDOM, very little   Drug use: No   Sexual activity: Yes  Other Topics Concern   Not on file  Social History Narrative   Pt lives at home with family.    Caffeine Use: Very little.    Patient is right handed       Social History      Diet? No       Do you drink/eat things with caffeine? Some coffee      Marital status?    Married                                What year were you married? 1961      Do you live in a house, apartment, assisted living, condo, trailer, etc.?  [place]       Is it one or more stories? one      How many persons live in your home? two      Do you have any pets in your home? No  Highest level of education completed? 14 years       Current or past profession:  Chemical engineer       Do you exercise?              Yes                        Type & how often? Water aerobic and balance class      Advanced Directives      Do you have a living will?  yes      Do you have a DNR form?        yes                          If not, do you want to discuss one?      Do you have signed POA/HPOA for forms? yes      Functional Status      Do you have difficulty bathing or dressing yourself?      Do you have difficulty preparing food or eating?       Do you have difficulty managing your medications?      Do you have difficulty managing your finances?      Do you have difficulty affording your medications?   Social Determinants of Health   Financial Resource Strain: Not on file  Food Insecurity: Not on file  Transportation Needs: Not on file  Physical Activity: Not on file  Stress: Not on file  Social Connections: Not on file     Family History: The patient's family history includes Alzheimer's disease in her mother; Heart attack in her father.  ROS:   Please see the history of present illness.   + fatigue  All other systems reviewed and are negative.  Labs/Other Studies Reviewed:    The following studies were reviewed today:  Echo 01/12/20  1. Aortic valve is severely calcified with mild stenosis and moderate  insufficiency. There is severe dilated ascending aortic aneurysm with  maximum  diameter 55 mm. A dedicated chest CTA or MRA and referral to CT  surgery is recommended.   2. Left ventricular ejection fraction, by estimation, is 55 to 60%. The  left ventricle has normal function. The left ventricle has no regional  wall motion abnormalities. Left ventricular diastolic parameters are  consistent with Grade I diastolic  dysfunction (impaired relaxation). Elevated left ventricular end-diastolic  pressure.   3. Right ventricular systolic function is normal. The right ventricular  size is normal.   4. The mitral valve is normal in structure. No evidence of mitral valve  regurgitation. No evidence of mitral stenosis. Severe mitral annular  calcification.   5. The aortic valve is calcified. There is severe calcifcation of the  aortic valve. There is severe thickening of the aortic valve. Aortic valve  regurgitation is moderate. Mild aortic valve stenosis.   6. Aortic dilatation noted. Aneurysm of the ascending aorta, measuring 55  mm. There is severe dilatation of the ascending aorta, measuring 55 mm.   7. The inferior vena cava is dilated in size with <50% respiratory  variability, suggesting right atrial pressure of 15 mmHg.   Recent Labs: 11/05/2021: ALT 13; Hemoglobin 13.8; Platelets 156 11/09/2021: BUN 23; Creatinine, Ser 0.78; Potassium 4.1; Sodium 139  Recent Lipid Panel    Component Value Date/Time   CHOL 134 03/18/2020 0745   TRIG 39 03/18/2020 0745   HDL 67  03/18/2020 0745   CHOLHDL 2.0 03/18/2020 0745   LDLCALC 57 03/18/2020 0745     Risk Assessment/Calculations:           Physical Exam:    VS:  BP 108/60   Pulse 87   Ht 5' 3.5" (1.613 m)   Wt 109 lb 6.4 oz (49.6 kg)   LMP  (LMP Unknown)   SpO2 98%   BMI 19.08 kg/m     Wt Readings from Last 3 Encounters:  11/09/21 109 lb 6.4 oz (49.6 kg)  11/05/21 101 lb (45.8 kg)  10/17/21 110 lb 12.8 oz (50.3 kg)     GEN:  Petite in stature, well developed in no acute distress HEENT: Normal NECK: No  JVD; No carotid bruits CARDIAC: RRR, no murmurs, rubs, gallops RESPIRATORY:  Clear to auscultation without rales, wheezing or rhonchi  ABDOMEN: Soft, non-tender, non-distended MUSCULOSKELETAL:  No edema; No deformity. 2+ pedal pulses, equal bilaterally SKIN: Warm and dry NEUROLOGIC:  Alert and oriented x 3 PSYCHIATRIC:  Normal affect   EKG:  EKG is not ordered today.    Diagnoses:    1. Hypertension, unspecified type   2. Aortic atherosclerosis ([place])   3. Coronary artery calcification   4. Aortic aneurysm without rupture, unspecified portion of aorta (HCC)    Assessment and Plan:     History of hypertension with recent episode of hypotension: Advised to start lisinopril 20 mg 10/17/2021 for mildly elevated BP.  Had several days of fatigue and some weight loss and had significant hypotension on 11/05/21 for which she was treated in ED. Lisinopril discontinued. BP has stabilized but remains somewhat soft today. No presyncope, syncope. Continue to monitor home BP and report concerning readings or symptoms prior to next office visit. Will update BMET today as she had mild kidney injury in ED. Good hydration with water encouraged.   Valve disease: Mild mitral valve regurgitation, mild aortic valve stenosis on echo 12/2019. She is asymptomatic. No significant murmur on exam. Will continue to monitor clinically at this time.   Ascending aortic aneurysm: CTA 10/04/21 with aneurysm measuring 5.7 x 5.6 cm, stable in comparison to previous imaging. Followed by CT surgery with decision to manage conservatively. Concerned about cough thought to be 2/2 lisinopril. She d/ced lisinopril on 9/25, symptoms likely to continue to improve.  Encouraged her to use a soft pillow against chest for stability with coughing. Continue to observe weight lifting precaution. Continue routine follow-up with Janet Wilson.     Disposition: 6 months with Janet Wilson  Medication Adjustments/Labs and Tests Ordered: Current  medicines are reviewed at length with the patient today.  Concerns regarding medicines are outlined above.  Orders Placed This Encounter  Procedures   Basic Metabolic Panel (BMET)   No orders of the defined types were placed in this encounter.   Patient Instructions  Medication Instructions:   Your physician recommends that you continue on your current medications as directed. Please refer to the Current Medication list given to you today.   *If you need a refill on your cardiac medications before your next appointment, please call your pharmacy*   Lab Work:  TODAY!!!! BMET  If you have labs (blood work) drawn today and your tests are completely normal, you will receive your results only by: [place] (if you have MyChart) OR A paper copy in the mail If you have any lab test that is abnormal or we need to change your treatment, we will call you to review the  results.   Testing/Procedures:  None ordered.   Follow-Up: At [facility], you and your health needs are our priority.  As part of our continuing mission to provide you with exceptional heart care, we have created designated Provider Care Teams.  These Care Teams include your primary Cardiologist (physician) and Advanced Practice Providers (APPs -  Physician Assistants and Nurse Practitioners) who all work together to provide you with the care you need, when you need it.  We recommend signing up for the patient portal called "MyChart".  Sign up information is provided on this After Visit Summary.  MyChart is used to connect with patients for Virtual Visits (Telemedicine).  Patients are able to view lab/test results, encounter notes, upcoming appointments, etc.  Non-urgent messages can be sent to your provider as well.   To learn more about what you can do with MyChart, go to [email or web address].    Your next appointment:   6 month(s)  The format for your next appointment:   In Person  Provider:    Candee Furbish, MD     Important Information About Sugar         Signed, Emmaline Life, NP  11/10/2021 7:40 AM    [place]

## 2021-11-09 ENCOUNTER — Ambulatory Visit: Payer: Medicare Other | Attending: Nurse Practitioner | Admitting: Nurse Practitioner

## 2021-11-09 ENCOUNTER — Encounter: Payer: Self-pay | Admitting: Nurse Practitioner

## 2021-11-09 VITALS — BP 108/60 | HR 87 | Ht 63.5 in | Wt 109.4 lb

## 2021-11-09 DIAGNOSIS — I700 Atherosclerosis of aorta: Secondary | ICD-10-CM

## 2021-11-09 DIAGNOSIS — I719 Aortic aneurysm of unspecified site, without rupture: Secondary | ICD-10-CM

## 2021-11-09 DIAGNOSIS — I2510 Atherosclerotic heart disease of native coronary artery without angina pectoris: Secondary | ICD-10-CM | POA: Diagnosis not present

## 2021-11-09 DIAGNOSIS — I2584 Coronary atherosclerosis due to calcified coronary lesion: Secondary | ICD-10-CM

## 2021-11-09 DIAGNOSIS — I10 Essential (primary) hypertension: Secondary | ICD-10-CM

## 2021-11-09 NOTE — Patient Instructions (Signed)
Medication Instructions:   Your physician recommends that you continue on your current medications as directed. Please refer to the Current Medication list given to you today.   *If you need a refill on your cardiac medications before your next appointment, please call your pharmacy*   Lab Work:  TODAY!!!! BMET  If you have labs (blood work) drawn today and your tests are completely normal, you will receive your results only by: [place] (if you have MyChart) OR A paper copy in the mail If you have any lab test that is abnormal or we need to change your treatment, we will call you to review the results.   Testing/Procedures:  None ordered.   Follow-Up: At [facility], you and your health needs are our priority.  As part of our continuing mission to provide you with exceptional heart care, we have created designated Provider Care Teams.  These Care Teams include your primary Cardiologist (physician) and Advanced Practice Providers (APPs -  Physician Assistants and Nurse Practitioners) who all work together to provide you with the care you need, when you need it.  We recommend signing up for the patient portal called "MyChart".  Sign up information is provided on this After Visit Summary.  MyChart is used to connect with patients for Virtual Visits (Telemedicine).  Patients are able to view lab/test results, encounter notes, upcoming appointments, etc.  Non-urgent messages can be sent to your provider as well.   To learn more about what you can do with MyChart, go to [email or web address].    Your next appointment:   6 month(s)  The format for your next appointment:   In Person  Provider:   Candee Furbish, MD     Important Information About Sugar

## 2021-11-10 ENCOUNTER — Encounter: Payer: Self-pay | Admitting: Nurse Practitioner

## 2021-11-10 LAB — BASIC METABOLIC PANEL
BUN/Creatinine Ratio: 29 — ABNORMAL HIGH (ref 12–28)
BUN: 23 mg/dL (ref 8–27)
CO2: 25 mmol/L (ref 20–29)
Calcium: 9.6 mg/dL (ref 8.7–10.3)
Chloride: 101 mmol/L (ref 96–106)
Creatinine, Ser: 0.78 mg/dL (ref 0.57–1.00)
Glucose: 91 mg/dL (ref 70–99)
Potassium: 4.1 mmol/L (ref 3.5–5.2)
Sodium: 139 mmol/L (ref 134–144)
eGFR: 74 mL/min/{1.73_m2} (ref >59)

## 2021-11-14 ENCOUNTER — Ambulatory Visit: Payer: Medicare Other | Admitting: Nurse Practitioner

## 2021-12-06 ENCOUNTER — Other Ambulatory Visit: Payer: Self-pay | Admitting: Cardiology

## 2021-12-13 ENCOUNTER — Encounter: Payer: Medicare Other | Admitting: Internal Medicine

## 2021-12-19 ENCOUNTER — Non-Acute Institutional Stay: Payer: Medicare Other | Admitting: Internal Medicine

## 2021-12-19 ENCOUNTER — Encounter: Payer: Self-pay | Admitting: Internal Medicine

## 2021-12-19 VITALS — BP 130/72 | HR 73 | Temp 98.4°F | Resp 18 | Ht 63.5 in | Wt 107.6 lb

## 2021-12-19 DIAGNOSIS — E042 Nontoxic multinodular goiter: Secondary | ICD-10-CM | POA: Diagnosis not present

## 2021-12-19 DIAGNOSIS — F418 Other specified anxiety disorders: Secondary | ICD-10-CM

## 2021-12-19 DIAGNOSIS — R0982 Postnasal drip: Secondary | ICD-10-CM | POA: Diagnosis not present

## 2021-12-19 DIAGNOSIS — K219 Gastro-esophageal reflux disease without esophagitis: Secondary | ICD-10-CM | POA: Diagnosis not present

## 2021-12-19 DIAGNOSIS — I4719 Other supraventricular tachycardia: Secondary | ICD-10-CM

## 2021-12-19 DIAGNOSIS — F5101 Primary insomnia: Secondary | ICD-10-CM

## 2021-12-19 DIAGNOSIS — R634 Abnormal weight loss: Secondary | ICD-10-CM | POA: Diagnosis not present

## 2021-12-19 DIAGNOSIS — J310 Chronic rhinitis: Secondary | ICD-10-CM

## 2021-12-19 DIAGNOSIS — M1712 Unilateral primary osteoarthritis, left knee: Secondary | ICD-10-CM

## 2021-12-19 DIAGNOSIS — L03115 Cellulitis of right lower limb: Secondary | ICD-10-CM | POA: Diagnosis not present

## 2021-12-19 DIAGNOSIS — E785 Hyperlipidemia, unspecified: Secondary | ICD-10-CM | POA: Diagnosis not present

## 2021-12-19 DIAGNOSIS — G3184 Mild cognitive impairment, so stated: Secondary | ICD-10-CM

## 2021-12-19 MED ORDER — MIRTAZAPINE 7.5 MG PO TABS: 7.5000 mg | ORAL_TABLET | Freq: Every day | ORAL | 1 refills | Status: DC

## 2021-12-19 MED ORDER — OMEPRAZOLE 40 MG PO CPDR: 40.0000 mg | DELAYED_RELEASE_CAPSULE | Freq: Every day | ORAL | 3 refills | Status: DC

## 2021-12-20 DIAGNOSIS — K219 Gastro-esophageal reflux disease without esophagitis: Secondary | ICD-10-CM | POA: Insufficient documentation

## 2021-12-20 DIAGNOSIS — L03115 Cellulitis of right lower limb: Secondary | ICD-10-CM | POA: Insufficient documentation

## 2021-12-20 DIAGNOSIS — F418 Other specified anxiety disorders: Secondary | ICD-10-CM | POA: Insufficient documentation

## 2021-12-20 DIAGNOSIS — E785 Hyperlipidemia, unspecified: Secondary | ICD-10-CM | POA: Insufficient documentation

## 2021-12-20 DIAGNOSIS — F5101 Primary insomnia: Secondary | ICD-10-CM | POA: Insufficient documentation

## 2021-12-20 DIAGNOSIS — R0982 Postnasal drip: Secondary | ICD-10-CM | POA: Insufficient documentation

## 2021-12-20 MED ORDER — DOXYCYCLINE HYCLATE 100 MG PO TABS: 100.0000 mg | ORAL_TABLET | Freq: Two times a day (BID) | ORAL | 0 refills | Status: DC

## 2021-12-20 NOTE — Progress Notes (Signed)
Location:  [place] of Service:  Clinic (12)  Provider:   Code Status:  Goals of Care:     12/19/2021    4:47 PM  Advanced Directives  Does Patient Have a Medical Advance Directive? Yes  Type of Paramedic of [place];Living will;Out of facility DNR (pink MOST or yellow form)  Does patient want to make changes to medical advance directive? No - Patient declined  Copy of [place] in Chart? No - copy requested     Chief Complaint  Patient presents with   New Patient (Initial Visit)    Patient is Establishing care    Quality Metric Gaps    Discussed the need for AWV, Covid Vaccine and Flu    HPI: Patient is a 87 y.o. female seen today for medical management of chronic diseases.    Patient has a history of Ascending aortic aneurysm 5.7 cm Follows with Dr. Caffie Pinto.  It has been stable.  At this time family has decided for conservative management Also has a history of nonobstructive CAD, moderate AR, LBBB  Cough She complains of reflux-like symptoms.  Also some postnasal drip.  She states sometimes she coughs for a long time specially when she gets up in the morning .  Initially it was thought that it was precipitated by lisinopril which she is not on anymore Weight loss Per patient and daughter patient has good appetite but has had unintentional weight loss in past 1 year.  Documented 10 pounds.  She has seen Dr. Denton Lank endocrinologist for her thyroid.  Her TSH is mildly low.  She is on methimazole every other day.  Dr. Denton Lank does not think it is related to her thyroid Multinodular goiter Biopsies have been negative patient follows with endocrinology Dr Denton Lank Depression and anxiety with insomnia Due to stress related to her husband who is now having cognitive impairment [facility]CI Is on Exelon patch for last 8 years per Neurology.  Still drives independent in her ADLs takes her own medications walk with no  assist A skin tear in her right leg Is getting dressing from the facility nurse Daughter with her today   Past Medical History:  Diagnosis Date   Aortic aneurysm ([place])    Arthritis    OA BOTH KNEES AND HANDS   Atrial tachycardia    HX OF   GERD (gastroesophageal reflux disease)    Glaucoma    left eye   Goiter    CAUSING COUGH, HOARSINESS AND DRY THROAT   Headache(784.0)    MIGRAINES    Hyperlipidemia    Neuralgia    Overactive bladder    RLS (restless legs syndrome)    Temporal arteritis ([facility])     Past Surgical History:  Procedure Laterality Date   ARTERY BIOPSY Right 07/21/2012   Procedure: BIOPSY TEMPORAL ARTERY;  Surgeon: Haywood Lasso, MD;  Location: [facility];  Service: General;  Laterality: Right;   CERVICAL FUSION  2012   Dr. Vertell Limber   EYE SURGERY     CATARACT EXTRACTION- BILATERAL   INCONTINENCE SURGERY     JOINT REPLACEMENT     right knee   LEFT SHOULDER SURGERY     RIGHT KNEE ARTHROSCOPY  11/2009     ROBOTIC ASSISTED BILATERAL SALPINGO OOPHERECTOMY Bilateral 12/08/2013   Procedure: ROBOTIC ASSISTED LAPAROSCOPIC BILATERAL SALPINGO OOPHORECTOMY WITH STAGING;  Surgeon: Everitt Amber, MD;  Location: [facility];  Service: Gynecology;  Laterality: Bilateral;   TOTAL  KNEE ARTHROPLASTY  07/04/2011   Procedure: TOTAL KNEE ARTHROPLASTY;  Surgeon: Gearlean Alf, MD;  Location: [facility];  Service: Orthopedics;  Laterality: Right;    Allergies  Allergen Reactions   Lisinopril Cough    Cough and very low BP , very tired   Oxycodone Anaphylaxis   Mold Extract [Trichophyton]     headache migraine   Molds & Smuts     Other reaction(s): Other (See Comments)   Codeine Nausea Only    Outpatient Encounter Medications as of 12/19/2021  Medication Sig   BRIMONIDINE TARTRATE EX Apply topically.   Calcium Carbonate-Vitamin D (CALCIUM PLUS VITAMIN D PO) Take 1 tablet by mouth daily.   Coenzyme Q10 (CO Q 10) 100 MG CAPS Take 1 tablet by mouth at bedtime.    doxycycline  (VIBRA-TABS) 100 MG tablet Take 1 tablet (100 mg total) by mouth 2 (two) times daily for 7 days.   methimazole (TAPAZOLE) 5 MG tablet Take 5 mg by mouth every other day.   mirtazapine (REMERON) 7.5 MG tablet Take 1 tablet (7.5 mg total) by mouth at bedtime.   Omega-3 Fatty Acids (FISH OIL) 1200 MG CAPS Take 1 capsule by mouth daily.    omeprazole (PRILOSEC) 40 MG capsule Take 1 capsule (40 mg total) by mouth daily.   rivastigmine (EXELON) 1.5 MG capsule TAKE 1 CAPSULE EACH MORNING.   rivastigmine (EXELON) 3 MG capsule TAKE 1 CAPSULE (3 MG TOTAL) BY MOUTH AT BEDTIME.   rosuvastatin (CRESTOR) 10 MG tablet TAKE 1 TABLET BY MOUTH EVERY DAY   [DISCONTINUED] Ibuprofen 200 MG CAPS Take 400 mg by mouth daily as needed (pain.).   [DISCONTINUED] Magnesium 200 MG TABS Take 1 tablet by mouth daily.   No facility-administered encounter medications on file as of 12/19/2021.    Review of Systems:  Review of Systems  Constitutional:  Positive for unexpected weight change. Negative for activity change and appetite change.  HENT: Negative.    Respiratory:  Negative for cough and shortness of breath.   Cardiovascular:  Positive for leg swelling.  Gastrointestinal:  Negative for constipation.  Genitourinary: Negative.   Musculoskeletal:  Negative for arthralgias, gait problem and myalgias.  Skin: Negative.   Neurological:  Negative for dizziness and weakness.  Psychiatric/Behavioral:  Positive for dysphoric mood and sleep disturbance. Negative for confusion. The patient is nervous/anxious.     Health Maintenance  Topic Date Due   COVID-19 Vaccine (1) Never done   Medicare Annual Wellness (AWV)  04/18/2021   INFLUENZA VACCINE  09/12/2021   TETANUS/TDAP  12/12/2027   Pneumonia Vaccine 78+ Years old  Completed   DEXA SCAN  Completed   Zoster Vaccines- Shingrix  Completed   HPV VACCINES  Aged Out    Physical Exam: Vitals:   12/19/21 1640  BP: 130/72  Pulse: 73  Resp: 18  Temp: 98.4 F (36.9 C)   TempSrc: Temporal  SpO2: 100%  Weight: 107 lb 9.6 oz (48.8 kg)  Height: 5' 3.5" (1.613 m)   Body mass index is 18.76 kg/m. Physical Exam Vitals reviewed.  Constitutional:      Appearance: Normal appearance.  HENT:     Head: Normocephalic.     Nose: Nose normal.     Mouth/Throat:     Mouth: Mucous membranes are moist.     Pharynx: Oropharynx is clear.  Eyes:     Pupils: Pupils are equal, round, and reactive to light.  Cardiovascular:     Rate and Rhythm: Normal rate and  regular rhythm.     Pulses: Normal pulses.     Heart sounds: Murmur heard.  Pulmonary:     Effort: Pulmonary effort is normal.     Breath sounds: Normal breath sounds.  Abdominal:     General: Abdomen is flat. Bowel sounds are normal.     Palpations: Abdomen is soft.  Musculoskeletal:        General: No swelling.     Cervical back: Neck supple.     Comments: Right Leg had Skin tear with redness around  No discharge Slightly tender  Skin:    General: Skin is warm.  Neurological:     General: No focal deficit present.     Mental Status: She is alert and oriented to person, place, and time.     Comments: Gait stable  Psychiatric:        Mood and Affect: Mood normal.        Thought Content: Thought content normal.     Labs reviewed: Basic Metabolic Panel: Recent Labs    10/31/21 1329 11/05/21 2127 11/09/21 1452  NA 140 139 139  K 4.5 4.5 4.1  CL 103 105 101  CO2 '23 26 25  '$ GLUCOSE 115* 114* 91  BUN 25 43* 23  CREATININE 0.85 1.08* 0.78  CALCIUM 9.2 10.0 9.6   Liver Function Tests: Recent Labs    11/05/21 2127  AST 15  ALT 13  ALKPHOS 76  BILITOT 0.5  PROT 6.9  ALBUMIN 4.3   No results for input(s): "LIPASE", "AMYLASE" in the last 8760 hours. No results for input(s): "AMMONIA" in the last 8760 hours. CBC: Recent Labs    11/05/21 2127  WBC 6.3  NEUTROABS 4.1  HGB 13.8  HCT 41.5  MCV 96.1  PLT 156   Lipid Panel: No results for input(s): "CHOL", "HDL", "LDLCALC", "TRIG",  "CHOLHDL", "LDLDIRECT" in the last 8760 hours. Lab Results  Component Value Date   HGBA1C 5.4 05/21/2014    Procedures since last visit: No results found.  Assessment/Plan 1. Weight loss Also is seeing Dr Collene Mares GI  - CT Abdomen Pelvis W Contrast; Future  2. Post-nasal drip Claritin Prn for now   3. Gastroesophageal reflux disease without esophagitis Prilosec Follow with Dr Collene Mares  4. Depression with anxiety Try Remeron 7.5 mg QHS Situational stresses with Husband  5. Multinodular goiter (nontoxic) On Methimazole Follows with Endocrinology  6. Primary osteoarthritis of left knee Tylenol Sees Dr Maureen Ralphs  7. Cellulitis of right leg Doxycyline 7 days Follow with Facility Nurse  8. Primary insomnia Remeron will help  9. Hyperlipidemia, unspecified hyperlipidemia type On statin 10 [facility] Exelon Patch Have been on it for 8 years Follows with Dr Rexene Alberts  11 Glaucoma    Labs/tests ordered:  * No order type specified * Next appt:  01/16/2022

## 2021-12-26 ENCOUNTER — Other Ambulatory Visit: Payer: Self-pay | Admitting: Internal Medicine

## 2021-12-26 MED ORDER — DOXYCYCLINE HYCLATE 100 MG PO TABS: 100.0000 mg | ORAL_TABLET | Freq: Two times a day (BID) | ORAL | 0 refills | Status: AC

## 2021-12-26 NOTE — Progress Notes (Signed)
Continues to have some redness around her Lower Leg Skin tear. It has reduced in Size but still there Facility nurse is also doing Silver alginate dressing with Foam The wound itself is reduced in size and smaller now They will let me know if it is not better in 1 more week

## 2021-12-28 ENCOUNTER — Other Ambulatory Visit (HOSPITAL_COMMUNITY): Payer: Self-pay | Admitting: Gastroenterology

## 2021-12-28 ENCOUNTER — Encounter: Payer: Self-pay | Admitting: Internal Medicine

## 2021-12-28 DIAGNOSIS — R1032 Left lower quadrant pain: Secondary | ICD-10-CM | POA: Diagnosis not present

## 2021-12-28 DIAGNOSIS — R634 Abnormal weight loss: Secondary | ICD-10-CM | POA: Diagnosis not present

## 2021-12-28 DIAGNOSIS — K5900 Constipation, unspecified: Secondary | ICD-10-CM | POA: Diagnosis not present

## 2021-12-28 DIAGNOSIS — R110 Nausea: Secondary | ICD-10-CM | POA: Diagnosis not present

## 2021-12-28 NOTE — Telephone Encounter (Signed)
Pended Rx and sent to Dr. Lyndel Safe for approval.

## 2021-12-29 ENCOUNTER — Ambulatory Visit (HOSPITAL_BASED_OUTPATIENT_CLINIC_OR_DEPARTMENT_OTHER)
Admission: RE | Admit: 2021-12-29 | Discharge: 2021-12-29 | Disposition: A | Discharge status: Home / Self Care | Payer: Medicare Other | Source: Ambulatory Visit | Attending: Gastroenterology | Admitting: Gastroenterology

## 2021-12-29 DIAGNOSIS — R634 Abnormal weight loss: Secondary | ICD-10-CM | POA: Insufficient documentation

## 2021-12-29 DIAGNOSIS — R1032 Left lower quadrant pain: Secondary | ICD-10-CM | POA: Diagnosis not present

## 2021-12-29 DIAGNOSIS — K7689 Other specified diseases of liver: Secondary | ICD-10-CM | POA: Diagnosis not present

## 2021-12-29 LAB — POCT I-STAT CREATININE: Creatinine, Ser: 0.8 mg/dL (ref 0.44–1.00)

## 2021-12-29 MED ORDER — IOHEXOL 300 MG/ML  SOLN
100.0000 mL | Freq: Once | INTRAMUSCULAR | Status: AC | PRN
  Administered 2021-12-29: 80 mL via INTRAVENOUS

## 2022-01-01 MED ORDER — RIVASTIGMINE TARTRATE 1.5 MG PO CAPS: ORAL_CAPSULE | ORAL | 4 refills | Status: DC

## 2022-01-11 ENCOUNTER — Other Ambulatory Visit: Payer: Self-pay | Admitting: Internal Medicine

## 2022-01-11 NOTE — Telephone Encounter (Signed)
Patient is requesting 90 day supply.

## 2022-01-16 ENCOUNTER — Non-Acute Institutional Stay: Payer: Medicare Other | Admitting: Internal Medicine

## 2022-01-16 ENCOUNTER — Encounter: Payer: Self-pay | Admitting: Internal Medicine

## 2022-01-16 VITALS — BP 146/78 | HR 92 | Temp 98.2°F | Resp 18 | Ht 63.5 in | Wt 111.0 lb

## 2022-01-16 DIAGNOSIS — G3184 Mild cognitive impairment, so stated: Secondary | ICD-10-CM | POA: Diagnosis not present

## 2022-01-16 DIAGNOSIS — L089 Local infection of the skin and subcutaneous tissue, unspecified: Secondary | ICD-10-CM | POA: Diagnosis not present

## 2022-01-16 DIAGNOSIS — R634 Abnormal weight loss: Secondary | ICD-10-CM | POA: Diagnosis not present

## 2022-01-16 DIAGNOSIS — E042 Nontoxic multinodular goiter: Secondary | ICD-10-CM

## 2022-01-16 DIAGNOSIS — K219 Gastro-esophageal reflux disease without esophagitis: Secondary | ICD-10-CM

## 2022-01-16 DIAGNOSIS — F5101 Primary insomnia: Secondary | ICD-10-CM

## 2022-01-16 DIAGNOSIS — T148XXA Other injury of unspecified body region, initial encounter: Secondary | ICD-10-CM

## 2022-01-16 DIAGNOSIS — E785 Hyperlipidemia, unspecified: Secondary | ICD-10-CM | POA: Diagnosis not present

## 2022-01-16 DIAGNOSIS — F418 Other specified anxiety disorders: Secondary | ICD-10-CM

## 2022-01-16 DIAGNOSIS — M1712 Unilateral primary osteoarthritis, left knee: Secondary | ICD-10-CM

## 2022-01-16 DIAGNOSIS — R0982 Postnasal drip: Secondary | ICD-10-CM | POA: Diagnosis not present

## 2022-01-16 NOTE — Progress Notes (Unsigned)
Location:  [place] of Service:  Clinic (12)  Provider:   Code Status:  Goals of Care:     12/19/2021    4:47 PM  Advanced Directives  Does Patient Have a Medical Advance Directive? Yes  Type of Paramedic of [place];Living will;Out of facility DNR (pink MOST or yellow form)  Does patient want to make changes to medical advance directive? No - Patient declined  Copy of [place] in Chart? No - copy requested     Chief Complaint  Patient presents with   Medical Management of Chronic Issues    4 week follow up and a would like her leg and ankle checked    HPI: Patient is a 87 y.o. female seen today for medical management of chronic diseases.    Lives in [place] in [place] with her Husband  Past Medical History:  Diagnosis Date   Aortic aneurysm ([place])    Arthritis    OA BOTH KNEES AND HANDS   Atrial tachycardia    HX OF   GERD (gastroesophageal reflux disease)    Glaucoma    left eye   Goiter    CAUSING COUGH, HOARSINESS AND DRY THROAT   Headache(784.0)    MIGRAINES    Hyperlipidemia    Neuralgia    Overactive bladder    RLS (restless legs syndrome)    Temporal arteritis ([place])     Past Surgical History:  Procedure Laterality Date   ARTERY BIOPSY Right 07/21/2012   Procedure: BIOPSY TEMPORAL ARTERY;  Surgeon: Haywood Lasso, MD;  Location: [facility];  Service: General;  Laterality: Right;   CERVICAL FUSION  2012   Dr. Vertell Limber   EYE SURGERY     CATARACT EXTRACTION- BILATERAL   INCONTINENCE SURGERY     JOINT REPLACEMENT     right knee   LEFT SHOULDER SURGERY     RIGHT KNEE ARTHROSCOPY  11/2009     ROBOTIC ASSISTED BILATERAL SALPINGO OOPHERECTOMY Bilateral 12/08/2013   Procedure: ROBOTIC ASSISTED LAPAROSCOPIC BILATERAL SALPINGO OOPHORECTOMY WITH STAGING;  Surgeon: Everitt Amber, MD;  Location: [facility];  Service: Gynecology;  Laterality: Bilateral;   TOTAL KNEE ARTHROPLASTY  07/04/2011   Procedure:  TOTAL KNEE ARTHROPLASTY;  Surgeon: Gearlean Alf, MD;  Location[facility];  Service: Orthopedics;  Laterality: Right;    Allergies  Allergen Reactions   Lisinopril Cough    Cough and very low BP , very tired   Oxycodone Anaphylaxis   Mold Extract [Trichophyton]     headache migraine   Molds & Smuts     Other reaction(s): Other (See Comments)   Codeine Nausea Only    Outpatient Encounter Medications as of 01/16/2022  Medication Sig   BRIMONIDINE TARTRATE EX Apply topically.   Calcium Carbonate-Vitamin D (CALCIUM PLUS VITAMIN D PO) Take 1 tablet by mouth daily.   Coenzyme Q10 (CO Q 10) 100 MG CAPS Take 1 tablet by mouth at bedtime.    methimazole (TAPAZOLE) 5 MG tablet Take 5 mg by mouth every other day.   mirtazapine (REMERON) 7.5 MG tablet TAKE 1 TABLET BY MOUTH AT BEDTIME.   Omega-3 Fatty Acids (FISH OIL) 1200 MG CAPS Take 1 capsule by mouth daily.    omeprazole (PRILOSEC) 40 MG capsule Take 1 capsule (40 mg total) by mouth daily.   rivastigmine (EXELON) 1.5 MG capsule TAKE 1 CAPSULE EACH MORNING.   rivastigmine (EXELON) 3 MG capsule TAKE 1 CAPSULE (3 MG TOTAL)  BY MOUTH AT BEDTIME.   rosuvastatin (CRESTOR) 10 MG tablet TAKE 1 TABLET BY MOUTH EVERY DAY   No facility-administered encounter medications on file as of 01/16/2022.    Review of Systems:  Review of Systems  Health Maintenance  Topic Date Due   Medicare Annual Wellness (AWV)  04/18/2021   INFLUENZA VACCINE  09/12/2021   COVID-19 Vaccine (2 - Moderna risk series) 12/20/2021   DTaP/Tdap/Td (2 - Tdap) 12/12/2027   Pneumonia Vaccine 41+ Years old  Completed   DEXA SCAN  Completed   Zoster Vaccines- Shingrix  Completed   HPV VACCINES  Aged Out    Physical Exam: Vitals:   01/16/22 1554  BP: (!) 146/78  Pulse: 92  Resp: 18  Temp: 98.2 F (36.8 C)  TempSrc: Temporal  SpO2: 96%  Weight: 111 lb (50.3 kg)  Height: 5' 3.5" (1.613 m)   Body mass index is 19.35 kg/m. Physical Exam  Labs reviewed: Basic  Metabolic Panel: Recent Labs    10/31/21 1329 11/05/21 2127 11/09/21 1452 12/29/21 1255  NA 140 139 139  --   K 4.5 4.5 4.1  --   CL 103 105 101  --   CO2 '23 26 25  '$ --   GLUCOSE 115* 114* 91  --   BUN 25 43* 23  --   CREATININE 0.85 1.08* 0.78 0.80  CALCIUM 9.2 10.0 9.6  --    Liver Function Tests: Recent Labs    11/05/21 2127  AST 15  ALT 13  ALKPHOS 76  BILITOT 0.5  PROT 6.9  ALBUMIN 4.3   No results for input(s): "LIPASE", "AMYLASE" in the last 8760 hours. No results for input(s): "AMMONIA" in the last 8760 hours. CBC: Recent Labs    11/05/21 2127  WBC 6.3  NEUTROABS 4.1  HGB 13.8  HCT 41.5  MCV 96.1  PLT 156   Lipid Panel: No results for input(s): "CHOL", "HDL", "LDLCALC", "TRIG", "CHOLHDL", "LDLDIRECT" in the last 8760 hours. Lab Results  Component Value Date   HGBA1C 5.4 05/21/2014    Procedures since last visit: CT ABDOMEN PELVIS W CONTRAST  Result Date: 12/29/2021 CLINICAL DATA:  Left lower quadrant abdominal pain.  Weight loss. EXAM: CT ABDOMEN AND PELVIS WITH CONTRAST TECHNIQUE: Multidetector CT imaging of the abdomen and pelvis was performed using the standard protocol following bolus administration of intravenous contrast. RADIATION DOSE REDUCTION: This exam was performed according to the departmental dose-optimization program which includes automated exposure control, adjustment of the mA and/or kV according to patient size and/or use of iterative reconstruction technique. CONTRAST:  17m OMNIPAQUE IOHEXOL 300 MG/ML  SOLN COMPARISON:  01/14/2020 CT angiogram and CTA chest from 10/04/2021 FINDINGS: Lower chest: Moderate cardiomegaly. The bottom of the ascending thoracic aortic aneurysm is visible on today's exam, but is better quantified more superiorly as on the chest CT of 10/04/2021. Mitral and aortic valve calcification noted along with right coronary artery and descending thoracic aortic atherosclerotic calcification. Mild atelectasis or scarring in  both lower lobes and in the lingula. Hepatobiliary: Stable 7 by 5 mm hypodense lesion anteriorly in the liver on image 12 series 4, probably a cyst or similar benign lesion although technically too small to characterize. Similar small suspected cyst along the inferior tip of the right hepatic lobe on image 53 series 5. Gallbladder unremarkable. Pancreas: Unremarkable Spleen: Unremarkable Adrenals/Urinary Tract: Adrenal glands unremarkable. Benign right mid kidney parenchymal system bilateral peripelvic cysts warranting no further imaging workup. Urinary bladder unremarkable. Stomach/Bowel: The possible visceral  aneurysm along the GE junction seen on the prior CTA chest is no longer visible. Significance uncertain. Appendix unremarkable. Mild prominence of stool in the distal colon raising the possibility of constipation. No dilated small bowel. Vascular/Lymphatic: Atherosclerosis is present, including aortoiliac atherosclerotic disease. There is some plaque mildly narrowing the proximal celiac trunk. Minimal plaque at the origin of the superior mesenteric artery which appears otherwise patent. We again observe a densely calcified 3 cm segment of the splenic artery in the left upper quadrant along the margin of the pancreas No pathologic adenopathy observed. Reproductive: Uterine fibroids, largest eccentric to the left measuring about 2.6 cm in diameter. Other: No supplemental non-categorized findings. Musculoskeletal: 6 mm degenerative anterolisthesis at L4-5. Disc uncovering and ligamentum flavum redundancy at this level contribute to moderate central narrowing of the thecal sac. Moderate degenerative hip arthropathy bilaterally. IMPRESSION: 1. Prominent stool in the distal colon raising the possibility of constipation. Aside from this, a specific cause for the patient's left lower quadrant abdominal pain and weight loss is not identified. 2. The possible visceral aneurysm along the GE junction seen on the prior  CTA chest is no longer visible. 3. Moderate cardiomegaly. 4. The bottom of the ascending thoracic aortic aneurysm is visible on today's exam, but is better quantified more superiorly as on the prior chest CT of 10/04/2021. 5. Uterine fibroids. 6. Moderate central narrowing of the thecal sac at L4-5 due to disc uncovering and ligamentum flavum redundancy. 7. Aortic atherosclerosis. Aortic Atherosclerosis (ICD10-I70.0). Electronically Signed   By: Van Clines M.D.   On: 12/29/2021 13:32    Assessment/Plan There are no diagnoses linked to this encounter.   Labs/tests ordered:  * No order type specified * Next appt:  Visit date not found

## 2022-01-21 DIAGNOSIS — F0390 Unspecified dementia without behavioral disturbance: Secondary | ICD-10-CM | POA: Insufficient documentation

## 2022-01-21 DIAGNOSIS — G3184 Mild cognitive impairment, so stated: Secondary | ICD-10-CM | POA: Insufficient documentation

## 2022-01-30 ENCOUNTER — Other Ambulatory Visit: Payer: Medicare Other

## 2022-02-08 DIAGNOSIS — M816 Localized osteoporosis [Lequesne]: Secondary | ICD-10-CM | POA: Diagnosis not present

## 2022-02-08 DIAGNOSIS — Z1231 Encounter for screening mammogram for malignant neoplasm of breast: Secondary | ICD-10-CM | POA: Diagnosis not present

## 2022-03-08 DIAGNOSIS — M8580 Other specified disorders of bone density and structure, unspecified site: Secondary | ICD-10-CM | POA: Diagnosis not present

## 2022-03-08 DIAGNOSIS — M8588 Other specified disorders of bone density and structure, other site: Secondary | ICD-10-CM | POA: Diagnosis not present

## 2022-03-19 DIAGNOSIS — H401422 Capsular glaucoma with pseudoexfoliation of lens, left eye, moderate stage: Secondary | ICD-10-CM | POA: Diagnosis not present

## 2022-03-30 ENCOUNTER — Other Ambulatory Visit: Payer: Self-pay | Admitting: Internal Medicine

## 2022-04-02 ENCOUNTER — Inpatient Hospital Stay (HOSPITAL_COMMUNITY)
Admission: EM | Admit: 2022-04-02 | Discharge: 2022-04-05 | DRG: 521 | Disposition: A | Discharge status: Skilled Nursing Facility | Payer: Medicare Other | Attending: Internal Medicine | Admitting: Internal Medicine

## 2022-04-02 ENCOUNTER — Other Ambulatory Visit: Payer: Self-pay

## 2022-04-02 ENCOUNTER — Emergency Department (HOSPITAL_COMMUNITY): Payer: Medicare Other

## 2022-04-02 DIAGNOSIS — G2581 Restless legs syndrome: Secondary | ICD-10-CM | POA: Diagnosis present

## 2022-04-02 DIAGNOSIS — Z8249 Family history of ischemic heart disease and other diseases of the circulatory system: Secondary | ICD-10-CM | POA: Diagnosis not present

## 2022-04-02 DIAGNOSIS — I4719 Other supraventricular tachycardia: Secondary | ICD-10-CM | POA: Diagnosis present

## 2022-04-02 DIAGNOSIS — W19XXXA Unspecified fall, initial encounter: Secondary | ICD-10-CM | POA: Diagnosis not present

## 2022-04-02 DIAGNOSIS — S72002A Fracture of unspecified part of neck of left femur, initial encounter for closed fracture: Secondary | ICD-10-CM | POA: Diagnosis not present

## 2022-04-02 DIAGNOSIS — I10 Essential (primary) hypertension: Secondary | ICD-10-CM | POA: Diagnosis not present

## 2022-04-02 DIAGNOSIS — Z66 Do not resuscitate: Secondary | ICD-10-CM | POA: Diagnosis not present

## 2022-04-02 DIAGNOSIS — W07XXXA Fall from chair, initial encounter: Secondary | ICD-10-CM | POA: Diagnosis present

## 2022-04-02 DIAGNOSIS — I351 Nonrheumatic aortic (valve) insufficiency: Secondary | ICD-10-CM | POA: Diagnosis not present

## 2022-04-02 DIAGNOSIS — T40415A Adverse effect of fentanyl or fentanyl analogs, initial encounter: Secondary | ICD-10-CM | POA: Diagnosis not present

## 2022-04-02 DIAGNOSIS — I7121 Aneurysm of the ascending aorta, without rupture: Secondary | ICD-10-CM | POA: Diagnosis not present

## 2022-04-02 DIAGNOSIS — Z681 Body mass index (BMI) 19 or less, adult: Secondary | ICD-10-CM | POA: Diagnosis not present

## 2022-04-02 DIAGNOSIS — E0590 Thyrotoxicosis, unspecified without thyrotoxic crisis or storm: Secondary | ICD-10-CM | POA: Diagnosis not present

## 2022-04-02 DIAGNOSIS — M8580 Other specified disorders of bone density and structure, unspecified site: Secondary | ICD-10-CM | POA: Diagnosis not present

## 2022-04-02 DIAGNOSIS — Z01810 Encounter for preprocedural cardiovascular examination: Secondary | ICD-10-CM | POA: Diagnosis not present

## 2022-04-02 DIAGNOSIS — Z888 Allergy status to other drugs, medicaments and biological substances status: Secondary | ICD-10-CM

## 2022-04-02 DIAGNOSIS — E7800 Pure hypercholesterolemia, unspecified: Secondary | ICD-10-CM | POA: Diagnosis not present

## 2022-04-02 DIAGNOSIS — D696 Thrombocytopenia, unspecified: Secondary | ICD-10-CM | POA: Diagnosis present

## 2022-04-02 DIAGNOSIS — Z820 Family history of epilepsy and other diseases of the nervous system: Secondary | ICD-10-CM

## 2022-04-02 DIAGNOSIS — I6782 Cerebral ischemia: Secondary | ICD-10-CM | POA: Diagnosis not present

## 2022-04-02 DIAGNOSIS — Z79899 Other long term (current) drug therapy: Secondary | ICD-10-CM | POA: Diagnosis not present

## 2022-04-02 DIAGNOSIS — Z96651 Presence of right artificial knee joint: Secondary | ICD-10-CM | POA: Diagnosis present

## 2022-04-02 DIAGNOSIS — I447 Left bundle-branch block, unspecified: Secondary | ICD-10-CM | POA: Diagnosis not present

## 2022-04-02 DIAGNOSIS — Z885 Allergy status to narcotic agent status: Secondary | ICD-10-CM

## 2022-04-02 DIAGNOSIS — F0390 Unspecified dementia without behavioral disturbance: Secondary | ICD-10-CM | POA: Diagnosis present

## 2022-04-02 DIAGNOSIS — Z7401 Bed confinement status: Secondary | ICD-10-CM | POA: Diagnosis not present

## 2022-04-02 DIAGNOSIS — I359 Nonrheumatic aortic valve disorder, unspecified: Secondary | ICD-10-CM

## 2022-04-02 DIAGNOSIS — K219 Gastro-esophageal reflux disease without esophagitis: Secondary | ICD-10-CM | POA: Diagnosis present

## 2022-04-02 DIAGNOSIS — H409 Unspecified glaucoma: Secondary | ICD-10-CM | POA: Diagnosis not present

## 2022-04-02 DIAGNOSIS — I352 Nonrheumatic aortic (valve) stenosis with insufficiency: Secondary | ICD-10-CM | POA: Diagnosis not present

## 2022-04-02 DIAGNOSIS — R079 Chest pain, unspecified: Secondary | ICD-10-CM | POA: Diagnosis not present

## 2022-04-02 DIAGNOSIS — Y9289 Other specified places as the place of occurrence of the external cause: Secondary | ICD-10-CM | POA: Diagnosis not present

## 2022-04-02 DIAGNOSIS — F03A0 Unspecified dementia, mild, without behavioral disturbance, psychotic disturbance, mood disturbance, and anxiety: Secondary | ICD-10-CM

## 2022-04-02 DIAGNOSIS — S72042A Displaced fracture of base of neck of left femur, initial encounter for closed fracture: Secondary | ICD-10-CM | POA: Diagnosis not present

## 2022-04-02 DIAGNOSIS — Z01818 Encounter for other preprocedural examination: Secondary | ICD-10-CM | POA: Diagnosis not present

## 2022-04-02 DIAGNOSIS — S72012A Unspecified intracapsular fracture of left femur, initial encounter for closed fracture: Secondary | ICD-10-CM | POA: Diagnosis not present

## 2022-04-02 DIAGNOSIS — J9601 Acute respiratory failure with hypoxia: Secondary | ICD-10-CM | POA: Diagnosis present

## 2022-04-02 DIAGNOSIS — M25552 Pain in left hip: Secondary | ICD-10-CM | POA: Diagnosis not present

## 2022-04-02 DIAGNOSIS — R531 Weakness: Secondary | ICD-10-CM | POA: Diagnosis not present

## 2022-04-02 DIAGNOSIS — I719 Aortic aneurysm of unspecified site, without rupture: Secondary | ICD-10-CM | POA: Diagnosis present

## 2022-04-02 DIAGNOSIS — I2510 Atherosclerotic heart disease of native coronary artery without angina pectoris: Secondary | ICD-10-CM | POA: Diagnosis not present

## 2022-04-02 DIAGNOSIS — Z043 Encounter for examination and observation following other accident: Secondary | ICD-10-CM | POA: Diagnosis not present

## 2022-04-02 DIAGNOSIS — F418 Other specified anxiety disorders: Secondary | ICD-10-CM | POA: Diagnosis not present

## 2022-04-02 DIAGNOSIS — R059 Cough, unspecified: Secondary | ICD-10-CM | POA: Diagnosis not present

## 2022-04-02 DIAGNOSIS — E440 Moderate protein-calorie malnutrition: Secondary | ICD-10-CM | POA: Diagnosis not present

## 2022-04-02 DIAGNOSIS — I350 Nonrheumatic aortic (valve) stenosis: Secondary | ICD-10-CM | POA: Diagnosis not present

## 2022-04-02 DIAGNOSIS — D352 Benign neoplasm of pituitary gland: Secondary | ICD-10-CM | POA: Diagnosis not present

## 2022-04-02 DIAGNOSIS — I712 Thoracic aortic aneurysm, without rupture, unspecified: Secondary | ICD-10-CM | POA: Diagnosis not present

## 2022-04-02 DIAGNOSIS — R9431 Abnormal electrocardiogram [ECG] [EKG]: Secondary | ICD-10-CM | POA: Diagnosis not present

## 2022-04-02 LAB — URINALYSIS, COMPLETE (UACMP) WITH MICROSCOPIC
Bacteria, UA: NONE SEEN
Bilirubin Urine: NEGATIVE
Glucose, UA: NEGATIVE mg/dL
Hgb urine dipstick: NEGATIVE
Ketones, ur: 5 mg/dL — AB
Leukocytes,Ua: NEGATIVE
Nitrite: NEGATIVE
Protein, ur: NEGATIVE mg/dL
Specific Gravity, Urine: 1.025 (ref 1.005–1.030)
pH: 5 (ref 5.0–8.0)

## 2022-04-02 LAB — HEPATIC FUNCTION PANEL
ALT: 20 U/L (ref 0–44)
AST: 25 U/L (ref 15–41)
Albumin: 3.6 g/dL (ref 3.5–5.0)
Alkaline Phosphatase: 58 U/L (ref 38–126)
Bilirubin, Direct: 0.1 mg/dL (ref 0.0–0.2)
Indirect Bilirubin: 0.5 mg/dL (ref 0.3–0.9)
Total Bilirubin: 0.6 mg/dL (ref 0.3–1.2)
Total Protein: 6.2 g/dL — ABNORMAL LOW (ref 6.5–8.1)

## 2022-04-02 LAB — CBC WITH DIFFERENTIAL/PLATELET
Abs Immature Granulocytes: 0.03 10*3/uL (ref 0.00–0.07)
Basophils Absolute: 0 10*3/uL (ref 0.0–0.1)
Basophils Relative: 0 %
Eosinophils Absolute: 0.1 10*3/uL (ref 0.0–0.5)
Eosinophils Relative: 1 %
HCT: 38.9 % (ref 36.0–46.0)
Hemoglobin: 12.2 g/dL (ref 12.0–15.0)
Immature Granulocytes: 1 %
Lymphocytes Relative: 17 %
Lymphs Abs: 1 10*3/uL (ref 0.7–4.0)
MCH: 30.7 pg (ref 26.0–34.0)
MCHC: 31.4 g/dL (ref 30.0–36.0)
MCV: 97.7 fL (ref 80.0–100.0)
Monocytes Absolute: 0.4 10*3/uL (ref 0.1–1.0)
Monocytes Relative: 6 %
Neutro Abs: 4.1 10*3/uL (ref 1.7–7.7)
Neutrophils Relative %: 75 %
Platelets: 116 10*3/uL — ABNORMAL LOW (ref 150–400)
RBC: 3.98 MIL/uL (ref 3.87–5.11)
RDW: 13.2 % (ref 11.5–15.5)
WBC: 5.5 10*3/uL (ref 4.0–10.5)
nRBC: 0 % (ref 0.0–0.2)

## 2022-04-02 LAB — BASIC METABOLIC PANEL
Anion gap: 8 (ref 5–15)
BUN: 25 mg/dL — ABNORMAL HIGH (ref 8–23)
CO2: 26 mmol/L (ref 22–32)
Calcium: 8.5 mg/dL — ABNORMAL LOW (ref 8.9–10.3)
Chloride: 110 mmol/L (ref 98–111)
Creatinine, Ser: 0.6 mg/dL (ref 0.44–1.00)
GFR, Estimated: >60 mL/min (ref >60)
Glucose, Bld: 86 mg/dL (ref 70–99)
Potassium: 3.7 mmol/L (ref 3.5–5.1)
Sodium: 144 mmol/L (ref 135–145)

## 2022-04-02 LAB — TROPONIN I (HIGH SENSITIVITY): Troponin I (High Sensitivity): 4 ng/L (ref <18)

## 2022-04-02 LAB — CK: Total CK: 89 U/L (ref 38–234)

## 2022-04-02 LAB — PHOSPHORUS: Phosphorus: 3.2 mg/dL (ref 2.5–4.6)

## 2022-04-02 LAB — PROTIME-INR
INR: 1.1 (ref 0.8–1.2)
Prothrombin Time: 13.9 seconds (ref 11.4–15.2)

## 2022-04-02 LAB — MAGNESIUM: Magnesium: 1.9 mg/dL (ref 1.7–2.4)

## 2022-04-02 MED ORDER — ROSUVASTATIN CALCIUM 10 MG PO TABS
10.0000 mg | ORAL_TABLET | Freq: Every day | ORAL | Status: DC
  Administered 2022-04-04 – 2022-04-05 (×2): 10 mg via ORAL
  Filled 2022-04-02 (×3): qty 1

## 2022-04-02 MED ORDER — METHOCARBAMOL 500 MG PO TABS: 500.0000 mg | ORAL_TABLET | Freq: Four times a day (QID) | ORAL | Status: DC | PRN

## 2022-04-02 MED ORDER — MIRTAZAPINE 15 MG PO TABS
7.5000 mg | ORAL_TABLET | Freq: Every day | ORAL | Status: DC
  Administered 2022-04-02 – 2022-04-04 (×3): 7.5 mg via ORAL
  Filled 2022-04-02 (×3): qty 1

## 2022-04-02 MED ORDER — HYDROCODONE-ACETAMINOPHEN 5-325 MG PO TABS: 1.0000 | ORAL_TABLET | Freq: Four times a day (QID) | ORAL | Status: DC | PRN

## 2022-04-02 MED ORDER — FENTANYL CITRATE PF 50 MCG/ML IJ SOSY
50.0000 ug | PREFILLED_SYRINGE | INTRAMUSCULAR | Status: DC | PRN
  Administered 2022-04-02: 50 ug via INTRAVENOUS
  Filled 2022-04-02: qty 1

## 2022-04-02 MED ORDER — ONDANSETRON HCL 4 MG/2ML IJ SOLN
4.0000 mg | Freq: Once | INTRAMUSCULAR | Status: AC
  Administered 2022-04-02: 4 mg via INTRAVENOUS
  Filled 2022-04-02: qty 2

## 2022-04-02 MED ORDER — METHOCARBAMOL 1000 MG/10ML IJ SOLN: 500.0000 mg | Freq: Four times a day (QID) | INTRAVENOUS | Status: DC | PRN

## 2022-04-02 MED ORDER — MORPHINE SULFATE (PF) 2 MG/ML IV SOLN: 0.5000 mg | INTRAVENOUS | Status: DC | PRN

## 2022-04-02 NOTE — Assessment & Plan Note (Signed)
Monitor for any sundowning while hospitalized

## 2022-04-02 NOTE — ED Provider Notes (Signed)
[facility] Provider Note   CSN: [id number] Arrival date & time: 04/02/22  1532     History  Chief Complaint  Patient presents with   Hip Pain    Left hip    Janet Wilson is a 88 y.o. female who fell out of a recliner. She was on the floor for about 1 hour before her daughter came home and found her. She hit her head " a bit." She c/o severe pain in the left hip radiating to her groin. No numbness or tingling. She is not on blood thinners.  The history is provided by the patient and a relative.  Hip Pain This is a new problem. The current episode started 1 to 2 hours ago.       Home Medications Prior to Admission medications   Medication Sig Start Date End Date Taking? Authorizing Provider  BRIMONIDINE TARTRATE EX Apply topically.    [provider]  Calcium Carbonate-Vitamin D (CALCIUM PLUS VITAMIN D PO) Take 1 tablet by mouth daily.    [provider]  Coenzyme Q10 (CO Q 10) 100 MG CAPS Take 1 tablet by mouth at bedtime.     [provider]  methimazole (TAPAZOLE) 5 MG tablet Take 5 mg by mouth every other day. 01/11/21   [provider]  mirtazapine (REMERON) 7.5 MG tablet TAKE 1 TABLET BY MOUTH AT BEDTIME. 01/11/22   Virgie Dad, MD  Omega-3 Fatty Acids (FISH OIL) 1200 MG CAPS Take 1 capsule by mouth daily.     [provider]  omeprazole (PRILOSEC) 40 MG capsule TAKE 1 CAPSULE (40 MG TOTAL) BY MOUTH DAILY. 03/30/22   Virgie Dad, MD  rivastigmine (EXELON) 1.5 MG capsule TAKE 1 CAPSULE EACH MORNING. 01/01/22   Virgie Dad, MD  rivastigmine (EXELON) 3 MG capsule TAKE 1 CAPSULE (3 MG TOTAL) BY MOUTH AT BEDTIME. 11/07/21   Melvenia Beam, MD  rosuvastatin (CRESTOR) 10 MG tablet TAKE 1 TABLET BY MOUTH EVERY DAY 12/06/21   Jerline Pain, MD      Allergies    Lisinopril, Oxycodone, Mold extract [trichophyton], Molds & smuts, and Codeine    Review of Systems    Review of Systems  Physical Exam Updated Vital Signs BP (!) 154/71 (BP Location: Left Arm)   Pulse 81   Temp 98.2 F (36.8 C) (Oral)   Resp 16   Ht 5' 3.5" (1.613 m)   Wt 47.6 kg   LMP  (LMP Unknown)   SpO2 97%   BMI 18.31 kg/m  Physical Exam Vitals and nursing note reviewed.  Constitutional:      General: She is not in acute distress.    Appearance: She is well-developed. She is not diaphoretic.  HENT:     Head: Normocephalic and atraumatic.     Right Ear: External ear normal.     Left Ear: External ear normal.     Nose: Nose normal.     Mouth/Throat:     Mouth: Mucous membranes are moist.  Eyes:     General: No scleral icterus.    Conjunctiva/sclera: Conjunctivae normal.  Cardiovascular:     Rate and Rhythm: Normal rate and regular rhythm.     Heart sounds: Normal heart sounds. No murmur heard.    No friction rub. No gallop.  Pulmonary:     Effort: Pulmonary effort is normal. No respiratory distress.     Breath sounds: Normal breath sounds.  Abdominal:     General: Bowel sounds are normal. There is no distension.     Palpations: Abdomen is soft. There is no mass.     Tenderness: There is no abdominal tenderness. There is no guarding.  Musculoskeletal:     Cervical back: Normal range of motion.     Comments: Left leg shortened and externally rotated. 2+ pulse in the Left foot. Knee in flexion  Skin:    General: Skin is warm and dry.  Neurological:     Mental Status: She is alert and oriented to person, place, and time.  Psychiatric:        Behavior: Behavior normal.     ED Results / Procedures / Treatments   Labs (all labs ordered are listed, but only abnormal results are displayed) Labs Reviewed - No data to display  EKG None  Radiology No results found.  Procedures Procedures    Medications Ordered in ED Medications - No data to display  ED Course/ Medical Decision Making/ A&P Clinical Course as of 04/02/22 Myles Rosenthal Apr 02, 2022  123XX123  Basic metabolic panel(!) [AH]  123XX123 CBC with Differential(!) [AH]  1904 Protime-INR [AH]  1904 DG Chest [place] 1 View [AH]  1905 DG Hip Unilat With Pelvis 2-3 Views Left [AH]  1905 ED EKG [AH]    Clinical Course User Index [AH] Margarita Mail, PA-C                             Medical Decision Making Patient here after clinical for with obvious left hip deformity. After review of all data points patient appears to have a left femoral neck fracture. I ordered and reviewed labs that show no acute findings. I ordered preop chest x-ray which shows dilated ectasia of the thoracic aorta consistent with known thoracic aortic aneurysm, Intertrochanteric hip fracture on the left.  Case discussed with Dr. Fredonia Highland who plans on doing surgery tomorrow.  I updated family on plan.  She will remain n.p.o. after midnight. Case also discussed with Dr. Roel Cluck who will admit the patient.  CT is currently behind tonight and there is a long wait.  CT head and C-spine are currently pending.  Will plan for admission.  Amount and/or Complexity of Data Reviewed Labs: ordered. Radiology: ordered.  Risk Prescription drug management. Decision regarding hospitalization.           Final Clinical Impression(s) / ED Diagnoses Final diagnoses:  None    Rx / DC Orders ED Discharge Orders     None         Margarita Mail, PA-C 04/02/22 1906    Godfrey Pick, MD 04/03/22 0200

## 2022-04-02 NOTE — ED Notes (Signed)
Patient transported to CT 

## 2022-04-02 NOTE — H&P (Signed)
Janet Wilson [id number] DOB: 26-Feb-1934 DOA: 04/02/2022   PCP: Virgie Dad, MD   Outpatient Specialists:   CARDS: Dr.Mark Marlou Porch,   CT surgery Dr. Cyndia Bent NEurology   Dr. Rexene Alberts    Patient arrived to ER on 04/02/22 at 1532 Referred by Attending Godfrey Pick, MD   Patient coming from:    home Lives independent living  With family  [facility]  Chief Complaint:   Chief Complaint  Patient presents with   Hip Pain    Left hip    HPI: Janet Wilson is a 88 y.o. female with medical history significant of ascending aortic aneurysm 5.7 cm conservative management history nonobstructive CAD aortic regurgitation left bundle branch block atrial tachycardia GERD glaucoma goiter migraines HLD neurology restless leg syndrome and temporal arteritis    Presented with   left hip pain after fall Slid down the chair and injured her left hip, she hit her head as well , not on anticoagulation  While in ER pt briefly became hypoxic down to 73% on room air once patient started to brief normally again O2 up to 88% she was started 2 L this was in association with narcotic medication for pain patient was found to have left hip fracture and seen in consult by orthopedics Walks independently  Pt states it was mechanical fall she slid off the recliner  No LOC  No CP SOB  Unable to walk a flight of stairs due to knee pain but could walk a  block  Does not smoke or drink    Lab Results  Component Value Date   [place] NEGATIVE 11/05/2021     Regarding pertinent Chronic problems:     Hyperlipidemia -  on statins Crestor Lipid Panel     Component Value Date/Time   CHOL 134 03/18/2020 0745   TRIG 39 03/18/2020 0745   HDL 67 03/18/2020 0745   CHOLHDL 2.0 03/18/2020 0745   LDLCALC 57 03/18/2020 0745   LABVLDL 10 03/18/2020 0745     Valvular disease- last echo 2021 Ascending aorta dilated at 55 mm but normal EF mild mitral valve regurgitation and mild to  mild aortic valve stenosis noted on echogram from 2021  Ascending aortic aneurysm measuring 5.6 x 5.6 cm in caliber based on CT angio 2023 unchanged from prior    CAD  - On   statin,                   -  followed by cardiology                   Hyperthyroidism:  Lab Results  Component Value Date   TSH 0.46 12/11/2017   No longer On Tapazole    While in ER: Clinical Course as of 04/02/22 1907  Mon Apr 02, 2022  123XX123 Basic metabolic panel(!) [AH]  123XX123 CBC with Differential(!) [AH]  1904 Protime-INR [AH]  1904 DG Chest Plain Dealing 1 View [AH]  1905 DG Hip Unilat With Pelvis 2-3 Views Left [AH]  1905 ED EKG [AH]    Clinical Course User Index [AH] Margarita Mail, PA-C   Patient found to have subcapital femoral neck fracture.  Dr. Percell Miller was also plan to operate in a.m.    Displaced subcapital femoral neck fracture on the left  CT HEAD  Ordered  CXR - 1. Continued dilation and ectasia of the thoracic aorta consistent with known thoracic aortic aneurysm. 2. No acute airspace disease.    Following Medications  were ordered in ER: Medications  fentaNYL (SUBLIMAZE) injection 50 mcg (50 mcg Intravenous Given 04/02/22 1642)  ondansetron (ZOFRAN) injection 4 mg (4 mg Intravenous Given 04/02/22 1642)    _______________________________________________________ ER Provider Called: Orthopedics   Dr. Percell Miller They Recommend admit to medicine    SEEN in ER   ED Triage Vitals  Enc Vitals Group     BP 04/02/22 1600 (!) 154/71     Pulse Rate 04/02/22 1600 81     Resp 04/02/22 1600 16     Temp 04/02/22 1600 98.2 F (36.8 C)     Temp Source 04/02/22 1600 Oral     SpO2 04/02/22 1544 95 %     Weight 04/02/22 1606 105 lb (47.6 kg)     Height 04/02/22 1606 5' 3.5" (1.613 m)     Head Circumference --      Peak Flow --      Pain Score 04/02/22 1606 10     Pain Loc --      Pain Edu? --      Excl. in [place]? --   TMAX(24)@     _________________________________________ Significant initial   Findings: Abnormal Labs Reviewed  BASIC METABOLIC PANEL - Abnormal; Notable for the following components:      Result Value   BUN 25 (*)    Calcium 8.5 (*)    All other components within normal limits  CBC WITH DIFFERENTIAL/PLATELET - Abnormal; Notable for the following components:   Platelets 116 (*)    All other components within normal limits    _________________________ Troponin  ordered ECG: Ordered Personally reviewed and interpreted by me showing: HR : 70 Rhythm: SR,   LBBB,   No change QTC 467    The recent clinical data is shown below. Vitals:   04/02/22 1544 04/02/22 1600 04/02/22 1606 04/02/22 1609  BP:  (!) 154/71  (!) 154/71  Pulse:  81  81  Resp:  16  16  Temp:  98.2 F (36.8 C)  98.2 F (36.8 C)  TempSrc:  Oral  Oral  SpO2: 95% 97%  97%  Weight:   47.6 kg   Height:   5' 3.5" (1.613 m)       WBC     Component Value Date/Time   WBC 5.5 04/02/2022 1650   LYMPHSABS 1.0 04/02/2022 1650   LYMPHSABS 0.5 (L) 07/29/2012 1606   MONOABS 0.4 04/02/2022 1650   EOSABS 0.1 04/02/2022 1650   EOSABS 0.0 07/29/2012 1606   BASOSABS 0.0 04/02/2022 1650   BASOSABS 0.0 07/29/2012 1606       UA   ordered    ______________________________________________ Hospitalist was called for admission for   Closed fracture of left hip, initial encounter ([place])    The following Work up has been ordered so far:  Orders Placed This Encounter  Procedures   CT HEAD WO CONTRAST   CT CERVICAL SPINE WO CONTRAST   DG Hip Unilat With Pelvis 2-3 Views Left   DG Chest Port 1 View   Basic metabolic panel   CBC with Differential   Protime-INR   Diet NPO time specified   Check CMS   Bed rest   Initiate Carrier Fluid Protocol   Diet per anesthesia ENHANCED RECOVERY AFTER SURGERY (ERAS) Pathway.  Clear liquids after midnight until 4:30am. Finish prescribed drink (Pre-Surgery Ensure or G2 if patient is diabetic) by 4:30am. NPO except meds with a sip of water at 4:30am.   Low Sugar G2  for Diabetic  Patients (355 ML)  [SUPPLIED BY DIETARY]   Consult to hospitalist   ED EKG   Type and screen     OTHER Significant initial  Findings:  labs showing:  Recent Labs  Lab 04/02/22 1650  NA 144  K 3.7  CO2 26  GLUCOSE 86  BUN 25*  CREATININE 0.60  CALCIUM 8.5*    Cr    stable,   Lab Results  Component Value Date   CREATININE 0.60 04/02/2022   CREATININE 0.80 12/29/2021   CREATININE 0.78 11/09/2021    No results for input(s): "AST", "ALT", "ALKPHOS", "BILITOT", "PROT", "ALBUMIN" in the last 168 hours. Lab Results  Component Value Date   CALCIUM 8.5 (L) 04/02/2022        Plt: Lab Results  Component Value Date   PLT 116 (L) 04/02/2022    COVID-19 Labs  No results for input(s): "DDIMER", "FERRITIN", "LDH", "CRP" in the last 72 hours.  Lab Results  Component Value Date   SARSCOV2NAA NEGATIVE 11/05/2021       Recent Labs  Lab 04/02/22 1650  WBC 5.5  NEUTROABS 4.1  HGB 12.2  HCT 38.9  MCV 97.7  PLT 116*    HG/HCT  stable     Component Value Date/Time   HGB 12.2 04/02/2022 1650   HGB 13.3 07/15/2017 1500   HCT 38.9 04/02/2022 1650   HCT 40.3 07/15/2017 1500   MCV 97.7 04/02/2022 1650   MCV 93 07/15/2017 1500          Cultures: No results found for: "SDES", "SPECREQUEST", "CULT", "REPTSTATUS"   Radiological Exams on Admission: CT CERVICAL SPINE WO CONTRAST  Result Date: 04/02/2022 CLINICAL DATA:  [place] from chair EXAM: CT CERVICAL SPINE WITHOUT CONTRAST TECHNIQUE: Multidetector CT imaging of the cervical spine was performed without intravenous contrast. Multiplanar CT image reconstructions were also generated. RADIATION DOSE REDUCTION: This exam was performed according to the departmental dose-optimization program which includes automated exposure control, adjustment of the mA and/or kV according to patient size and/or use of iterative reconstruction technique. COMPARISON:  02/23/2021 FINDINGS: Alignment: Stable mild degenerative  anterolisthesis of C7 on T1. Otherwise alignment is grossly anatomic. Skull base and vertebrae: No acute fracture. No primary bone lesion or focal pathologic process. Soft tissues and spinal canal: No prevertebral fluid or swelling. No visible canal hematoma. Disc levels: Prior ACDF spanning C4 through C7. There is mild diffuse spondylosis and facet hypertrophy throughout the nonsurgical levels. No change since prior exam. Upper chest: Airway is patent. Lung apices are clear. Known thyroid goiter, previously evaluated by ultrasound and biopsy. Other: Reconstructed images demonstrate no additional findings. IMPRESSION: 1. No acute cervical spine fracture. 2. Stable postsurgical and degenerative changes of the cervical spine. Electronically Signed   By: Randa Ngo M.D.   On: 04/02/2022 19:28   CT HEAD WO CONTRAST  Result Date: 04/02/2022 CLINICAL DATA:  [place] from chair EXAM: CT HEAD WITHOUT CONTRAST TECHNIQUE: Contiguous axial images were obtained from the base of the skull through the vertex without intravenous contrast. RADIATION DOSE REDUCTION: This exam was performed according to the departmental dose-optimization program which includes automated exposure control, adjustment of the mA and/or kV according to patient size and/or use of iterative reconstruction technique. COMPARISON:  02/23/2021 FINDINGS: Brain: Chronic small vessel ischemic changes within the white matter and bilateral basal ganglia. No acute infarct or hemorrhage. Lateral ventricles and midline structures are unremarkable. No acute extra-axial fluid collections. No mass effect. Vascular: No hyperdense vessel or unexpected calcification. Skull: Normal.  Negative for fracture or focal lesion. Sinuses/Orbits: Chronic left sphenoid sinus disease. The remaining paranasal sinuses are clear. Other: None. IMPRESSION: 1. No acute intracranial process. Electronically Signed   By: Randa Ngo M.D.   On: 04/02/2022 19:25   DG Chest Port 1  View  Result Date: 04/02/2022 CLINICAL DATA:  Pain, left hip fracture, preoperative evaluation EXAM: PORTABLE CHEST 1 VIEW COMPARISON:  11/05/2021, 10/04/2021 FINDINGS: Single frontal view of the chest demonstrates stable enlargement of the cardiac silhouette. Dilated ectatic thoracic aorta compatible with known ascending thoracic aortic aneurysm. Increased density in the right paratracheal region consistent with vascular shadow and thyroid. No acute airspace disease, effusion, or pneumothorax. No acute bony abnormality. IMPRESSION: 1. Continued dilation and ectasia of the thoracic aorta consistent with known thoracic aortic aneurysm. 2. No acute airspace disease. Electronically Signed   By: Randa Ngo M.D.   On: 04/02/2022 18:54   DG Hip Unilat With Pelvis 2-3 Views Left  Result Date: 04/02/2022 CLINICAL DATA:  Pain after fall EXAM: DG HIP (WITH OR WITHOUT PELVIS) 3V LEFT COMPARISON:  None Available. FINDINGS: Portable x-rays. Left femoral neck fracture with displacement, foreshortening and angulation. Underlying osteopenia. Concentric joint space loss of both hips. Degenerative changes. Foley catheter. IMPRESSION: Displaced subcapital femoral neck fracture. Displaced, impacted femoral neck fracture on the left Electronically Signed   By: Jill Side M.D.   On: 04/02/2022 17:23   _______________________________________________________________________________________________________ Latest  Blood pressure (!) 154/71, pulse 81, temperature 98.2 F (36.8 C), temperature source Oral, resp. rate 16, height 5' 3.5" (1.613 m), weight 47.6 kg, SpO2 97 %.   Vitals  labs and radiology finding personally reviewed  Review of Systems:    Pertinent positives include:  fatigue, fall  Constitutional:  No weight loss, night sweats, Fevers, chills,  weight loss  HEENT:  No headaches, Difficulty swallowing,Tooth/dental problems,Sore throat,  No sneezing, itching, ear ache, nasal congestion, post nasal drip,   Cardio-vascular:  No chest pain, Orthopnea, PND, anasarca, dizziness, palpitations.no Bilateral lower extremity swelling  GI:  No heartburn, indigestion, abdominal pain, nausea, vomiting, diarrhea, change in bowel habits, loss of appetite, melena, blood in stool, hematemesis Resp:  no shortness of breath at rest. No dyspnea on exertion, No excess mucus, no productive cough, No non-productive cough, No coughing up of blood.No change in color of mucus.No wheezing. Skin:  no rash or lesions. No jaundice GU:  no dysuria, change in color of urine, no urgency or frequency. No straining to urinate.  No flank pain.  Musculoskeletal:  No joint pain or no joint swelling. No decreased range of motion. No back pain.  Psych:  No change in mood or affect. No depression or anxiety. No memory loss.  Neuro: no localizing neurological complaints, no tingling, no weakness, no double vision, no gait abnormality, no slurred speech, no confusion  All systems reviewed and apart from [place] all are negative _______________________________________________________________________________________________ Past Medical History:   Past Medical History:  Diagnosis Date   Aortic aneurysm (HCC)    Arthritis    OA BOTH KNEES AND HANDS   Atrial tachycardia    HX OF   GERD (gastroesophageal reflux disease)    Glaucoma    left eye   Goiter    CAUSING COUGH, HOARSINESS AND DRY THROAT   Headache(784.0)    MIGRAINES    Hyperlipidemia    Neuralgia    Overactive bladder    RLS (restless legs syndrome)    Temporal arteritis (HCC)     Past Surgical History:  Procedure Laterality Date   ARTERY BIOPSY Right 07/21/2012   Procedure: BIOPSY TEMPORAL ARTERY;  Surgeon: Haywood Lasso, MD;  Location: [place];  Service: General;  Laterality: Right;   CERVICAL FUSION  2012   Dr. Vertell Limber   EYE SURGERY     CATARACT EXTRACTION- BILATERAL   INCONTINENCE SURGERY     JOINT REPLACEMENT     right knee   LEFT SHOULDER SURGERY      RIGHT KNEE ARTHROSCOPY  11/2009     ROBOTIC ASSISTED BILATERAL SALPINGO OOPHERECTOMY Bilateral 12/08/2013   Procedure: ROBOTIC ASSISTED LAPAROSCOPIC BILATERAL SALPINGO OOPHORECTOMY WITH STAGING;  Surgeon: Everitt Amber, MD;  Location: [facility];  Service: Gynecology;  Laterality: Bilateral;   TOTAL KNEE ARTHROPLASTY  07/04/2011   Procedure: TOTAL KNEE ARTHROPLASTY;  Surgeon: Gearlean Alf, MD;  Location: [facility];  Service: Orthopedics;  Laterality: Right;    Social History:  Ambulatory   independently       reports that she has never smoked. She has never used smokeless tobacco. She reports current alcohol use of about 1.0 standard drink of alcohol per week. She reports that she does not use drugs.    Family History:  Family History  Problem Relation Age of Onset   Alzheimer's disease Mother    Heart attack Father    ______________________________________________________________________________________________ Allergies: Allergies  Allergen Reactions   Lisinopril Cough    Cough and very low BP , very tired   Oxycodone Anaphylaxis   Mold Extract [[place]ophy[place]]     headache migraine   Molds & Smuts     Other reaction(s): Other (See Comments)   Codeine Nausea Only     Prior to Admission medications   Medication Sig Start Date End Date Taking? Authorizing Provider  BRIMONIDINE TARTRATE EX Apply topically.    [provider]  Calcium Carbonate-Vitamin D (CALCIUM PLUS VITAMIN D PO) Take 1 tablet by mouth daily.    [provider]  Coenzyme Q10 (CO Q 10) 100 MG CAPS Take 1 tablet by mouth at bedtime.     [provider]  methimazole (TAPAZOLE) 5 MG tablet Take 5 mg by mouth every other day. 01/11/21   [provider]  mirtazapine (REMERON) 7.5 MG tablet TAKE 1 TABLET BY MOUTH AT BEDTIME. 01/11/22   Virgie Dad, MD  Omega-3 Fatty Acids (FISH OIL) 1200 MG CAPS Take 1 capsule by mouth daily.     [provider]  omeprazole (PRILOSEC) 40  MG capsule TAKE 1 CAPSULE (40 MG TOTAL) BY MOUTH DAILY. 03/30/22   Virgie Dad, MD  rivastigmine (EXELON) 1.5 MG capsule TAKE 1 CAPSULE EACH MORNING. 01/01/22   Virgie Dad, MD  rivastigmine (EXELON) 3 MG capsule TAKE 1 CAPSULE (3 MG TOTAL) BY MOUTH AT BEDTIME. 11/07/21   Melvenia Beam, MD  rosuvastatin (CRESTOR) 10 MG tablet TAKE 1 TABLET BY MOUTH EVERY DAY 12/06/21   Jerline Pain, MD    ___________________________________________________________________________________________________ Physical Exam:    04/02/2022    4:09 PM 04/02/2022    4:06 PM 04/02/2022    4:00 PM  Vitals with BMI  Height  5' 3.5"   Weight  105 lbs   BMI  0000000   Systolic 123456  123456  Diastolic 71  71  Pulse 81  81     1. General:  in No  Acute distress    Chronically ill   -appearing 2. Psychological: Alert and   Oriented 3. Head/ENT: Dry Mucous Membranes  Head Non traumatic, neck supple                          Poor Dentition 4. SKIN:  decreased Skin turgor,  Skin clean Dry and intact no rash 5. Heart: Regular rate and rhythm systolic  Murmur, no Rub or gallop 6. Lungs: , no wheezes or crackles   7. Abdomen: Soft,  non-tender, Non distended  bowel sounds present 8. Lower extremities: no clubbing, cyanosis, no  edema 9. Neurologically Grossly intact, moving all 4 extremities equally   10. MSK: Normal range of motion limited in left leg    Chart has been reviewed  ______________________________________________________________________________________________  Assessment/Plan  88 y.o. female with medical history significant of ascending aortic aneurysm 5.7 cm conservative management history nonobstructive CAD aortic regurgitation left bundle branch block atrial tachycardia GERD glaucoma goiter migraines HLD neurology restless leg syndrome and temporal arteritis   Admitted for   Closed fracture of left hip, initial encounter [facility])      Present on Admission:  Pure  hypercholesterolemia  Closed left hip fracture ([place])  Dementia ([place])  Aortic aneurysm ([place])  Acute respiratory failure with hypoxemia ([place])     Pure hypercholesterolemia .Continue crestor 10 mg po q day  Dementia ([place]) Monitor for any sundowning while hospitalized  Aortic aneurysm ([place]) History of ascending aortic aneurysm. Repeat echogram. Probably would benefit from cardiology clearance prior to proceeding.   Acute respiratory failure with hypoxemia (HCC) In the setting of narcotic administration for pain relief.  Patient started 2 L.  Obtain chest x-ray  Closed left hip fracture ([place])  - management as per orthopedics,  plan to operate   in  a.m.     Keep nothing by mouth post midnight. Patient   not on anticoagulation or antiplatelet agents    Ordered type and screen, Place Foley, order a vitamin D level  Patient at baseline  able to walk a flight of stairs due to knee pain but able to walk over 100 feet   Patient denies any chest pain or shortness of breath currently and/or with exertion,  ECG showing no evidence of acute ischemia but LBBB chronic   known history of coronary artery disease and aortic aneurysm   Given advanced age patient is at least moderate  risk  which has been discussed with family   at this point   furthther cardiac workup is indicated.   will order echo one  CE     Cardiology consult for further pre-op clearance given extensive cardiac disease and aortic aneurysm    Other plan as per orders.  DVT prophylaxis:  SCD         Code Status:   DNR/DNI   as per patient   I had personally discussed CODE STATUS with patient and family* I had spent *min discussing goals of care and CODE STATUS    Family Communication:   Family  at  Bedside  plan of care was discussed  with   Son, Daughter,    Disposition Plan:     likely will need placement for rehabilitation                           Following barriers for discharge:  Will need consultants to evaluate patient prior to discharge                      Transition of care consulted        Consults called: orthopedics are aware  emailed cardiology consult for cardiac clearance  Admission status:  ED Disposition     ED Disposition  Admit   Condition  --   [place]: [place] [[id number]]  Level of Care: Telemetry [5]  Admit to tele based on following criteria: Other see comments  Comments: hx of cad and achycardai  May admit patient to [facility] or [facility] if equivalent level of care is available:: No  Covid Evaluation: Asymptomatic - no recent exposure (last 10 days) testing not required  Diagnosis: Closed left hip fracture [facility]) [id number]  Admitting Physician: Toy Baker [id number]]  Attending Physician: Toy Baker [id number]  Certification:: I certify this patient will need inpatient services for at least 2 midnights  Estimated Length of Stay: 2          inpatient     I Expect 2 midnight stay secondary to severity of patient's current illness need for inpatient interventions justified by the following:    Severe lab/radiological/exam abnormalities including:    Left hip frx and extensive comorbidities including:   CAD   That are currently affecting medical management.   I expect  patient to be hospitalized for 2 midnights requiring inpatient medical care.  Patient is at high risk for adverse outcome (such as loss of life or disability) if not treated.  Indication for inpatient stay as follows:  Severe change from baseline regarding mental status   Need for operative/procedural  intervention New or worsening hypoxia   Need for , IV fluids  IV pain medications,    Level of care     tele  For  24H       Lab Results  Component Value Date   [place] NEGATIVE 11/05/2021     Keera Altidor 04/02/2022, 7:52 PM    [facility]     after 2 AM  please page floor coverage PA If 7AM-7PM, please contact the day team taking care of the patient using [email or web address]   Patient was evaluated in the context of the global COVID-19 pandemic, which necessitated consideration that the patient might be at risk for infection with the SARS-CoV-2 virus that causes COVID-19. Institutional protocols and algorithms that pertain to the evaluation of patients at risk for COVID-19 are in a state of rapid change based on information released by regulatory bodies including the CDC and federal and state organizations. These policies and algorithms were followed during the patient's care.

## 2022-04-02 NOTE — Subjective & Objective (Signed)
Slid down the chair and injured her left hip, she hit her head as well , not on anticoagulation  While in ER pt briefly became hypoxic down to 73% on room air once patient started to brief normally again O2 up to 88% she was started 2 L this was in association with narcotic medication for pain patient was found to have left hip fracture and seen in consult by orthopedics

## 2022-04-02 NOTE — ED Triage Notes (Signed)
Patient brought in by EMS from home after slipping from chair injuring her left hip. Per EMS she is currently non-weight bearing but can straighten L leg. She was given 179mg of Fentanyl with mild relief. She has a 20g in L forearm.  158/80 87 95% CBG: 115 Pain 4/10

## 2022-04-02 NOTE — ED Notes (Signed)
Patient O2 drooped to 73% on RA, RN encouraged cough and deep breathing O2 increased to 88% RN placed patient on 2L via [place].

## 2022-04-02 NOTE — Consult Note (Signed)
      Patient reports left hip pain after a fall at home out of a chair. She was on the ground for some time before help arrived. Review of chart shows she lives at [facility]   Imaging shows left femoral neck fracture with displacement, foreshortening and angulation. Underlying osteopenia. Concentric joint space loss of both hips. Degenerative changes.  Will plan for surgery tomorrow 04/03/22 with either a left hip hemiarthroplasty or left THA depending on mobility prior to injury and co-morbidities. Keep NPO at midnight.   Formal consult to follow.    Britt Bottom, PA-C Office [phone] 04/02/2022, 5:59 PM

## 2022-04-02 NOTE — Assessment & Plan Note (Signed)
History of ascending aortic aneurysm. Repeat echogram. Probably would benefit from cardiology clearance prior to proceeding.

## 2022-04-02 NOTE — Assessment & Plan Note (Signed)
In the setting of narcotic administration for pain relief.  Patient started 2 L.  Obtain chest x-ray

## 2022-04-02 NOTE — Assessment & Plan Note (Signed)
.  Continue crestor 10 mg po q day

## 2022-04-02 NOTE — H&P (View-Only) (Signed)
ORTHOPAEDIC CONSULTATION  REQUESTING PHYSICIAN: Godfrey Pick, MD  Chief Complaint: left hip pain  HPI: Janet Wilson is a 88 y.o. female who complains of left hip pain after a fall out of a chair at home. She had immediate pain and inability to weight bear on the left leg. She was on the ground for a while before help arrived.   Imaging shows a left femoral neck fracture with displacement, foreshortening and angulation. Underlying osteopenia. Concentric joint space loss of both hips. Degenerative changes. .    Orthopedics was consulted for evaluation.    No history of MI, CVA, DVT, PE.  Previously ambulatory without the use of assistive devices. Does have trouble ambulating due to knee pain. The patient is living at [facility] with her husband.    Past Medical History:  Diagnosis Date   Aortic aneurysm (HCC)    Arthritis    OA BOTH KNEES AND HANDS   Atrial tachycardia    HX OF   GERD (gastroesophageal reflux disease)    Glaucoma    left eye   Goiter    CAUSING COUGH, HOARSINESS AND DRY THROAT   Headache(784.0)    MIGRAINES    Hyperlipidemia    Neuralgia    Overactive bladder    RLS (restless legs syndrome)    Temporal arteritis (HCC)    Past Surgical History:  Procedure Laterality Date   ARTERY BIOPSY Right 07/21/2012   Procedure: BIOPSY TEMPORAL ARTERY;  Surgeon: Haywood Lasso, MD;  Location: [facility];  Service: General;  Laterality: Right;   CERVICAL FUSION  2012   Dr. Vertell Limber   EYE SURGERY     CATARACT EXTRACTION- BILATERAL   INCONTINENCE SURGERY     JOINT REPLACEMENT     right knee   LEFT SHOULDER SURGERY     RIGHT KNEE ARTHROSCOPY  11/2009     ROBOTIC ASSISTED BILATERAL SALPINGO OOPHERECTOMY Bilateral 12/08/2013   Procedure: ROBOTIC ASSISTED LAPAROSCOPIC BILATERAL SALPINGO OOPHORECTOMY WITH STAGING;  Surgeon: Everitt Amber, MD;  Location: [facility];  Service: Gynecology;  Laterality: Bilateral;   TOTAL KNEE ARTHROPLASTY  07/04/2011   Procedure:  TOTAL KNEE ARTHROPLASTY;  Surgeon: Gearlean Alf, MD;  Location: [facility];  Service: Orthopedics;  Laterality: Right;   Social History   Socioeconomic History   Marital status: Married    Spouse name: [name]   Number of children: 2   Years of education: college   Highest education level: Not on file  Occupational History   Occupation: retired    Fish farm manager: RETIRED  Tobacco Use   Smoking status: Never   Smokeless tobacco: Never  Vaping Use   Vaping Use: Never used  Substance and Sexual Activity   Alcohol use: Yes    Alcohol/week: 1.0 standard drink of alcohol    Types: 1 Glasses of wine per week    Comment: SELDOM, very little   Drug use: No   Sexual activity: Yes  Other Topics Concern   Not on file  Social History Narrative   Pt lives at home with family.   Caffeine Use: Very little.    Patient is right handed       Social History      Diet? No       Do you drink/eat things with caffeine? Some coffee      Marital status?    Married  What year were you married? 1961      Do you live in a house, apartment, assisted living, condo, trailer, etc.?  [facility]       Is it one or more stories? one      How many persons live in your home? two      Do you have any pets in your home? No       Highest level of education completed? 14 years       Current or past profession:  Chemical engineer       Do you exercise?              Yes                        Type & how often? Water aerobic and balance class      Advanced Directives      Do you have a living will?  yes      Do you have a DNR form?        yes                          If not, do you want to discuss one?      Do you have signed POA/HPOA for forms? yes      Functional Status      Do you have difficulty bathing or dressing yourself?      Do you have difficulty preparing food or eating?       Do you have difficulty managing your medications?      Do you have difficulty managing  your finances?      Do you have difficulty affording your medications?   Social Determinants of Health   Financial Resource Strain: Not on file  Food Insecurity: Not on file  Transportation Needs: Not on file  Physical Activity: Not on file  Stress: Not on file  Social Connections: Not on file   Family History  Problem Relation Age of Onset   Alzheimer's disease Mother    Heart attack Father    Allergies  Allergen Reactions   Lisinopril Cough    Cough and very low BP , very tired   Oxycodone Anaphylaxis   Mold Extract [Trichophyton]     headache migraine   Molds & Smuts     Other reaction(s): Other (See Comments)   Codeine Nausea Only   Prior to Admission medications   Medication Sig Start Date End Date Taking? Authorizing Provider  BRIMONIDINE TARTRATE EX Apply topically.    [provider]  Calcium Carbonate-Vitamin D (CALCIUM PLUS VITAMIN D PO) Take 1 tablet by mouth daily.    [provider]  Coenzyme Q10 (CO Q 10) 100 MG CAPS Take 1 tablet by mouth at bedtime.     [provider]  methimazole (TAPAZOLE) 5 MG tablet Take 5 mg by mouth every other day. 01/11/21   [provider]  mirtazapine (REMERON) 7.5 MG tablet TAKE 1 TABLET BY MOUTH AT BEDTIME. 01/11/22   Virgie Dad, MD  Omega-3 Fatty Acids (FISH OIL) 1200 MG CAPS Take 1 capsule by mouth daily.     [provider]  omeprazole (PRILOSEC) 40 MG capsule TAKE 1 CAPSULE (40 MG TOTAL) BY MOUTH DAILY. 03/30/22   Virgie Dad, MD  rivastigmine (EXELON) 1.5 MG capsule TAKE 1 CAPSULE EACH MORNING. 01/01/22   Virgie Dad, MD  rivastigmine (EXELON)  3 MG capsule TAKE 1 CAPSULE (3 MG TOTAL) BY MOUTH AT BEDTIME. 11/07/21   Melvenia Beam, MD  rosuvastatin (CRESTOR) 10 MG tablet TAKE 1 TABLET BY MOUTH EVERY DAY 12/06/21   Jerline Pain, MD   DG Hip Unilat With Pelvis 2-3 Views Left  Result Date: 04/02/2022 CLINICAL DATA:  Pain after fall EXAM: DG HIP (WITH OR WITHOUT  PELVIS) 3V LEFT COMPARISON:  None Available. FINDINGS: Portable x-rays. Left femoral neck fracture with displacement, foreshortening and angulation. Underlying osteopenia. Concentric joint space loss of both hips. Degenerative changes. Foley catheter. IMPRESSION: Displaced subcapital femoral neck fracture. Displaced, impacted femoral neck fracture on the left Electronically Signed   By: Jill Side M.D.   On: 04/02/2022 17:23    Positive ROS: All other systems have been reviewed and were otherwise negative with the exception of those mentioned in the HPI and as above.  Objective: Labs cbc Recent Labs    04/02/22 1650  WBC 5.5  HGB 12.2  HCT 38.9  PLT 116*    Labs inflam No results for input(s): "CRP" in the last 72 hours.  Invalid input(s): "ESR"  Labs coag Recent Labs    04/02/22 1650  INR 1.1    Recent Labs    04/02/22 1650  NA 144  K 3.7  CL 110  CO2 26  GLUCOSE 86  BUN 25*  CREATININE 0.60  CALCIUM 8.5*    Physical Exam: Vitals:   04/02/22 1600 04/02/22 1609  BP: (!) 154/71 (!) 154/71  Pulse: 81 81  Resp: 16 16  Temp: 98.2 F (36.8 C) 98.2 F (36.8 C)  SpO2: 97% 97%   General: Alert, no acute distress.  Sitting up in bed, calm, comfortable Mental status: Alert and Oriented x3 Neurologic: Speech Clear and organized, no gross focal findings or movement disorder appreciated. Respiratory: No cyanosis, no use of accessory musculature Cardiovascular: No pedal edema GI: Abdomen is soft and non-tender, non-distended. Skin: Warm and dry.  No lesions in the area of chief complaint. Extremities: Warm and well perfused w/o edema Psychiatric: Patient is competent for consent with normal mood and affect  MUSCULOSKELETAL:  TTP left hip and thigh, minimal hip ROM d/t pain, compartments soft, NVI Other extremities are atraumatic with painless ROM and NVI.  Assessment / Plan: Active Problems:   * No active hospital problems. *    Plan to take patient to the  OR tomorrow 04/03/22 for left hip hemi arthroplasty vs. THA. Keep NPO at midnight.    Weightbearing: NWB LLE Orthopedic device(s): None Showering: hold VTE prophylaxis:  per primary   Pain control: per primary, limit narcotics as able due to pre-existing cognitive issues Follow - up plan:  TBD Contact information:  Edmonia Lynch MD, [facility] PA-C  Britt Bottom PA-C Office [phone] 04/02/2022 6:03 PM

## 2022-04-02 NOTE — Consult Note (Signed)
ORTHOPAEDIC CONSULTATION  REQUESTING PHYSICIAN: Godfrey Pick, MD  Chief Complaint: left hip pain  HPI: Janet Wilson is a 88 y.o. female who complains of left hip pain after a fall out of a chair at home. She had immediate pain and inability to weight bear on the left leg. She was on the ground for a while before help arrived.   Imaging shows a left femoral neck fracture with displacement, foreshortening and angulation. Underlying osteopenia. Concentric joint space loss of both hips. Degenerative changes. .    Orthopedics was consulted for evaluation.    No history of MI, CVA, DVT, PE.  Previously ambulatory without the use of assistive devices. Does have trouble ambulating due to knee pain. The patient is living at [facility] with her husband.    Past Medical History:  Diagnosis Date   Aortic aneurysm (HCC)    Arthritis    OA BOTH KNEES AND HANDS   Atrial tachycardia    HX OF   GERD (gastroesophageal reflux disease)    Glaucoma    left eye   Goiter    CAUSING COUGH, HOARSINESS AND DRY THROAT   Headache(784.0)    MIGRAINES    Hyperlipidemia    Neuralgia    Overactive bladder    RLS (restless legs syndrome)    Temporal arteritis (HCC)    Past Surgical History:  Procedure Laterality Date   ARTERY BIOPSY Right 07/21/2012   Procedure: BIOPSY TEMPORAL ARTERY;  Surgeon: Haywood Lasso, MD;  Location: [facility];  Service: General;  Laterality: Right;   CERVICAL FUSION  2012   Dr. Vertell Limber   EYE SURGERY     CATARACT EXTRACTION- BILATERAL   INCONTINENCE SURGERY     JOINT REPLACEMENT     right knee   LEFT SHOULDER SURGERY     RIGHT KNEE ARTHROSCOPY  11/2009     ROBOTIC ASSISTED BILATERAL SALPINGO OOPHERECTOMY Bilateral 12/08/2013   Procedure: ROBOTIC ASSISTED LAPAROSCOPIC BILATERAL SALPINGO OOPHORECTOMY WITH STAGING;  Surgeon: Everitt Amber, MD;  Location: [facility];  Service: Gynecology;  Laterality: Bilateral;   TOTAL KNEE ARTHROPLASTY  07/04/2011   Procedure:  TOTAL KNEE ARTHROPLASTY;  Surgeon: Gearlean Alf, MD;  Location: [facility];  Service: Orthopedics;  Laterality: Right;   Social History   Socioeconomic History   Marital status: Married    Spouse name: [name]   Number of children: 2   Years of education: college   Highest education level: Not on file  Occupational History   Occupation: retired    Fish farm manager: RETIRED  Tobacco Use   Smoking status: Never   Smokeless tobacco: Never  Vaping Use   Vaping Use: Never used  Substance and Sexual Activity   Alcohol use: Yes    Alcohol/week: 1.0 standard drink of alcohol    Types: 1 Glasses of wine per week    Comment: SELDOM, very little   Drug use: No   Sexual activity: Yes  Other Topics Concern   Not on file  Social History Narrative   Pt lives at home with family.   Caffeine Use: Very little.    Patient is right handed       Social History      Diet? No       Do you drink/eat things with caffeine? Some coffee      Marital status?    Married  What year were you married? 1961      Do you live in a house, apartment, assisted living, condo, trailer, etc.?  [facility]       Is it one or more stories? one      How many persons live in your home? two      Do you have any pets in your home? No       Highest level of education completed? 14 years       Current or past profession:  Chemical engineer       Do you exercise?              Yes                        Type & how often? Water aerobic and balance class      Advanced Directives      Do you have a living will?  yes      Do you have a DNR form?        yes                          If not, do you want to discuss one?      Do you have signed POA/HPOA for forms? yes      Functional Status      Do you have difficulty bathing or dressing yourself?      Do you have difficulty preparing food or eating?       Do you have difficulty managing your medications?      Do you have difficulty managing  your finances?      Do you have difficulty affording your medications?   Social Determinants of Health   Financial Resource Strain: Not on file  Food Insecurity: Not on file  Transportation Needs: Not on file  Physical Activity: Not on file  Stress: Not on file  Social Connections: Not on file   Family History  Problem Relation Age of Onset   Alzheimer's disease Mother    Heart attack Father    Allergies  Allergen Reactions   Lisinopril Cough    Cough and very low BP , very tired   Oxycodone Anaphylaxis   Mold Extract [Trichophyton]     headache migraine   Molds & Smuts     Other reaction(s): Other (See Comments)   Codeine Nausea Only   Prior to Admission medications   Medication Sig Start Date End Date Taking? Authorizing Provider  BRIMONIDINE TARTRATE EX Apply topically.    [provider]  Calcium Carbonate-Vitamin D (CALCIUM PLUS VITAMIN D PO) Take 1 tablet by mouth daily.    [provider]  Coenzyme Q10 (CO Q 10) 100 MG CAPS Take 1 tablet by mouth at bedtime.     [provider]  methimazole (TAPAZOLE) 5 MG tablet Take 5 mg by mouth every other day. 01/11/21   [provider]  mirtazapine (REMERON) 7.5 MG tablet TAKE 1 TABLET BY MOUTH AT BEDTIME. 01/11/22   Virgie Dad, MD  Omega-3 Fatty Acids (FISH OIL) 1200 MG CAPS Take 1 capsule by mouth daily.     [provider]  omeprazole (PRILOSEC) 40 MG capsule TAKE 1 CAPSULE (40 MG TOTAL) BY MOUTH DAILY. 03/30/22   Virgie Dad, MD  rivastigmine (EXELON) 1.5 MG capsule TAKE 1 CAPSULE EACH MORNING. 01/01/22   Virgie Dad, MD  rivastigmine (EXELON)  3 MG capsule TAKE 1 CAPSULE (3 MG TOTAL) BY MOUTH AT BEDTIME. 11/07/21   Melvenia Beam, MD  rosuvastatin (CRESTOR) 10 MG tablet TAKE 1 TABLET BY MOUTH EVERY DAY 12/06/21   Jerline Pain, MD   DG Hip Unilat With Pelvis 2-3 Views Left  Result Date: 04/02/2022 CLINICAL DATA:  Pain after fall EXAM: DG HIP (WITH OR WITHOUT  PELVIS) 3V LEFT COMPARISON:  None Available. FINDINGS: Portable x-rays. Left femoral neck fracture with displacement, foreshortening and angulation. Underlying osteopenia. Concentric joint space loss of both hips. Degenerative changes. Foley catheter. IMPRESSION: Displaced subcapital femoral neck fracture. Displaced, impacted femoral neck fracture on the left Electronically Signed   By: Jill Side M.D.   On: 04/02/2022 17:23    Positive ROS: All other systems have been reviewed and were otherwise negative with the exception of those mentioned in the HPI and as above.  Objective: Labs cbc Recent Labs    04/02/22 1650  WBC 5.5  HGB 12.2  HCT 38.9  PLT 116*    Labs inflam No results for input(s): "CRP" in the last 72 hours.  Invalid input(s): "ESR"  Labs coag Recent Labs    04/02/22 1650  INR 1.1    Recent Labs    04/02/22 1650  NA 144  K 3.7  CL 110  CO2 26  GLUCOSE 86  BUN 25*  CREATININE 0.60  CALCIUM 8.5*    Physical Exam: Vitals:   04/02/22 1600 04/02/22 1609  BP: (!) 154/71 (!) 154/71  Pulse: 81 81  Resp: 16 16  Temp: 98.2 F (36.8 C) 98.2 F (36.8 C)  SpO2: 97% 97%   General: Alert, no acute distress.  Sitting up in bed, calm, comfortable Mental status: Alert and Oriented x3 Neurologic: Speech Clear and organized, no gross focal findings or movement disorder appreciated. Respiratory: No cyanosis, no use of accessory musculature Cardiovascular: No pedal edema GI: Abdomen is soft and non-tender, non-distended. Skin: Warm and dry.  No lesions in the area of chief complaint. Extremities: Warm and well perfused w/o edema Psychiatric: Patient is competent for consent with normal mood and affect  MUSCULOSKELETAL:  TTP left hip and thigh, minimal hip ROM d/t pain, compartments soft, NVI Other extremities are atraumatic with painless ROM and NVI.  Assessment / Plan: Active Problems:   * No active hospital problems. *    Plan to take patient to the  OR tomorrow 04/03/22 for left hip hemi arthroplasty vs. THA. Keep NPO at midnight.    Weightbearing: NWB LLE Orthopedic device(s): None Showering: hold VTE prophylaxis:  per primary   Pain control: per primary, limit narcotics as able due to pre-existing cognitive issues Follow - up plan:  TBD Contact information:  Edmonia Lynch MD, [facility] PA-C  Britt Bottom PA-C Office [phone] 04/02/2022 6:03 PM

## 2022-04-02 NOTE — Assessment & Plan Note (Signed)
-   management as per orthopedics,  plan to operate   in  a.m.     Keep nothing by mouth post midnight. Patient   not on anticoagulation or antiplatelet agents    Ordered type and screen, Place Foley, order a vitamin D level  Patient at baseline  able to walk a flight of stairs due to knee pain but able to walk over 100 feet   Patient denies any chest pain or shortness of breath currently and/or with exertion,  ECG showing no evidence of acute ischemia but LBBB chronic   known history of coronary artery disease and aortic aneurysm   Given advanced age patient is at least moderate  risk  which has been discussed with family   at this point   furthther cardiac workup is indicated.   will order echo one  CE     Cardiology consult for further pre-op clearance given extensive cardiac disease and aortic aneurysm

## 2022-04-02 NOTE — ED Notes (Signed)
ED TO INPATIENT HANDOFF REPORT  ED Nurse Name and Phone #: Tonette Bihari [phone]  S Name/Age/Gender Janet Wilson 88 y.o. female Room/Bed: WA25/WA25  Code Status   Code Status: DNR  Home/SNF/Other Home Patient oriented to: self, place, time, and situation Is this baseline? Yes   Triage Complete: Triage complete  Chief Complaint Closed left hip fracture [facility]) [S72.002A]  Triage Note Patient brought in by EMS from home after slipping from chair injuring her left hip. Per EMS she is currently non-weight bearing but can straighten L leg. She was given 159mg of Fentanyl with mild relief. She has a 20g in L forearm.  158/80 87 95% CBG: 115 Pain 4/10   Allergies Allergies  Allergen Reactions   Lisinopril Other (See Comments) and Cough    Very low BP/"very tired" also   Oxycodone Anaphylaxis   Mold Extract [Trichophyton] Other (See Comments)    Headaches and migraines   Molds & Smuts Other (See Comments)    Headaches and migraines   Codeine Nausea Only    Level of Care/Admitting Diagnosis ED Disposition     ED Disposition  Admit   Condition  --   Comment  [id number]]  Level of Care: Telemetry [5]  Admit to tele based on following criteria: Other see comments  Comments: hx of cad and achycardai  [facility]  Covid Evaluation: Asymptomatic - no recent exposure (last 10 days) testing not required  Diagnosis: Closed left hip fracture ([facility] [[id number] Admitting Physician: DToy Baker[[id number]]  Attending Physician: DToy Baker[[id number] Certification:: I certify this patient will need inpatient services for at least 2 midnights  Estimated Length of Stay: 2          B Medical/Surgery History Past Medical History:  Diagnosis Date   Aortic aneurysm ([facility]    Arthritis    OA BOTH KNEES AND HANDS   Atrial tachycardia     HX OF   GERD (gastroesophageal reflux disease)    Glaucoma    left eye   Goiter    CAUSING COUGH, HOARSINESS AND DRY THROAT   Headache(784.0)    MIGRAINES    Hyperlipidemia    Neuralgia    Overactive bladder    RLS (restless legs syndrome)    Temporal arteritis ([facility]    Past Surgical History:  Procedure Laterality Date   ARTERY BIOPSY Right 07/21/2012   Procedure: BIOPSY TEMPORAL ARTERY;  Surgeon: CHaywood Lasso MD;  [facility]  Service: General;  Laterality: Right;   CERVICAL FUSION  2012   Dr. SVertell Limber  EYE SURGERY     CATARACT EXTRACTION- BILATERAL   INCONTINENCE SURGERY     JOINT REPLACEMENT     right knee   LEFT SHOULDER SURGERY     RIGHT KNEE ARTHROSCOPY  11/2009     ROBOTIC ASSISTED BILATERAL SALPINGO OOPHERECTOMY Bilateral 12/08/2013   Procedure: ROBOTIC ASSISTED LAPAROSCOPIC BILATERAL SALPINGO OOPHORECTOMY WITH STAGING;  Surgeon: EEveritt Amber MD;  Location: [facility];  Service: Gynecology;  Laterality: Bilateral;   TOTAL KNEE ARTHROPLASTY  07/04/2011   Procedure: TOTAL KNEE ARTHROPLASTY;  Surgeon: FGearlean Alf MD;  Location: [facility];  Service: Orthopedics;  Laterality: Right;     A IV Location/Drains/Wounds Patient Lines/Drains/Airways Status     Active Line/Drains/Airways     Name Placement date Placement time Site Days   Peripheral IV 04/02/22 20  G 1" Left;Posterior Forearm 04/02/22  1525  Forearm  less than 1   Incision - 4 Ports Abdomen 1: Right;Medial 2: Umbilicus 3: Left;Upper 4: Left;Lower 12/08/13  1152  -- 3037            Intake/Output Last 24 hours No intake or output data in the 24 hours ending 04/02/22 2006  Labs/Imaging Results for orders placed or performed during the hospital encounter of 04/02/22 (from the past 48 hour(s))  Basic metabolic panel     Status: Abnormal   Collection Time: 04/02/22  4:50 PM  Result Value Ref Range   Sodium 144 135 - 145 mmol/L   Potassium 3.7 3.5 - 5.1 mmol/L   Chloride 110 98 - 111 mmol/L   CO2 26  22 - 32 mmol/L   Glucose, Bld 86 70 - 99 mg/dL    Comment: Glucose reference range applies only to samples taken after fasting for at least 8 hours.   BUN 25 (H) 8 - 23 mg/dL   Creatinine, Ser 0.60 0.44 - 1.00 mg/dL   Calcium 8.5 (L) 8.9 - 10.3 mg/dL   GFR, Estimated >60 >60 mL/min    Comment: (NOTE) Calculated using the CKD-EPI Creatinine Equation (2021)    Anion gap 8 5 - 15    Comment: Performed at [facility], [place] [address] [place], [place] [address]  CBC with Differential     Status: Abnormal   Collection Time: 04/02/22  4:50 PM  Result Value Ref Range   WBC 5.5 4.0 - 10.5 K/uL   RBC 3.98 3.87 - 5.11 MIL/uL   Hemoglobin 12.2 12.0 - 15.0 g/dL   HCT 38.9 36.0 - 46.0 %   MCV 97.7 80.0 - 100.0 fL   MCH 30.7 26.0 - 34.0 pg   MCHC 31.4 30.0 - 36.0 g/dL   RDW 13.2 11.5 - 15.5 %   Platelets 116 (L) 150 - 400 K/uL   nRBC 0.0 0.0 - 0.2 %   Neutrophils Relative % 75 %   Neutro Abs 4.1 1.7 - 7.7 K/uL   Lymphocytes Relative 17 %   Lymphs Abs 1.0 0.7 - 4.0 K/uL   Monocytes Relative 6 %   Monocytes Absolute 0.4 0.1 - 1.0 K/uL   Eosinophils Relative 1 %   Eosinophils Absolute 0.1 0.0 - 0.5 K/uL   Basophils Relative 0 %   Basophils Absolute 0.0 0.0 - 0.1 K/uL   Immature Granulocytes 1 %   Abs Immature Granulocytes 0.03 0.00 - 0.07 K/uL    Comment: Performed at [facility], [place] [address] [place], [place] [address]  Protime-INR     Status: None   Collection Time: 04/02/22  4:50 PM  Result Value Ref Range   Prothrombin Time 13.9 11.4 - 15.2 seconds   INR 1.1 0.8 - 1.2    Comment: (NOTE) INR goal varies based on device and disease states. Performed at [facility], [place] [address], [place], [place] [address]   Type and screen Ordered by PROVIDER DEFAULT     Status: None (Preliminary result)   Collection Time: 04/02/22  7:43 PM  Result Value Ref Range   ABO/RH(D) PENDING    Antibody Screen PENDING    Sample Expiration       04/05/2022,2359 Performed at [facility], [place] [address], [place], [place] [address]    CT CERVICAL SPINE WO CONTRAST  Result Date: 04/02/2022 CLINICAL DATA:  [place] from chair EXAM: CT CERVICAL SPINE  WITHOUT CONTRAST TECHNIQUE: Multidetector CT imaging of the cervical spine was performed without intravenous contrast. Multiplanar CT image reconstructions were also generated. RADIATION DOSE REDUCTION: This exam was performed according to the departmental dose-optimization program which includes automated exposure control, adjustment of the mA and/or kV according to patient size and/or use of iterative reconstruction technique. COMPARISON:  02/23/2021 FINDINGS: Alignment: Stable mild degenerative anterolisthesis of C7 on T1. Otherwise alignment is grossly anatomic. Skull base and vertebrae: No acute fracture. No primary bone lesion or focal pathologic process. Soft tissues and spinal canal: No prevertebral fluid or swelling. No visible canal hematoma. Disc levels: Prior ACDF spanning C4 through C7. There is mild diffuse spondylosis and facet hypertrophy throughout the nonsurgical levels. No change since prior exam. Upper chest: Airway is patent. Lung apices are clear. Known thyroid goiter, previously evaluated by ultrasound and biopsy. Other: Reconstructed images demonstrate no additional findings. IMPRESSION: 1. No acute cervical spine fracture. 2. Stable postsurgical and degenerative changes of the cervical spine. Electronically Signed   By: Randa Ngo M.D.   On: 04/02/2022 19:28   CT HEAD WO CONTRAST  Result Date: 04/02/2022 CLINICAL DATA:  [place] from chair EXAM: CT HEAD WITHOUT CONTRAST TECHNIQUE: Contiguous axial images were obtained from the base of the skull through the vertex without intravenous contrast. RADIATION DOSE REDUCTION: This exam was performed according to the departmental dose-optimization program which includes automated exposure control, adjustment of the mA  and/or kV according to patient size and/or use of iterative reconstruction technique. COMPARISON:  02/23/2021 FINDINGS: Brain: Chronic small vessel ischemic changes within the white matter and bilateral basal ganglia. No acute infarct or hemorrhage. Lateral ventricles and midline structures are unremarkable. No acute extra-axial fluid collections. No mass effect. Vascular: No hyperdense vessel or unexpected calcification. Skull: Normal. Negative for fracture or focal lesion. Sinuses/Orbits: Chronic left sphenoid sinus disease. The remaining paranasal sinuses are clear. Other: None. IMPRESSION: 1. No acute intracranial process. Electronically Signed   By: Randa Ngo M.D.   On: 04/02/2022 19:25   DG Chest Port 1 View  Result Date: 04/02/2022 CLINICAL DATA:  Pain, left hip fracture, preoperative evaluation EXAM: PORTABLE CHEST 1 VIEW COMPARISON:  11/05/2021, 10/04/2021 FINDINGS: Single frontal view of the chest demonstrates stable enlargement of the cardiac silhouette. Dilated ectatic thoracic aorta compatible with known ascending thoracic aortic aneurysm. Increased density in the right paratracheal region consistent with vascular shadow and thyroid. No acute airspace disease, effusion, or pneumothorax. No acute bony abnormality. IMPRESSION: 1. Continued dilation and ectasia of the thoracic aorta consistent with known thoracic aortic aneurysm. 2. No acute airspace disease. Electronically Signed   By: Randa Ngo M.D.   On: 04/02/2022 18:54   DG Hip Unilat With Pelvis 2-3 Views Left  Result Date: 04/02/2022 CLINICAL DATA:  Pain after fall EXAM: DG HIP (WITH OR WITHOUT PELVIS) 3V LEFT COMPARISON:  None Available. FINDINGS: Portable x-rays. Left femoral neck fracture with displacement, foreshortening and angulation. Underlying osteopenia. Concentric joint space loss of both hips. Degenerative changes. Foley catheter. IMPRESSION: Displaced subcapital femoral neck fracture. Displaced, impacted femoral neck  fracture on the left Electronically Signed   By: Jill Side M.D.   On: 04/02/2022 17:23    Pending Labs Unresulted Labs (From admission, onward)     Start     Ordered   04/03/22 0500  Prealbumin  Tomorrow morning,   R        04/02/22 1852   04/02/22 1853  CK  Add-on,   AD  04/02/22 1852   04/02/22 1853  Hepatic function panel  Add-on,   AD       Question:  Release to patient  Answer:  Immediate   04/02/22 1852   04/02/22 1853  Magnesium  Add-on,   AD        04/02/22 1852   04/02/22 1853  Phosphorus  Add-on,   AD        04/02/22 1852   04/02/22 1853  TSH  Add-on,   AD        04/02/22 1852   04/02/22 1853  Urinalysis, Complete w Microscopic -Urine, Clean Catch  Once,   R       Question Answer Comment  Release to patient Immediate   Specimen Source Urine, Clean Catch      04/02/22 1852   Signed and Held  VITAMIN D 25 Hydroxy (Vit-D Deficiency, Fractures)  Tomorrow morning,   R        Signed and Held            Vitals/Pain Today's Vitals   04/02/22 1600 04/02/22 1606 04/02/22 1609 04/02/22 1707  BP: (!) 154/71  (!) 154/71   Pulse: 81  81   Resp: 16  16   Temp: 98.2 F (36.8 C)  98.2 F (36.8 C)   TempSrc: Oral  Oral   SpO2: 97%  97%   Weight:  47.6 kg    Height:  5' 3.5" (1.613 m)    PainSc:  10-Worst pain ever  3     Isolation Precautions No active isolations  Medications Medications  fentaNYL (SUBLIMAZE) injection 50 mcg (50 mcg Intravenous Given 04/02/22 1642)  ondansetron (ZOFRAN) injection 4 mg (4 mg Intravenous Given 04/02/22 1642)    Mobility Walks     Focused Assessments    R Recommendations: See Admitting Provider Note  Report given to:   Additional Notes:

## 2022-04-03 ENCOUNTER — Inpatient Hospital Stay (HOSPITAL_COMMUNITY): Payer: Medicare Other

## 2022-04-03 ENCOUNTER — Encounter (HOSPITAL_COMMUNITY): Payer: Self-pay | Admitting: Internal Medicine

## 2022-04-03 ENCOUNTER — Other Ambulatory Visit: Payer: Self-pay

## 2022-04-03 ENCOUNTER — Inpatient Hospital Stay (HOSPITAL_COMMUNITY): Payer: Medicare Other | Admitting: Anesthesiology

## 2022-04-03 ENCOUNTER — Encounter (HOSPITAL_COMMUNITY)
Admission: EM | Disposition: A | Discharge status: Skilled Nursing Facility | Payer: Self-pay | Source: Home / Self Care | Attending: Internal Medicine

## 2022-04-03 DIAGNOSIS — I350 Nonrheumatic aortic (valve) stenosis: Secondary | ICD-10-CM | POA: Diagnosis not present

## 2022-04-03 DIAGNOSIS — I7121 Aneurysm of the ascending aorta, without rupture: Secondary | ICD-10-CM | POA: Diagnosis not present

## 2022-04-03 DIAGNOSIS — S72002A Fracture of unspecified part of neck of left femur, initial encounter for closed fracture: Secondary | ICD-10-CM | POA: Diagnosis not present

## 2022-04-03 DIAGNOSIS — Z01810 Encounter for preprocedural cardiovascular examination: Secondary | ICD-10-CM

## 2022-04-03 DIAGNOSIS — I351 Nonrheumatic aortic (valve) insufficiency: Secondary | ICD-10-CM | POA: Diagnosis not present

## 2022-04-03 DIAGNOSIS — I2510 Atherosclerotic heart disease of native coronary artery without angina pectoris: Secondary | ICD-10-CM

## 2022-04-03 DIAGNOSIS — F418 Other specified anxiety disorders: Secondary | ICD-10-CM | POA: Diagnosis not present

## 2022-04-03 DIAGNOSIS — D352 Benign neoplasm of pituitary gland: Secondary | ICD-10-CM | POA: Diagnosis not present

## 2022-04-03 DIAGNOSIS — Z01818 Encounter for other preprocedural examination: Secondary | ICD-10-CM | POA: Diagnosis not present

## 2022-04-03 DIAGNOSIS — S72012A Unspecified intracapsular fracture of left femur, initial encounter for closed fracture: Secondary | ICD-10-CM | POA: Diagnosis not present

## 2022-04-03 DIAGNOSIS — D696 Thrombocytopenia, unspecified: Secondary | ICD-10-CM | POA: Diagnosis not present

## 2022-04-03 DIAGNOSIS — J9601 Acute respiratory failure with hypoxia: Secondary | ICD-10-CM | POA: Diagnosis not present

## 2022-04-03 DIAGNOSIS — Z66 Do not resuscitate: Secondary | ICD-10-CM | POA: Diagnosis not present

## 2022-04-03 HISTORY — PX: TOTAL HIP ARTHROPLASTY: SHX124

## 2022-04-03 LAB — ECHOCARDIOGRAM COMPLETE
AR max vel: 3.14 cm2
AV Area VTI: 3.12 cm2
AV Area mean vel: 3.01 cm2
AV Mean grad: 7 mmHg
AV Peak grad: 12.5 mmHg
Ao pk vel: 1.77 m/s
Area-P 1/2: 5.54 cm2
Calc EF: 50.5 %
Est EF: 55
Height: 63.5 in
MV VTI: 3.21 cm2
P 1/2 time: 529 msec
S' Lateral: 2.7 cm
Single Plane A2C EF: 59.3 %
Single Plane A4C EF: 47.3 %
Weight: 1680 oz

## 2022-04-03 LAB — TYPE AND SCREEN
ABO/RH(D): O POS
ABO/RH(D): O POS
Antibody Screen: NEGATIVE
Antibody Screen: NEGATIVE

## 2022-04-03 LAB — SURGICAL PCR SCREEN
MRSA, PCR: NEGATIVE
Staphylococcus aureus: NEGATIVE

## 2022-04-03 LAB — PREALBUMIN: Prealbumin: 13 mg/dL — ABNORMAL LOW (ref 18–38)

## 2022-04-03 LAB — VITAMIN D 25 HYDROXY (VIT D DEFICIENCY, FRACTURES): Vit D, 25-Hydroxy: 46.71 ng/mL (ref 30–100)

## 2022-04-03 LAB — TSH: TSH: 0.281 u[IU]/mL — ABNORMAL LOW (ref 0.350–4.500)

## 2022-04-03 SURGERY — ARTHROPLASTY, HIP, TOTAL, ANTERIOR APPROACH
Anesthesia: Spinal | Site: Hip | Laterality: Left

## 2022-04-03 MED ORDER — FENTANYL CITRATE (PF) 100 MCG/2ML IJ SOLN
INTRAMUSCULAR | Status: AC
  Filled 2022-04-03: qty 2

## 2022-04-03 MED ORDER — DIPHENHYDRAMINE HCL 12.5 MG/5ML PO ELIX: 12.5000 mg | ORAL_SOLUTION | ORAL | Status: DC | PRN

## 2022-04-03 MED ORDER — BUPIVACAINE IN DEXTROSE 0.75-8.25 % IT SOLN
INTRATHECAL | Status: DC | PRN
  Administered 2022-04-03: 1.6 mL via INTRATHECAL

## 2022-04-03 MED ORDER — POVIDONE-IODINE 10 % EX SWAB
2.0000 | Freq: Once | CUTANEOUS | Status: AC
  Administered 2022-04-03: 2 via TOPICAL

## 2022-04-03 MED ORDER — FENTANYL CITRATE (PF) 100 MCG/2ML IJ SOLN
INTRAMUSCULAR | Status: DC | PRN
  Administered 2022-04-03: 50 ug via INTRAVENOUS

## 2022-04-03 MED ORDER — DEXAMETHASONE SODIUM PHOSPHATE 10 MG/ML IJ SOLN: 8.0000 mg | Freq: Once | INTRAMUSCULAR | Status: DC

## 2022-04-03 MED ORDER — HYDROCODONE-ACETAMINOPHEN 5-325 MG PO TABS
1.0000 | ORAL_TABLET | ORAL | Status: DC | PRN
  Administered 2022-04-04 – 2022-04-05 (×2): 2 via ORAL
  Filled 2022-04-03: qty 1
  Filled 2022-04-03 (×2): qty 2

## 2022-04-03 MED ORDER — METOCLOPRAMIDE HCL 5 MG/ML IJ SOLN: 5.0000 mg | Freq: Three times a day (TID) | INTRAMUSCULAR | Status: DC | PRN

## 2022-04-03 MED ORDER — PANTOPRAZOLE SODIUM 40 MG PO TBEC: 40.0000 mg | DELAYED_RELEASE_TABLET | Freq: Every day | ORAL | Status: DC

## 2022-04-03 MED ORDER — METHOCARBAMOL 500 MG PO TABS: 500.0000 mg | ORAL_TABLET | Freq: Four times a day (QID) | ORAL | Status: DC | PRN

## 2022-04-03 MED ORDER — BISACODYL 10 MG RE SUPP: 10.0000 mg | Freq: Every day | RECTAL | Status: DC | PRN

## 2022-04-03 MED ORDER — METOCLOPRAMIDE HCL 5 MG PO TABS: 5.0000 mg | ORAL_TABLET | Freq: Three times a day (TID) | ORAL | Status: DC | PRN

## 2022-04-03 MED ORDER — TRANEXAMIC ACID-NACL 1000-0.7 MG/100ML-% IV SOLN
1000.0000 mg | Freq: Once | INTRAVENOUS | Status: AC
  Administered 2022-04-03: 1000 mg via INTRAVENOUS
  Filled 2022-04-03: qty 100

## 2022-04-03 MED ORDER — ONDANSETRON HCL 4 MG/2ML IJ SOLN: 4.0000 mg | Freq: Four times a day (QID) | INTRAMUSCULAR | Status: DC | PRN

## 2022-04-03 MED ORDER — CHLORHEXIDINE GLUCONATE 0.12 % MT SOLN
15.0000 mL | Freq: Once | OROMUCOSAL | Status: AC
  Administered 2022-04-03: 15 mL via OROMUCOSAL

## 2022-04-03 MED ORDER — ONDANSETRON HCL 4 MG PO TABS: 4.0000 mg | ORAL_TABLET | Freq: Four times a day (QID) | ORAL | Status: DC | PRN

## 2022-04-03 MED ORDER — LACTATED RINGERS IV SOLN: INTRAVENOUS | Status: DC

## 2022-04-03 MED ORDER — SODIUM CHLORIDE FLUSH 0.9 % IV SOLN
INTRAVENOUS | Status: DC | PRN
  Administered 2022-04-03: 10 mL

## 2022-04-03 MED ORDER — ORAL CARE MOUTH RINSE: 15.0000 mL | Freq: Once | OROMUCOSAL | Status: AC

## 2022-04-03 MED ORDER — PANTOPRAZOLE SODIUM 40 MG PO TBEC
40.0000 mg | DELAYED_RELEASE_TABLET | Freq: Every day | ORAL | Status: DC
  Administered 2022-04-04 – 2022-04-05 (×2): 40 mg via ORAL
  Filled 2022-04-03 (×2): qty 1

## 2022-04-03 MED ORDER — CEFAZOLIN SODIUM-DEXTROSE 2-4 GM/100ML-% IV SOLN
2.0000 g | INTRAVENOUS | Status: AC
  Administered 2022-04-03: 2 g via INTRAVENOUS
  Filled 2022-04-03: qty 100

## 2022-04-03 MED ORDER — BUPIVACAINE LIPOSOME 1.3 % IJ SUSP
INTRAMUSCULAR | Status: DC | PRN
  Administered 2022-04-03: 10 mL

## 2022-04-03 MED ORDER — 0.9 % SODIUM CHLORIDE (POUR BTL) OPTIME
TOPICAL | Status: DC | PRN
  Administered 2022-04-03: 1000 mL

## 2022-04-03 MED ORDER — DEXAMETHASONE SODIUM PHOSPHATE 4 MG/ML IJ SOLN
INTRAMUSCULAR | Status: DC | PRN
  Administered 2022-04-03: 8 mg via INTRAVENOUS

## 2022-04-03 MED ORDER — ACETAMINOPHEN 500 MG PO TABS
1000.0000 mg | ORAL_TABLET | Freq: Once | ORAL | Status: AC
  Administered 2022-04-03: 1000 mg via ORAL
  Filled 2022-04-03: qty 2

## 2022-04-03 MED ORDER — ACETAMINOPHEN 325 MG PO TABS
325.0000 mg | ORAL_TABLET | Freq: Four times a day (QID) | ORAL | Status: DC | PRN
  Administered 2022-04-04 – 2022-04-05 (×2): 650 mg via ORAL
  Filled 2022-04-03 (×2): qty 2

## 2022-04-03 MED ORDER — MAGNESIUM CITRATE PO SOLN: 1.0000 | Freq: Once | ORAL | Status: DC | PRN

## 2022-04-03 MED ORDER — WATER FOR IRRIGATION, STERILE IR SOLN
Status: DC | PRN
  Administered 2022-04-03: 2000 mL

## 2022-04-03 MED ORDER — TRANEXAMIC ACID-NACL 1000-0.7 MG/100ML-% IV SOLN
1000.0000 mg | INTRAVENOUS | Status: AC
  Administered 2022-04-03: 1000 mg via INTRAVENOUS
  Filled 2022-04-03: qty 100

## 2022-04-03 MED ORDER — POLYETHYLENE GLYCOL 3350 17 G PO PACK: 17.0000 g | PACK | Freq: Every day | ORAL | Status: DC | PRN

## 2022-04-03 MED ORDER — RIVASTIGMINE TARTRATE 3 MG PO CAPS
3.0000 mg | ORAL_CAPSULE | Freq: Every day | ORAL | Status: DC
  Administered 2022-04-03 – 2022-04-04 (×2): 3 mg via ORAL
  Filled 2022-04-03 (×2): qty 1

## 2022-04-03 MED ORDER — ENOXAPARIN SODIUM 30 MG/0.3ML IJ SOSY
30.0000 mg | PREFILLED_SYRINGE | INTRAMUSCULAR | Status: DC
  Administered 2022-04-04 – 2022-04-05 (×2): 30 mg via SUBCUTANEOUS
  Filled 2022-04-03 (×2): qty 0.3

## 2022-04-03 MED ORDER — SUGAMMADEX SODIUM 500 MG/5ML IV SOLN
INTRAVENOUS | Status: AC
  Filled 2022-04-03: qty 5

## 2022-04-03 MED ORDER — CHLORHEXIDINE GLUCONATE 4 % EX LIQD: 60.0000 mL | Freq: Once | CUTANEOUS | Status: DC

## 2022-04-03 MED ORDER — FENTANYL CITRATE PF 50 MCG/ML IJ SOSY: 25.0000 ug | PREFILLED_SYRINGE | INTRAMUSCULAR | Status: DC | PRN

## 2022-04-03 MED ORDER — MORPHINE SULFATE (PF) 2 MG/ML IV SOLN: 0.5000 mg | INTRAVENOUS | Status: DC | PRN

## 2022-04-03 MED ORDER — CEFAZOLIN SODIUM-DEXTROSE 1-4 GM/50ML-% IV SOLN
1.0000 g | Freq: Four times a day (QID) | INTRAVENOUS | Status: AC
  Administered 2022-04-03 – 2022-04-04 (×2): 1 g via INTRAVENOUS
  Filled 2022-04-03 (×2): qty 50

## 2022-04-03 MED ORDER — PHENYLEPHRINE HCL-NACL 20-0.9 MG/250ML-% IV SOLN
INTRAVENOUS | Status: DC | PRN
  Administered 2022-04-03: 30 ug/min via INTRAVENOUS

## 2022-04-03 MED ORDER — DOCUSATE SODIUM 100 MG PO CAPS
100.0000 mg | ORAL_CAPSULE | Freq: Two times a day (BID) | ORAL | Status: DC
  Administered 2022-04-03 – 2022-04-05 (×4): 100 mg via ORAL
  Filled 2022-04-03 (×4): qty 1

## 2022-04-03 MED ORDER — SODIUM CHLORIDE (PF) 0.9 % IJ SOLN
INTRAMUSCULAR | Status: AC
  Filled 2022-04-03: qty 10

## 2022-04-03 MED ORDER — ACETAMINOPHEN 500 MG PO TABS
500.0000 mg | ORAL_TABLET | Freq: Four times a day (QID) | ORAL | Status: AC
  Administered 2022-04-03 – 2022-04-04 (×3): 500 mg via ORAL
  Filled 2022-04-03 (×3): qty 1

## 2022-04-03 MED ORDER — PROPOFOL 500 MG/50ML IV EMUL
INTRAVENOUS | Status: DC | PRN
  Administered 2022-04-03: 50 ug/kg/min via INTRAVENOUS

## 2022-04-03 MED ORDER — METHOCARBAMOL 1000 MG/10ML IJ SOLN: 500.0000 mg | Freq: Four times a day (QID) | INTRAVENOUS | Status: DC | PRN

## 2022-04-03 MED ORDER — DEXAMETHASONE SODIUM PHOSPHATE 10 MG/ML IJ SOLN
10.0000 mg | Freq: Once | INTRAMUSCULAR | Status: AC
  Administered 2022-04-04: 10 mg via INTRAVENOUS
  Filled 2022-04-03: qty 1

## 2022-04-03 MED ORDER — PHENOL 1.4 % MT LIQD: 1.0000 | OROMUCOSAL | Status: DC | PRN

## 2022-04-03 MED ORDER — HYDROCODONE-ACETAMINOPHEN 5-325 MG PO TABS: 1.0000 | ORAL_TABLET | Freq: Once | ORAL | Status: DC

## 2022-04-03 MED ORDER — MENTHOL 3 MG MT LOZG: 1.0000 | LOZENGE | OROMUCOSAL | Status: DC | PRN

## 2022-04-03 MED ORDER — ONDANSETRON HCL 4 MG/2ML IJ SOLN
INTRAMUSCULAR | Status: DC | PRN
  Administered 2022-04-03: 4 mg via INTRAVENOUS

## 2022-04-03 MED ORDER — ADULT MULTIVITAMIN W/MINERALS CH
1.0000 | ORAL_TABLET | Freq: Every day | ORAL | Status: DC
  Administered 2022-04-04 – 2022-04-05 (×2): 1 via ORAL
  Filled 2022-04-03 (×2): qty 1

## 2022-04-03 MED ORDER — HYDROCODONE-ACETAMINOPHEN 7.5-325 MG PO TABS: 1.0000 | ORAL_TABLET | ORAL | Status: DC | PRN

## 2022-04-03 MED ORDER — BUPIVACAINE LIPOSOME 1.3 % IJ SUSP
INTRAMUSCULAR | Status: AC
  Filled 2022-04-03: qty 10

## 2022-04-03 MED ORDER — RIVASTIGMINE TARTRATE 1.5 MG PO CAPS
1.5000 mg | ORAL_CAPSULE | Freq: Every day | ORAL | Status: DC
  Administered 2022-04-04 – 2022-04-05 (×2): 1.5 mg via ORAL
  Filled 2022-04-03 (×2): qty 1

## 2022-04-03 MED ORDER — ALUM & MAG HYDROXIDE-SIMETH 200-200-20 MG/5ML PO SUSP: 30.0000 mL | ORAL | Status: DC | PRN

## 2022-04-03 SURGICAL SUPPLY — 48 items
APL PRP STRL LF DISP 70% ISPRP (MISCELLANEOUS) ×1
BAG COUNTER SPONGE SURGICOUNT (BAG) IMPLANT
BAG SPEC THK2 15X12 ZIP CLS (MISCELLANEOUS)
BAG SPNG CNTER NS LX DISP (BAG)
BAG ZIPLOCK 12X15 (MISCELLANEOUS) IMPLANT
BLADE SAG 18X100X1.27 (BLADE) ×2 IMPLANT
BLADE SURG SZ10 CARB STEEL (BLADE) ×2 IMPLANT
CHLORAPREP W/TINT 26 (MISCELLANEOUS) ×2 IMPLANT
CLSR STERI-STRIP ANTIMIC 1/2X4 (GAUZE/BANDAGES/DRESSINGS) ×2 IMPLANT
COVER PERINEAL POST (MISCELLANEOUS) ×2 IMPLANT
COVER SURGICAL LIGHT HANDLE (MISCELLANEOUS) ×2 IMPLANT
DRAPE IMP U-DRAPE 54X76 (DRAPES) ×2 IMPLANT
DRAPE STERI IOBAN 125X83 (DRAPES) ×2 IMPLANT
DRAPE U-SHAPE 47X51 STRL (DRAPES) ×4 IMPLANT
DRSG MEPILEX POST OP 4X8 (GAUZE/BANDAGES/DRESSINGS) ×2 IMPLANT
ELECT REM PT RETURN 15FT ADLT (MISCELLANEOUS) ×2 IMPLANT
GLOVE BIO SURGEON STRL SZ7.5 (GLOVE) ×2 IMPLANT
GLOVE BIOGEL PI IND STRL 7.5 (GLOVE) ×2 IMPLANT
GLOVE BIOGEL PI IND STRL 8 (GLOVE) ×2 IMPLANT
GLOVE SURG SYN 7.5  E (GLOVE) ×1
GLOVE SURG SYN 7.5 E (GLOVE) ×1 IMPLANT
GLOVE SURG SYN 7.5 PF PI (GLOVE) ×2 IMPLANT
GOWN STRL REUS W/ TWL LRG LVL3 (GOWN DISPOSABLE) ×2 IMPLANT
GOWN STRL REUS W/ TWL XL LVL3 (GOWN DISPOSABLE) ×2 IMPLANT
GOWN STRL REUS W/TWL LRG LVL3 (GOWN DISPOSABLE) ×1
GOWN STRL REUS W/TWL XL LVL3 (GOWN DISPOSABLE) ×1
HEAD BIOLOX HIP 36/-5 (Joint) IMPLANT
HIP BIOLOX HD 36/-5 (Joint) ×1 IMPLANT
HOLDER FOLEY CATH W/STRAP (MISCELLANEOUS) IMPLANT
INSERT 0 DEGREE 36 (Miscellaneous) IMPLANT
KIT TURNOVER KIT A (KITS) IMPLANT
MANIFOLD NEPTUNE II (INSTRUMENTS) ×2 IMPLANT
NS IRRIG 1000ML POUR BTL (IV SOLUTION) ×2 IMPLANT
PACK ANTERIOR HIP CUSTOM (KITS) ×2 IMPLANT
PROTECTOR NERVE ULNAR (MISCELLANEOUS) ×2 IMPLANT
SCREW HEX LP 6.5X20 (Screw) IMPLANT
SHELL ACETAB TRIDENT 48 (Shell) IMPLANT
SPIKE FLUID TRANSFER (MISCELLANEOUS) ×4 IMPLANT
STEM HIP 4 127DEG (Stem) IMPLANT
STRIP CLOSURE SKIN 1/2X4 (GAUZE/BANDAGES/DRESSINGS) IMPLANT
SUT MNCRL AB 3-0 PS2 18 (SUTURE) ×2 IMPLANT
SUT VIC AB 0 CT1 36 (SUTURE) ×2 IMPLANT
SUT VIC AB 1 CT1 36 (SUTURE) ×2 IMPLANT
SUT VIC AB 2-0 CT1 27 (SUTURE) ×2
SUT VIC AB 2-0 CT1 TAPERPNT 27 (SUTURE) ×4 IMPLANT
TRAY FOLEY MTR SLVR 16FR STAT (SET/KITS/TRAYS/PACK) IMPLANT
TUBE SUCTION HIGH CAP CLEAR NV (SUCTIONS) ×2 IMPLANT
WATER STERILE IRR 1000ML POUR (IV SOLUTION) ×4 IMPLANT

## 2022-04-03 NOTE — Progress Notes (Signed)
  Echocardiogram 2D Echocardiogram has been performed. Results relayed to cardiology.   Janet Wilson 04/03/2022, 10:00 AM

## 2022-04-03 NOTE — Progress Notes (Signed)
Initial Nutrition Assessment  DOCUMENTATION CODES:   Non-severe (moderate) malnutrition in context of chronic illness  INTERVENTION:  - Advance diet to Regular after OR as medically appropriate. - Boost Plus po BID once diet advanced, each supplement provides 360 kcal and 14 grams of protein - Encourage intake at all meals of protein rich food sources.  - Daily multivitamin to support intake.  - Monitor weight trends.   NUTRITION DIAGNOSIS:   Moderate Malnutrition related to chronic illness as evidenced by mild fat depletion, mild muscle depletion.  GOAL:   Patient will meet greater than or equal to 90% of their needs  MONITOR:   PO intake, Diet advancement, Supplement acceptance, Weight trends  REASON FOR ASSESSMENT:   Consult Hip fracture protocol  ASSESSMENT:   88 y.o. female with PMH of dementia, CAD, GERD, glaucoma, goiter, HLD,  restless leg syndrome, and temporal arteritis  who presented with  left hip pain after fall. Found to have L hip fx.    Met with patient and family members at bedside this morning.  Patient reports a UBW of "a little over a hundred". At first denied changes in weight but family member in room reports patient has slowly lost weight over the past few years. Per EMR, Weight trended down then back up within the past year but no significant changes for the time frame.   Patient endorses eating 2 meals a day, usually a late breakfast around 10-11am and a dinner meal. Drinks Boost daily at home.  Patient currently NPO for OR today. Discussed increased calorie and protein needs to promote healing and help prevent weight loss. Patient agreeable to receive Boost once diet advanced.   Medications reviewed and include: Remeron  Labs reviewed:  -   NUTRITION - FOCUSED PHYSICAL EXAM:  Flowsheet Row Most Recent Value  Orbital Region Mild depletion  Upper Arm Region No depletion  Thoracic and Lumbar Region Mild depletion  Buccal Region No depletion   Temple Region Severe depletion  Clavicle Bone Region Mild depletion  Clavicle and Acromion Bone Region Moderate depletion  Scapular Bone Region Unable to assess  Dorsal Hand Moderate depletion  Patellar Region Mild depletion  Anterior Thigh Region Mild depletion  Posterior Calf Region Mild depletion  Edema (RD Assessment) Mild  Hair Reviewed  Eyes Reviewed  Mouth Reviewed  Skin Reviewed  Nails Reviewed       Diet Order:   Diet Order             Diet NPO time specified  Diet effective midnight                   EDUCATION NEEDS:  Education needs have been addressed  Skin:  Skin Assessment: Reviewed RN Assessment  Last BM:  PTA  Height:  Ht Readings from Last 1 Encounters:  04/02/22 5' 3.5" (1.613 m)   Weight:  Wt Readings from Last 1 Encounters:  04/02/22 47.6 kg   BMI:  Body mass index is 18.31 kg/m.  Estimated Nutritional Needs:  Kcal:  1550-1700 kcals Protein:  70-80 grams Fluid:  >/= 1.5L    Samson Frederic RD, LDN For contact information, refer to [facility].

## 2022-04-03 NOTE — TOC Initial Note (Signed)
Transition of Care [facility]) - Initial/Assessment Note    Patient Details  Name: Janet Wilson MRN: [id number] Date of Birth: 12/25/1934  [facility] CM/SW Contact:    Lennart Pall, LCSW Phone Number: 04/03/2022, 3:04 PM  Clinical Narrative:                 Met with pt and daughter today to discuss anticipated dc needs.  Pt confirms that she is a resident at [facility] in [place] but anticipated to dc to the SNF level for rehab.  She is undergoing surgery today and will await therapy evaluations tomorrow.  Pt agreeable with TOC CSW to follow to assist with her dc plans.  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)                 DME Arranged: N/A DME Agency: NA                  Prior Living Arrangements/Services Living arrangements for the past 2 months: [place] (@ [facility] x 7 yrs) Lives with:: Self Patient language and need for interpreter reviewed:: Yes Do you feel safe going back to the place where you live?: Yes      Need for Family Participation in Patient Care: No (Comment) Care giver support system in place?: Yes (comment)   Criminal Activity/Legal Involvement Pertinent to Current Situation/Hospitalization: No - Comment as needed  Activities of Daily Living Home Assistive Devices/Equipment: None ADL Screening (condition at time of admission) Patient's cognitive ability adequate to safely complete daily activities?: Yes Is the patient deaf or have difficulty hearing?: Yes (wears hearing  aids (left at home)) Does the patient have difficulty seeing, even when wearing glasses/contacts?: No Does the patient have difficulty concentrating, remembering, or making decisions?: No Patient able to express need for assistance with ADLs?: Yes Does the patient have difficulty dressing or bathing?: Yes Independently performs ADLs?: No Communication: Independent Dressing (OT): Needs assistance Is this a change from baseline?: Change from baseline, expected to last >3 days Grooming: Independent Feeding: Independent Bathing: Needs assistance Is this a change from baseline?: Change from baseline, expected to last >3 days Toileting: Needs assistance Is this a change from baseline?: Change from baseline, expected to last >3days In/Out Bed: Needs assistance Is this a change from baseline?: Change from baseline, expected to last >3 days Walks in Home: Needs assistance Is this a change from baseline?: Change from baseline, expected to last >3 days Does the patient have difficulty walking or climbing stairs?: Yes Weakness of Legs: Left Weakness of Arms/Hands: None  Permission Sought/Granted Permission sought to share information with : Family Supports Permission granted to share information with : Yes, Verbal Permission Granted  Share Information with NAME: Michiel Cowboy     Permission granted to share info w Relationship: daughter  Permission granted to share info w Contact Information: [phone]  Emotional Assessment Appearance:: Appears stated age Attitude/Demeanor/Rapport: Gracious Affect (typically observed): Accepting Orientation: : Oriented to Self, Oriented to Place, Oriented to  Time, Oriented to Situation  Alcohol / Substance Use: Not Applicable Psych Involvement: No (comment)  Admission diagnosis:  Closed left hip fracture (HCC) [S72.002A] Closed fracture of left hip, initial encounter [facility]) [S72.002A] Patient Active Problem List   Diagnosis Date Noted   Closed left  hip fracture ([place]) 04/02/2022   Aortic aneurysm ([place]) 04/02/2022   Acute respiratory failure with hypoxemia ([place]) 04/02/2022   Dementia ([place]) 01/21/2022   Post-nasal drip 12/20/2021   Gastroesophageal reflux disease without esophagitis 12/20/2021   Depression with anxiety 12/20/2021   Cellulitis of right leg 12/20/2021   Primary insomnia 12/20/2021   Hyperlipidemia 12/20/2021   Coronary artery calcification 12/18/2019   Aortic atherosclerosis ([place]) 12/18/2019   Pure hypercholesterolemia 12/18/2019   Senile osteopenia 03/20/2018   H/O temporal arteritis 03/20/2018   Memory loss, short term 03/20/2018   Weight loss 03/20/2018   Nausea 03/20/2018   Mixed urge and stress incontinence 03/20/2018   Multinodular goiter (nontoxic) 03/20/2018   RLS (restless legs syndrome) 12/19/2017   Pain in right knee 10/09/2017   Ectopic atrial tachycardia 10/08/2016   Cough 09/06/2015   Hoarseness of voice 09/06/2015   Rhinitis, chronic 09/06/2015   Bilateral occipital neuralgia 06/16/2015   Convergence insufficiency 08/09/2014   Neck pain 03/05/2013   Capsular glaucoma with pseudoexfoliation of lens, left eye, moderate stage 12/22/2012   Right temporal headache 06/30/2012   Family history of coronary artery disease 09/24/2011   S/P TKR (total knee replacement) 09/24/2011   Tachycardia 08/10/2011   OA (osteoarthritis) of knee 07/04/2011   PCP:  Virgie Dad, MD Pharmacy:   [id number] [place], [place] - [address] [place] [address]Phone: [phone]: [phone]    Social Determinants of Health (SDOH) Social History: SDOH Screenings   Food Insecurity: No Food Insecurity (04/02/2022)  Housing: Low Risk  (04/02/2022)  Transportation Needs: No Transportation Needs (04/02/2022)  Utilities: Not At Risk (04/02/2022)  Alcohol Screen: Low Risk  (03/19/2018)  Depression (PHQ2-9): Low Risk  (01/16/2022)  Tobacco  Use: Low Risk  (04/03/2022)   SDOH Interventions:     Readmission Risk Interventions    04/03/2022    3:01 PM  Readmission Risk Prevention Plan  Transportation Screening Complete  PCP or Specialist Appt within 5-7 Days Complete  Home Care Screening Complete  Medication Review (RN CM) Complete

## 2022-04-03 NOTE — Progress Notes (Signed)
Sister in law took her wedding ring and necklace/ pendant.

## 2022-04-03 NOTE — Transfer of Care (Signed)
Immediate Anesthesia Transfer of Care Note  Patient: Janet Wilson  Procedure(s) Performed: TOTAL HIP ARTHROPLASTY ANTERIOR APPROACH (Left: Hip)  Patient Location: PACU  Anesthesia Type:Spinal  Level of Consciousness: awake, alert , and patient cooperative  Airway & Oxygen Therapy: Patient Spontanous Breathing and Patient connected to face mask oxygen  Post-op Assessment: Report given to RN and Post -op Vital signs reviewed and stable  Post vital signs: Reviewed and stable  Last Vitals:  Vitals Value Taken Time  BP 112/61 04/03/22 1534  Temp    Pulse 76 04/03/22 1535  Resp 13 04/03/22 1535  SpO2 99 % 04/03/22 1535  Vitals shown include unvalidated device data.  Last Pain:  Vitals:   04/03/22 1148  TempSrc:   PainSc: 0-No pain         Complications: No notable events documented.

## 2022-04-03 NOTE — Op Note (Signed)
04/02/2022 - 04/03/2022  3:11 PM  PATIENT:  Janet Wilson   MRN: [id number]  PRE-OPERATIVE DIAGNOSIS:  left total hip fracture  POST-OPERATIVE DIAGNOSIS:  left total hip fracture  PROCEDURE:  Procedure(s): TOTAL HIP ARTHROPLASTY ANTERIOR APPROACH  PREOPERATIVE INDICATIONS:    Janet Wilson is an 88 y.o. female who has a diagnosis of Closed left hip fracture ([place]) and elected for surgical management after failing conservative treatment.  The risks benefits and alternatives were discussed with the patient including but not limited to the risks of nonoperative treatment, versus surgical intervention including infection, bleeding, nerve injury, periprosthetic fracture, the need for revision surgery, dislocation, leg length discrepancy, blood clots, cardiopulmonary complications, morbidity, mortality, among others, and they were willing to proceed.     OPERATIVE REPORT     SURGEON:   Renette Butters, MD    ASSISTANT:  Aggie Moats, PA-C, she was present and scrubbed throughout the case, critical for completion in a timely fashion, and for retraction, instrumentation, and closure.     ANESTHESIA:  General    COMPLICATIONS:  None.     COMPONENTS:  [employer]ker acolade fit femur size 3 with a 36 mm -5 head ball and an acetabular shell size 48 with a  polyethylene liner    PROCEDURE IN DETAIL:   The patient was met in the holding area and  identified.  The appropriate hip was identified and marked at the operative site.  The patient was then transported to the OR  and  placed under anesthesia per that record.  At that point, the patient was  placed in the supine position and  secured to the operating room table and all bony prominences padded. He received pre-operative antibiotics    The operative lower extremity was prepped from the iliac crest to the distal leg.  Sterile draping was performed.  Time out was performed prior to incision.      Skin incision was made  just 2 cm lateral to the ASIS  extending in line with the tensor fascia lata. Electrocautery was used to control all bleeders. I dissected down sharply to the fascia of the tensor fascia lata was confirmed that the muscle fibers beneath were running posteriorly. I then incised the fascia over the superficial tensor fascia lata in line with the incision. The fascia was elevated off the anterior aspect of the muscle the muscle was retracted posteriorly and protected throughout the case. I then used electrocautery to incise the tensor fascia lata fascia control and all bleeders. Immediately visible was the fat over top of the anterior neck and capsule.  I removed the anterior fat from the capsule and elevated the rectus muscle off of the anterior capsule. I then removed a large time of capsule. The retractors were then placed over the anterior acetabulum as well as around the superior and inferior neck.  I then made a femoral neck cut to remove the fractured portion of the neck. Then used the power corkscrew to remove the femoral head from the acetabulum and thoroughly irrigated the acetabulum. I sized the femoral head.    I then exposed the deep acetabulum, cleared out any tissue including the ligamentum teres.   After adequate visualization, I excised the labrum, and then sequentially reamed.  I then impacted the acetabular implant into place using fluoroscopy for guidance.  Appropriate version and inclination was confirmed clinically matching their bony anatomy, and with fluoroscopy.  I placed a 20 mm screw in the posterior/superio position with  an excellent bite.    I then placed the polyethylene liner in place  I then adducted the leg and released the external rotators from the posterior femur allowing it to be easily delivered up lateral and anterior to the acetabulum for preparation of the femoral canal.    I then prepared the proximal femur using the cookie-cutter and then sequentially reamed and  broached.  A trial broach, neck, and head was utilized, and I reduced the hip and used floroscopy to assess the neck length and femoral implant.  I then impacted the femoral prosthesis into place into the appropriate version. The hip was then reduced and fluoroscopy confirmed appropriate position. Leg lengths were restored.  I then irrigated the hip copiously again with, and repaired the fascia with Vicryl, followed by monocryl for the subcutaneous tissue, Monocryl for the skin, Steri-Strips and sterile gauze. The patient was then awakened and returned to PACU in stable and satisfactory condition. There were no complications.  POST OPERATIVE PLAN: WBAT, DVT px: SCD's/TED, ambulation and chemical dvt px  Edmonia Lynch, MD Orthopedic Surgeon [phone]

## 2022-04-03 NOTE — Interval H&P Note (Signed)
History and Physical Interval Note:  04/03/2022 1:09 PM  Janet Wilson  has presented today for surgery, with the diagnosis of left total hip fracture.  The various methods of treatment have been discussed with the patient and family. After consideration of risks, benefits and other options for treatment, the patient has consented to  Procedure(s): TOTAL HIP ARTHROPLASTY ANTERIOR APPROACH (Left) as a surgical intervention.  The patient's history has been reviewed, patient examined, no change in status, stable for surgery.  I have reviewed the patient's chart and labs.  Questions were answered to the patient's satisfaction.     Renette Butters

## 2022-04-03 NOTE — Consult Note (Signed)
CARDIOLOGY CONSULT NOTE       Patient ID: Janet Wilson MRN: [id number] DOB/AGE: 1934-09-16 88 y.o.  Admit date: 04/02/2022 Referring Physician: Percell Miller Primary Physician: Virgie Dad, MD Primary Cardiologist: Marlou Porch Reason for Consultation: Preoperative  Principal Problem:   Closed left hip fracture [facility]) Active Problems:   Pure hypercholesterolemia   Dementia ([place])   Aortic aneurysm ([place])   Acute respiratory failure with hypoxemia ([place])   HPI:  88 y.o. admitted with mechanical fall off husbands recliner Needs Left hip surgery for stability and ability to ambulate. She is normally active at home with no angina, dyspnea palpitation or syncope. She is followed by Dr Cyndia Bent for ascending thoracic aneurysm 5.7 cm Not an operative candidate due to 88 and surgery would require circulatory arrest with arch repair. She has no history of CAD. Last TTE done 2021 with normal EF 55-60% mild AS and moderate AR She has never had CHF. Pain control currently fine Surgery planned for today   ROS All other systems reviewed and negative except as noted above  Past Medical History:  Diagnosis Date   Aortic aneurysm (HCC)    Arthritis    OA BOTH KNEES AND HANDS   Atrial tachycardia    HX OF   GERD (gastroesophageal reflux disease)    Glaucoma    left eye   Goiter    CAUSING COUGH, HOARSINESS AND DRY THROAT   Headache(784.0)    MIGRAINES    Hyperlipidemia    Neuralgia    Overactive bladder    RLS (restless legs syndrome)    Temporal arteritis (HCC)     Family History  Problem Relation Age of Onset   Alzheimer's disease Mother    Heart attack Father     Social History   Socioeconomic History   Marital status: Married    Spouse name: [name]   Number of children: 2   Years of education: college   Highest education level: Not on file  Occupational History   Occupation: retired    Fish farm manager: RETIRED  Tobacco Use   Smoking status: Never   Smokeless  tobacco: Never  Vaping Use   Vaping Use: Never used  Substance and Sexual Activity   Alcohol use: Yes    Alcohol/week: 1.0 standard drink of alcohol    Types: 1 Glasses of wine per week    Comment: SELDOM, very little   Drug use: No   Sexual activity: Yes  Other Topics Concern   Not on file  Social History Narrative   Pt lives at home with family.   Caffeine Use: Very little.    Patient is right handed       Social History      Diet? No       Do you drink/eat things with caffeine? Some coffee      Marital status?    Married                                What year were you married? 1961      Do you live in a house, apartment, assisted living, condo, trailer, etc.?  [facility]       Is it one or more stories? one      How many persons live in your home? two      Do you have any pets in your home? No       Highest level of  education completed? 14 years       Current or past profession:  Chemical engineer       Do you exercise?              Yes                        Type & how often? Water aerobic and balance class      Advanced Directives      Do you have a living will?  yes      Do you have a DNR form?        yes                          If not, do you want to discuss one?      Do you have signed POA/HPOA for forms? yes      Functional Status      Do you have difficulty bathing or dressing yourself?      Do you have difficulty preparing food or eating?       Do you have difficulty managing your medications?      Do you have difficulty managing your finances?      Do you have difficulty affording your medications?   Social Determinants of Health   Financial Resource Strain: Not on file  Food Insecurity: No Food Insecurity (04/02/2022)   Hunger Vital Sign    Worried About Running Out of Food in the Last Year: Never true    Ran Out of Food in the Last Year: Never true  Transportation Needs: No Transportation Needs (04/02/2022)   PRAPARE - Armed forces logistics/support/administrative officer (Medical): No    Lack of Transportation (Non-Medical): No  Physical Activity: Not on file  Stress: Not on file  Social Connections: Not on file  Intimate Partner Violence: Not At Risk (04/02/2022)   Humiliation, Afraid, Rape, and Kick questionnaire    Fear of Current or Ex-Partner: No    Emotionally Abused: No    Physically Abused: No    Sexually Abused: No    Past Surgical History:  Procedure Laterality Date   ARTERY BIOPSY Right 07/21/2012   Procedure: BIOPSY TEMPORAL ARTERY;  Surgeon: Haywood Lasso, MD;  Location: [place];  Service: General;  Laterality: Right;   CERVICAL FUSION  2012   Dr. Vertell Limber   EYE SURGERY     CATARACT EXTRACTION- BILATERAL   INCONTINENCE SURGERY     JOINT REPLACEMENT     right knee   LEFT SHOULDER SURGERY     RIGHT KNEE ARTHROSCOPY  11/2009     ROBOTIC ASSISTED BILATERAL SALPINGO OOPHERECTOMY Bilateral 12/08/2013   Procedure: ROBOTIC ASSISTED LAPAROSCOPIC BILATERAL SALPINGO OOPHORECTOMY WITH STAGING;  Surgeon: Everitt Amber, MD;  Location: [facility];  Service: Gynecology;  Laterality: Bilateral;   TOTAL KNEE ARTHROPLASTY  07/04/2011   Procedure: TOTAL KNEE ARTHROPLASTY;  Surgeon: Gearlean Alf, MD;  Location: [facility];  Service: Orthopedics;  Laterality: Right;      Current Facility-Administered Medications:    fentaNYL (SUBLIMAZE) injection 50 mcg, 50 mcg, Intravenous, Q30 min PRN, Roel Cluck, Anastassia, MD, 50 mcg at 04/02/22 1642   HYDROcodone-acetaminophen (NORCO/VICODIN) 5-325 MG per tablet 1-2 tablet, 1-2 tablet, Oral, Q6H PRN, Doutova, Anastassia, MD   methocarbamol (ROBAXIN) tablet 500 mg, 500 mg, Oral, Q6H PRN **OR** methocarbamol (ROBAXIN) 500 mg in dextrose 5 % 50 mL IVPB, 500 mg, Intravenous,  Q6H PRN, Toy Baker, MD   mirtazapine (REMERON) tablet 7.5 mg, 7.5 mg, Oral, QHS, Doutova, Anastassia, MD, 7.5 mg at 04/02/22 2255   morphine (PF) 2 MG/ML injection 0.5 mg, 0.5 mg, Intravenous, Q2H PRN, Doutova, Anastassia, MD    rosuvastatin (CRESTOR) tablet 10 mg, 10 mg, Oral, Daily, Doutova, Anastassia, MD  mirtazapine  7.5 mg Oral QHS   rosuvastatin  10 mg Oral Daily    methocarbamol (ROBAXIN) IV      Physical Exam: Blood pressure (!) 159/68, pulse 84, temperature 98 F (36.7 C), resp. rate 18, height 5' 3.5" (1.613 m), weight 47.6 kg, SpO2 100 %.     Elderly female MS normal ( diagnosis mild dementia )  SEM and AR murmur Abdomen benign No bruits Left hip fracture  No edema  Labs:   Lab Results  Component Value Date   WBC 5.5 04/02/2022   HGB 12.2 04/02/2022   HCT 38.9 04/02/2022   MCV 97.7 04/02/2022   PLT 116 (L) 04/02/2022    Recent Labs  Lab 04/02/22 1650  NA 144  K 3.7  CL 110  CO2 26  BUN 25*  CREATININE 0.60  CALCIUM 8.5*  PROT 6.2*  BILITOT 0.6  ALKPHOS 58  ALT 20  AST 25  GLUCOSE 86   Lab Results  Component Value Date   CKTOTAL 89 04/02/2022   CKMB 1.4 08/11/2011   TROPONINI <0.30 08/15/2011    Lab Results  Component Value Date   CHOL 134 03/18/2020   CHOL 194 12/11/2017   Lab Results  Component Value Date   HDL 67 03/18/2020   HDL 59 12/11/2017   Lab Results  Component Value Date   LDLCALC 57 03/18/2020   LDLCALC 125 12/11/2017   Lab Results  Component Value Date   TRIG 39 03/18/2020   TRIG 53 12/11/2017   Lab Results  Component Value Date   CHOLHDL 2.0 03/18/2020   No results found for: "LDLDIRECT"    Radiology: CT CERVICAL SPINE WO CONTRAST  Result Date: 04/02/2022 CLINICAL DATA:  [place] from chair EXAM: CT CERVICAL SPINE WITHOUT CONTRAST TECHNIQUE: Multidetector CT imaging of the cervical spine was performed without intravenous contrast. Multiplanar CT image reconstructions were also generated. RADIATION DOSE REDUCTION: This exam was performed according to the departmental dose-optimization program which includes automated exposure control, adjustment of the mA and/or kV according to patient size and/or use of iterative reconstruction technique.  COMPARISON:  02/23/2021 FINDINGS: Alignment: Stable mild degenerative anterolisthesis of C7 on T1. Otherwise alignment is grossly anatomic. Skull base and vertebrae: No acute fracture. No primary bone lesion or focal pathologic process. Soft tissues and spinal canal: No prevertebral fluid or swelling. No visible canal hematoma. Disc levels: Prior ACDF spanning C4 through C7. There is mild diffuse spondylosis and facet hypertrophy throughout the nonsurgical levels. No change since prior exam. Upper chest: Airway is patent. Lung apices are clear. Known thyroid goiter, previously evaluated by ultrasound and biopsy. Other: Reconstructed images demonstrate no additional findings. IMPRESSION: 1. No acute cervical spine fracture. 2. Stable postsurgical and degenerative changes of the cervical spine. Electronically Signed   By: Randa Ngo M.D.   On: 04/02/2022 19:28   CT HEAD WO CONTRAST  Result Date: 04/02/2022 CLINICAL DATA:  [place] from chair EXAM: CT HEAD WITHOUT CONTRAST TECHNIQUE: Contiguous axial images were obtained from the base of the skull through the vertex without intravenous contrast. RADIATION DOSE REDUCTION: This exam was performed according to the departmental dose-optimization program which includes automated  exposure control, adjustment of the mA and/or kV according to patient size and/or use of iterative reconstruction technique. COMPARISON:  02/23/2021 FINDINGS: Brain: Chronic small vessel ischemic changes within the white matter and bilateral basal ganglia. No acute infarct or hemorrhage. Lateral ventricles and midline structures are unremarkable. No acute extra-axial fluid collections. No mass effect. Vascular: No hyperdense vessel or unexpected calcification. Skull: Normal. Negative for fracture or focal lesion. Sinuses/Orbits: Chronic left sphenoid sinus disease. The remaining paranasal sinuses are clear. Other: None. IMPRESSION: 1. No acute intracranial process. Electronically Signed   By:  Randa Ngo M.D.   On: 04/02/2022 19:25   DG Chest Port 1 View  Result Date: 04/02/2022 CLINICAL DATA:  Pain, left hip fracture, preoperative evaluation EXAM: PORTABLE CHEST 1 VIEW COMPARISON:  11/05/2021, 10/04/2021 FINDINGS: Single frontal view of the chest demonstrates stable enlargement of the cardiac silhouette. Dilated ectatic thoracic aorta compatible with known ascending thoracic aortic aneurysm. Increased density in the right paratracheal region consistent with vascular shadow and thyroid. No acute airspace disease, effusion, or pneumothorax. No acute bony abnormality. IMPRESSION: 1. Continued dilation and ectasia of the thoracic aorta consistent with known thoracic aortic aneurysm. 2. No acute airspace disease. Electronically Signed   By: Randa Ngo M.D.   On: 04/02/2022 18:54   DG Hip Unilat With Pelvis 2-3 Views Left  Result Date: 04/02/2022 CLINICAL DATA:  Pain after fall EXAM: DG HIP (WITH OR WITHOUT PELVIS) 3V LEFT COMPARISON:  None Available. FINDINGS: Portable x-rays. Left femoral neck fracture with displacement, foreshortening and angulation. Underlying osteopenia. Concentric joint space loss of both hips. Degenerative changes. Foley catheter. IMPRESSION: Displaced subcapital femoral neck fracture. Displaced, impacted femoral neck fracture on the left Electronically Signed   By: Jill Side M.D.   On: 04/02/2022 17:23    EKG: SR rate 70 LBBB chronic   ASSESSMENT AND PLAN:   Preoperative:  clear to have left hip surgery with Dr Percell Miller under spinal Need to preserve ability to walk. Ascending thoracic aneurysm is non operable and BP well controlled No history of CAD and activity level at home good > 5 METS. At risk for post op PAF given age and CHF given AR.  Keep post op I/O's even to negative avoid volume overload Give lasix with any need for transfusion LBBB:  chronic stable rhythm   If any issues with heart post operatively can update echo to see if AV dx worse EF has  been normal   Signed: Jenkins Rouge 04/03/2022, 9:27 AM

## 2022-04-03 NOTE — Plan of Care (Signed)
  Problem: Education: Goal: Knowledge of General Education information will improve Description: Including pain rating scale, medication(s)/side effects and non-pharmacologic comfort measures Outcome: Progressing   Problem: Pain Managment: Goal: General experience of comfort will improve Outcome: Progressing   Problem: Safety: Goal: Ability to remain free from injury will improve Outcome: Progressing   

## 2022-04-03 NOTE — Plan of Care (Signed)

## 2022-04-03 NOTE — Anesthesia Postprocedure Evaluation (Signed)
Anesthesia Post Note  Patient: Janet Wilson  Procedure(s) Performed: TOTAL HIP ARTHROPLASTY ANTERIOR APPROACH (Left: Hip)     Patient location during evaluation: PACU Anesthesia Type: Spinal Level of consciousness: oriented and awake and alert Pain management: pain level controlled Vital Signs Assessment: post-procedure vital signs reviewed and stable Respiratory status: spontaneous breathing, respiratory function stable and nonlabored ventilation Cardiovascular status: blood pressure returned to baseline and stable Postop Assessment: no headache, no backache, no apparent nausea or vomiting and spinal receding Anesthetic complications: no   No notable events documented.  Last Vitals:  Vitals:   04/03/22 1615 04/03/22 1630  BP: (!) 118/56 (!) 113/54  Pulse: 71 69  Resp: 18 11  Temp:  (!) 36.4 C  SpO2: 97% 97%    Last Pain:  Vitals:   04/03/22 1630  TempSrc:   PainSc: 0-No pain                 Jemiah Cuadra A.

## 2022-04-03 NOTE — Progress Notes (Signed)
[facility] Progress Note  Patient: Janet Wilson     [id number]  DOA: 04/02/2022   PCP: Virgie Dad, MD       Brief hospital course: 88 yo with AAA, mild AS, AI, migraines, temporal arteritis, LBBB who fell and developed severe pain in her left hip and groin. She laid on the floor for about 1 hr. In the ED she was found to have a left hip fracture. Ortho was consulted.  She became hypoxic after Fentanyl IV. This resolved.   Subjective:  Pain in left hip when she moves Assessment and Plan: Principal Problem:   Closed left hip fracture  -  left THA today - f/u PT, DVT recommendations from ortho - f/u Hgb tomorrow  Active Problems:     Dementia ([place]) - resume Exelon after surgery    Aortic aneurysm (HCC) - 5.7 cm - not a surgical candidate - follow BP  LBBB   GERD - cont PPI     Code Status: DNR Consultants: ortho Level of Care: Level of care: Telemetry Total time on patient care: 35 DVT prophylaxis:  TED hose to OR Start: 04/03/22 1135 SCDs Start: 04/02/22 2127     Objective:   Vitals:   04/03/22 0445 04/03/22 1019 04/03/22 1141 04/03/22 1148  BP: (!) 159/68 (!) 153/62 (!) 151/59   Pulse: 84 79 80   Resp: 18 18 18   $ Temp: 98 F (36.7 C) 98.5 F (36.9 C) 98.2 F (36.8 C)   TempSrc:  Oral Oral   SpO2: 100% 100% 94%   Weight:    45.8 kg  Height:    5' 3.5" (1.613 m)   Filed Weights   04/02/22 1606 04/03/22 1148  Weight: 47.6 kg 45.8 kg   Exam: General exam: Appears comfortable  HEENT: oral mucosa moist Respiratory system: Clear to auscultation.  Cardiovascular system: S1 & S2 heard  Gastrointestinal system: Abdomen soft, non-tender, nondistended. Normal bowel sounds   Extremities: No cyanosis, clubbing- left thigh swelling Psychiatry:  Mood & affect appropriate.      CBC: Recent Labs  Lab 04/02/22 1650  WBC 5.5  NEUTROABS 4.1  HGB 12.2  HCT 38.9  MCV 97.7  PLT [id number]*   Basic Metabolic Panel: Recent Labs   Lab 04/02/22 1650  NA 144  K 3.7  CL 110  CO2 26  GLUCOSE 86  BUN 25*  CREATININE 0.60  CALCIUM 8.5*  MG 1.9  PHOS 3.2   GFR: Estimated Creatinine Clearance: 36.5 mL/min (by C-G formula based on SCr of 0.6 mg/dL).  Scheduled Meds:  chlorhexidine  60 mL Topical Once   dexamethasone (DECADRON) injection  8 mg Intravenous Once   [MAR Hold] mirtazapine  7.5 mg Oral QHS   [MAR Hold] multivitamin with minerals  1 tablet Oral Daily   [MAR Hold] rosuvastatin  10 mg Oral Daily   Continuous Infusions:  lactated ringers 10 mL/hr at 04/03/22 1329   [MAR Hold] methocarbamol (ROBAXIN) IV     Imaging and lab data was personally reviewed DG HIP UNILAT WITH PELVIS 2-3 VIEWS LEFT  Result Date: 04/03/2022 CLINICAL DATA:  [id number] Hip fracture ([place]) [id number] EXAM: DG HIP (WITH OR WITHOUT PELVIS) 2-3V LEFT COMPARISON:  04/02/2022 FINDINGS: Postsurgical changes of left total hip arthroplasty. Normal alignment. No evidence of immediate hardware complication. IMPRESSION: Postsurgical changes of left total hip arthroplasty. No evidence of immediate hardware complication. Normal alignment. Electronically Signed   By: Maurine Simmering M.D.   On: 04/03/2022 15:02  DG C-Arm 1-60 Min-No Report  Result Date: 04/03/2022 Fluoroscopy was utilized by the requesting physician.  No radiographic interpretation.   DG C-Arm 1-60 Min-No Report  Result Date: 04/03/2022 Fluoroscopy was utilized by the requesting physician.  No radiographic interpretation.   ECHOCARDIOGRAM COMPLETE  Result Date: 04/03/2022    ECHOCARDIOGRAM REPORT   Patient Name:   Janet Wilson Date of Exam: 04/03/2022 Medical Rec #:  [id number]                   Height:       63.5 in Accession #:    [id number]                  Weight:       105.0 lb Date of Birth:  10-23-1934                   BSA:          1.479 m Patient Age:    63 years                    BP:           159/68 mmHg Patient Gender: F                           HR:           80  bpm. Exam Location:  Inpatient Procedure: 2D Echo, 3D Echo, Cardiac Doppler and Color Doppler STAT ECHO REPORT CONTAINS CRITICAL RESULT Indications:    Z01.818 Encounter for other preprocedural examination  History:        Patient has prior history of Echocardiogram examinations, most                 recent 01/12/2020. CAD, Abnormal ECG, Aortic Valve Disease;                 Arrythmias:A TACH and LBBB. Ascending aortic aneurysm. AI.  Sonographer:    Roseanna Rainbow RDCS Referring Phys: [place]  Sonographer Comments: Technically difficult study due to poor echo windows. Patient with hip fracture. Could not turn. IMPRESSIONS  1. Aortic dilatation noted. There is severe dilatation of the ascending aorta, measuring 60 mm.  2. Aortic regurgitation mechanism appears to be from annular dilation. There is a largely central jet. The aortic valve is tricuspid. There is moderate calcification of the aortic valve. Aortic valve regurgitation is moderate to severe. Mild aortic valve stenosis. Aortic valve mean gradient measures 7.0 mmHg.  3. Left ventricular ejection fraction, by estimation, is 55%. The left ventricle has low normal function. The left ventricle has no regional wall motion abnormalities. There is moderate asymmetric left ventricular hypertrophy of the septal segment. Left  ventricular diastolic parameters are indeterminate. Elevated left atrial pressure.  4. Right ventricular systolic function is normal. The right ventricular size is normal. There is mildly elevated pulmonary artery systolic pressure. The estimated right ventricular systolic pressure is [id number] mmHg.  5. 2D MVA 2.89 cm2, continuity equation defered in the setting of AI; suspect mild mitral stenosis. The mitral valve is degenerative. No evidence of mitral valve regurgitation. The mean mitral valve gradient is 4.0 mmHg.  6. The inferior vena cava is dilated in size with <50% respiratory variability, suggesting right atrial pressure of 15  mmHg. Comparison(s): Prior images reviewed side by side. Aortic maximum dimension has increased from prior; recommend clinical correlation with recent CT aorta protocol  as oblique echo measurements can lead to over-estimation. Conclusion(s)/Recommendation(s): Reaching out to inpatient cardioogy team. FINDINGS  Left Ventricle: Left ventricular ejection fraction, by estimation, is 55%. The left ventricle has low normal function. The left ventricle has no regional wall motion abnormalities. The left ventricular internal cavity size was normal in size. There is moderate asymmetric left ventricular hypertrophy of the septal segment. Left ventricular diastolic function could not be evaluated due to mitral annular calcification (moderate or greater). Left ventricular diastolic parameters are indeterminate. Elevated left atrial pressure. Right Ventricle: The right ventricular size is normal. No increase in right ventricular wall thickness. Right ventricular systolic function is normal. There is mildly elevated pulmonary artery systolic pressure. The tricuspid regurgitant velocity is 2.39  m/s, and with an assumed right atrial pressure of 15 mmHg, the estimated right ventricular systolic pressure is [id number] mmHg. Left Atrium: Left atrial size was normal in size. Right Atrium: Right atrial size was normal in size. Pericardium: Trivial pericardial effusion is present. The pericardial effusion is posterior to the left ventricle. Mitral Valve: 2D MVA 2.89 cm2, continuity equation defered in the setting of AI; suspect mild mitral stenosis. The mitral valve is degenerative in appearance. No evidence of mitral valve regurgitation. MV peak gradient, 8.6 mmHg. The mean mitral valve gradient is 4.0 mmHg with average heart rate of 82 bpm. Tricuspid Valve: The tricuspid valve is not well visualized. Tricuspid valve regurgitation is trivial. No evidence of tricuspid stenosis. Aortic Valve: Aortic regurgitation mechanism appears to be from  annular dilation. There is a largely central jet. The aortic valve is tricuspid. There is moderate calcification of the aortic valve. Aortic valve regurgitation is moderate to severe. Aortic  regurgitation PHT measures 529 msec. Mild aortic stenosis is present. Aortic valve mean gradient measures 7.0 mmHg. Aortic valve peak gradient measures 12.5 mmHg. Aortic valve area, by VTI measures 3.12 cm. Pulmonic Valve: The pulmonic valve was not well visualized. Pulmonic valve regurgitation is mild to moderate. No evidence of pulmonic stenosis. Aorta: Aortic dilatation noted. There is severe dilatation of the ascending aorta, measuring 60 mm. Venous: The inferior vena cava is dilated in size with less than 50% respiratory variability, suggesting right atrial pressure of 15 mmHg. IAS/Shunts: The interatrial septum was not well visualized.  LEFT VENTRICLE PLAX 2D LVIDd:         3.70 cm      Diastology LVIDs:         2.70 cm      LV e' medial:    4.46 cm/s LV PW:         1.40 cm      LV E/e' medial:  21.9 LV IVS:        1.30 cm      LV e' lateral:   9.03 cm/s LVOT diam:     2.40 cm      LV E/e' lateral: 10.8 LV SV:         105 LV SV Index:   71 LVOT Area:     4.52 cm  LV Volumes (MOD) LV vol d, MOD A2C: 96.4 ml LV vol d, MOD A4C: 138.0 ml LV vol s, MOD A2C: 39.2 ml LV vol s, MOD A4C: 72.7 ml LV SV MOD A2C:     57.2 ml LV SV MOD A4C:     138.0 ml LV SV MOD BP:      58.5 ml RIGHT VENTRICLE            IVC RV S prime:  5.66 cm/s  IVC diam: 2.20 cm TAPSE (M-mode): 1.3 cm LEFT ATRIUM             Index        RIGHT ATRIUM          Index LA diam:        2.90 cm 1.96 cm/m   RA Area:     4.90 cm LA Vol (A2C):   34.1 ml 23.05 ml/m  RA Volume:   6.28 ml  4.25 ml/m LA Vol (A4C):   32.1 ml 21.70 ml/m LA Biplane Vol: 36.1 ml 24.41 ml/m  AORTIC VALVE                     PULMONIC VALVE AV Area (Vmax):    3.14 cm      PR End Diast Vel: 1.90 msec AV Area (Vmean):   3.01 cm AV Area (VTI):     3.12 cm AV Vmax:           177.00 cm/s  AV Vmean:          124.000 cm/s AV VTI:            0.335 m AV Peak Grad:      12.5 mmHg AV Mean Grad:      7.0 mmHg LVOT Vmax:         123.00 cm/s LVOT Vmean:        82.600 cm/s LVOT VTI:          0.231 m LVOT/AV VTI ratio: 0.69 AI PHT:            529 msec  AORTA Ao Root diam: 3.30 cm Ao Asc diam:  5.90 cm MITRAL VALVE                TRICUSPID VALVE MV Area (PHT): 5.54 cm     TR Peak grad:   22.8 mmHg MV Area VTI:   3.21 cm     TR Vmax:        239.00 cm/s MV Peak grad:  8.6 mmHg MV Mean grad:  4.0 mmHg     SHUNTS MV Vmax:       1.47 m/s     Systemic VTI:  0.23 m MV Vmean:      93.6 cm/s    Systemic Diam: 2.40 cm MV Decel Time: 137 msec MV E velocity: 97.50 cm/s MV A velocity: 126.00 cm/s MV E/A ratio:  0.77 Rudean Haskell MD Electronically signed by Rudean Haskell MD Signature Date/Time: 04/03/2022/10:20:28 AM    Final    CT CERVICAL SPINE WO CONTRAST  Result Date: 04/02/2022 CLINICAL DATA:  [place] from chair EXAM: CT CERVICAL SPINE WITHOUT CONTRAST TECHNIQUE: Multidetector CT imaging of the cervical spine was performed without intravenous contrast. Multiplanar CT image reconstructions were also generated. RADIATION DOSE REDUCTION: This exam was performed according to the departmental dose-optimization program which includes automated exposure control, adjustment of the mA and/or kV according to patient size and/or use of iterative reconstruction technique. COMPARISON:  02/23/2021 FINDINGS: Alignment: Stable mild degenerative anterolisthesis of C7 on T1. Otherwise alignment is grossly anatomic. Skull base and vertebrae: No acute fracture. No primary bone lesion or focal pathologic process. Soft tissues and spinal canal: No prevertebral fluid or swelling. No visible canal hematoma. Disc levels: Prior ACDF spanning C4 through C7. There is mild diffuse spondylosis and facet hypertrophy throughout the nonsurgical levels. No change since prior exam. Upper chest: Airway is patent. Lung apices are clear.  Known thyroid goiter, previously evaluated by ultrasound and biopsy. Other: Reconstructed images demonstrate no additional findings. IMPRESSION: 1. No acute cervical spine fracture. 2. Stable postsurgical and degenerative changes of the cervical spine. Electronically Signed   By: Randa Ngo M.D.   On: 04/02/2022 19:28   CT HEAD WO CONTRAST  Result Date: 04/02/2022 CLINICAL DATA:  [place] from chair EXAM: CT HEAD WITHOUT CONTRAST TECHNIQUE: Contiguous axial images were obtained from the base of the skull through the vertex without intravenous contrast. RADIATION DOSE REDUCTION: This exam was performed according to the departmental dose-optimization program which includes automated exposure control, adjustment of the mA and/or kV according to patient size and/or use of iterative reconstruction technique. COMPARISON:  02/23/2021 FINDINGS: Brain: Chronic small vessel ischemic changes within the white matter and bilateral basal ganglia. No acute infarct or hemorrhage. Lateral ventricles and midline structures are unremarkable. No acute extra-axial fluid collections. No mass effect. Vascular: No hyperdense vessel or unexpected calcification. Skull: Normal. Negative for fracture or focal lesion. Sinuses/Orbits: Chronic left sphenoid sinus disease. The remaining paranasal sinuses are clear. Other: None. IMPRESSION: 1. No acute intracranial process. Electronically Signed   By: Randa Ngo M.D.   On: 04/02/2022 19:25   DG Chest Port 1 View  Result Date: 04/02/2022 CLINICAL DATA:  Pain, left hip fracture, preoperative evaluation EXAM: PORTABLE CHEST 1 VIEW COMPARISON:  11/05/2021, 10/04/2021 FINDINGS: Single frontal view of the chest demonstrates stable enlargement of the cardiac silhouette. Dilated ectatic thoracic aorta compatible with known ascending thoracic aortic aneurysm. Increased density in the right paratracheal region consistent with vascular shadow and thyroid. No acute airspace disease, effusion, or  pneumothorax. No acute bony abnormality. IMPRESSION: 1. Continued dilation and ectasia of the thoracic aorta consistent with known thoracic aortic aneurysm. 2. No acute airspace disease. Electronically Signed   By: Randa Ngo M.D.   On: 04/02/2022 18:54   DG Hip Unilat With Pelvis 2-3 Views Left  Result Date: 04/02/2022 CLINICAL DATA:  Pain after fall EXAM: DG HIP (WITH OR WITHOUT PELVIS) 3V LEFT COMPARISON:  None Available. FINDINGS: Portable x-rays. Left femoral neck fracture with displacement, foreshortening and angulation. Underlying osteopenia. Concentric joint space loss of both hips. Degenerative changes. Foley catheter. IMPRESSION: Displaced subcapital femoral neck fracture. Displaced, impacted femoral neck fracture on the left Electronically Signed   By: Jill Side M.D.   On: 04/02/2022 17:23    LOS: 1 day   Author: Debbe Odea  04/03/2022 3:25 PM  To contact [facility]>   Check the care team in [facility] and look for the attending/consulting [place] provider listed  Log into [email or web address] and use [place]'s universal password   Go to> "[facility]"  and find provider  If you still have difficulty reaching the provider, please page the [facility] (Director on Call) for the Hospitalists listed on amion

## 2022-04-03 NOTE — Anesthesia Procedure Notes (Signed)
Spinal  Patient location during procedure: OR Start time: 04/03/2022 2:40 PM End time: 04/03/2022 2:46 PM Reason for block: surgical anesthesia Staffing Performed by: Josephine Igo, MD Authorized by: Josephine Igo, MD   Preanesthetic Checklist Completed: patient identified, IV checked, site marked, risks and benefits discussed, surgical consent, monitors and equipment checked, pre-op evaluation and timeout performed Spinal Block Patient position: left lateral decubitus Prep: DuraPrep and site prepped and draped Patient monitoring: heart rate, cardiac monitor, continuous pulse ox and blood pressure Approach: midline Location: L3-4 Injection technique: single-shot Needle Needle type: Pencan  Needle gauge: 24 G Needle length: 9 cm Needle insertion depth: 6 cm Assessment Sensory level: T6 Events: CSF return Additional Notes Patient tolerated procedure well. Adequate sensory level. Technically difficult, attempt x 3.

## 2022-04-03 NOTE — Anesthesia Preprocedure Evaluation (Addendum)
Anesthesia Evaluation  Patient identified by MRN, date of birth, ID band Patient awake    Reviewed: Allergy & Precautions, NPO status , Patient's Chart, lab work & pertinent test results, reviewed documented beta blocker date and time   Airway Mallampati: II  TM Distance: >3 FB     Dental no notable dental hx. (+) Teeth Intact, Caps, Dental Advisory Given   Pulmonary neg pulmonary ROS   Pulmonary exam normal breath sounds clear to auscultation       Cardiovascular + CAD  + dysrhythmias + Valvular Problems/Murmurs AS and AI  Rhythm:Regular Rate:Normal + Systolic murmurs TAA 6cm on echo from today, followed by Dr. Cyndia Bent, not a surgical candidate  EKG 04/02/22 NSR, LBBB pattern  Hx/o atrial tachycardia  Echo  01/12/20  1. Aortic dilatation noted. There is severe dilatation of the ascending  aorta, measuring 60 mm.   2. Aortic regurgitation mechanism appears to be from annular dilation.  There is a largely central jet. The aortic valve is tricuspid. There is  moderate calcification of the aortic valve. Aortic valve regurgitation is  moderate to severe. Mild aortic  valve stenosis. Aortic valve mean gradient measures 7.0 mmHg.   3. Left ventricular ejection fraction, by estimation, is 55%. The left  ventricle has low normal function. The left ventricle has no regional wall  motion abnormalities. There is moderate asymmetric left ventricular  hypertrophy of the septal segment. Left ventricular diastolic parameters are indeterminate. Elevated left atrial pressure.   4. Right ventricular systolic function is normal. The right ventricular  size is normal. There is mildly elevated pulmonary artery systolic  pressure. The estimated right ventricular systolic pressure is [id number] mmHg.   5. 2D MVA 2.89 cm2, continuity equation defered in the setting of AI;  suspect mild mitral stenosis. The mitral valve is degenerative. No  evidence of  mitral valve regurgitation. The mean mitral valve gradient is  4.0 mmHg.   6. The inferior vena cava is dilated in size with <50% respiratory  variability, suggesting right atrial pressure of 15 mmHg.      Neuro/Psych  Headaches PSYCHIATRIC DISORDERS Anxiety Depression   Dementia Bilateral Occipital neuralgia Glaucoma OS Restless legs syndrome  Neuromuscular disease    GI/Hepatic Neg liver ROS,GERD  Medicated,,  Endo/Other  Hx/o goiter  Renal/GU negative Renal ROS  negative genitourinary   Musculoskeletal  (+) Arthritis , Osteoarthritis,  Fx Left Hip   Abdominal   Peds  Hematology negative hematology ROS (+)   Anesthesia Other Findings   Reproductive/Obstetrics                              Anesthesia Physical Anesthesia Plan  ASA: 3  Anesthesia Plan: Spinal   Post-op Pain Management: Minimal or no pain anticipated and Regional block*   Induction: Intravenous  PONV Risk Score and Plan: 3 and Treatment may vary due to age or medical condition, Propofol infusion and Ondansetron  Airway Management Planned: Natural Airway and Simple Face Mask  Additional Equipment: None  Intra-op Plan:   Post-operative Plan:   Informed Consent: I have reviewed the patients History and Physical, chart, labs and discussed the procedure including the risks, benefits and alternatives for the proposed anesthesia with the patient or authorized representative who has indicated his/her understanding and acceptance.   Patient has DNR.  Discussed DNR with patient and Suspend DNR.   Dental advisory given  Plan Discussed with: Anesthesiologist and CRNA  Anesthesia  Plan Comments:          Anesthesia Quick Evaluation

## 2022-04-04 ENCOUNTER — Encounter (HOSPITAL_COMMUNITY): Payer: Self-pay | Admitting: Orthopedic Surgery

## 2022-04-04 DIAGNOSIS — I359 Nonrheumatic aortic valve disorder, unspecified: Secondary | ICD-10-CM | POA: Diagnosis not present

## 2022-04-04 DIAGNOSIS — I7121 Aneurysm of the ascending aorta, without rupture: Secondary | ICD-10-CM | POA: Diagnosis not present

## 2022-04-04 DIAGNOSIS — S72002A Fracture of unspecified part of neck of left femur, initial encounter for closed fracture: Secondary | ICD-10-CM | POA: Diagnosis not present

## 2022-04-04 LAB — CBC
HCT: 38.3 % (ref 36.0–46.0)
Hemoglobin: 12.3 g/dL (ref 12.0–15.0)
MCH: 30.7 pg (ref 26.0–34.0)
MCHC: 32.1 g/dL (ref 30.0–36.0)
MCV: 95.5 fL (ref 80.0–100.0)
Platelets: 144 10*3/uL — ABNORMAL LOW (ref 150–400)
RBC: 4.01 MIL/uL (ref 3.87–5.11)
RDW: 13.1 % (ref 11.5–15.5)
WBC: 9.2 10*3/uL (ref 4.0–10.5)
nRBC: 0 % (ref 0.0–0.2)

## 2022-04-04 LAB — BASIC METABOLIC PANEL
Anion gap: 7 (ref 5–15)
BUN: 20 mg/dL (ref 8–23)
CO2: 26 mmol/L (ref 22–32)
Calcium: 8.5 mg/dL — ABNORMAL LOW (ref 8.9–10.3)
Chloride: 105 mmol/L (ref 98–111)
Creatinine, Ser: 0.68 mg/dL (ref 0.44–1.00)
GFR, Estimated: >60 mL/min (ref >60)
Glucose, Bld: 121 mg/dL — ABNORMAL HIGH (ref 70–99)
Potassium: 3.7 mmol/L (ref 3.5–5.1)
Sodium: 138 mmol/L (ref 135–145)

## 2022-04-04 LAB — T4, FREE: Free T4: 0.99 ng/dL (ref 0.61–1.12)

## 2022-04-04 MED ORDER — SENNOSIDES-DOCUSATE SODIUM 8.6-50 MG PO TABS
1.0000 | ORAL_TABLET | Freq: Two times a day (BID) | ORAL | Status: DC
  Administered 2022-04-04 – 2022-04-05 (×3): 1 via ORAL
  Filled 2022-04-04 (×3): qty 1

## 2022-04-04 MED ORDER — POLYETHYLENE GLYCOL 3350 17 G PO PACK
17.0000 g | PACK | Freq: Every day | ORAL | Status: DC
  Administered 2022-04-04 – 2022-04-05 (×2): 17 g via ORAL
  Filled 2022-04-04 (×2): qty 1

## 2022-04-04 NOTE — NC FL2 (Signed)
[place] LEVEL OF CARE FORM     IDENTIFICATION  Patient Name: Janet Wilson Birthdate: [birth year] Sex: female Admission Date (Current Location): 04/02/2022  [facility] and [place] Number:  Herbalist and Address:  [facility],  [address], [place]      Provider Number: [id number]  Attending Physician Name and Address:  Shelly Coss, MD  Relative Name and Phone Number:  spouse, Suhail Deanda [phone]    Current Level of Care: Hospital Recommended Level of Care: [place] Prior Approval Number:    Date Approved/Denied:   PASRR Number:    Discharge Plan: SNF    Current Diagnoses: Patient Active Problem List   Diagnosis Date Noted   Aortic valve disease 04/04/2022   Closed left hip fracture ([place]) 04/02/2022   Aortic aneurysm ([place]) 04/02/2022   Dementia ([place]) 01/21/2022   Post-nasal drip 12/20/2021   Gastroesophageal reflux disease without esophagitis 12/20/2021   Depression with anxiety 12/20/2021   Cellulitis of right leg 12/20/2021   Primary insomnia 12/20/2021   Hyperlipidemia 12/20/2021   Coronary artery calcification 12/18/2019   Aortic atherosclerosis ([place]) 12/18/2019   Pure hypercholesterolemia 12/18/2019   Senile osteopenia 03/20/2018   H/O temporal arteritis 03/20/2018   Memory loss, short term 03/20/2018   Weight loss 03/20/2018   Nausea 03/20/2018   Mixed urge and stress incontinence 03/20/2018   Multinodular goiter (nontoxic) 03/20/2018   RLS (restless legs syndrome) 12/19/2017   Pain in right knee 10/09/2017   Ectopic atrial tachycardia 10/08/2016   Cough 09/06/2015   Hoarseness of voice 09/06/2015   Rhinitis, chronic 09/06/2015   Bilateral occipital neuralgia 06/16/2015   Convergence insufficiency 08/09/2014   Neck pain 03/05/2013   Capsular glaucoma with pseudoexfoliation of lens, left eye, moderate stage 12/22/2012   Right temporal headache 06/30/2012    Family history of coronary artery disease 09/24/2011   S/P TKR (total knee replacement) 09/24/2011   Tachycardia 08/10/2011   OA (osteoarthritis) of knee 07/04/2011    Orientation RESPIRATION BLADDER Height & Weight     Self, Time, Situation, Place  Normal Continent Weight: 101 lb (45.8 kg) Height:  5' 3.5" (161.3 cm)  BEHAVIORAL SYMPTOMS/MOOD NEUROLOGICAL BOWEL NUTRITION STATUS      Continent Diet (regular)  AMBULATORY STATUS COMMUNICATION OF NEEDS Skin   Limited Assist Verbally Other (Comment) (surgical incision only)                       Personal Care Assistance Level of Assistance  Dressing, Bathing Bathing Assistance: Limited assistance   Dressing Assistance: Limited assistance     Functional Limitations Info  Sight, Hearing, Speech Sight Info: Adequate Hearing Info: Adequate Speech Info: Adequate    SPECIAL CARE FACTORS FREQUENCY  PT (By licensed PT), OT (By licensed OT)     PT Frequency: 5x/wk OT Frequency: 5x/wk            Contractures Contractures Info: Not present    Additional Factors Info  Code Status, Allergies, Psychotropic Code Status Info: DNR Allergies Info: Lisinopril, Mold Extract (Trichophyton), Molds & Smuts, Codeine, Oxycodone Psychotropic Info: see MAR         Current Medications (04/04/2022):  This is the current hospital active medication list Current Facility-Administered Medications  Medication Dose Route Frequency Provider Last Rate Last Admin   acetaminophen (TYLENOL) tablet 325-650 mg  325-650 mg Oral Q6H PRN Aggie Moats M, PA-C       acetaminophen (TYLENOL) tablet 500  mg  500 mg Oral Q6H Aggie Moats M, PA-C   500 mg at 04/04/22 1159   alum & mag hydroxide-simeth (MAALOX/MYLANTA) 200-200-20 MG/5ML suspension 30 mL  30 mL Oral Q4H PRN Gawne, Meghan M, PA-C       bisacodyl (DULCOLAX) suppository 10 mg  10 mg Rectal Daily PRN Gawne, Meghan M, PA-C       diphenhydrAMINE (BENADRYL) 12.5 MG/5ML elixir 12.5-25 mg  12.5-25 mg  Oral Q4H PRN Gawne, Meghan M, PA-C       docusate sodium (COLACE) capsule 100 mg  100 mg Oral BID Gawne, Meghan M, PA-C   100 mg at 04/04/22 0806   enoxaparin (LOVENOX) injection 30 mg  30 mg Subcutaneous Q24H Gawne, Meghan M, PA-C   30 mg at 04/04/22 0805   HYDROcodone-acetaminophen (NORCO) 7.5-325 MG per tablet 1-2 tablet  1-2 tablet Oral Q4H PRN Aggie Moats M, PA-C       HYDROcodone-acetaminophen (NORCO/VICODIN) 5-325 MG per tablet 1-2 tablet  1-2 tablet Oral Q4H PRN Britt Bottom, PA-C   2 tablet at 04/04/22 [id number]   magnesium citrate solution 1 Bottle  1 Bottle Oral Once PRN Gawne, Meghan M, PA-C       menthol-cetylpyridinium (CEPACOL) lozenge 3 mg  1 lozenge Oral PRN Gawne, Meghan M, PA-C       Or   phenol (CHLORASEPTIC) mouth spray 1 spray  1 spray Mouth/Throat PRN Gawne, Meghan M, PA-C       methocarbamol (ROBAXIN) tablet 500 mg  500 mg Oral Q6H PRN Toy Baker, MD       Or   methocarbamol (ROBAXIN) 500 mg in dextrose 5 % 50 mL IVPB  500 mg Intravenous Q6H PRN Doutova, Anastassia, MD       metoCLOPramide (REGLAN) tablet 5-10 mg  5-10 mg Oral Q8H PRN Gawne, Meghan M, PA-C       Or   metoCLOPramide (REGLAN) injection 5-10 mg  5-10 mg Intravenous Q8H PRN Gawne, Meghan M, PA-C       mirtazapine (REMERON) tablet 7.5 mg  7.5 mg Oral QHS Doutova, Anastassia, MD   7.5 mg at 04/03/22 2247   morphine (PF) 2 MG/ML injection 0.5-1 mg  0.5-1 mg Intravenous Q2H PRN Gawne, Meghan M, PA-C       multivitamin with minerals tablet 1 tablet  1 tablet Oral Daily Aggie Moats M, PA-C   1 tablet at 04/04/22 0805   ondansetron (ZOFRAN) tablet 4 mg  4 mg Oral Q6H PRN Gawne, Meghan M, PA-C       Or   ondansetron (ZOFRAN) injection 4 mg  4 mg Intravenous Q6H PRN Gawne, Meghan M, PA-C       pantoprazole (PROTONIX) EC tablet 40 mg  40 mg Oral QAC breakfast Debbe Odea, MD   40 mg at 04/04/22 0805   polyethylene glycol (MIRALAX / GLYCOLAX) packet 17 g  17 g Oral Daily Adhikari, Amrit, MD   17 g at  04/04/22 1017   rivastigmine (EXELON) capsule 1.5 mg  1.5 mg Oral Daily Rizwan, Eunice Blase, MD   1.5 mg at 04/04/22 [id number]   rivastigmine (EXELON) capsule 3 mg  3 mg Oral QHS Rizwan, Eunice Blase, MD   3 mg at 04/03/22 2247   rosuvastatin (CRESTOR) tablet 10 mg  10 mg Oral Daily Doutova, Nyoka Lint, MD   10 mg at 04/04/22 0805   senna-docusate (Senokot-S) tablet 1 tablet  1 tablet Oral BID Shelly Coss, MD   1 tablet at 04/04/22 1034  Discharge Medications: Please see discharge summary for a list of discharge medications.  Relevant Imaging Results:  Relevant Lab Results:   Additional Information SS# [id number]  Lennart Pall, LCSW

## 2022-04-04 NOTE — Plan of Care (Signed)
  Problem: Education: Goal: Knowledge of General Education information will improve Description: Including pain rating scale, medication(s)/side effects and non-pharmacologic comfort measures Outcome: Progressing   Problem: Pain Managment: Goal: General experience of comfort will improve Outcome: Progressing   Problem: Safety: Goal: Ability to remain free from injury will improve Outcome: Progressing   Problem: Education: Goal: Knowledge of the prescribed therapeutic regimen will improve Outcome: Progressing

## 2022-04-04 NOTE — Progress Notes (Signed)
Primary Cardiology:  [place]  Subjective:  Denies SSCP, palpitations or Dyspnea Post op day 1 hip   Objective:  Vitals:   04/03/22 2227 04/04/22 0157 04/04/22 0551 04/04/22 1003  BP: 126/63 120/61 (!) 147/60 (!) 115/58  Pulse: 79 79 83 88  Resp: 17 17 17 16  $ Temp: 98.3 F (36.8 C) 98.1 F (36.7 C) 98.5 F (36.9 C) 98.3 F (36.8 C)  TempSrc: Oral Oral Oral   SpO2: 98% 98% 100% 92%  Weight:      Height:        Intake/Output from previous day:  Intake/Output Summary (Last 24 hours) at 04/04/2022 1024 Last data filed at 04/04/2022 1001 Gross per 24 hour  Intake 1860 ml  Output 1600 ml  Net 260 ml    Physical Exam:  Sitting in chair good pain control Lungs clear S1/S2 SEM/AR  murmur  Abdomen benign Dressing dry over anterior left hip No edema Good pedal pulses   Lab Results: Basic Metabolic Panel: Recent Labs    04/02/22 1650 04/04/22 0808  NA 144 138  K 3.7 3.7  CL 110 105  CO2 26 26  GLUCOSE 86 121*  BUN 25* 20  CREATININE 0.60 0.68  CALCIUM 8.5* 8.5*  MG 1.9  --   PHOS 3.2  --    Liver Function Tests: Recent Labs    04/02/22 1650  AST 25  ALT 20  ALKPHOS 58  BILITOT 0.6  PROT 6.2*  ALBUMIN 3.6   No results for input(s): "LIPASE", "AMYLASE" in the last 72 hours. CBC: Recent Labs    04/02/22 1650 04/04/22 0808  WBC 5.5 9.2  NEUTROABS 4.1  --   HGB 12.2 12.3  HCT 38.9 38.3  MCV 97.7 95.5  PLT 116* 144*   Cardiac Enzymes: Recent Labs    04/02/22 1650  CKTOTAL 89    Thyroid Function Tests: Recent Labs    04/03/22 0330  TSH 0.281*     Imaging: DG HIP UNILAT WITH PELVIS 2-3 VIEWS LEFT  Result Date: 04/03/2022 CLINICAL DATA:  [id number] Hip fracture ([place]) [id number] EXAM: DG HIP (WITH OR WITHOUT PELVIS) 2-3V LEFT COMPARISON:  04/02/2022 FINDINGS: Postsurgical changes of left total hip arthroplasty. Normal alignment. No evidence of immediate hardware complication. IMPRESSION: Postsurgical changes of left total hip arthroplasty. No  evidence of immediate hardware complication. Normal alignment. Electronically Signed   By: Maurine Simmering M.D.   On: 04/03/2022 15:02   DG C-Arm 1-60 Min-No Report  Result Date: 04/03/2022 Fluoroscopy was utilized by the requesting physician.  No radiographic interpretation.   DG C-Arm 1-60 Min-No Report  Result Date: 04/03/2022 Fluoroscopy was utilized by the requesting physician.  No radiographic interpretation.   ECHOCARDIOGRAM COMPLETE  Result Date: 04/03/2022    ECHOCARDIOGRAM REPORT   Patient Name:   Janet Wilson Date of Exam: 04/03/2022 Medical Rec #:  [id number]                   Height:       63.5 in Accession #:    [id number]                  Weight:       105.0 lb Date of Birth:  1934/04/11                   BSA:          1.479 m Patient Age:    88 years  BP:           159/68 mmHg Patient Gender: F                           HR:           80 bpm. Exam Location:  Inpatient Procedure: 2D Echo, 3D Echo, Cardiac Doppler and Color Doppler STAT ECHO REPORT CONTAINS CRITICAL RESULT Indications:    Z01.818 Encounter for other preprocedural examination  History:        Patient has prior history of Echocardiogram examinations, most                 recent 01/12/2020. CAD, Abnormal ECG, Aortic Valve Disease;                 Arrythmias:A TACH and LBBB. Ascending aortic aneurysm. AI.  Sonographer:    Roseanna Rainbow RDCS Referring Phys: [place]  Sonographer Comments: Technically difficult study due to poor echo windows. Patient with hip fracture. Could not turn. IMPRESSIONS  1. Aortic dilatation noted. There is severe dilatation of the ascending aorta, measuring 60 mm.  2. Aortic regurgitation mechanism appears to be from annular dilation. There is a largely central jet. The aortic valve is tricuspid. There is moderate calcification of the aortic valve. Aortic valve regurgitation is moderate to severe. Mild aortic valve stenosis. Aortic valve mean gradient measures 7.0  mmHg.  3. Left ventricular ejection fraction, by estimation, is 55%. The left ventricle has low normal function. The left ventricle has no regional wall motion abnormalities. There is moderate asymmetric left ventricular hypertrophy of the septal segment. Left  ventricular diastolic parameters are indeterminate. Elevated left atrial pressure.  4. Right ventricular systolic function is normal. The right ventricular size is normal. There is mildly elevated pulmonary artery systolic pressure. The estimated right ventricular systolic pressure is [id number] mmHg.  5. 2D MVA 2.89 cm2, continuity equation defered in the setting of AI; suspect mild mitral stenosis. The mitral valve is degenerative. No evidence of mitral valve regurgitation. The mean mitral valve gradient is 4.0 mmHg.  6. The inferior vena cava is dilated in size with <50% respiratory variability, suggesting right atrial pressure of 15 mmHg. Comparison(s): Prior images reviewed side by side. Aortic maximum dimension has increased from prior; recommend clinical correlation with recent CT aorta protocol as oblique echo measurements can lead to over-estimation. Conclusion(s)/Recommendation(s): Reaching out to inpatient cardioogy team. FINDINGS  Left Ventricle: Left ventricular ejection fraction, by estimation, is 55%. The left ventricle has low normal function. The left ventricle has no regional wall motion abnormalities. The left ventricular internal cavity size was normal in size. There is moderate asymmetric left ventricular hypertrophy of the septal segment. Left ventricular diastolic function could not be evaluated due to mitral annular calcification (moderate or greater). Left ventricular diastolic parameters are indeterminate. Elevated left atrial pressure. Right Ventricle: The right ventricular size is normal. No increase in right ventricular wall thickness. Right ventricular systolic function is normal. There is mildly elevated pulmonary artery systolic  pressure. The tricuspid regurgitant velocity is 2.39  m/s, and with an assumed right atrial pressure of 15 mmHg, the estimated right ventricular systolic pressure is [id number] mmHg. Left Atrium: Left atrial size was normal in size. Right Atrium: Right atrial size was normal in size. Pericardium: Trivial pericardial effusion is present. The pericardial effusion is posterior to the left ventricle. Mitral Valve: 2D MVA 2.89 cm2, continuity equation defered in the setting of AI;  suspect mild mitral stenosis. The mitral valve is degenerative in appearance. No evidence of mitral valve regurgitation. MV peak gradient, 8.6 mmHg. The mean mitral valve gradient is 4.0 mmHg with average heart rate of 82 bpm. Tricuspid Valve: The tricuspid valve is not well visualized. Tricuspid valve regurgitation is trivial. No evidence of tricuspid stenosis. Aortic Valve: Aortic regurgitation mechanism appears to be from annular dilation. There is a largely central jet. The aortic valve is tricuspid. There is moderate calcification of the aortic valve. Aortic valve regurgitation is moderate to severe. Aortic  regurgitation PHT measures 529 msec. Mild aortic stenosis is present. Aortic valve mean gradient measures 7.0 mmHg. Aortic valve peak gradient measures 12.5 mmHg. Aortic valve area, by VTI measures 3.12 cm. Pulmonic Valve: The pulmonic valve was not well visualized. Pulmonic valve regurgitation is mild to moderate. No evidence of pulmonic stenosis. Aorta: Aortic dilatation noted. There is severe dilatation of the ascending aorta, measuring 60 mm. Venous: The inferior vena cava is dilated in size with less than 50% respiratory variability, suggesting right atrial pressure of 15 mmHg. IAS/Shunts: The interatrial septum was not well visualized.  LEFT VENTRICLE PLAX 2D LVIDd:         3.70 cm      Diastology LVIDs:         2.70 cm      LV e' medial:    4.46 cm/s LV PW:         1.40 cm      LV E/e' medial:  21.9 LV IVS:        1.30 cm      LV e'  lateral:   9.03 cm/s LVOT diam:     2.40 cm      LV E/e' lateral: 10.8 LV SV:         105 LV SV Index:   71 LVOT Area:     4.52 cm  LV Volumes (MOD) LV vol d, MOD A2C: 96.4 ml LV vol d, MOD A4C: 138.0 ml LV vol s, MOD A2C: 39.2 ml LV vol s, MOD A4C: 72.7 ml LV SV MOD A2C:     57.2 ml LV SV MOD A4C:     138.0 ml LV SV MOD BP:      58.5 ml RIGHT VENTRICLE            IVC RV S prime:     5.66 cm/s  IVC diam: 2.20 cm TAPSE (M-mode): 1.3 cm LEFT ATRIUM             Index        RIGHT ATRIUM          Index LA diam:        2.90 cm 1.96 cm/m   RA Area:     4.90 cm LA Vol (A2C):   34.1 ml 23.05 ml/m  RA Volume:   6.28 ml  4.25 ml/m LA Vol (A4C):   32.1 ml 21.70 ml/m LA Biplane Vol: 36.1 ml 24.41 ml/m  AORTIC VALVE                     PULMONIC VALVE AV Area (Vmax):    3.14 cm      PR End Diast Vel: 1.90 msec AV Area (Vmean):   3.01 cm AV Area (VTI):     3.12 cm AV Vmax:           177.00 cm/s AV Vmean:          124.000  cm/s AV VTI:            0.335 m AV Peak Grad:      12.5 mmHg AV Mean Grad:      7.0 mmHg LVOT Vmax:         123.00 cm/s LVOT Vmean:        82.600 cm/s LVOT VTI:          0.231 m LVOT/AV VTI ratio: 0.69 AI PHT:            529 msec  AORTA Ao Root diam: 3.30 cm Ao Asc diam:  5.90 cm MITRAL VALVE                TRICUSPID VALVE MV Area (PHT): 5.54 cm     TR Peak grad:   22.8 mmHg MV Area VTI:   3.21 cm     TR Vmax:        239.00 cm/s MV Peak grad:  8.6 mmHg MV Mean grad:  4.0 mmHg     SHUNTS MV Vmax:       1.47 m/s     Systemic VTI:  0.23 m MV Vmean:      93.6 cm/s    Systemic Diam: 2.40 cm MV Decel Time: 137 msec MV E velocity: 97.50 cm/s MV A velocity: 126.00 cm/s MV E/A ratio:  0.77 Rudean Haskell MD Electronically signed by Rudean Haskell MD Signature Date/Time: 04/03/2022/10:20:28 AM    Final    CT CERVICAL SPINE WO CONTRAST  Result Date: 04/02/2022 CLINICAL DATA:  [place] from chair EXAM: CT CERVICAL SPINE WITHOUT CONTRAST TECHNIQUE: Multidetector CT imaging of the cervical spine was  performed without intravenous contrast. Multiplanar CT image reconstructions were also generated. RADIATION DOSE REDUCTION: This exam was performed according to the departmental dose-optimization program which includes automated exposure control, adjustment of the mA and/or kV according to patient size and/or use of iterative reconstruction technique. COMPARISON:  02/23/2021 FINDINGS: Alignment: Stable mild degenerative anterolisthesis of C7 on T1. Otherwise alignment is grossly anatomic. Skull base and vertebrae: No acute fracture. No primary bone lesion or focal pathologic process. Soft tissues and spinal canal: No prevertebral fluid or swelling. No visible canal hematoma. Disc levels: Prior ACDF spanning C4 through C7. There is mild diffuse spondylosis and facet hypertrophy throughout the nonsurgical levels. No change since prior exam. Upper chest: Airway is patent. Lung apices are clear. Known thyroid goiter, previously evaluated by ultrasound and biopsy. Other: Reconstructed images demonstrate no additional findings. IMPRESSION: 1. No acute cervical spine fracture. 2. Stable postsurgical and degenerative changes of the cervical spine. Electronically Signed   By: Randa Ngo M.D.   On: 04/02/2022 19:28   CT HEAD WO CONTRAST  Result Date: 04/02/2022 CLINICAL DATA:  [place] from chair EXAM: CT HEAD WITHOUT CONTRAST TECHNIQUE: Contiguous axial images were obtained from the base of the skull through the vertex without intravenous contrast. RADIATION DOSE REDUCTION: This exam was performed according to the departmental dose-optimization program which includes automated exposure control, adjustment of the mA and/or kV according to patient size and/or use of iterative reconstruction technique. COMPARISON:  02/23/2021 FINDINGS: Brain: Chronic small vessel ischemic changes within the white matter and bilateral basal ganglia. No acute infarct or hemorrhage. Lateral ventricles and midline structures are unremarkable.  No acute extra-axial fluid collections. No mass effect. Vascular: No hyperdense vessel or unexpected calcification. Skull: Normal. Negative for fracture or focal lesion. Sinuses/Orbits: Chronic left sphenoid sinus disease. The remaining paranasal sinuses are clear. Other: None. IMPRESSION: 1.  No acute intracranial process. Electronically Signed   By: Randa Ngo M.D.   On: 04/02/2022 19:25   DG Chest Port 1 View  Result Date: 04/02/2022 CLINICAL DATA:  Pain, left hip fracture, preoperative evaluation EXAM: PORTABLE CHEST 1 VIEW COMPARISON:  11/05/2021, 10/04/2021 FINDINGS: Single frontal view of the chest demonstrates stable enlargement of the cardiac silhouette. Dilated ectatic thoracic aorta compatible with known ascending thoracic aortic aneurysm. Increased density in the right paratracheal region consistent with vascular shadow and thyroid. No acute airspace disease, effusion, or pneumothorax. No acute bony abnormality. IMPRESSION: 1. Continued dilation and ectasia of the thoracic aorta consistent with known thoracic aortic aneurysm. 2. No acute airspace disease. Electronically Signed   By: Randa Ngo M.D.   On: 04/02/2022 18:54   DG Hip Unilat With Pelvis 2-3 Views Left  Result Date: 04/02/2022 CLINICAL DATA:  Pain after fall EXAM: DG HIP (WITH OR WITHOUT PELVIS) 3V LEFT COMPARISON:  None Available. FINDINGS: Portable x-rays. Left femoral neck fracture with displacement, foreshortening and angulation. Underlying osteopenia. Concentric joint space loss of both hips. Degenerative changes. Foley catheter. IMPRESSION: Displaced subcapital femoral neck fracture. Displaced, impacted femoral neck fracture on the left Electronically Signed   By: Jill Side M.D.   On: 04/02/2022 17:23    Cardiac Studies:  ECG: SR chronic LBBB   Telemetry:  NSR   Medications:    acetaminophen  500 mg Oral Q6H   docusate sodium  100 mg Oral BID   enoxaparin (LOVENOX) injection  30 mg Subcutaneous Q24H    mirtazapine  7.5 mg Oral QHS   multivitamin with minerals  1 tablet Oral Daily   pantoprazole  40 mg Oral QAC breakfast   polyethylene glycol  17 g Oral Daily   rivastigmine  1.5 mg Oral Daily   rivastigmine  3 mg Oral QHS   rosuvastatin  10 mg Oral Daily   senna-docusate  1 tablet Oral BID      methocarbamol (ROBAXIN) IV      Assessment/Plan:   Postoperative :;  cardiac status stable no CHF, chest pain or arrhythmia PT/OT per routine post op THR care LBBB:  stable chronic  AV dx:  moderate AR and mild AS follow up echo per [facility] Thoracic aneurysm:  5.7 cm follows with [facility] non operable given need for extension to arch and no cardiocirculatory arrest at this advanced age   Will sign off ok to d/c from cardiology perspective   Jenkins Rouge 04/04/2022, 10:24 AM

## 2022-04-04 NOTE — Progress Notes (Signed)
    Subjective: Patient reports pain as mild.  Tolerating diet.  Urinating.   No CP, SOB.  Has mobilized some OOB with PT this morning.  Objective:   VITALS:   Vitals:   04/03/22 2227 04/04/22 0157 04/04/22 0551 04/04/22 1003  BP: 126/63 120/61 (!) 147/60 (!) 115/58  Pulse: 79 79 83 88  Resp: 17 17 17 16  $ Temp: 98.3 F (36.8 C) 98.1 F (36.7 C) 98.5 F (36.9 C) 98.3 F (36.8 C)  TempSrc: Oral Oral Oral   SpO2: 98% 98% 100% 92%  Weight:      Height:          Latest Ref Rng & Units 04/04/2022    8:08 AM 04/02/2022    4:50 PM 11/05/2021    9:27 PM  CBC  WBC 4.0 - 10.5 K/uL 9.2  5.5  6.3   Hemoglobin 12.0 - 15.0 g/dL 12.3  12.2  13.8   Hematocrit 36.0 - 46.0 % 38.3  38.9  41.5   Platelets 150 - 400 K/uL 144  116  156       Latest Ref Rng & Units 04/04/2022    8:08 AM 04/02/2022    4:50 PM 12/29/2021   12:55 PM  BMP  Glucose 70 - 99 mg/dL 121  86    BUN 8 - 23 mg/dL 20  25    Creatinine 0.44 - 1.00 mg/dL 0.68  0.60  0.80   Sodium 135 - 145 mmol/L 138  144    Potassium 3.5 - 5.1 mmol/L 3.7  3.7    Chloride 98 - 111 mmol/L 105  110    CO2 22 - 32 mmol/L 26  26    Calcium 8.9 - 10.3 mg/dL 8.5  8.5     Intake/Output      02/20 0701 02/21 0700 02/21 0701 02/22 0700   P.O. 320 240   I.V. (mL/kg) 1000 (21.8)    IV Piggyback 300    Total Intake(mL/kg) 1620 (35.4) 240 (5.2)   Urine (mL/kg/hr) 1150 (1) 200 (1.4)   Blood 250    Total Output 1400 200   Net +220 +40           Physical Exam: General: NAD.  Sitting up in bedside chair, calm, at times slightly pleasantly confused Resp: No increased wob Cardio: regular rate and rhythm ABD soft Neurologically intact MSK Neurovascularly intact Sensation intact distally Intact pulses distally Dorsiflexion/Plantar flexion intact Incision: dressing C/D/I   Assessment: 1 Day Post-Op  S/P Procedure(s) (LRB): TOTAL HIP ARTHROPLASTY ANTERIOR APPROACH (Left) by Dr. Ernesta Amble. Percell Miller on 04/03/22  Principal Problem:    Closed left hip fracture ([place]) Active Problems:   Ectopic atrial tachycardia   Pure hypercholesterolemia   Dementia (HCC)   Aortic aneurysm (HCC)    Plan:  Advance diet Up with therapy Incentive Spirometry Elevate and Apply ice  Weightbearing: WBAT LLE Insicional and dressing care: Dressings left intact until follow-up and Reinforce dressings as needed Orthopedic device(s): None Showering: Keep dressing dry VTE prophylaxis:  Lovenox 21m daily   while inpatient, can switch to ASA 872mbid x 30 days upon d/c , SCDs, ambulation Pain control: minimize narcotics as able due to age and dementia Follow - up plan: 2 weeks post op Contact information:  TiEdmonia LynchD, MeAggie MoatsA-C  Dispo:  TBD based on PT evaluation.      MeBritt BottomPA-C Office [phone]/21/2024, 10:06 AM

## 2022-04-04 NOTE — Progress Notes (Addendum)
PROGRESS NOTE  Janet Wilson  [id number] DOB: 03/09/1934 DOA: 04/02/2022 PCP: Virgie Dad, MD   Brief Narrative: Patient is a 88 year old female with history of abdominal aortic aneurysm, mild AS, migraine, temporal arteritis, left bundle branch block who presented with fall and development of severe pain in the left hip and groin.  Found to have left total hip fracture.  Orthopedics consulted, status post total hip arthroplasty on 2/20.  PT/OT evaluation pending  Assessment & Plan:  Principal Problem:   Closed left hip fracture (HCC) Active Problems:   Ectopic atrial tachycardia   Pure hypercholesterolemia   Dementia (HCC)   Aortic aneurysm (HCC)  Close left hip fracture/fall: Presenting with left hip pain after falling.  Status post left THA on 2/20.  On Lovenox for DVT prophylaxis.  Continue pain management, supportive care, bowel regimen PT/OT seeing today.  History of dementia: On Exelon.  She remains alert and oriented  History of AAA:-Size of 7 cm.  Not a surgical candidate.  Follow-up.  Follow up periodically  GERD: Continue PPI  Abnormal TSH: Low TSH, patient is asymptomatic.  Pending free T4 and free T3    Nutrition Problem: Moderate Malnutrition Etiology: chronic illness    DVT prophylaxis:enoxaparin (LOVENOX) injection 30 mg Start: 04/04/22 0800 SCDs Start: 04/03/22 1654 Place TED hose Start: 04/03/22 1654 SCDs Start: 04/02/22 2127     Code Status: DNR  Family Communication: None at the bedside  Patient status:Inpatient  Patient is from :Home  Anticipated discharge to:SNF versus home health  Estimated DC date:1-2 days   Consultants: Ortho  Procedures:THA  Antimicrobials:  Anti-infectives (From admission, onward)    Start     Dose/Rate Route Frequency Ordered Stop   04/03/22 1930  ceFAZolin (ANCEF) IVPB 1 g/50 mL premix        1 g 100 mL/hr over 30 Minutes Intravenous Every 6 hours 04/03/22 1653 04/04/22 0130   04/03/22  1145  ceFAZolin (ANCEF) IVPB 2g/100 mL premix        2 g 200 mL/hr over 30 Minutes Intravenous On call to O.R. 04/03/22 1134 04/03/22 1340       Subjective: Patient seen and examined at bedside today.  Hemodynamically stable.  Sitting on the bed.  Pain well-controlled.  No new complaints.  Alert and oriented  Objective: Vitals:   04/03/22 1655 04/03/22 2227 04/04/22 0157 04/04/22 0551  BP: 134/67 126/63 120/61 (!) 147/60  Pulse: 78 79 79 83  Resp: 16 17 17 17  $ Temp: 97.6 F (36.4 C) 98.3 F (36.8 C) 98.1 F (36.7 C) 98.5 F (36.9 C)  TempSrc: Oral Oral Oral Oral  SpO2: 98% 98% 98% 100%  Weight:      Height:        Intake/Output Summary (Last 24 hours) at 04/04/2022 0932 Last data filed at 04/04/2022 [id number] Gross per 24 hour  Intake 1860 ml  Output 1400 ml  Net 460 ml   Filed Weights   04/02/22 1606 04/03/22 1148  Weight: 47.6 kg 45.8 kg    Examination:  General exam: Overall comfortable, not in distress, pleasant elderly female HEENT: PERRL Respiratory system:  no wheezes or crackles  Cardiovascular system: S1 & S2 heard, RRR.  Gastrointestinal system: Abdomen is nondistended, soft and nontender. Central nervous system: Alert and oriented Extremities: No edema, no clubbing ,no cyanosis, surgical wound on the left hip Skin: No rashes, no ulcers,no icterus     Data Reviewed: I have personally reviewed following labs and imaging studies  CBC: Recent Labs  Lab 04/02/22 1650 04/04/22 0808  WBC 5.5 9.2  NEUTROABS 4.1  --   HGB 12.2 12.3  HCT 38.9 38.3  MCV 97.7 95.5  PLT 116* [id number]*   Basic Metabolic Panel: Recent Labs  Lab 04/02/22 1650 04/04/22 0808  NA 144 138  K 3.7 3.7  CL 110 105  CO2 26 26  GLUCOSE 86 121*  BUN 25* 20  CREATININE 0.60 0.68  CALCIUM 8.5* 8.5*  MG 1.9  --   PHOS 3.2  --      Recent Results (from the past 240 hour(s))  Surgical PCR screen     Status: None   Collection Time: 04/02/22 11:29 PM   Specimen: Nasal Mucosa; Nasal  Swab  Result Value Ref Range Status   MRSA, PCR NEGATIVE NEGATIVE Final   Staphylococcus aureus NEGATIVE NEGATIVE Final    Comment: (NOTE) The Xpert SA Assay (FDA approved for NASAL specimens in patients 70 years of age and older), is one component of a comprehensive surveillance program. It is not intended to diagnose infection nor to guide or monitor treatment. Performed at [facility], [place] [address], [place], [place] [address]      Radiology Studies: DG HIP UNILAT WITH PELVIS 2-3 VIEWS LEFT  Result Date: 04/03/2022 CLINICAL DATA:  [id number] Hip fracture ([place]) [id number] EXAM: DG HIP (WITH OR WITHOUT PELVIS) 2-3V LEFT COMPARISON:  04/02/2022 FINDINGS: Postsurgical changes of left total hip arthroplasty. Normal alignment. No evidence of immediate hardware complication. IMPRESSION: Postsurgical changes of left total hip arthroplasty. No evidence of immediate hardware complication. Normal alignment. Electronically Signed   By: Maurine Simmering M.D.   On: 04/03/2022 15:02   DG C-Arm 1-60 Min-No Report  Result Date: 04/03/2022 Fluoroscopy was utilized by the requesting physician.  No radiographic interpretation.   DG C-Arm 1-60 Min-No Report  Result Date: 04/03/2022 Fluoroscopy was utilized by the requesting physician.  No radiographic interpretation.   ECHOCARDIOGRAM COMPLETE  Result Date: 04/03/2022    ECHOCARDIOGRAM REPORT   Patient Name:   Janet Wilson Date of Exam: 04/03/2022 Medical Rec #:  [id number]                   Height:       63.5 in Accession #:    [id number]                  Weight:       105.0 lb Date of Birth:  1934/06/25                   BSA:          1.479 m Patient Age:    72 years                    BP:           159/68 mmHg Patient Gender: F                           HR:           80 bpm. Exam Location:  Inpatient Procedure: 2D Echo, 3D Echo, Cardiac Doppler and Color Doppler STAT ECHO REPORT CONTAINS CRITICAL RESULT Indications:    Z01.818  Encounter for other preprocedural examination  History:        Patient has prior history of Echocardiogram examinations, most  recent 01/12/2020. CAD, Abnormal ECG, Aortic Valve Disease;                 Arrythmias:A TACH and LBBB. Ascending aortic aneurysm. AI.  Sonographer:    Roseanna Rainbow RDCS Referring Phys: [place]  Sonographer Comments: Technically difficult study due to poor echo windows. Patient with hip fracture. Could not turn. IMPRESSIONS  1. Aortic dilatation noted. There is severe dilatation of the ascending aorta, measuring 60 mm.  2. Aortic regurgitation mechanism appears to be from annular dilation. There is a largely central jet. The aortic valve is tricuspid. There is moderate calcification of the aortic valve. Aortic valve regurgitation is moderate to severe. Mild aortic valve stenosis. Aortic valve mean gradient measures 7.0 mmHg.  3. Left ventricular ejection fraction, by estimation, is 55%. The left ventricle has low normal function. The left ventricle has no regional wall motion abnormalities. There is moderate asymmetric left ventricular hypertrophy of the septal segment. Left  ventricular diastolic parameters are indeterminate. Elevated left atrial pressure.  4. Right ventricular systolic function is normal. The right ventricular size is normal. There is mildly elevated pulmonary artery systolic pressure. The estimated right ventricular systolic pressure is [id number] mmHg.  5. 2D MVA 2.89 cm2, continuity equation defered in the setting of AI; suspect mild mitral stenosis. The mitral valve is degenerative. No evidence of mitral valve regurgitation. The mean mitral valve gradient is 4.0 mmHg.  6. The inferior vena cava is dilated in size with <50% respiratory variability, suggesting right atrial pressure of 15 mmHg. Comparison(s): Prior images reviewed side by side. Aortic maximum dimension has increased from prior; recommend clinical correlation with recent CT aorta  protocol as oblique echo measurements can lead to over-estimation. Conclusion(s)/Recommendation(s): Reaching out to inpatient cardioogy team. FINDINGS  Left Ventricle: Left ventricular ejection fraction, by estimation, is 55%. The left ventricle has low normal function. The left ventricle has no regional wall motion abnormalities. The left ventricular internal cavity size was normal in size. There is moderate asymmetric left ventricular hypertrophy of the septal segment. Left ventricular diastolic function could not be evaluated due to mitral annular calcification (moderate or greater). Left ventricular diastolic parameters are indeterminate. Elevated left atrial pressure. Right Ventricle: The right ventricular size is normal. No increase in right ventricular wall thickness. Right ventricular systolic function is normal. There is mildly elevated pulmonary artery systolic pressure. The tricuspid regurgitant velocity is 2.39  m/s, and with an assumed right atrial pressure of 15 mmHg, the estimated right ventricular systolic pressure is [id number] mmHg. Left Atrium: Left atrial size was normal in size. Right Atrium: Right atrial size was normal in size. Pericardium: Trivial pericardial effusion is present. The pericardial effusion is posterior to the left ventricle. Mitral Valve: 2D MVA 2.89 cm2, continuity equation defered in the setting of AI; suspect mild mitral stenosis. The mitral valve is degenerative in appearance. No evidence of mitral valve regurgitation. MV peak gradient, 8.6 mmHg. The mean mitral valve gradient is 4.0 mmHg with average heart rate of 82 bpm. Tricuspid Valve: The tricuspid valve is not well visualized. Tricuspid valve regurgitation is trivial. No evidence of tricuspid stenosis. Aortic Valve: Aortic regurgitation mechanism appears to be from annular dilation. There is a largely central jet. The aortic valve is tricuspid. There is moderate calcification of the aortic valve. Aortic valve regurgitation  is moderate to severe. Aortic  regurgitation PHT measures 529 msec. Mild aortic stenosis is present. Aortic valve mean gradient measures 7.0 mmHg. Aortic valve peak gradient measures 12.5  mmHg. Aortic valve area, by VTI measures 3.12 cm. Pulmonic Valve: The pulmonic valve was not well visualized. Pulmonic valve regurgitation is mild to moderate. No evidence of pulmonic stenosis. Aorta: Aortic dilatation noted. There is severe dilatation of the ascending aorta, measuring 60 mm. Venous: The inferior vena cava is dilated in size with less than 50% respiratory variability, suggesting right atrial pressure of 15 mmHg. IAS/Shunts: The interatrial septum was not well visualized.  LEFT VENTRICLE PLAX 2D LVIDd:         3.70 cm      Diastology LVIDs:         2.70 cm      LV e' medial:    4.46 cm/s LV PW:         1.40 cm      LV E/e' medial:  21.9 LV IVS:        1.30 cm      LV e' lateral:   9.03 cm/s LVOT diam:     2.40 cm      LV E/e' lateral: 10.8 LV SV:         105 LV SV Index:   71 LVOT Area:     4.52 cm  LV Volumes (MOD) LV vol d, MOD A2C: 96.4 ml LV vol d, MOD A4C: 138.0 ml LV vol s, MOD A2C: 39.2 ml LV vol s, MOD A4C: 72.7 ml LV SV MOD A2C:     57.2 ml LV SV MOD A4C:     138.0 ml LV SV MOD BP:      58.5 ml RIGHT VENTRICLE            IVC RV S prime:     5.66 cm/s  IVC diam: 2.20 cm TAPSE (M-mode): 1.3 cm LEFT ATRIUM             Index        RIGHT ATRIUM          Index LA diam:        2.90 cm 1.96 cm/m   RA Area:     4.90 cm LA Vol (A2C):   34.1 ml 23.05 ml/m  RA Volume:   6.28 ml  4.25 ml/m LA Vol (A4C):   32.1 ml 21.70 ml/m LA Biplane Vol: 36.1 ml 24.41 ml/m  AORTIC VALVE                     PULMONIC VALVE AV Area (Vmax):    3.14 cm      PR End Diast Vel: 1.90 msec AV Area (Vmean):   3.01 cm AV Area (VTI):     3.12 cm AV Vmax:           177.00 cm/s AV Vmean:          124.000 cm/s AV VTI:            0.335 m AV Peak Grad:      12.5 mmHg AV Mean Grad:      7.0 mmHg LVOT Vmax:         123.00 cm/s LVOT Vmean:         82.600 cm/s LVOT VTI:          0.231 m LVOT/AV VTI ratio: 0.69 AI PHT:            529 msec  AORTA Ao Root diam: 3.30 cm Ao Asc diam:  5.90 cm MITRAL VALVE  TRICUSPID VALVE MV Area (PHT): 5.54 cm     TR Peak grad:   22.8 mmHg MV Area VTI:   3.21 cm     TR Vmax:        239.00 cm/s MV Peak grad:  8.6 mmHg MV Mean grad:  4.0 mmHg     SHUNTS MV Vmax:       1.47 m/s     Systemic VTI:  0.23 m MV Vmean:      93.6 cm/s    Systemic Diam: 2.40 cm MV Decel Time: 137 msec MV E velocity: 97.50 cm/s MV A velocity: 126.00 cm/s MV E/A ratio:  0.77 Rudean Haskell MD Electronically signed by Rudean Haskell MD Signature Date/Time: 04/03/2022/10:20:28 AM    Final    CT CERVICAL SPINE WO CONTRAST  Result Date: 04/02/2022 CLINICAL DATA:  [place] from chair EXAM: CT CERVICAL SPINE WITHOUT CONTRAST TECHNIQUE: Multidetector CT imaging of the cervical spine was performed without intravenous contrast. Multiplanar CT image reconstructions were also generated. RADIATION DOSE REDUCTION: This exam was performed according to the departmental dose-optimization program which includes automated exposure control, adjustment of the mA and/or kV according to patient size and/or use of iterative reconstruction technique. COMPARISON:  02/23/2021 FINDINGS: Alignment: Stable mild degenerative anterolisthesis of C7 on T1. Otherwise alignment is grossly anatomic. Skull base and vertebrae: No acute fracture. No primary bone lesion or focal pathologic process. Soft tissues and spinal canal: No prevertebral fluid or swelling. No visible canal hematoma. Disc levels: Prior ACDF spanning C4 through C7. There is mild diffuse spondylosis and facet hypertrophy throughout the nonsurgical levels. No change since prior exam. Upper chest: Airway is patent. Lung apices are clear. Known thyroid goiter, previously evaluated by ultrasound and biopsy. Other: Reconstructed images demonstrate no additional findings. IMPRESSION: 1. No acute cervical  spine fracture. 2. Stable postsurgical and degenerative changes of the cervical spine. Electronically Signed   By: Randa Ngo M.D.   On: 04/02/2022 19:28   CT HEAD WO CONTRAST  Result Date: 04/02/2022 CLINICAL DATA:  [place] from chair EXAM: CT HEAD WITHOUT CONTRAST TECHNIQUE: Contiguous axial images were obtained from the base of the skull through the vertex without intravenous contrast. RADIATION DOSE REDUCTION: This exam was performed according to the departmental dose-optimization program which includes automated exposure control, adjustment of the mA and/or kV according to patient size and/or use of iterative reconstruction technique. COMPARISON:  02/23/2021 FINDINGS: Brain: Chronic small vessel ischemic changes within the white matter and bilateral basal ganglia. No acute infarct or hemorrhage. Lateral ventricles and midline structures are unremarkable. No acute extra-axial fluid collections. No mass effect. Vascular: No hyperdense vessel or unexpected calcification. Skull: Normal. Negative for fracture or focal lesion. Sinuses/Orbits: Chronic left sphenoid sinus disease. The remaining paranasal sinuses are clear. Other: None. IMPRESSION: 1. No acute intracranial process. Electronically Signed   By: Randa Ngo M.D.   On: 04/02/2022 19:25   DG Chest Port 1 View  Result Date: 04/02/2022 CLINICAL DATA:  Pain, left hip fracture, preoperative evaluation EXAM: PORTABLE CHEST 1 VIEW COMPARISON:  11/05/2021, 10/04/2021 FINDINGS: Single frontal view of the chest demonstrates stable enlargement of the cardiac silhouette. Dilated ectatic thoracic aorta compatible with known ascending thoracic aortic aneurysm. Increased density in the right paratracheal region consistent with vascular shadow and thyroid. No acute airspace disease, effusion, or pneumothorax. No acute bony abnormality. IMPRESSION: 1. Continued dilation and ectasia of the thoracic aorta consistent with known thoracic aortic aneurysm. 2. No acute  airspace disease. Electronically Signed  By: Randa Ngo M.D.   On: 04/02/2022 18:54   DG Hip Unilat With Pelvis 2-3 Views Left  Result Date: 04/02/2022 CLINICAL DATA:  Pain after fall EXAM: DG HIP (WITH OR WITHOUT PELVIS) 3V LEFT COMPARISON:  None Available. FINDINGS: Portable x-rays. Left femoral neck fracture with displacement, foreshortening and angulation. Underlying osteopenia. Concentric joint space loss of both hips. Degenerative changes. Foley catheter. IMPRESSION: Displaced subcapital femoral neck fracture. Displaced, impacted femoral neck fracture on the left Electronically Signed   By: Jill Side M.D.   On: 04/02/2022 17:23    Scheduled Meds:  acetaminophen  500 mg Oral Q6H   docusate sodium  100 mg Oral BID   enoxaparin (LOVENOX) injection  30 mg Subcutaneous Q24H   mirtazapine  7.5 mg Oral QHS   multivitamin with minerals  1 tablet Oral Daily   pantoprazole  40 mg Oral QAC breakfast   rivastigmine  1.5 mg Oral Daily   rivastigmine  3 mg Oral QHS   rosuvastatin  10 mg Oral Daily   Continuous Infusions:  methocarbamol (ROBAXIN) IV       LOS: 2 days   Shelly Coss, MD [facility] P2/21/2024, 9:32 AM

## 2022-04-04 NOTE — Plan of Care (Signed)
  Problem: Activity: Goal: Risk for activity intolerance will decrease Outcome: Progressing   Problem: Nutrition: Goal: Adequate nutrition will be maintained Outcome: Progressing   Problem: Pain Managment: Goal: General experience of comfort will improve Outcome: Progressing   Problem: Safety: Goal: Ability to remain free from injury will improve Outcome: Progressing   

## 2022-04-04 NOTE — Evaluation (Addendum)
Physical Therapy Evaluation Patient Details Name: Janet Wilson MRN: [id number] DOB: 1934-07-18 Today's Date: 04/04/2022  History of Present Illness  88 yo female admitted with L hip fx. Pt is s/p L THA-DA 04/03/22. Hx of AAA, CAD, LBBB, RLS, R TKA 2013, cervical fusion, dementia  Clinical Impression  On eval, pt was Min guard-Min A for mobility. She walked ~150 feet with a RW. Pain appears controlled. Pt participated well. Plan is for d/c to skilled rehab section of [facility] ([facility] from [facility]) per patient. Will continue to follow and progress activity as tolerated.      Recommendations for follow up therapy are one component of a multi-disciplinary discharge planning process, led by the attending physician.  Recommendations may be updated based on patient status, additional functional criteria and insurance authorization.  Follow Up Recommendations Skilled nursing-short term rehab (<3 hours/day) Can patient physically be transported by private vehicle: Yes    Assistance Recommended at Discharge Frequent or constant Supervision/Assistance  Patient can return home with the following  A little help with walking and/or transfers;A little help with bathing/dressing/bathroom;Assistance with cooking/housework;Assist for transportation;Help with stairs or ramp for entrance    Equipment Recommendations Rolling walker (2 wheels)  Recommendations for Other Services       Functional Status Assessment Patient has had a recent decline in their functional status and demonstrates the ability to make significant improvements in function in a reasonable and predictable amount of time.     Precautions / Restrictions Precautions Precautions: Fall Restrictions Weight Bearing Restrictions: No Other Position/Activity Restrictions: wbat      Mobility  Bed Mobility Overal bed mobility: Needs Assistance Bed Mobility: Supine to Sit     Supine to sit: Min guard;Min Assist; HOB  elevated     General bed mobility comments: Min guard for safety mostly. Small amount of assist intiially but then pt completed rest of task on her own. Increased time. Cues provided.    Transfers Overall transfer level: Needs assistance Equipment used: Rolling walker (2 wheels) Transfers: Sit to/from Stand Sit to Stand: Min guard, From elevated surface           General transfer comment: Min guard A for safety. Cues for safety, technique, hand placement.    Ambulation/Gait Ambulation/Gait assistance: Min guard Gait Distance (Feet): 150 Feet Assistive device: Rolling walker (2 wheels) Gait Pattern/deviations: Step-through pattern, Decreased stride length       General Gait Details: Min guard A for safety. No LOB with RW use. Pt denied dizziness. Tolerated distance well.  Stairs            Wheelchair Mobility    Modified Rankin (Stroke Patients Only)       Balance Overall balance assessment: Needs assistance         Standing balance support: Bilateral upper extremity supported, Reliant on assistive device for balance, During functional activity Standing balance-Leahy Scale: Poor                               Pertinent Vitals/Pain Pain Assessment Pain Assessment: Faces Faces Pain Scale: Hurts little more Pain Location: L hip Pain Descriptors / Indicators: Discomfort, Sore Pain Intervention(s): Monitored during session, Ice applied, Repositioned    Home Living Family/patient expects to be discharged to:: Private residence Living Arrangements: Spouse/significant other   Type of Home: Independent living facility Home Access: Level entry       Home Layout: One level  Prior Function Prior Level of Function : Independent/Modified Independent                     Hand Dominance        Extremity/Trunk Assessment   Upper Extremity Assessment Upper Extremity Assessment: Overall WFL for tasks assessed    Lower  Extremity Assessment Lower Extremity Assessment: Generalized weakness    Cervical / Trunk Assessment Cervical / Trunk Assessment: Normal  Communication   Communication: No difficulties  Cognition Arousal/Alertness: Awake/alert Behavior During Therapy: WFL for tasks assessed/performed Overall Cognitive Status: Within Functional Limits for tasks assessed. Has hx of dementia.                                          General Comments      Exercises     Assessment/Plan    PT Assessment Patient needs continued PT services  PT Problem List Decreased strength;Decreased range of motion;Decreased mobility;Decreased activity tolerance;Decreased balance;Decreased knowledge of use of DME;Pain       PT Treatment Interventions DME instruction;Gait training;Therapeutic exercise;Balance training;Functional mobility training;Therapeutic activities;Patient/family education;Stair training    PT Goals (Current goals can be found in the Care Plan section)  Acute Rehab PT Goals Patient Stated Goal: regain independence PT Goal Formulation: With patient Time For Goal Achievement: 04/18/22 Potential to Achieve Goals: Good    Frequency 7X/week     Co-evaluation               AM-PAC PT "6 Clicks" Mobility  Outcome Measure Help needed turning from your back to your side while in a flat bed without using bedrails?: A Little Help needed moving from lying on your back to sitting on the side of a flat bed without using bedrails?: A Little Help needed moving to and from a bed to a chair (including a wheelchair)?: A Little Help needed standing up from a chair using your arms (e.g., wheelchair or bedside chair)?: A Little Help needed to walk in hospital room?: A Little Help needed climbing 3-5 steps with a railing? : A Little 6 Click Score: 18    End of Session Equipment Utilized During Treatment: Gait belt Activity Tolerance: Patient tolerated treatment well Patient left:  in chair;with call bell/phone within reach;with chair alarm set   PT Visit Diagnosis: Pain;Other abnormalities of gait and mobility (R26.89) Pain - Right/Left: Left Pain - part of body: Hip    Time: [id number] PT Time Calculation (min) (ACUTE ONLY): 22 min   Charges:   PT Evaluation $PT Eval Low Complexity: [place], PT Acute Rehabilitation  Office: [phone]

## 2022-04-04 NOTE — TOC Progression Note (Signed)
Transition of Care [facility]) - Progression Note    Patient Details  Name: Janet Wilson MRN: [id number] Date of Birth: 04/01/1934  Transition of Care [facility]) CM/SW Contact  Lennart Pall, LCSW Phone Number: 04/04/2022, 2:41 PM  Clinical Narrative:     Pt and family aware MD anticipating pt will be medically ready for dc tomorrow to SNF at [facility].  Have confirmed with admissions that facility is ready to admit.  Pt will need PTAR transport.  Expected Discharge Plan: [place] Barriers to Discharge: Continued Medical Work up  Expected Discharge Plan and Services In-house Referral: Clinical Social Work   Post Acute Care Choice: [place] Living arrangements for the past 2 months: [place] (@ [facility] x 7 yrs)                 DME Arranged: N/A DME Agency: NA                   Social Determinants of Health (SDOH) Interventions SDOH Screenings   Food Insecurity: No Food Insecurity (04/02/2022)  Housing: Low Risk  (04/02/2022)  Transportation Needs: No Transportation Needs (04/02/2022)  Utilities: Not At Risk (04/02/2022)  Alcohol Screen: Low Risk  (03/19/2018)  Depression (PHQ2-9): Low Risk  (01/16/2022)  Tobacco Use: Low Risk  (04/04/2022)    Readmission Risk Interventions    04/03/2022    3:01 PM  Readmission Risk Prevention Plan  Transportation Screening Complete  PCP or Specialist Appt within 5-7 Days Complete  Home Care Screening Complete  Medication Review (RN CM) Complete

## 2022-04-05 DIAGNOSIS — S72002A Fracture of unspecified part of neck of left femur, initial encounter for closed fracture: Secondary | ICD-10-CM | POA: Diagnosis not present

## 2022-04-05 DIAGNOSIS — I359 Nonrheumatic aortic valve disorder, unspecified: Secondary | ICD-10-CM | POA: Diagnosis not present

## 2022-04-05 DIAGNOSIS — I712 Thoracic aortic aneurysm, without rupture, unspecified: Secondary | ICD-10-CM

## 2022-04-05 MED ORDER — POLYETHYLENE GLYCOL 3350 17 G PO PACK: 17.0000 g | PACK | Freq: Every day | ORAL | 0 refills | Status: AC

## 2022-04-05 MED ORDER — SENNOSIDES-DOCUSATE SODIUM 8.6-50 MG PO TABS: 1.0000 | ORAL_TABLET | Freq: Two times a day (BID) | ORAL | Status: DC

## 2022-04-05 MED ORDER — HYDROCODONE-ACETAMINOPHEN 5-325 MG PO TABS: 1.0000 | ORAL_TABLET | Freq: Four times a day (QID) | ORAL | 0 refills | Status: DC | PRN

## 2022-04-05 MED ORDER — ASPIRIN 81 MG PO TBEC: 81.0000 mg | DELAYED_RELEASE_TABLET | Freq: Two times a day (BID) | ORAL | 0 refills | Status: DC

## 2022-04-05 NOTE — TOC Transition Note (Addendum)
Transition of Care [facility]) - CM/SW Discharge Note   Patient Details  Name: Janet Wilson MRN: [id number] Date of Birth: [birth year]  Transition [facility]) CM/SW Contact:  Servando Snare, LCSW Phone Number: 04/05/2022, 11:41 AM   Clinical Narrative:   Patient to return to [facility] SN[facility], via PT[facility]. Daughter [name], notified. PTAR has been called.   RN # for report: [phone]    Final next level of care: Skilled Nursing Facility Barriers to Discharge: No Barriers Identified   Patient Goals and CMS Choice   Choice offered to / list presented to : NA  Discharge Placement                Patient chooses bed at: [facility] Patient to be transferred to facility by: [place] Name of family member notified: Lisa,daughter Patient and family notified of of transfer: 04/05/22  Discharge Plan and Services Additional resources added to the After Visit Summary for   In-house Referral: Clinical Social Work   Post Acute Care Choice: [place]          DME Arranged: N/A DME Agency: NA       HH Arranged: NA [place] Agency: NA        Social Determinants of Health (SDOH) Interventions SDOH Screenings   Food Insecurity: No Food Insecurity (04/02/2022)  Housing: Low Risk  (04/02/2022)  Transportation Needs: No Transportation Needs (04/02/2022)  Utilities: Not At Risk (04/02/2022)  Alcohol Screen: Low Risk  (03/19/2018)  Depression (PHQ2-9): Low Risk  (01/16/2022)  Tobacco Use: Low Risk  (04/04/2022)     Readmission Risk Interventions    04/03/2022    3:01 PM  Readmission Risk Prevention Plan  Transportation Screening Complete  PCP or Specialist Appt within 5-7 Days Complete  Home Care Screening Complete  Medication Review (RN CM) Complete

## 2022-04-05 NOTE — Progress Notes (Signed)
Primary Cardiology:  [place]  Subjective:  Denies SSCP, palpitations or Dyspnea Post op day 2 hip  Hct stable Awaiting d/c to [facility]  Objective:  Vitals:   04/04/22 0551 04/04/22 1003 04/04/22 2053 04/05/22 0522  BP: (!) 147/60 (!) 115/58 (!) 128/58 (!) 151/67  Pulse: 83 88 94 88  Resp: 17 16  15  $ Temp: 98.5 F (36.9 C) 98.3 F (36.8 C) 98.1 F (36.7 C) 97.9 F (36.6 C)  TempSrc: Oral  Oral Oral  SpO2: 100% 92% 93% 97%  Weight:      Height:        Intake/Output from previous day:  Intake/Output Summary (Last 24 hours) at 04/05/2022 0854 Last data filed at 04/05/2022 0600 Gross per 24 hour  Intake 480 ml  Output 200 ml  Net 280 ml    Physical Exam:  Sitting in chair good pain control Lungs clear S1/S2 SEM/AR  murmur  Abdomen benign Dressing dry over anterior left hip No edema Good pedal pulses   Lab Results: Basic Metabolic Panel: Recent Labs    04/02/22 1650 04/04/22 0808  NA 144 138  K 3.7 3.7  CL 110 105  CO2 26 26  GLUCOSE 86 121*  BUN 25* 20  CREATININE 0.60 0.68  CALCIUM 8.5* 8.5*  MG 1.9  --   PHOS 3.2  --    Liver Function Tests: Recent Labs    04/02/22 1650  AST 25  ALT 20  ALKPHOS 58  BILITOT 0.6  PROT 6.2*  ALBUMIN 3.6   No results for input(s): "LIPASE", "AMYLASE" in the last 72 hours. CBC: Recent Labs    04/02/22 1650 04/04/22 0808  WBC 5.5 9.2  NEUTROABS 4.1  --   HGB 12.2 12.3  HCT 38.9 38.3  MCV 97.7 95.5  PLT 116* 144*   Cardiac Enzymes: Recent Labs    04/02/22 1650  CKTOTAL 89    Thyroid Function Tests: Recent Labs    04/03/22 0330  TSH 0.281*     Imaging: DG HIP UNILAT WITH PELVIS 2-3 VIEWS LEFT  Result Date: 04/03/2022 CLINICAL DATA:  [id number] Hip fracture ([place]) [id number] EXAM: DG HIP (WITH OR WITHOUT PELVIS) 2-3V LEFT COMPARISON:  04/02/2022 FINDINGS: Postsurgical changes of left total hip arthroplasty. Normal alignment. No evidence of immediate hardware complication. IMPRESSION:  Postsurgical changes of left total hip arthroplasty. No evidence of immediate hardware complication. Normal alignment. Electronically Signed   By: Maurine Simmering M.D.   On: 04/03/2022 15:02   DG C-Arm 1-60 Min-No Report  Result Date: 04/03/2022 Fluoroscopy was utilized by the requesting physician.  No radiographic interpretation.   DG C-Arm 1-60 Min-No Report  Result Date: 04/03/2022 Fluoroscopy was utilized by the requesting physician.  No radiographic interpretation.   ECHOCARDIOGRAM COMPLETE  Result Date: 04/03/2022    ECHOCARDIOGRAM REPORT   Patient Name:   Janet Wilson Date of Exam: 04/03/2022 Medical Rec #:  [id number]                   Height:       63.5 in Accession #:    [id number]                  Weight:       105.0 lb Date of Birth:  1934-10-24                   BSA:          1.479 m Patient Age:  88 years                    BP:           159/68 mmHg Patient Gender: F                           HR:           80 bpm. Exam Location:  Inpatient Procedure: 2D Echo, 3D Echo, Cardiac Doppler and Color Doppler STAT ECHO REPORT CONTAINS CRITICAL RESULT Indications:    Z01.818 Encounter for other preprocedural examination  History:        Patient has prior history of Echocardiogram examinations, most                 recent 01/12/2020. CAD, Abnormal ECG, Aortic Valve Disease;                 Arrythmias:A TACH and LBBB. Ascending aortic aneurysm. AI.  Sonographer:    Roseanna Rainbow RDCS Referring Phys: [place]  Sonographer Comments: Technically difficult study due to poor echo windows. Patient with hip fracture. Could not turn. IMPRESSIONS  1. Aortic dilatation noted. There is severe dilatation of the ascending aorta, measuring 60 mm.  2. Aortic regurgitation mechanism appears to be from annular dilation. There is a largely central jet. The aortic valve is tricuspid. There is moderate calcification of the aortic valve. Aortic valve regurgitation is moderate to severe. Mild aortic  valve stenosis. Aortic valve mean gradient measures 7.0 mmHg.  3. Left ventricular ejection fraction, by estimation, is 55%. The left ventricle has low normal function. The left ventricle has no regional wall motion abnormalities. There is moderate asymmetric left ventricular hypertrophy of the septal segment. Left  ventricular diastolic parameters are indeterminate. Elevated left atrial pressure.  4. Right ventricular systolic function is normal. The right ventricular size is normal. There is mildly elevated pulmonary artery systolic pressure. The estimated right ventricular systolic pressure is [id number] mmHg.  5. 2D MVA 2.89 cm2, continuity equation defered in the setting of AI; suspect mild mitral stenosis. The mitral valve is degenerative. No evidence of mitral valve regurgitation. The mean mitral valve gradient is 4.0 mmHg.  6. The inferior vena cava is dilated in size with <50% respiratory variability, suggesting right atrial pressure of 15 mmHg. Comparison(s): Prior images reviewed side by side. Aortic maximum dimension has increased from prior; recommend clinical correlation with recent CT aorta protocol as oblique echo measurements can lead to over-estimation. Conclusion(s)/Recommendation(s): Reaching out to inpatient cardioogy team. FINDINGS  Left Ventricle: Left ventricular ejection fraction, by estimation, is 55%. The left ventricle has low normal function. The left ventricle has no regional wall motion abnormalities. The left ventricular internal cavity size was normal in size. There is moderate asymmetric left ventricular hypertrophy of the septal segment. Left ventricular diastolic function could not be evaluated due to mitral annular calcification (moderate or greater). Left ventricular diastolic parameters are indeterminate. Elevated left atrial pressure. Right Ventricle: The right ventricular size is normal. No increase in right ventricular wall thickness. Right ventricular systolic function is normal.  There is mildly elevated pulmonary artery systolic pressure. The tricuspid regurgitant velocity is 2.39  m/s, and with an assumed right atrial pressure of 15 mmHg, the estimated right ventricular systolic pressure is [id number] mmHg. Left Atrium: Left atrial size was normal in size. Right Atrium: Right atrial size was normal in size. Pericardium: Trivial pericardial effusion is present. The pericardial  effusion is posterior to the left ventricle. Mitral Valve: 2D MVA 2.89 cm2, continuity equation defered in the setting of AI; suspect mild mitral stenosis. The mitral valve is degenerative in appearance. No evidence of mitral valve regurgitation. MV peak gradient, 8.6 mmHg. The mean mitral valve gradient is 4.0 mmHg with average heart rate of 82 bpm. Tricuspid Valve: The tricuspid valve is not well visualized. Tricuspid valve regurgitation is trivial. No evidence of tricuspid stenosis. Aortic Valve: Aortic regurgitation mechanism appears to be from annular dilation. There is a largely central jet. The aortic valve is tricuspid. There is moderate calcification of the aortic valve. Aortic valve regurgitation is moderate to severe. Aortic  regurgitation PHT measures 529 msec. Mild aortic stenosis is present. Aortic valve mean gradient measures 7.0 mmHg. Aortic valve peak gradient measures 12.5 mmHg. Aortic valve area, by VTI measures 3.12 cm. Pulmonic Valve: The pulmonic valve was not well visualized. Pulmonic valve regurgitation is mild to moderate. No evidence of pulmonic stenosis. Aorta: Aortic dilatation noted. There is severe dilatation of the ascending aorta, measuring 60 mm. Venous: The inferior vena cava is dilated in size with less than 50% respiratory variability, suggesting right atrial pressure of 15 mmHg. IAS/Shunts: The interatrial septum was not well visualized.  LEFT VENTRICLE PLAX 2D LVIDd:         3.70 cm      Diastology LVIDs:         2.70 cm      LV e' medial:    4.46 cm/s LV PW:         1.40 cm      LV  E/e' medial:  21.9 LV IVS:        1.30 cm      LV e' lateral:   9.03 cm/s LVOT diam:     2.40 cm      LV E/e' lateral: 10.8 LV SV:         105 LV SV Index:   71 LVOT Area:     4.52 cm  LV Volumes (MOD) LV vol d, MOD A2C: 96.4 ml LV vol d, MOD A4C: 138.0 ml LV vol s, MOD A2C: 39.2 ml LV vol s, MOD A4C: 72.7 ml LV SV MOD A2C:     57.2 ml LV SV MOD A4C:     138.0 ml LV SV MOD BP:      58.5 ml RIGHT VENTRICLE            IVC RV S prime:     5.66 cm/s  IVC diam: 2.20 cm TAPSE (M-mode): 1.3 cm LEFT ATRIUM             Index        RIGHT ATRIUM          Index LA diam:        2.90 cm 1.96 cm/m   RA Area:     4.90 cm LA Vol (A2C):   34.1 ml 23.05 ml/m  RA Volume:   6.28 ml  4.25 ml/m LA Vol (A4C):   32.1 ml 21.70 ml/m LA Biplane Vol: 36.1 ml 24.41 ml/m  AORTIC VALVE                     PULMONIC VALVE AV Area (Vmax):    3.14 cm      PR End Diast Vel: 1.90 msec AV Area (Vmean):   3.01 cm AV Area (VTI):     3.12 cm AV Vmax:  177.00 cm/s AV Vmean:          124.000 cm/s AV VTI:            0.335 m AV Peak Grad:      12.5 mmHg AV Mean Grad:      7.0 mmHg LVOT Vmax:         123.00 cm/s LVOT Vmean:        82.600 cm/s LVOT VTI:          0.231 m LVOT/AV VTI ratio: 0.69 AI PHT:            529 msec  AORTA Ao Root diam: 3.30 cm Ao Asc diam:  5.90 cm MITRAL VALVE                TRICUSPID VALVE MV Area (PHT): 5.54 cm     TR Peak grad:   22.8 mmHg MV Area VTI:   3.21 cm     TR Vmax:        239.00 cm/s MV Peak grad:  8.6 mmHg MV Mean grad:  4.0 mmHg     SHUNTS MV Vmax:       1.47 m/s     Systemic VTI:  0.23 m MV Vmean:      93.6 cm/s    Systemic Diam: 2.40 cm MV Decel Time: 137 msec MV E velocity: 97.50 cm/s MV A velocity: 126.00 cm/s MV E/A ratio:  0.77 Rudean Haskell MD Electronically signed by Rudean Haskell MD Signature Date/Time: 04/03/2022/10:20:28 AM    Final     Cardiac Studies:  ECG: SR chronic LBBB   Telemetry:  NSR   Medications:    docusate sodium  100 mg Oral BID   enoxaparin (LOVENOX)  injection  30 mg Subcutaneous Q24H   mirtazapine  7.5 mg Oral QHS   multivitamin with minerals  1 tablet Oral Daily   pantoprazole  40 mg Oral QAC breakfast   polyethylene glycol  17 g Oral Daily   rivastigmine  1.5 mg Oral Daily   rivastigmine  3 mg Oral QHS   rosuvastatin  10 mg Oral Daily   senna-docusate  1 tablet Oral BID      methocarbamol (ROBAXIN) IV      Assessment/Plan:   Postoperative :;  cardiac status stable no CHF, chest pain or arrhythmia PT/OT per routine post op THR care LBBB:  stable chronic  AV dx:  moderate AR and mild AS follow up echo per [facility] Thoracic aneurysm:  5.7 cm follows with [facility] non operable given need for extension to arch and no cardiocirculatory arrest at this advanced age   Will sign off ok to d/c from cardiology perspective   Jenkins Rouge 04/05/2022, 8:54 AM

## 2022-04-05 NOTE — Progress Notes (Signed)
Subjective: Patient reports pain as mild.  Tolerating diet.  Urinating.   No CP, SOB.  Has mobilized some OOB with PT. Has bed available to go to SNF portion of [facility] today.  Objective:   VITALS:   Vitals:   04/04/22 0551 04/04/22 1003 04/04/22 2053 04/05/22 0522  BP: (!) 147/60 (!) 115/58 (!) 128/58 (!) 151/67  Pulse: 83 88 94 88  Resp: 17 16  15  $ Temp: 98.5 F (36.9 C) 98.3 F (36.8 C) 98.1 F (36.7 C) 97.9 F (36.6 C)  TempSrc: Oral  Oral Oral  SpO2: 100% 92% 93% 97%  Weight:      Height:          Latest Ref Rng & Units 04/04/2022    8:08 AM 04/02/2022    4:50 PM 11/05/2021    9:27 PM  CBC  WBC 4.0 - 10.5 K/uL 9.2  5.5  6.3   Hemoglobin 12.0 - 15.0 g/dL 12.3  12.2  13.8   Hematocrit 36.0 - 46.0 % 38.3  38.9  41.5   Platelets 150 - 400 K/uL 144  116  156       Latest Ref Rng & Units 04/04/2022    8:08 AM 04/02/2022    4:50 PM 12/29/2021   12:55 PM  BMP  Glucose 70 - 99 mg/dL 121  86    BUN 8 - 23 mg/dL 20  25    Creatinine 0.44 - 1.00 mg/dL 0.68  0.60  0.80   Sodium 135 - 145 mmol/L 138  144    Potassium 3.5 - 5.1 mmol/L 3.7  3.7    Chloride 98 - 111 mmol/L 105  110    CO2 22 - 32 mmol/L 26  26    Calcium 8.9 - 10.3 mg/dL 8.5  8.5     Intake/Output      02/21 0701 02/22 0700 02/22 0701 02/23 0700   P.O. 720    I.V. (mL/kg)     IV Piggyback     Total Intake(mL/kg) 720 (15.7)    Urine (mL/kg/hr) 200 (0.2)    Stool 0    Blood     Total Output 200    Net +520         Urine Occurrence 3 x    Stool Occurrence 0 x       Physical Exam: General: NAD.  Laying in bed, calm Resp: No increased wob Cardio: regular rate and rhythm ABD soft Neurologically intact MSK Neurovascularly intact Sensation intact distally Intact pulses distally Dorsiflexion/Plantar flexion intact Incision: dressing C/D/I   Assessment: 2 Days Post-Op  S/P Procedure(s) (LRB): TOTAL HIP ARTHROPLASTY ANTERIOR APPROACH (Left) by Dr. Ernesta Amble. Percell Miller on  04/03/22  Principal Problem:   Closed left hip fracture (HCC) Active Problems:   Ectopic atrial tachycardia   Pure hypercholesterolemia   Dementia (HCC)   Aortic aneurysm (HCC)   Aortic valve disease    Plan:  Advance diet Up with therapy Incentive Spirometry Elevate and Apply ice  Weightbearing: WBAT LLE Insicional and dressing care: Dressings left intact until follow-up and Reinforce dressings as needed Orthopedic device(s): None Showering: Keep dressing dry VTE prophylaxis:  Lovenox 33m daily   while inpatient, can switch to ASA 819mbid x 30 days upon d/c , SCDs, ambulation Pain control: minimize narcotics as able due to age and dementia Follow - up plan: 2 weeks post op Contact information:  TiEdmonia LynchD, MeAggie MoatsA-C  Dispo:  SNF portion  of [facility] today. Will print and sign DVT and pain medicine scripts. Ready for d/c from ortho standpoint.      Britt Bottom, PA-C Office [phone] 04/05/2022, 7:42 AM

## 2022-04-05 NOTE — Plan of Care (Signed)
Pt ready to be transported via PT[facility] to [place] [facility]. Report called to facility.

## 2022-04-05 NOTE — Discharge Summary (Signed)
Physician Discharge Summary  Sylar Jansen [place] [id number] DOB: 12/29/1934 DOA: 04/02/2022  PCP: Virgie Dad, MD  Admit date: 04/02/2022 Discharge date: 04/05/2022  Admitted From: SNF Disposition:  SNF  Discharge Condition:Stable CODE STATUS:DNR Diet recommendation: Heart Healthy   Brief/Interim Summary: Patient is a 88 year old female with history of abdominal aortic aneurysm, mild AS, migraine, temporal arteritis, left bundle branch block who presented with fall and development of severe pain in the left hip and groin.  Found to have left total hip fracture.  Orthopedics consulted, status post total hip arthroplasty on 2/20.  PT/OT evaluation done recommending SNF.  She is medically stable for discharge to SNF today.  She will follow-up with orthopedics in 2 weeks.  Started on aspirin for DVT prophylaxis.  Following problems were addressed during the hospitalization:  Close left hip fracture/fall: Presenting with left hip pain after falling.  Status post left THA on 2/20. PT/OT evaluation done recommending SNF.    She will follow-up with orthopedics in 2 weeks.  Started on aspirin for DVT prophylaxis.  History of dementia: On Exelon.  She remains alert and oriented   History of AAA:-Size of 5.7 cm.  Not a surgical candidate.   Follow up with cardiothoracic surgery recommended   GERD: Continue PPI   Abnormal TSH: Low TSH, patient is asymptomatic.  Normal free T4   Thrombocytopenia: Mild.  Continue to monitor as  outpatient    Discharge Diagnoses:  Principal Problem:   Closed left hip fracture ([place]) Active Problems:   Ectopic atrial tachycardia   Pure hypercholesterolemia   Dementia (HCC)   Aortic aneurysm (HCC)   Aortic valve disease    Discharge Instructions  Discharge Instructions     Diet - low sodium heart healthy   Complete by: As directed    Discharge instructions   Complete by: As directed    1)Please take prescribed medications as  instructed.Check CBC in a week 2)Follow up with orthopedics as an outpatient in 2 weeks.  Name and number of the provider group has been addressed 3)Follow up with cardiothoracic surgery as an outpatient for follow up of  thoracic aortic aneurysm   Increase activity slowly   Complete by: As directed       Allergies as of 04/05/2022       Reactions   Lisinopril Other (See Comments), Cough   Very low BP/"very tired" also   Mold Extract [trichophyton] Other (See Comments)   Headaches and migraines   Molds & Smuts Other (See Comments)   Headaches and migraines   Codeine Nausea Only   Oxycodone Nausea Only        Medication List     TAKE these medications    aspirin EC 81 MG tablet Take 1 tablet (81 mg total) by mouth 2 (two) times daily. To prevent blood clots for 30 days after surgery.   brimonidine 0.2 % ophthalmic solution Commonly known as: ALPHAGAN Place 1 drop into both eyes 2 (two) times daily.   CALCIUM PLUS VITAMIN D PO Take 1 tablet by mouth 2 (two) times a week.   Co Q 10 100 MG Caps Take 100 mg by mouth 2 (two) times a week.   Fish Oil 1200 MG Caps Take 1,200 mg by mouth 2 (two) times a week.   HYDROcodone-acetaminophen 5-325 MG tablet Commonly known as: Norco Take 1-2 tablets by mouth every 6 (six) hours as needed for severe pain. MAXIMUM TOTAL ACETAMINOPHEN DOSE IS 4000 MG PER DAY   mirtazapine  7.5 MG tablet Commonly known as: REMERON TAKE 1 TABLET BY MOUTH AT BEDTIME.   omeprazole 40 MG capsule Commonly known as: PRILOSEC TAKE 1 CAPSULE (40 MG TOTAL) BY MOUTH DAILY. What changed: when to take this   polyethylene glycol 17 g packet Commonly known as: MIRALAX / GLYCOLAX Take 17 g by mouth daily. Start taking on: April 06, 2022   rivastigmine 3 MG capsule Commonly known as: EXELON TAKE 1 CAPSULE (3 MG TOTAL) BY MOUTH AT BEDTIME. What changed: Another medication with the same name was changed. Make sure you understand how and when to take  each.   rivastigmine 1.5 MG capsule Commonly known as: EXELON TAKE 1 CAPSULE EACH MORNING. What changed:  how much to take how to take this when to take this   rosuvastatin 10 MG tablet Commonly known as: CRESTOR TAKE 1 TABLET BY MOUTH EVERY DAY   senna-docusate 8.6-50 MG tablet Commonly known as: Senokot-S Take 1 tablet by mouth 2 (two) times daily.   Systane Ultra PF 0.4-0.3 % Soln Generic drug: Polyethyl Glyc-Propyl Glyc PF Apply 1 drop to eye 3 (three) times daily as needed (for dryness).        Contact information for follow-up providers     Renette Butters, MD. Schedule an appointment as soon as possible for a visit in 2 week(s).   Specialty: Orthopedic Surgery Contact information: [address] [place] [address] [phone]              Contact information for after-discharge care     Destination     [facility] .   Service: Skilled Nursing Contact information: [place] [address] [phone]                    Allergies  Allergen Reactions   Lisinopril Other (See Comments) and Cough    Very low BP/"very tired" also   Mold Extract [Trichophyton] Other (See Comments)    Headaches and migraines   Molds & Smuts Other (See Comments)    Headaches and migraines   Codeine Nausea Only   Oxycodone Nausea Only    Consultations: Orthopedics   Procedures/Studies: DG HIP UNILAT WITH PELVIS 2-3 VIEWS LEFT  Result Date: 04/03/2022 CLINICAL DATA:  [id number] Hip fracture ([place]) [id number] EXAM: DG HIP (WITH OR WITHOUT PELVIS) 2-3V LEFT COMPARISON:  04/02/2022 FINDINGS: Postsurgical changes of left total hip arthroplasty. Normal alignment. No evidence of immediate hardware complication. IMPRESSION: Postsurgical changes of left total hip arthroplasty. No evidence of immediate hardware complication. Normal alignment. Electronically Signed   By: Maurine Simmering M.D.    On: 04/03/2022 15:02   DG C-Arm 1-60 Min-No Report  Result Date: 04/03/2022 Fluoroscopy was utilized by the requesting physician.  No radiographic interpretation.   DG C-Arm 1-60 Min-No Report  Result Date: 04/03/2022 Fluoroscopy was utilized by the requesting physician.  No radiographic interpretation.   ECHOCARDIOGRAM COMPLETE  Result Date: 04/03/2022    ECHOCARDIOGRAM REPORT   Patient Name:   Janet Wilson Date of Exam: 04/03/2022 Medical Rec #:  [id number]                   Height:       63.5 in Accession #:    [id number]                  Weight:       105.0 lb Date of Birth:  Aug 24, 1934  BSA:          1.479 m Patient Age:    34 years                    BP:           159/68 mmHg Patient Gender: F                           HR:           80 bpm. Exam Location:  Inpatient Procedure: 2D Echo, 3D Echo, Cardiac Doppler and Color Doppler STAT ECHO REPORT CONTAINS CRITICAL RESULT Indications:    Z01.818 Encounter for other preprocedural examination  History:        Patient has prior history of Echocardiogram examinations, most                 recent 01/12/2020. CAD, Abnormal ECG, Aortic Valve Disease;                 Arrythmias:A TACH and LBBB. Ascending aortic aneurysm. AI.  Sonographer:    Roseanna Rainbow RDCS Referring Phys: [place]  Sonographer Comments: Technically difficult study due to poor echo windows. Patient with hip fracture. Could not turn. IMPRESSIONS  1. Aortic dilatation noted. There is severe dilatation of the ascending aorta, measuring 60 mm.  2. Aortic regurgitation mechanism appears to be from annular dilation. There is a largely central jet. The aortic valve is tricuspid. There is moderate calcification of the aortic valve. Aortic valve regurgitation is moderate to severe. Mild aortic valve stenosis. Aortic valve mean gradient measures 7.0 mmHg.  3. Left ventricular ejection fraction, by estimation, is 55%. The left ventricle has low normal  function. The left ventricle has no regional wall motion abnormalities. There is moderate asymmetric left ventricular hypertrophy of the septal segment. Left  ventricular diastolic parameters are indeterminate. Elevated left atrial pressure.  4. Right ventricular systolic function is normal. The right ventricular size is normal. There is mildly elevated pulmonary artery systolic pressure. The estimated right ventricular systolic pressure is [id number] mmHg.  5. 2D MVA 2.89 cm2, continuity equation defered in the setting of AI; suspect mild mitral stenosis. The mitral valve is degenerative. No evidence of mitral valve regurgitation. The mean mitral valve gradient is 4.0 mmHg.  6. The inferior vena cava is dilated in size with <50% respiratory variability, suggesting right atrial pressure of 15 mmHg. Comparison(s): Prior images reviewed side by side. Aortic maximum dimension has increased from prior; recommend clinical correlation with recent CT aorta protocol as oblique echo measurements can lead to over-estimation. Conclusion(s)/Recommendation(s): Reaching out to inpatient cardioogy team. FINDINGS  Left Ventricle: Left ventricular ejection fraction, by estimation, is 55%. The left ventricle has low normal function. The left ventricle has no regional wall motion abnormalities. The left ventricular internal cavity size was normal in size. There is moderate asymmetric left ventricular hypertrophy of the septal segment. Left ventricular diastolic function could not be evaluated due to mitral annular calcification (moderate or greater). Left ventricular diastolic parameters are indeterminate. Elevated left atrial pressure. Right Ventricle: The right ventricular size is normal. No increase in right ventricular wall thickness. Right ventricular systolic function is normal. There is mildly elevated pulmonary artery systolic pressure. The tricuspid regurgitant velocity is 2.39  m/s, and with an assumed right atrial pressure of 15  mmHg, the estimated right ventricular systolic pressure is [id number] mmHg. Left Atrium: Left atrial size was normal in size.  Right Atrium: Right atrial size was normal in size. Pericardium: Trivial pericardial effusion is present. The pericardial effusion is posterior to the left ventricle. Mitral Valve: 2D MVA 2.89 cm2, continuity equation defered in the setting of AI; suspect mild mitral stenosis. The mitral valve is degenerative in appearance. No evidence of mitral valve regurgitation. MV peak gradient, 8.6 mmHg. The mean mitral valve gradient is 4.0 mmHg with average heart rate of 82 bpm. Tricuspid Valve: The tricuspid valve is not well visualized. Tricuspid valve regurgitation is trivial. No evidence of tricuspid stenosis. Aortic Valve: Aortic regurgitation mechanism appears to be from annular dilation. There is a largely central jet. The aortic valve is tricuspid. There is moderate calcification of the aortic valve. Aortic valve regurgitation is moderate to severe. Aortic  regurgitation PHT measures 529 msec. Mild aortic stenosis is present. Aortic valve mean gradient measures 7.0 mmHg. Aortic valve peak gradient measures 12.5 mmHg. Aortic valve area, by VTI measures 3.12 cm. Pulmonic Valve: The pulmonic valve was not well visualized. Pulmonic valve regurgitation is mild to moderate. No evidence of pulmonic stenosis. Aorta: Aortic dilatation noted. There is severe dilatation of the ascending aorta, measuring 60 mm. Venous: The inferior vena cava is dilated in size with less than 50% respiratory variability, suggesting right atrial pressure of 15 mmHg. IAS/Shunts: The interatrial septum was not well visualized.  LEFT VENTRICLE PLAX 2D LVIDd:         3.70 cm      Diastology LVIDs:         2.70 cm      LV e' medial:    4.46 cm/s LV PW:         1.40 cm      LV E/e' medial:  21.9 LV IVS:        1.30 cm      LV e' lateral:   9.03 cm/s LVOT diam:     2.40 cm      LV E/e' lateral: 10.8 LV SV:         105 LV SV Index:   71  LVOT Area:     4.52 cm  LV Volumes (MOD) LV vol d, MOD A2C: 96.4 ml LV vol d, MOD A4C: 138.0 ml LV vol s, MOD A2C: 39.2 ml LV vol s, MOD A4C: 72.7 ml LV SV MOD A2C:     57.2 ml LV SV MOD A4C:     138.0 ml LV SV MOD BP:      58.5 ml RIGHT VENTRICLE            IVC RV S prime:     5.66 cm/s  IVC diam: 2.20 cm TAPSE (M-mode): 1.3 cm LEFT ATRIUM             Index        RIGHT ATRIUM          Index LA diam:        2.90 cm 1.96 cm/m   RA Area:     4.90 cm LA Vol (A2C):   34.1 ml 23.05 ml/m  RA Volume:   6.28 ml  4.25 ml/m LA Vol (A4C):   32.1 ml 21.70 ml/m LA Biplane Vol: 36.1 ml 24.41 ml/m  AORTIC VALVE                     PULMONIC VALVE AV Area (Vmax):    3.14 cm      PR End Diast Vel: 1.90 msec AV Area (Vmean):  3.01 cm AV Area (VTI):     3.12 cm AV Vmax:           177.00 cm/s AV Vmean:          124.000 cm/s AV VTI:            0.335 m AV Peak Grad:      12.5 mmHg AV Mean Grad:      7.0 mmHg LVOT Vmax:         123.00 cm/s LVOT Vmean:        82.600 cm/s LVOT VTI:          0.231 m LVOT/AV VTI ratio: 0.69 AI PHT:            529 msec  AORTA Ao Root diam: 3.30 cm Ao Asc diam:  5.90 cm MITRAL VALVE                TRICUSPID VALVE MV Area (PHT): 5.54 cm     TR Peak grad:   22.8 mmHg MV Area VTI:   3.21 cm     TR Vmax:        239.00 cm/s MV Peak grad:  8.6 mmHg MV Mean grad:  4.0 mmHg     SHUNTS MV Vmax:       1.47 m/s     Systemic VTI:  0.23 m MV Vmean:      93.6 cm/s    Systemic Diam: 2.40 cm MV Decel Time: 137 msec MV E velocity: 97.50 cm/s MV A velocity: 126.00 cm/s MV E/A ratio:  0.77 Rudean Haskell MD Electronically signed by Rudean Haskell MD Signature Date/Time: 04/03/2022/10:20:28 AM    Final    CT CERVICAL SPINE WO CONTRAST  Result Date: 04/02/2022 CLINICAL DATA:  [place] from chair EXAM: CT CERVICAL SPINE WITHOUT CONTRAST TECHNIQUE: Multidetector CT imaging of the cervical spine was performed without intravenous contrast. Multiplanar CT image reconstructions were also generated. RADIATION DOSE  REDUCTION: This exam was performed according to the departmental dose-optimization program which includes automated exposure control, adjustment of the mA and/or kV according to patient size and/or use of iterative reconstruction technique. COMPARISON:  02/23/2021 FINDINGS: Alignment: Stable mild degenerative anterolisthesis of C7 on T1. Otherwise alignment is grossly anatomic. Skull base and vertebrae: No acute fracture. No primary bone lesion or focal pathologic process. Soft tissues and spinal canal: No prevertebral fluid or swelling. No visible canal hematoma. Disc levels: Prior ACDF spanning C4 through C7. There is mild diffuse spondylosis and facet hypertrophy throughout the nonsurgical levels. No change since prior exam. Upper chest: Airway is patent. Lung apices are clear. Known thyroid goiter, previously evaluated by ultrasound and biopsy. Other: Reconstructed images demonstrate no additional findings. IMPRESSION: 1. No acute cervical spine fracture. 2. Stable postsurgical and degenerative changes of the cervical spine. Electronically Signed   By: Randa Ngo M.D.   On: 04/02/2022 19:28   CT HEAD WO CONTRAST  Result Date: 04/02/2022 CLINICAL DATA:  [place] from chair EXAM: CT HEAD WITHOUT CONTRAST TECHNIQUE: Contiguous axial images were obtained from the base of the skull through the vertex without intravenous contrast. RADIATION DOSE REDUCTION: This exam was performed according to the departmental dose-optimization program which includes automated exposure control, adjustment of the mA and/or kV according to patient size and/or use of iterative reconstruction technique. COMPARISON:  02/23/2021 FINDINGS: Brain: Chronic small vessel ischemic changes within the white matter and bilateral basal ganglia. No acute infarct or hemorrhage. Lateral ventricles and midline structures are unremarkable. No acute extra-axial  fluid collections. No mass effect. Vascular: No hyperdense vessel or unexpected  calcification. Skull: Normal. Negative for fracture or focal lesion. Sinuses/Orbits: Chronic left sphenoid sinus disease. The remaining paranasal sinuses are clear. Other: None. IMPRESSION: 1. No acute intracranial process. Electronically Signed   By: Randa Ngo M.D.   On: 04/02/2022 19:25   DG Chest Port 1 View  Result Date: 04/02/2022 CLINICAL DATA:  Pain, left hip fracture, preoperative evaluation EXAM: PORTABLE CHEST 1 VIEW COMPARISON:  11/05/2021, 10/04/2021 FINDINGS: Single frontal view of the chest demonstrates stable enlargement of the cardiac silhouette. Dilated ectatic thoracic aorta compatible with known ascending thoracic aortic aneurysm. Increased density in the right paratracheal region consistent with vascular shadow and thyroid. No acute airspace disease, effusion, or pneumothorax. No acute bony abnormality. IMPRESSION: 1. Continued dilation and ectasia of the thoracic aorta consistent with known thoracic aortic aneurysm. 2. No acute airspace disease. Electronically Signed   By: Randa Ngo M.D.   On: 04/02/2022 18:54   DG Hip Unilat With Pelvis 2-3 Views Left  Result Date: 04/02/2022 CLINICAL DATA:  Pain after fall EXAM: DG HIP (WITH OR WITHOUT PELVIS) 3V LEFT COMPARISON:  None Available. FINDINGS: Portable x-rays. Left femoral neck fracture with displacement, foreshortening and angulation. Underlying osteopenia. Concentric joint space loss of both hips. Degenerative changes. Foley catheter. IMPRESSION: Displaced subcapital femoral neck fracture. Displaced, impacted femoral neck fracture on the left Electronically Signed   By: Jill Side M.D.   On: 04/02/2022 17:23      Subjective: Patient seen and examined at bedside today.  Hemodynamically stable for discharge.  Denies any significant pain.Called and discussed with daughter about discharge planning  Discharge Exam: Vitals:   04/04/22 2053 04/05/22 0522  BP: (!) 128/58 (!) 151/67  Pulse: 94 88  Resp:  15  Temp: 98.1 F  (36.7 C) 97.9 F (36.6 C)  SpO2: 93% 97%   Vitals:   04/04/22 0551 04/04/22 1003 04/04/22 2053 04/05/22 0522  BP: (!) 147/60 (!) 115/58 (!) 128/58 (!) 151/67  Pulse: 83 88 94 88  Resp: 17 16  15  $ Temp: 98.5 F (36.9 C) 98.3 F (36.8 C) 98.1 F (36.7 C) 97.9 F (36.6 C)  TempSrc: Oral  Oral Oral  SpO2: 100% 92% 93% 97%  Weight:      Height:        General: Pt is alert, awake, not in acute distress Cardiovascular: RRR, S1/S2 +, no rubs, no gallops Respiratory: CTA bilaterally, no wheezing, no rhonchi Abdominal: Soft, NT, ND, bowel sounds + Extremities: no edema, no cyanosis, clean surgical wound on the left hip    The results of significant diagnostics from this hospitalization (including imaging, microbiology, ancillary and laboratory) are listed below for reference.     Microbiology: Recent Results (from the past 240 hour(s))  Surgical PCR screen     Status: None   Collection Time: 04/02/22 11:29 PM   Specimen: Nasal Mucosa; Nasal Swab  Result Value Ref Range Status   MRSA, PCR NEGATIVE NEGATIVE Final   Staphylococcus aureus NEGATIVE NEGATIVE Final    Comment: (NOTE) The Xpert SA Assay (FDA approved for NASAL specimens in patients 39 years of age and older), is one component of a comprehensive surveillance program. It is not intended to diagnose infection nor to guide or monitor treatment. Performed at [facility], [place] [address], [place], [place] [address]      Labs: BNP (last 3 results) No results for input(s): "BNP" in the last 8760 hours.  Basic Metabolic Panel: Recent Labs  Lab 04/02/22 1650 04/04/22 0808  NA 144 138  K 3.7 3.7  CL 110 105  CO2 26 26  GLUCOSE 86 121*  BUN 25* 20  CREATININE 0.60 0.68  CALCIUM 8.5* 8.5*  MG 1.9  --   PHOS 3.2  --    Liver Function Tests: Recent Labs  Lab 04/02/22 1650  AST 25  ALT 20  ALKPHOS 58  BILITOT 0.6  PROT 6.2*  ALBUMIN 3.6   No results for input(s): "LIPASE", "AMYLASE" in  the last 168 hours. No results for input(s): "AMMONIA" in the last 168 hours. CBC: Recent Labs  Lab 04/02/22 1650 04/04/22 0808  WBC 5.5 9.2  NEUTROABS 4.1  --   HGB 12.2 12.3  HCT 38.9 38.3  MCV 97.7 95.5  PLT 116* 144*   Cardiac Enzymes: Recent Labs  Lab 04/02/22 1650  CKTOTAL 89   BNP: Invalid input(s): "POCBNP" CBG: No results for input(s): "GLUCAP" in the last 168 hours. D-Dimer No results for input(s): "DDIMER" in the last 72 hours. Hgb A1c No results for input(s): "HGBA1C" in the last 72 hours. Lipid Profile No results for input(s): "CHOL", "HDL", "LDLCALC", "TRIG", "CHOLHDL", "LDLDIRECT" in the last 72 hours. Thyroid function studies Recent Labs    04/03/22 0330  TSH 0.281*   Anemia work up No results for input(s): "VITAMINB12", "FOLATE", "FERRITIN", "TIBC", "IRON", "RETICCTPCT" in the last 72 hours. Urinalysis    Component Value Date/Time   COLORURINE YELLOW 04/02/2022 2330   APPEARANCEUR CLEAR 04/02/2022 2330   LABSPEC 1.025 04/02/2022 2330   PHURINE 5.0 04/02/2022 2330   GLUCOSEU NEGATIVE 04/02/2022 2330   HGBUR NEGATIVE 04/02/2022 2330   BILIRUBINUR NEGATIVE 04/02/2022 2330   KETONESUR 5 (A) 04/02/2022 2330   PROTEINUR NEGATIVE 04/02/2022 2330   UROBILINOGEN 0.2 12/01/2013 0910   NITRITE NEGATIVE 04/02/2022 2330   LEUKOCYTESUR NEGATIVE 04/02/2022 2330   Sepsis Labs Recent Labs  Lab 04/02/22 1650 04/04/22 0808  WBC 5.5 9.2   Microbiology Recent Results (from the past 240 hour(s))  Surgical PCR screen     Status: None   Collection Time: 04/02/22 11:29 PM   Specimen: Nasal Mucosa; Nasal Swab  Result Value Ref Range Status   MRSA, PCR NEGATIVE NEGATIVE Final   Staphylococcus aureus NEGATIVE NEGATIVE Final    Comment: (NOTE) The Xpert SA Assay (FDA approved for NASAL specimens in patients 65 years of age and older), is one component of a comprehensive surveillance program. It is not intended to diagnose infection nor to guide or monitor  treatment. Performed at [facility], [place] [address], [place], [place] [address]     Please note: You were cared for by a hospitalist during your hospital stay. Once you are discharged, your primary care physician will handle any further medical issues. Please note that NO REFILLS for any discharge medications will be authorized once you are discharged, as it is imperative that you return to your primary care physician (or establish a relationship with a primary care physician if you do not have one) for your post hospital discharge needs so that they can reassess your need for medications and monitor your lab values.    Time coordinating discharge: 40 minutes  SIGNED:   Shelly Coss, MD  [facility] 04/05/2022, 10:39 AM Pager [phone]  If 7PM-7AM, please contact night-coverage [email or web address] Password [email or web address]

## 2022-04-05 NOTE — Plan of Care (Signed)
  Problem: Education: Goal: Knowledge of General Education information will improve Description Including pain rating scale, medication(s)/side effects and non-pharmacologic comfort measures Outcome: Progressing   Problem: Activity: Goal: Risk for activity intolerance will decrease Outcome: Progressing   Problem: Pain Managment: Goal: General experience of comfort will improve Outcome: Progressing   Problem: Safety: Goal: Ability to remain free from injury will improve Outcome: Progressing   

## 2022-04-06 ENCOUNTER — Non-Acute Institutional Stay (SKILLED_NURSING_FACILITY): Payer: Medicare Other | Admitting: Adult Health

## 2022-04-06 ENCOUNTER — Encounter: Payer: Self-pay | Admitting: Adult Health

## 2022-04-06 DIAGNOSIS — E042 Nontoxic multinodular goiter: Secondary | ICD-10-CM

## 2022-04-06 DIAGNOSIS — S72002A Fracture of unspecified part of neck of left femur, initial encounter for closed fracture: Secondary | ICD-10-CM | POA: Diagnosis not present

## 2022-04-06 DIAGNOSIS — G3184 Mild cognitive impairment, so stated: Secondary | ICD-10-CM | POA: Diagnosis not present

## 2022-04-06 DIAGNOSIS — F5101 Primary insomnia: Secondary | ICD-10-CM

## 2022-04-06 DIAGNOSIS — E782 Mixed hyperlipidemia: Secondary | ICD-10-CM

## 2022-04-06 DIAGNOSIS — I7121 Aneurysm of the ascending aorta, without rupture: Secondary | ICD-10-CM

## 2022-04-06 DIAGNOSIS — Z96642 Presence of left artificial hip joint: Secondary | ICD-10-CM | POA: Diagnosis not present

## 2022-04-06 DIAGNOSIS — K5901 Slow transit constipation: Secondary | ICD-10-CM

## 2022-04-06 DIAGNOSIS — K219 Gastro-esophageal reflux disease without esophagitis: Secondary | ICD-10-CM

## 2022-04-06 DIAGNOSIS — R634 Abnormal weight loss: Secondary | ICD-10-CM | POA: Diagnosis not present

## 2022-04-06 LAB — T3, FREE: T3, Free: 1.6 pg/mL — ABNORMAL LOW (ref 2.0–4.4)

## 2022-04-06 NOTE — Progress Notes (Addendum)
Location:  Occupational psychologist of Service:  SNF (31) Provider:   Cindi Carbon, [place] [phone]   Virgie Dad, MD  [name], [name], [name], MD as Consulting Physician (Neurology) Garner Nash, [name], [place] (Optometry) Florinda Marker as Physician Assistant (Physician Assistant)  Extended Emergency Contact Information Primary Emergency Contact: Kuipers,George A Address: [address] [place], [place] [address] Johnnette Litter of [place] Phone: [phone] Mobile Phone: [phone] Relation: Spouse Secondary Emergency Contact: Annice Pih, [place] [address] Johnnette Litter of [place] Mobile Phone: [phone] Relation: Daughter  Code Status:  Full, changed during rehab admit Goals of care: Advanced Directive information    04/03/2022   11:49 AM  Advanced Directives  Does Patient Have a Medical Advance Directive? No  Would patient like information on creating a medical advance directive? No - Patient declined     Chief Complaint  Patient presents with   Acute Visit    S/p hospitalization for hip fracture.     HPI:  Pt is a 88 y.o. female seen today for a hospital f/u s/p admission from 04/02/22-04/05/22 due a fall with left hip pain which led to a total left hip arthroplasty 04/03/22.    PMH significant for MCI, aortic aneurysm, weight loss, GERD, atrial tachycardia, glaucoma, goiter, temporal arteritis, OAB, cough, HLD, CAD, and RLS  She is currently in skilled rehab at [facility] to receive therapy. She has MCI and her husband has dementia. He came over to the skilled care as well for more support.  Ms. B reports she had some pain with walking in her room and wishes to get off hydrocodone as soon as possible.  She is voiding well. Reports constipation, no BM since 2/19.  Denies numbness or tingling.  Goal to return home with her husband.   Had been losing weight and was placed on Remeron in Dec.  Did have CT of the abd for weight loss and was found to be severely constipated and placed on miralax Has Ascending aortic aneursym 5.7cm followed by Dr Cyndia Bent with conservative management.   Has low TSH and normal Free T4. Has been seen by endocrinology and was on methimazole. Not on list now. Nurse confirmed with family that they stopped this medication. Had goiter previously being monitored with ultrasound.   Past Medical History:  Diagnosis Date   Aortic aneurysm (HCC)    Arthritis    OA BOTH KNEES AND HANDS   Atrial tachycardia    HX OF   GERD (gastroesophageal reflux disease)    Glaucoma    left eye   Goiter    CAUSING COUGH, HOARSINESS AND DRY THROAT   Headache(784.0)    MIGRAINES    Hyperlipidemia    Neuralgia    Overactive bladder    RLS (restless legs syndrome)    Temporal arteritis (HCC)    Past Surgical History:  Procedure Laterality Date   ARTERY BIOPSY Right 07/21/2012   Procedure: BIOPSY TEMPORAL ARTERY;  Surgeon: Haywood Lasso, MD;  Location: [facility];  Service: General;  Laterality: Right;   CERVICAL FUSION  2012   Dr. Vertell Limber   EYE SURGERY     CATARACT EXTRACTION- BILATERAL   INCONTINENCE SURGERY  JOINT REPLACEMENT     right knee   LEFT SHOULDER SURGERY     RIGHT KNEE ARTHROSCOPY  11/2009     ROBOTIC ASSISTED BILATERAL SALPINGO OOPHERECTOMY Bilateral 12/08/2013   Procedure: ROBOTIC ASSISTED LAPAROSCOPIC BILATERAL SALPINGO OOPHORECTOMY WITH STAGING;  Surgeon: Everitt Amber, MD;  Location: [facility];  Service: Gynecology;  Laterality: Bilateral;   TOTAL HIP ARTHROPLASTY Left 04/03/2022   Procedure: TOTAL HIP ARTHROPLASTY ANTERIOR APPROACH;  Surgeon: Renette Butters, MD;  Location: [facility];  Service: Orthopedics;  Laterality: Left;   TOTAL KNEE ARTHROPLASTY  07/04/2011    Procedure: TOTAL KNEE ARTHROPLASTY;  Surgeon: Gearlean Alf, MD;  Location: [facility];  Service: Orthopedics;  Laterality: Right;    Allergies  Allergen Reactions   Lisinopril Other (See Comments) and Cough    Very low BP/"very tired" also   Mold Extract [Trichophyton] Other (See Comments)    Headaches and migraines   Molds & Smuts Other (See Comments)    Headaches and migraines   Sulfa Antibiotics    Codeine Nausea Only   Oxycodone Nausea Only    Outpatient Encounter Medications as of 04/06/2022  Medication Sig   aspirin EC 81 MG tablet Take 1 tablet (81 mg total) by mouth 2 (two) times daily. To prevent blood clots for 30 days after surgery.   brimonidine (ALPHAGAN) 0.2 % ophthalmic solution Place 1 drop into both eyes 2 (two) times daily.   Calcium Carbonate-Vitamin D (CALCIUM PLUS VITAMIN D PO) Take 1 tablet by mouth 2 (two) times a week.   Coenzyme Q10 (CO Q 10) 100 MG CAPS Take 100 mg by mouth 2 (two) times a week.   HYDROcodone-acetaminophen (NORCO) 5-325 MG tablet Take 1-2 tablets by mouth every 6 (six) hours as needed for severe pain. MAXIMUM TOTAL ACETAMINOPHEN DOSE IS 4000 MG PER DAY   mirtazapine (REMERON) 7.5 MG tablet TAKE 1 TABLET BY MOUTH AT BEDTIME.   Omega-3 Fatty Acids (FISH OIL) 1200 MG CAPS Take 1,200 mg by mouth 2 (two) times a week.   omeprazole (PRILOSEC) 40 MG capsule TAKE 1 CAPSULE (40 MG TOTAL) BY MOUTH DAILY. (Patient taking differently: Take 40 mg by mouth daily before breakfast.)   polyethylene glycol (MIRALAX / GLYCOLAX) 17 g packet Take 17 g by mouth daily.   rivastigmine (EXELON) 1.5 MG capsule TAKE 1 CAPSULE EACH MORNING. (Patient taking differently: Take 1.5 mg by mouth in the morning. TAKE 1 CAPSULE EACH MORNING.)   rivastigmine (EXELON) 3 MG capsule TAKE 1 CAPSULE (3 MG TOTAL) BY MOUTH AT BEDTIME.   rosuvastatin (CRESTOR) 10 MG tablet TAKE 1 TABLET BY MOUTH EVERY DAY (Patient taking differently: Take 10 mg by mouth daily.)   senna-docusate (SENOKOT-S)  8.6-50 MG tablet Take 1 tablet by mouth 2 (two) times daily.   SYSTANE ULTRA PF 0.4-0.3 % SOLN Apply 1 drop to eye 3 (three) times daily as needed (for dryness).   No facility-administered encounter medications on file as of 04/06/2022.    Review of Systems  Constitutional:  Positive for activity change. Negative for appetite change, chills, diaphoresis, fatigue, fever and unexpected weight change.  HENT:  Negative for congestion.   Respiratory:  Negative for cough, shortness of breath and wheezing.   Cardiovascular:  Positive for leg swelling (slight after surgery). Negative for chest pain and palpitations.  Gastrointestinal:  Positive for constipation. Negative for abdominal distention, abdominal pain and diarrhea.  Genitourinary:  Negative for difficulty urinating and dysuria.  Musculoskeletal:  Positive for arthralgias and gait problem.  Negative for back pain, joint swelling and myalgias.  Skin:  Positive for wound.  Neurological:  Negative for dizziness, tremors, seizures, syncope, facial asymmetry, speech difficulty, weakness, light-headedness, numbness and headaches.  Psychiatric/Behavioral:  Negative for agitation, behavioral problems and confusion.        Memory loss    Immunization History  Administered Date(s) Administered   Influenza Split 11/13/2012   Influenza, High Dose Seasonal PF 11/12/2016, 10/18/2017, 10/10/2018   Influenza,inj,quad, With Preservative 10/10/2018   Influenza-Unspecified 11/11/2008, 10/31/2009, 11/07/2011, 11/11/2013, 11/13/2016   Moderna Covid-19 Vaccine Bivalent Booster 10yr & up 11/22/2021   Pneumococcal Conjugate-13 06/24/2013   Pneumococcal Polysaccharide-23 09/03/2014   Respiratory Syncytial Virus Vaccine,Recomb Aduvanted(Arexvy) 11/22/2021   Td 12/11/2017   Zoster Recombinat (Shingrix) 08/10/2016, 11/19/2016   Zoster, Live 06/24/2013   Zoster, Unspecified 11/19/2016   Pertinent  Health Maintenance Due  Topic Date Due   INFLUENZA VACCINE   09/12/2021   DEXA SCAN  Completed      03/04/2020   11:53 AM 02/23/2021    2:36 PM 11/05/2021    9:14 PM 12/19/2021    4:47 PM 01/16/2022    3:58 PM  Fall Risk  Falls in the past year?    0 0  Was there an injury with Fall?    0 0  Fall Risk Category Calculator    0 0  Fall Risk Category (Retired)    Low Low  (RETIRED) Patient Fall Risk Level Low fall risk Low fall risk Low fall risk Low fall risk Low fall risk  Patient at Risk for Falls Due to    No Fall Risks No Fall Risks  Fall risk Follow up    Falls evaluation completed Falls evaluation completed   Functional Status Survey:    Vitals:   04/06/22 1121 04/06/22 1139  BP: 111/68   Pulse: 94   Resp: 18   Temp: 99 F (37.2 C)   SpO2: 95%   Weight:  111 lb (50.3 kg)   Body mass index is 19.35 kg/m. Weight: 111 lb (50.3 kg)  Wt Readings from Last 3 Encounters:  04/06/22 111 lb (50.3 kg)  04/03/22 101 lb (45.8 kg)  01/16/22 111 lb (50.3 kg)    Physical Exam Vitals and nursing note reviewed.  Constitutional:      General: She is not in acute distress.    Appearance: She is not diaphoretic.  HENT:     Head: Normocephalic and atraumatic.  Neck:     Vascular: No JVD.  Cardiovascular:     Rate and Rhythm: Normal rate and regular rhythm.     Heart sounds: No murmur heard. Pulmonary:     Effort: Pulmonary effort is normal. No respiratory distress.     Breath sounds: Normal breath sounds. No wheezing.  Abdominal:     General: Bowel sounds are normal. There is no distension.     Palpations: Abdomen is soft.     Tenderness: There is no abdominal tenderness.  Musculoskeletal:     Comments: Trace edema bilat  Skin:    General: Skin is warm and dry.     Comments: Left hip dressing CDI, no surrounding redness or warmth to incision site. Mild swelling noted.   Neurological:     Mental Status: She is alert and oriented to person, place, and time.  Psychiatric:        Mood and Affect: Mood normal.     Labs  reviewed: Recent Labs    11/09/21 1452 12/29/21 1255 04/02/22  1650 04/04/22 0808  NA 139  --  144 138  K 4.1  --  3.7 3.7  CL 101  --  110 105  CO2 25  --  26 26  GLUCOSE 91  --  86 121*  BUN 23  --  25* 20  CREATININE 0.78 0.80 0.60 0.68  CALCIUM 9.6  --  8.5* 8.5*  MG  --   --  1.9  --   PHOS  --   --  3.2  --    Recent Labs    11/05/21 2127 04/02/22 1650  AST 15 25  ALT 13 20  ALKPHOS 76 58  BILITOT 0.5 0.6  PROT 6.9 6.2*  ALBUMIN 4.3 3.6   Recent Labs    11/05/21 2127 04/02/22 1650 04/04/22 0808  WBC 6.3 5.5 9.2  NEUTROABS 4.1 4.1  --   HGB 13.8 12.2 12.3  HCT 41.5 38.9 38.3  MCV 96.1 97.7 95.5  PLT 156 116* 144*   Lab Results  Component Value Date   TSH 0.281 (L) 04/03/2022   Lab Results  Component Value Date   HGBA1C 5.4 05/21/2014   Lab Results  Component Value Date   CHOL 134 03/18/2020   HDL 67 03/18/2020   LDLCALC 57 03/18/2020   TRIG 39 03/18/2020   CHOLHDL 2.0 03/18/2020    Significant Diagnostic Results in last 30 days:  DG HIP UNILAT WITH PELVIS 2-3 VIEWS LEFT  Result Date: 04/03/2022 CLINICAL DATA:  [id number] Hip fracture ([place]) [id number] EXAM: DG HIP (WITH OR WITHOUT PELVIS) 2-3V LEFT COMPARISON:  04/02/2022 FINDINGS: Postsurgical changes of left total hip arthroplasty. Normal alignment. No evidence of immediate hardware complication. IMPRESSION: Postsurgical changes of left total hip arthroplasty. No evidence of immediate hardware complication. Normal alignment. Electronically Signed   By: Maurine Simmering M.D.   On: 04/03/2022 15:02   DG C-Arm 1-60 Min-No Report  Result Date: 04/03/2022 Fluoroscopy was utilized by the requesting physician.  No radiographic interpretation.   DG C-Arm 1-60 Min-No Report  Result Date: 04/03/2022 Fluoroscopy was utilized by the requesting physician.  No radiographic interpretation.   ECHOCARDIOGRAM COMPLETE  Result Date: 04/03/2022    ECHOCARDIOGRAM REPORT   Patient Name:   Janet Wilson Date of  Exam: 04/03/2022 Medical Rec #:  [id number]                   Height:       63.5 in Accession #:    [id number]                  Weight:       105.0 lb Date of Birth:  1934-04-24                   BSA:          1.479 m Patient Age:    88 years                    BP:           159/68 mmHg Patient Gender: F                           HR:           80 bpm. Exam Location:  Inpatient Procedure: 2D Echo, 3D Echo, Cardiac Doppler and Color Doppler STAT ECHO REPORT CONTAINS CRITICAL RESULT Indications:    Z01.818 Encounter for other preprocedural examination  History:        Patient has prior history of Echocardiogram examinations, most                 recent 01/12/2020. CAD, Abnormal ECG, Aortic Valve Disease;                 Arrythmias:A TACH and LBBB. Ascending aortic aneurysm. AI.  Sonographer:    Roseanna Rainbow RDCS Referring Phys: [place]  Sonographer Comments: Technically difficult study due to poor echo windows. Patient with hip fracture. Could not turn. IMPRESSIONS  1. Aortic dilatation noted. There is severe dilatation of the ascending aorta, measuring 60 mm.  2. Aortic regurgitation mechanism appears to be from annular dilation. There is a largely central jet. The aortic valve is tricuspid. There is moderate calcification of the aortic valve. Aortic valve regurgitation is moderate to severe. Mild aortic valve stenosis. Aortic valve mean gradient measures 7.0 mmHg.  3. Left ventricular ejection fraction, by estimation, is 55%. The left ventricle has low normal function. The left ventricle has no regional wall motion abnormalities. There is moderate asymmetric left ventricular hypertrophy of the septal segment. Left  ventricular diastolic parameters are indeterminate. Elevated left atrial pressure.  4. Right ventricular systolic function is normal. The right ventricular size is normal. There is mildly elevated pulmonary artery systolic pressure. The estimated right ventricular systolic pressure is [id number] mmHg.   5. 2D MVA 2.89 cm2, continuity equation defered in the setting of AI; suspect mild mitral stenosis. The mitral valve is degenerative. No evidence of mitral valve regurgitation. The mean mitral valve gradient is 4.0 mmHg.  6. The inferior vena cava is dilated in size with <50% respiratory variability, suggesting right atrial pressure of 15 mmHg. Comparison(s): Prior images reviewed side by side. Aortic maximum dimension has increased from prior; recommend clinical correlation with recent CT aorta protocol as oblique echo measurements can lead to over-estimation. Conclusion(s)/Recommendation(s): Reaching out to inpatient cardioogy team. FINDINGS  Left Ventricle: Left ventricular ejection fraction, by estimation, is 55%. The left ventricle has low normal function. The left ventricle has no regional wall motion abnormalities. The left ventricular internal cavity size was normal in size. There is moderate asymmetric left ventricular hypertrophy of the septal segment. Left ventricular diastolic function could not be evaluated due to mitral annular calcification (moderate or greater). Left ventricular diastolic parameters are indeterminate. Elevated left atrial pressure. Right Ventricle: The right ventricular size is normal. No increase in right ventricular wall thickness. Right ventricular systolic function is normal. There is mildly elevated pulmonary artery systolic pressure. The tricuspid regurgitant velocity is 2.39  m/s, and with an assumed right atrial pressure of 15 mmHg, the estimated right ventricular systolic pressure is [id number] mmHg. Left Atrium: Left atrial size was normal in size. Right Atrium: Right atrial size was normal in size. Pericardium: Trivial pericardial effusion is present. The pericardial effusion is posterior to the left ventricle. Mitral Valve: 2D MVA 2.89 cm2, continuity equation defered in the setting of AI; suspect mild mitral stenosis. The mitral valve is degenerative in appearance. No  evidence of mitral valve regurgitation. MV peak gradient, 8.6 mmHg. The mean mitral valve gradient is 4.0 mmHg with average heart rate of 82 bpm. Tricuspid Valve: The tricuspid valve is not well visualized. Tricuspid valve regurgitation is trivial. No evidence of tricuspid stenosis. Aortic Valve: Aortic regurgitation mechanism appears to be from annular dilation. There is a largely central jet. The aortic valve is tricuspid. There is moderate calcification of the aortic valve.  Aortic valve regurgitation is moderate to severe. Aortic  regurgitation PHT measures 529 msec. Mild aortic stenosis is present. Aortic valve mean gradient measures 7.0 mmHg. Aortic valve peak gradient measures 12.5 mmHg. Aortic valve area, by VTI measures 3.12 cm. Pulmonic Valve: The pulmonic valve was not well visualized. Pulmonic valve regurgitation is mild to moderate. No evidence of pulmonic stenosis. Aorta: Aortic dilatation noted. There is severe dilatation of the ascending aorta, measuring 60 mm. Venous: The inferior vena cava is dilated in size with less than 50% respiratory variability, suggesting right atrial pressure of 15 mmHg. IAS/Shunts: The interatrial septum was not well visualized.  LEFT VENTRICLE PLAX 2D LVIDd:         3.70 cm      Diastology LVIDs:         2.70 cm      LV e' medial:    4.46 cm/s LV PW:         1.40 cm      LV E/e' medial:  21.9 LV IVS:        1.30 cm      LV e' lateral:   9.03 cm/s LVOT diam:     2.40 cm      LV E/e' lateral: 10.8 LV SV:         105 LV SV Index:   71 LVOT Area:     4.52 cm  LV Volumes (MOD) LV vol d, MOD A2C: 96.4 ml LV vol d, MOD A4C: 138.0 ml LV vol s, MOD A2C: 39.2 ml LV vol s, MOD A4C: 72.7 ml LV SV MOD A2C:     57.2 ml LV SV MOD A4C:     138.0 ml LV SV MOD BP:      58.5 ml RIGHT VENTRICLE            IVC RV S prime:     5.66 cm/s  IVC diam: 2.20 cm TAPSE (M-mode): 1.3 cm LEFT ATRIUM             Index        RIGHT ATRIUM          Index LA diam:        2.90 cm 1.96 cm/m   RA Area:      4.90 cm LA Vol (A2C):   34.1 ml 23.05 ml/m  RA Volume:   6.28 ml  4.25 ml/m LA Vol (A4C):   32.1 ml 21.70 ml/m LA Biplane Vol: 36.1 ml 24.41 ml/m  AORTIC VALVE                     PULMONIC VALVE AV Area (Vmax):    3.14 cm      PR End Diast Vel: 1.90 msec AV Area (Vmean):   3.01 cm AV Area (VTI):     3.12 cm AV Vmax:           177.00 cm/s AV Vmean:          124.000 cm/s AV VTI:            0.335 m AV Peak Grad:      12.5 mmHg AV Mean Grad:      7.0 mmHg LVOT Vmax:         123.00 cm/s LVOT Vmean:        82.600 cm/s LVOT VTI:          0.231 m LVOT/AV VTI ratio: 0.69 AI PHT:  529 msec  AORTA Ao Root diam: 3.30 cm Ao Asc diam:  5.90 cm MITRAL VALVE                TRICUSPID VALVE MV Area (PHT): 5.54 cm     TR Peak grad:   22.8 mmHg MV Area VTI:   3.21 cm     TR Vmax:        239.00 cm/s MV Peak grad:  8.6 mmHg MV Mean grad:  4.0 mmHg     SHUNTS MV Vmax:       1.47 m/s     Systemic VTI:  0.23 m MV Vmean:      93.6 cm/s    Systemic Diam: 2.40 cm MV Decel Time: 137 msec MV E velocity: 97.50 cm/s MV A velocity: 126.00 cm/s MV E/A ratio:  0.77 Rudean Haskell MD Electronically signed by Rudean Haskell MD Signature Date/Time: 04/03/2022/10:20:28 AM    Final    CT CERVICAL SPINE WO CONTRAST  Result Date: 04/02/2022 CLINICAL DATA:  [place] from chair EXAM: CT CERVICAL SPINE WITHOUT CONTRAST TECHNIQUE: Multidetector CT imaging of the cervical spine was performed without intravenous contrast. Multiplanar CT image reconstructions were also generated. RADIATION DOSE REDUCTION: This exam was performed according to the departmental dose-optimization program which includes automated exposure control, adjustment of the mA and/or kV according to patient size and/or use of iterative reconstruction technique. COMPARISON:  02/23/2021 FINDINGS: Alignment: Stable mild degenerative anterolisthesis of C7 on T1. Otherwise alignment is grossly anatomic. Skull base and vertebrae: No acute fracture. No primary bone  lesion or focal pathologic process. Soft tissues and spinal canal: No prevertebral fluid or swelling. No visible canal hematoma. Disc levels: Prior ACDF spanning C4 through C7. There is mild diffuse spondylosis and facet hypertrophy throughout the nonsurgical levels. No change since prior exam. Upper chest: Airway is patent. Lung apices are clear. Known thyroid goiter, previously evaluated by ultrasound and biopsy. Other: Reconstructed images demonstrate no additional findings. IMPRESSION: 1. No acute cervical spine fracture. 2. Stable postsurgical and degenerative changes of the cervical spine. Electronically Signed   By: Randa Ngo M.D.   On: 04/02/2022 19:28   CT HEAD WO CONTRAST  Result Date: 04/02/2022 CLINICAL DATA:  [place] from chair EXAM: CT HEAD WITHOUT CONTRAST TECHNIQUE: Contiguous axial images were obtained from the base of the skull through the vertex without intravenous contrast. RADIATION DOSE REDUCTION: This exam was performed according to the departmental dose-optimization program which includes automated exposure control, adjustment of the mA and/or kV according to patient size and/or use of iterative reconstruction technique. COMPARISON:  02/23/2021 FINDINGS: Brain: Chronic small vessel ischemic changes within the white matter and bilateral basal ganglia. No acute infarct or hemorrhage. Lateral ventricles and midline structures are unremarkable. No acute extra-axial fluid collections. No mass effect. Vascular: No hyperdense vessel or unexpected calcification. Skull: Normal. Negative for fracture or focal lesion. Sinuses/Orbits: Chronic left sphenoid sinus disease. The remaining paranasal sinuses are clear. Other: None. IMPRESSION: 1. No acute intracranial process. Electronically Signed   By: Randa Ngo M.D.   On: 04/02/2022 19:25   DG Chest Port 1 View  Result Date: 04/02/2022 CLINICAL DATA:  Pain, left hip fracture, preoperative evaluation EXAM: PORTABLE CHEST 1 VIEW COMPARISON:   11/05/2021, 10/04/2021 FINDINGS: Single frontal view of the chest demonstrates stable enlargement of the cardiac silhouette. Dilated ectatic thoracic aorta compatible with known ascending thoracic aortic aneurysm. Increased density in the right paratracheal region consistent with vascular shadow and thyroid. No acute airspace disease,  effusion, or pneumothorax. No acute bony abnormality. IMPRESSION: 1. Continued dilation and ectasia of the thoracic aorta consistent with known thoracic aortic aneurysm. 2. No acute airspace disease. Electronically Signed   By: Randa Ngo M.D.   On: 04/02/2022 18:54   DG Hip Unilat With Pelvis 2-3 Views Left  Result Date: 04/02/2022 CLINICAL DATA:  Pain after fall EXAM: DG HIP (WITH OR WITHOUT PELVIS) 3V LEFT COMPARISON:  None Available. FINDINGS: Portable x-rays. Left femoral neck fracture with displacement, foreshortening and angulation. Underlying osteopenia. Concentric joint space loss of both hips. Degenerative changes. Foley catheter. IMPRESSION: Displaced subcapital femoral neck fracture. Displaced, impacted femoral neck fracture on the left Electronically Signed   By: Jill Side M.D.   On: 04/02/2022 17:23    Assessment/Plan 1. Closed fracture of left hip, initial encounter ([place]) From a fall   2. S/P total left hip arthroplasty WBAT PT and OT ASA bid x 30 days F/U with ortho Hydrocodone prn for pain   3. MCI (mild cognitive impairment) Currently on exelon Will receive MMSE while she is here Followed by Dr Jaynee Eagles  4. Weight loss Started Remeron Dec of 2023  Weight during this admission the same as Dec   5. Primary insomnia On remeron, denies sleep issues at this time.   6. Mixed hyperlipidemia Lab Results  Component Value Date   LDLCALC 57 03/18/2020   On crestor.   7. Multinodular goiter (nontoxic) Was on methimazole  TSH low with normal T4 last apt in epic with endocrinology is 02/2021  Nurse confirms methimazole was stopped due to  lack of efficacy by pt   8. Aneurysm of ascending aorta without rupture [facility]) Not a surgical candidate per notes.   9. Gastroesophageal reflux disease without esophagitis Currently on prilosec no symptoms  10. Constipation No BM took MOM, will try supp if not working.    Family/ staff Communication: nurse  Labs/tests ordered:  NA

## 2022-04-09 ENCOUNTER — Non-Acute Institutional Stay (SKILLED_NURSING_FACILITY): Payer: Medicare Other | Admitting: Internal Medicine

## 2022-04-09 ENCOUNTER — Encounter: Payer: Self-pay | Admitting: Internal Medicine

## 2022-04-09 DIAGNOSIS — Z96642 Presence of left artificial hip joint: Secondary | ICD-10-CM | POA: Diagnosis not present

## 2022-04-09 DIAGNOSIS — E042 Nontoxic multinodular goiter: Secondary | ICD-10-CM

## 2022-04-09 DIAGNOSIS — R413 Other amnesia: Secondary | ICD-10-CM | POA: Diagnosis not present

## 2022-04-09 DIAGNOSIS — R634 Abnormal weight loss: Secondary | ICD-10-CM

## 2022-04-09 DIAGNOSIS — G3184 Mild cognitive impairment, so stated: Secondary | ICD-10-CM | POA: Diagnosis not present

## 2022-04-09 DIAGNOSIS — I7121 Aneurysm of the ascending aorta, without rupture: Secondary | ICD-10-CM

## 2022-04-09 DIAGNOSIS — E782 Mixed hyperlipidemia: Secondary | ICD-10-CM | POA: Diagnosis not present

## 2022-04-09 DIAGNOSIS — M6389 Disorders of muscle in diseases classified elsewhere, multiple sites: Secondary | ICD-10-CM | POA: Diagnosis not present

## 2022-04-09 DIAGNOSIS — K219 Gastro-esophageal reflux disease without esophagitis: Secondary | ICD-10-CM

## 2022-04-09 DIAGNOSIS — F5101 Primary insomnia: Secondary | ICD-10-CM | POA: Diagnosis not present

## 2022-04-09 DIAGNOSIS — M25552 Pain in left hip: Secondary | ICD-10-CM | POA: Diagnosis not present

## 2022-04-09 DIAGNOSIS — R278 Other lack of coordination: Secondary | ICD-10-CM | POA: Diagnosis not present

## 2022-04-09 DIAGNOSIS — R2689 Other abnormalities of gait and mobility: Secondary | ICD-10-CM | POA: Diagnosis not present

## 2022-04-09 DIAGNOSIS — M25652 Stiffness of left hip, not elsewhere classified: Secondary | ICD-10-CM | POA: Diagnosis not present

## 2022-04-09 NOTE — Progress Notes (Signed)
Provider:   Location:  Occupational psychologist of Service:  SNF (31)  PCP: Virgie Dad, MD [name], [name], [name], [name], [name], [name],[name], [name], [name]  Code Status: Full code Goals of Care: Advanced Directive information    04/03/2022   11:49 AM  Advanced Directives  Does Patient Have a Medical Advance Directive? No  Would patient like information on creating a medical advance directive? No - Patient declined      Chief Complaint  Patient presents with   New Admit To SNF    HPI: Patient is a 88 y.o. female seen today for admission to Rehab  Admitted From 02/19-02/22 for Left Hip Fracture S/P Left Hip Arthroplasty on 04/03/2022  Lives in [place] in [place] with her Husband   H/o  Depression and anxiety with insomnia  MCI Is on Exelon for last 8 years per Neurology.  Still drives independent in her ADLs takes her own medications walk with no assist Ascending aortic aneurysm 5.7 cm Follows with Dr. Caffie Pinto.  Also has a history of nonobstructive CAD, moderate AR, LBBB  Fell in her apartment Denies any Dizziness or LOC Found to heva Left Hip Fracture Underwent Arthroplasty Doing well Walking with her walker Pain Controlled Sleeping and eating well Wants to go homw by end of this  week   Past Medical History:  Diagnosis Date   Aortic aneurysm (HCC)    Arthritis    OA BOTH KNEES AND HANDS   Atrial tachycardia    HX OF   GERD (gastroesophageal reflux disease)    Glaucoma    left eye   Goiter    CAUSING COUGH, HOARSINESS AND DRY THROAT   Headache(784.0)    MIGRAINES    Hyperlipidemia    Neuralgia    Overactive bladder    RLS (restless legs syndrome)    Temporal arteritis ([place])    Past Surgical History:  Procedure Laterality Date   ARTERY BIOPSY Right 07/21/2012   Procedure: BIOPSY TEMPORAL ARTERY;  Surgeon: Haywood Lasso, MD;  Location: [place] [facility];  Service: General;  Laterality: Right;   CERVICAL FUSION  2012   Dr. Vertell Limber   EYE SURGERY     CATARACT EXTRACTION- BILATERAL   INCONTINENCE SURGERY     JOINT REPLACEMENT     right knee   LEFT SHOULDER SURGERY     RIGHT KNEE ARTHROSCOPY  11/2009     ROBOTIC ASSISTED BILATERAL SALPINGO OOPHERECTOMY Bilateral 12/08/2013   Procedure: ROBOTIC ASSISTED LAPAROSCOPIC BILATERAL SALPINGO OOPHORECTOMY WITH STAGING;  Surgeon: Everitt Amber, MD;  Location: [facility];  Service: Gynecology;  Laterality: Bilateral;   TOTAL HIP ARTHROPLASTY Left 04/03/2022   Procedure: TOTAL HIP ARTHROPLASTY ANTERIOR APPROACH;  Surgeon: Renette Butters, MD;  Location: [facility];  Service: Orthopedics;  Laterality: Left;   TOTAL KNEE ARTHROPLASTY  07/04/2011   Procedure: TOTAL KNEE ARTHROPLASTY;  Surgeon:  Gearlean Alf, MD;  Location: [facility];  Service: Orthopedics;  Laterality: Right;    reports that she has never smoked. She has never used smokeless tobacco. She reports current alcohol use of about 1.0 standard drink of alcohol per week. She reports that she does not use drugs. Social History   Socioeconomic History   Marital status: Married    Spouse name: [name]   Number of children: 2   Years of education: college   Highest education level: Not on file  Occupational History   Occupation: retired    Fish farm manager: RETIRED  Tobacco Use    Smoking status: Never   Smokeless tobacco: Never  Vaping Use   Vaping Use: Never used  Substance and Sexual Activity   Alcohol use: Yes    Alcohol/week: 1.0 standard drink of alcohol    Types: 1 Glasses of wine per week    Comment: SELDOM, very little   Drug use: No   Sexual activity: Yes  Other Topics Concern   Not on file  Social History Narrative   Pt lives at home with family.   Caffeine Use: Very little.    Patient is right handed       Social History      Diet? No       Do you drink/eat things with caffeine? Some coffee      Marital status?    Married                                What year were you married? 1961      Do you live in a house, apartment, assisted living, condo, trailer, etc.?  [employer] [employer]       Is it one or more stories? one      How many persons live in your home? two      Do you have any pets in your home? No       Highest level of education completed? 14 years       Current or past profession:  Chemical engineer       Do you exercise?              Yes                        Type & how often? Water aerobic and balance class      Advanced Directives      Do you have a living will?  yes      Do you have a DNR form?        yes                          If not, do you want to discuss one?      Do you have signed POA/HPOA for forms? yes      Functional Status      Do you have difficulty bathing or dressing yourself?      Do you have difficulty preparing food or eating?       Do you have difficulty managing your medications?      Do you have difficulty managing your finances?      Do you have difficulty affording your medications?   Social Determinants of Health   Financial Resource Strain: Not on file  Food Insecurity: No Food Insecurity (04/02/2022)   Hunger Vital  Sign    Worried About Charity fundraiser in the Last Year: Never true    [place] in the Last Year: Never true  Transportation Needs: No Transportation Needs (04/02/2022)    PRAPARE - Hydrologist (Medical): No    Lack of Transportation (Non-Medical): No  Physical Activity: Not on file  Stress: Not on file  Social Connections: Not on file  Intimate Partner Violence: Not At Risk (04/02/2022)   Humiliation, Afraid, Rape, and Kick questionnaire    Fear of Current or Ex-Partner: No    Emotionally Abused: No    Physically Abused: No    Sexually Abused: No    Functional Status Survey:    Family History  Problem Relation Age of Onset   Alzheimer's disease Mother    Heart attack Father     Health Maintenance  Topic Date Due   Medicare Annual Wellness (AWV)  04/18/2021   INFLUENZA VACCINE  09/12/2021   COVID-19 Vaccine (2 - Moderna risk series) 12/20/2021   DTaP/Tdap/Td (2 - Tdap) 12/12/2027   Pneumonia Vaccine 30+ Years old  Completed   DEXA SCAN  Completed   Zoster Vaccines- Shingrix  Completed   HPV VACCINES  Aged Out    Allergies  Allergen Reactions   Lisinopril Other (See Comments) and Cough    Very low BP/"very tired" also   Mold Extract [Trichophyton] Other (See Comments)    Headaches and migraines   Molds & Smuts Other (See Comments)    Headaches and migraines   Sulfa Antibiotics    Codeine Nausea Only   Oxycodone Nausea Only    Outpatient Encounter Medications as of 04/09/2022  Medication Sig   aspirin EC 81 MG tablet Take 1 tablet (81 mg total) by mouth 2 (two) times daily. To prevent blood clots for 30 days after surgery.   brimonidine (ALPHAGAN) 0.2 % ophthalmic solution Place 1 drop into both eyes 2 (two) times daily.   Calcium Carbonate-Vitamin D (CALCIUM PLUS VITAMIN D PO) Take 1 tablet by mouth 2 (two) times a week.   Coenzyme Q10 (CO Q 10) 100 MG CAPS Take 100 mg by mouth 2 (two) times a week.   HYDROcodone-acetaminophen (NORCO) 5-325 MG tablet Take 1-2 tablets by mouth every 6 (six) hours as needed for severe pain. MAXIMUM TOTAL ACETAMINOPHEN DOSE IS 4000 MG PER DAY   mirtazapine (REMERON) 7.5  MG tablet TAKE 1 TABLET BY MOUTH AT BEDTIME.   Omega-3 Fatty Acids (FISH OIL) 1200 MG CAPS Take 1,200 mg by mouth 2 (two) times a week.   omeprazole (PRILOSEC) 40 MG capsule TAKE 1 CAPSULE (40 MG TOTAL) BY MOUTH DAILY. (Patient taking differently: Take 40 mg by mouth daily before breakfast.)   polyethylene glycol (MIRALAX / GLYCOLAX) 17 g packet Take 17 g by mouth daily.   rivastigmine (EXELON) 1.5 MG capsule TAKE 1 CAPSULE EACH MORNING. (Patient taking differently: Take 1.5 mg by mouth in the morning. TAKE 1 CAPSULE EACH MORNING.)   rivastigmine (EXELON) 3 MG capsule TAKE 1 CAPSULE (3 MG TOTAL) BY MOUTH AT BEDTIME.   rosuvastatin (CRESTOR) 10 MG tablet TAKE 1 TABLET BY MOUTH EVERY DAY (Patient taking differently: Take 10 mg by mouth daily.)   senna-docusate (SENOKOT-S) 8.6-50 MG tablet Take 1 tablet by mouth 2 (two) times daily.   SYSTANE ULTRA PF 0.4-0.3 % SOLN Apply 1 drop to eye 3 (three) times daily as needed (for dryness).   No facility-administered encounter medications on  file as of 04/09/2022.    Review of Systems  Constitutional:  Negative for activity change and appetite change.  HENT: Negative.    Respiratory:  Negative for cough and shortness of breath.   Cardiovascular:  Negative for leg swelling.  Gastrointestinal:  Negative for constipation.  Genitourinary: Negative.   Musculoskeletal:  Positive for gait problem. Negative for arthralgias and myalgias.  Skin: Negative.   Neurological:  Negative for dizziness and weakness.  Psychiatric/Behavioral:  Negative for confusion, dysphoric mood and sleep disturbance.     Vitals:   04/09/22 1555  BP: 128/75  Pulse: 97  Resp: 18  Temp: 98.5 F (36.9 C)  SpO2: 95%   There is no height or weight on file to calculate BMI. Physical Exam Vitals reviewed.  Constitutional:      Appearance: Normal appearance.  HENT:     Head: Normocephalic.     Nose: Nose normal.     Mouth/Throat:     Mouth: Mucous membranes are moist.      Pharynx: Oropharynx is clear.  Eyes:     Pupils: Pupils are equal, round, and reactive to light.  Cardiovascular:     Rate and Rhythm: Normal rate and regular rhythm.     Pulses: Normal pulses.     Heart sounds: Normal heart sounds. No murmur heard. Pulmonary:     Effort: Pulmonary effort is normal.     Breath sounds: Normal breath sounds.  Abdominal:     General: Abdomen is flat. Bowel sounds are normal.     Palpations: Abdomen is soft.  Musculoskeletal:        General: No swelling.     Cervical back: Neck supple.     Comments: Mild Left leg swelling  Skin:    General: Skin is warm.  Neurological:     General: No focal deficit present.     Mental Status: She is alert and oriented to person, place, and time.  Psychiatric:        Mood and Affect: Mood normal.        Thought Content: Thought content normal.     Labs reviewed: Basic Metabolic Panel: Recent Labs    11/09/21 1452 12/29/21 1255 04/02/22 1650 04/04/22 0808  NA 139  --  144 138  K 4.1  --  3.7 3.7  CL 101  --  110 105  CO2 25  --  26 26  GLUCOSE 91  --  86 121*  BUN 23  --  25* 20  CREATININE 0.78 0.80 0.60 0.68  CALCIUM 9.6  --  8.5* 8.5*  MG  --   --  1.9  --   PHOS  --   --  3.2  --    Liver Function Tests: Recent Labs    11/05/21 2127 04/02/22 1650  AST 15 25  ALT 13 20  ALKPHOS 76 58  BILITOT 0.5 0.6  PROT 6.9 6.2*  ALBUMIN 4.3 3.6   No results for input(s): "LIPASE", "AMYLASE" in the last 8760 hours. No results for input(s): "AMMONIA" in the last 8760 hours. CBC: Recent Labs    11/05/21 2127 04/02/22 1650 04/04/22 0808  WBC 6.3 5.5 9.2  NEUTROABS 4.1 4.1  --   HGB 13.8 12.2 12.3  HCT 41.5 38.9 38.3  MCV 96.1 97.7 95.5  PLT 156 116* 144*   Cardiac Enzymes: Recent Labs    04/02/22 1650  CKTOTAL 89   BNP: Invalid input(s): "POCBNP" Lab Results  Component Value Date  HGBA1C 5.4 05/21/2014   Lab Results  Component Value Date   TSH 0.281 (L) 04/03/2022   Lab Results   Component Value Date   VITAMINB12 786 12/11/2017   Lab Results  Component Value Date   FOLATE >20.0 08/02/2015   Lab Results  Component Value Date   IRON 57 09/16/2017   TIBC 239 (L) 09/16/2017   FERRITIN 82 09/16/2017    Imaging and Procedures obtained prior to SNF admission: DG HIP UNILAT WITH PELVIS 2-3 VIEWS LEFT  Result Date: 04/03/2022 CLINICAL DATA:  [id number] Hip fracture ([place]) [id number] EXAM: DG HIP (WITH OR WITHOUT PELVIS) 2-3V LEFT COMPARISON:  04/02/2022 FINDINGS: Postsurgical changes of left total hip arthroplasty. Normal alignment. No evidence of immediate hardware complication. IMPRESSION: Postsurgical changes of left total hip arthroplasty. No evidence of immediate hardware complication. Normal alignment. Electronically Signed   By: Maurine Simmering M.D.   On: 04/03/2022 15:02   DG C-Arm 1-60 Min-No Report  Result Date: 04/03/2022 Fluoroscopy was utilized by the requesting physician.  No radiographic interpretation.   DG C-Arm 1-60 Min-No Report  Result Date: 04/03/2022 Fluoroscopy was utilized by the requesting physician.  No radiographic interpretation.   ECHOCARDIOGRAM COMPLETE  Result Date: 04/03/2022    ECHOCARDIOGRAM REPORT   Patient Name:   Janet Wilson Date of Exam: 04/03/2022 Medical Rec #:  [id number]                   Height:       63.5 in Accession #:    [id number]                  Weight:       105.0 lb Date of Birth:  1934/11/19                   BSA:          1.479 m Patient Age:    89 years                    BP:           159/68 mmHg Patient Gender: F                           HR:           80 bpm. Exam Location:  Inpatient Procedure: 2D Echo, 3D Echo, Cardiac Doppler and Color Doppler STAT ECHO REPORT CONTAINS CRITICAL RESULT Indications:    Z01.818 Encounter for other preprocedural examination  History:        Patient has prior history of Echocardiogram examinations, most                 recent 01/12/2020. CAD, Abnormal ECG, Aortic Valve Disease;                  Arrythmias:A TACH and LBBB. Ascending aortic aneurysm. AI.  Sonographer:    Roseanna Rainbow RDCS Referring Phys: [place]  Sonographer Comments: Technically difficult study due to poor echo windows. Patient with hip fracture. Could not turn. IMPRESSIONS  1. Aortic dilatation noted. There is severe dilatation of the ascending aorta, measuring 60 mm.  2. Aortic regurgitation mechanism appears to be from annular dilation. There is a largely central jet. The aortic valve is tricuspid. There is moderate calcification of the aortic valve. Aortic valve regurgitation is moderate to severe. Mild aortic valve stenosis. Aortic valve mean gradient measures 7.0 mmHg.  3. Left ventricular ejection fraction, by estimation, is 55%. The left ventricle has low normal function. The left ventricle has no regional wall motion abnormalities. There is moderate asymmetric left ventricular hypertrophy of the septal segment. Left  ventricular diastolic parameters are indeterminate. Elevated left atrial pressure.  4. Right ventricular systolic function is normal. The right ventricular size is normal. There is mildly elevated pulmonary artery systolic pressure. The estimated right ventricular systolic pressure is [id number] mmHg.  5. 2D MVA 2.89 cm2, continuity equation defered in the setting of AI; suspect mild mitral stenosis. The mitral valve is degenerative. No evidence of mitral valve regurgitation. The mean mitral valve gradient is 4.0 mmHg.  6. The inferior vena cava is dilated in size with <50% respiratory variability, suggesting right atrial pressure of 15 mmHg. Comparison(s): Prior images reviewed side by side. Aortic maximum dimension has increased from prior; recommend clinical correlation with recent CT aorta protocol as oblique echo measurements can lead to over-estimation. Conclusion(s)/Recommendation(s): Reaching out to inpatient cardioogy team. FINDINGS  Left Ventricle: Left ventricular ejection fraction, by  estimation, is 55%. The left ventricle has low normal function. The left ventricle has no regional wall motion abnormalities. The left ventricular internal cavity size was normal in size. There is moderate asymmetric left ventricular hypertrophy of the septal segment. Left ventricular diastolic function could not be evaluated due to mitral annular calcification (moderate or greater). Left ventricular diastolic parameters are indeterminate. Elevated left atrial pressure. Right Ventricle: The right ventricular size is normal. No increase in right ventricular wall thickness. Right ventricular systolic function is normal. There is mildly elevated pulmonary artery systolic pressure. The tricuspid regurgitant velocity is 2.39  m/s, and with an assumed right atrial pressure of 15 mmHg, the estimated right ventricular systolic pressure is [id number] mmHg. Left Atrium: Left atrial size was normal in size. Right Atrium: Right atrial size was normal in size. Pericardium: Trivial pericardial effusion is present. The pericardial effusion is posterior to the left ventricle. Mitral Valve: 2D MVA 2.89 cm2, continuity equation defered in the setting of AI; suspect mild mitral stenosis. The mitral valve is degenerative in appearance. No evidence of mitral valve regurgitation. MV peak gradient, 8.6 mmHg. The mean mitral valve gradient is 4.0 mmHg with average heart rate of 82 bpm. Tricuspid Valve: The tricuspid valve is not well visualized. Tricuspid valve regurgitation is trivial. No evidence of tricuspid stenosis. Aortic Valve: Aortic regurgitation mechanism appears to be from annular dilation. There is a largely central jet. The aortic valve is tricuspid. There is moderate calcification of the aortic valve. Aortic valve regurgitation is moderate to severe. Aortic  regurgitation PHT measures 529 msec. Mild aortic stenosis is present. Aortic valve mean gradient measures 7.0 mmHg. Aortic valve peak gradient measures 12.5 mmHg. Aortic valve  area, by VTI measures 3.12 cm. Pulmonic Valve: The pulmonic valve was not well visualized. Pulmonic valve regurgitation is mild to moderate. No evidence of pulmonic stenosis. Aorta: Aortic dilatation noted. There is severe dilatation of the ascending aorta, measuring 60 mm. Venous: The inferior vena cava is dilated in size with less than 50% respiratory variability, suggesting right atrial pressure of 15 mmHg. IAS/Shunts: The interatrial septum was not well visualized.  LEFT VENTRICLE PLAX 2D LVIDd:         3.70 cm      Diastology LVIDs:         2.70 cm      LV e' medial:    4.46 cm/s LV PW:  1.40 cm      LV E/e' medial:  21.9 LV IVS:        1.30 cm      LV e' lateral:   9.03 cm/s LVOT diam:     2.40 cm      LV E/e' lateral: 10.8 LV SV:         105 LV SV Index:   71 LVOT Area:     4.52 cm  LV Volumes (MOD) LV vol d, MOD A2C: 96.4 ml LV vol d, MOD A4C: 138.0 ml LV vol s, MOD A2C: 39.2 ml LV vol s, MOD A4C: 72.7 ml LV SV MOD A2C:     57.2 ml LV SV MOD A4C:     138.0 ml LV SV MOD BP:      58.5 ml RIGHT VENTRICLE            IVC RV S prime:     5.66 cm/s  IVC diam: 2.20 cm TAPSE (M-mode): 1.3 cm LEFT ATRIUM             Index        RIGHT ATRIUM          Index LA diam:        2.90 cm 1.96 cm/m   RA Area:     4.90 cm LA Vol (A2C):   34.1 ml 23.05 ml/m  RA Volume:   6.28 ml  4.25 ml/m LA Vol (A4C):   32.1 ml 21.70 ml/m LA Biplane Vol: 36.1 ml 24.41 ml/m  AORTIC VALVE                     PULMONIC VALVE AV Area (Vmax):    3.14 cm      PR End Diast Vel: 1.90 msec AV Area (Vmean):   3.01 cm AV Area (VTI):     3.12 cm AV Vmax:           177.00 cm/s AV Vmean:          124.000 cm/s AV VTI:            0.335 m AV Peak Grad:      12.5 mmHg AV Mean Grad:      7.0 mmHg LVOT Vmax:         123.00 cm/s LVOT Vmean:        82.600 cm/s LVOT VTI:          0.231 m LVOT/AV VTI ratio: 0.69 AI PHT:            529 msec  AORTA Ao Root diam: 3.30 cm Ao Asc diam:  5.90 cm MITRAL VALVE                TRICUSPID VALVE MV Area (PHT):  5.54 cm     TR Peak grad:   22.8 mmHg MV Area VTI:   3.21 cm     TR Vmax:        239.00 cm/s MV Peak grad:  8.6 mmHg MV Mean grad:  4.0 mmHg     SHUNTS MV Vmax:       1.47 m/s     Systemic VTI:  0.23 m MV Vmean:      93.6 cm/s    Systemic Diam: 2.40 cm MV Decel Time: 137 msec MV E velocity: 97.50 cm/s MV A velocity: 126.00 cm/s MV E/A ratio:  0.77 Rudean Haskell MD Electronically signed by Rudean Haskell MD Signature Date/Time: 04/03/2022/10:20:28 AM  Final    CT CERVICAL SPINE WO CONTRAST  Result Date: 04/02/2022 CLINICAL DATA:  [place] from chair EXAM: CT CERVICAL SPINE WITHOUT CONTRAST TECHNIQUE: Multidetector CT imaging of the cervical spine was performed without intravenous contrast. Multiplanar CT image reconstructions were also generated. RADIATION DOSE REDUCTION: This exam was performed according to the departmental dose-optimization program which includes automated exposure control, adjustment of the mA and/or kV according to patient size and/or use of iterative reconstruction technique. COMPARISON:  02/23/2021 FINDINGS: Alignment: Stable mild degenerative anterolisthesis of C7 on T1. Otherwise alignment is grossly anatomic. Skull base and vertebrae: No acute fracture. No primary bone lesion or focal pathologic process. Soft tissues and spinal canal: No prevertebral fluid or swelling. No visible canal hematoma. Disc levels: Prior ACDF spanning C4 through C7. There is mild diffuse spondylosis and facet hypertrophy throughout the nonsurgical levels. No change since prior exam. Upper chest: Airway is patent. Lung apices are clear. Known thyroid goiter, previously evaluated by ultrasound and biopsy. Other: Reconstructed images demonstrate no additional findings. IMPRESSION: 1. No acute cervical spine fracture. 2. Stable postsurgical and degenerative changes of the cervical spine. Electronically Signed   By: Randa Ngo M.D.   On: 04/02/2022 19:28   CT HEAD WO CONTRAST  Result Date:  04/02/2022 CLINICAL DATA:  [place] from chair EXAM: CT HEAD WITHOUT CONTRAST TECHNIQUE: Contiguous axial images were obtained from the base of the skull through the vertex without intravenous contrast. RADIATION DOSE REDUCTION: This exam was performed according to the departmental dose-optimization program which includes automated exposure control, adjustment of the mA and/or kV according to patient size and/or use of iterative reconstruction technique. COMPARISON:  02/23/2021 FINDINGS: Brain: Chronic small vessel ischemic changes within the white matter and bilateral basal ganglia. No acute infarct or hemorrhage. Lateral ventricles and midline structures are unremarkable. No acute extra-axial fluid collections. No mass effect. Vascular: No hyperdense vessel or unexpected calcification. Skull: Normal. Negative for fracture or focal lesion. Sinuses/Orbits: Chronic left sphenoid sinus disease. The remaining paranasal sinuses are clear. Other: None. IMPRESSION: 1. No acute intracranial process. Electronically Signed   By: Randa Ngo M.D.   On: 04/02/2022 19:25   DG Chest Port 1 View  Result Date: 04/02/2022 CLINICAL DATA:  Pain, left hip fracture, preoperative evaluation EXAM: PORTABLE CHEST 1 VIEW COMPARISON:  11/05/2021, 10/04/2021 FINDINGS: Single frontal view of the chest demonstrates stable enlargement of the cardiac silhouette. Dilated ectatic thoracic aorta compatible with known ascending thoracic aortic aneurysm. Increased density in the right paratracheal region consistent with vascular shadow and thyroid. No acute airspace disease, effusion, or pneumothorax. No acute bony abnormality. IMPRESSION: 1. Continued dilation and ectasia of the thoracic aorta consistent with known thoracic aortic aneurysm. 2. No acute airspace disease. Electronically Signed   By: Randa Ngo M.D.   On: 04/02/2022 18:54   DG Hip Unilat With Pelvis 2-3 Views Left  Result Date: 04/02/2022 CLINICAL DATA:  Pain after fall  EXAM: DG HIP (WITH OR WITHOUT PELVIS) 3V LEFT COMPARISON:  None Available. FINDINGS: Portable x-rays. Left femoral neck fracture with displacement, foreshortening and angulation. Underlying osteopenia. Concentric joint space loss of both hips. Degenerative changes. Foley catheter. IMPRESSION: Displaced subcapital femoral neck fracture. Displaced, impacted femoral neck fracture on the left Electronically Signed   By: Jill Side M.D.   On: 04/02/2022 17:23    Assessment/Plan 1. S/P total left hip arthroplasty Pain Controlled On Norco WBAT Walking well with her walker Baby aspirin for Dvt prophylaxis   2. MCI (mild cognitive impairment)  MMSE 28/30 Passed Clock drawing On Exelon  Working with [place]   3. Weight loss Stabilized for now Appetite is good  4. Primary insomnia On Remeron  5. Mixed hyperlipidemia Statin  6. Aneurysm of ascending aorta without rupture [facility]) Follows with Dr Caffie Pinto  7. Gastroesophageal reflux disease without esophagitis On Prilosec  8. Multinodular goiter (nontoxic) Was on Methimazole Not anymore Will need follow up of her Thyroid    Family/ staff Communication:   Labs/tests ordered:

## 2022-04-10 DIAGNOSIS — Z96642 Presence of left artificial hip joint: Secondary | ICD-10-CM | POA: Diagnosis not present

## 2022-04-10 DIAGNOSIS — R2689 Other abnormalities of gait and mobility: Secondary | ICD-10-CM | POA: Diagnosis not present

## 2022-04-10 DIAGNOSIS — M25552 Pain in left hip: Secondary | ICD-10-CM | POA: Diagnosis not present

## 2022-04-10 DIAGNOSIS — M25652 Stiffness of left hip, not elsewhere classified: Secondary | ICD-10-CM | POA: Diagnosis not present

## 2022-04-10 DIAGNOSIS — M6389 Disorders of muscle in diseases classified elsewhere, multiple sites: Secondary | ICD-10-CM | POA: Diagnosis not present

## 2022-04-10 DIAGNOSIS — R278 Other lack of coordination: Secondary | ICD-10-CM | POA: Diagnosis not present

## 2022-04-10 DIAGNOSIS — R413 Other amnesia: Secondary | ICD-10-CM | POA: Diagnosis not present

## 2022-04-11 DIAGNOSIS — R278 Other lack of coordination: Secondary | ICD-10-CM | POA: Diagnosis not present

## 2022-04-11 DIAGNOSIS — Z96642 Presence of left artificial hip joint: Secondary | ICD-10-CM | POA: Diagnosis not present

## 2022-04-11 DIAGNOSIS — R413 Other amnesia: Secondary | ICD-10-CM | POA: Diagnosis not present

## 2022-04-11 DIAGNOSIS — M25552 Pain in left hip: Secondary | ICD-10-CM | POA: Diagnosis not present

## 2022-04-12 DIAGNOSIS — M25652 Stiffness of left hip, not elsewhere classified: Secondary | ICD-10-CM | POA: Diagnosis not present

## 2022-04-12 DIAGNOSIS — M6389 Disorders of muscle in diseases classified elsewhere, multiple sites: Secondary | ICD-10-CM | POA: Diagnosis not present

## 2022-04-12 DIAGNOSIS — I10 Essential (primary) hypertension: Secondary | ICD-10-CM | POA: Diagnosis not present

## 2022-04-12 DIAGNOSIS — R2689 Other abnormalities of gait and mobility: Secondary | ICD-10-CM | POA: Diagnosis not present

## 2022-04-12 DIAGNOSIS — M25552 Pain in left hip: Secondary | ICD-10-CM | POA: Diagnosis not present

## 2022-04-12 DIAGNOSIS — R413 Other amnesia: Secondary | ICD-10-CM | POA: Diagnosis not present

## 2022-04-12 DIAGNOSIS — R278 Other lack of coordination: Secondary | ICD-10-CM | POA: Diagnosis not present

## 2022-04-12 DIAGNOSIS — Z96642 Presence of left artificial hip joint: Secondary | ICD-10-CM | POA: Diagnosis not present

## 2022-04-13 DIAGNOSIS — M6389 Disorders of muscle in diseases classified elsewhere, multiple sites: Secondary | ICD-10-CM | POA: Diagnosis not present

## 2022-04-13 DIAGNOSIS — R278 Other lack of coordination: Secondary | ICD-10-CM | POA: Diagnosis not present

## 2022-04-13 DIAGNOSIS — M25552 Pain in left hip: Secondary | ICD-10-CM | POA: Diagnosis not present

## 2022-04-13 DIAGNOSIS — R413 Other amnesia: Secondary | ICD-10-CM | POA: Diagnosis not present

## 2022-04-13 DIAGNOSIS — Z96642 Presence of left artificial hip joint: Secondary | ICD-10-CM | POA: Diagnosis not present

## 2022-04-13 DIAGNOSIS — R2689 Other abnormalities of gait and mobility: Secondary | ICD-10-CM | POA: Diagnosis not present

## 2022-04-13 DIAGNOSIS — M25652 Stiffness of left hip, not elsewhere classified: Secondary | ICD-10-CM | POA: Diagnosis not present

## 2022-04-16 ENCOUNTER — Encounter: Payer: Medicare Other | Admitting: Adult Health

## 2022-04-16 ENCOUNTER — Other Ambulatory Visit: Payer: Self-pay | Admitting: Adult Health

## 2022-04-16 DIAGNOSIS — R2689 Other abnormalities of gait and mobility: Secondary | ICD-10-CM | POA: Diagnosis not present

## 2022-04-16 DIAGNOSIS — M6389 Disorders of muscle in diseases classified elsewhere, multiple sites: Secondary | ICD-10-CM | POA: Diagnosis not present

## 2022-04-16 DIAGNOSIS — R278 Other lack of coordination: Secondary | ICD-10-CM | POA: Diagnosis not present

## 2022-04-16 DIAGNOSIS — Z96642 Presence of left artificial hip joint: Secondary | ICD-10-CM | POA: Diagnosis not present

## 2022-04-16 DIAGNOSIS — M25652 Stiffness of left hip, not elsewhere classified: Secondary | ICD-10-CM | POA: Diagnosis not present

## 2022-04-16 DIAGNOSIS — M25552 Pain in left hip: Secondary | ICD-10-CM | POA: Diagnosis not present

## 2022-04-16 MED ORDER — HYDROCODONE-ACETAMINOPHEN 5-325 MG PO TABS: 1.0000 | ORAL_TABLET | Freq: Four times a day (QID) | ORAL | 0 refills | Status: DC | PRN

## 2022-04-17 DIAGNOSIS — R2689 Other abnormalities of gait and mobility: Secondary | ICD-10-CM | POA: Diagnosis not present

## 2022-04-17 DIAGNOSIS — M25552 Pain in left hip: Secondary | ICD-10-CM | POA: Diagnosis not present

## 2022-04-17 DIAGNOSIS — R413 Other amnesia: Secondary | ICD-10-CM | POA: Diagnosis not present

## 2022-04-17 DIAGNOSIS — M25652 Stiffness of left hip, not elsewhere classified: Secondary | ICD-10-CM | POA: Diagnosis not present

## 2022-04-17 DIAGNOSIS — Z96642 Presence of left artificial hip joint: Secondary | ICD-10-CM | POA: Diagnosis not present

## 2022-04-18 DIAGNOSIS — R2689 Other abnormalities of gait and mobility: Secondary | ICD-10-CM | POA: Diagnosis not present

## 2022-04-18 DIAGNOSIS — M6389 Disorders of muscle in diseases classified elsewhere, multiple sites: Secondary | ICD-10-CM | POA: Diagnosis not present

## 2022-04-18 DIAGNOSIS — Z96642 Presence of left artificial hip joint: Secondary | ICD-10-CM | POA: Diagnosis not present

## 2022-04-18 DIAGNOSIS — R278 Other lack of coordination: Secondary | ICD-10-CM | POA: Diagnosis not present

## 2022-04-18 DIAGNOSIS — R413 Other amnesia: Secondary | ICD-10-CM | POA: Diagnosis not present

## 2022-04-18 DIAGNOSIS — S72042D Displaced fracture of base of neck of left femur, subsequent encounter for closed fracture with routine healing: Secondary | ICD-10-CM | POA: Diagnosis not present

## 2022-04-18 DIAGNOSIS — M25652 Stiffness of left hip, not elsewhere classified: Secondary | ICD-10-CM | POA: Diagnosis not present

## 2022-04-18 DIAGNOSIS — M25552 Pain in left hip: Secondary | ICD-10-CM | POA: Diagnosis not present

## 2022-04-19 DIAGNOSIS — R278 Other lack of coordination: Secondary | ICD-10-CM | POA: Diagnosis not present

## 2022-04-19 DIAGNOSIS — M25652 Stiffness of left hip, not elsewhere classified: Secondary | ICD-10-CM | POA: Diagnosis not present

## 2022-04-19 DIAGNOSIS — R413 Other amnesia: Secondary | ICD-10-CM | POA: Diagnosis not present

## 2022-04-19 DIAGNOSIS — M6389 Disorders of muscle in diseases classified elsewhere, multiple sites: Secondary | ICD-10-CM | POA: Diagnosis not present

## 2022-04-19 DIAGNOSIS — M25552 Pain in left hip: Secondary | ICD-10-CM | POA: Diagnosis not present

## 2022-04-19 DIAGNOSIS — R2689 Other abnormalities of gait and mobility: Secondary | ICD-10-CM | POA: Diagnosis not present

## 2022-04-19 DIAGNOSIS — Z96642 Presence of left artificial hip joint: Secondary | ICD-10-CM | POA: Diagnosis not present

## 2022-04-20 DIAGNOSIS — R413 Other amnesia: Secondary | ICD-10-CM | POA: Diagnosis not present

## 2022-04-20 DIAGNOSIS — M6389 Disorders of muscle in diseases classified elsewhere, multiple sites: Secondary | ICD-10-CM | POA: Diagnosis not present

## 2022-04-20 DIAGNOSIS — Z96642 Presence of left artificial hip joint: Secondary | ICD-10-CM | POA: Diagnosis not present

## 2022-04-20 DIAGNOSIS — R278 Other lack of coordination: Secondary | ICD-10-CM | POA: Diagnosis not present

## 2022-04-20 DIAGNOSIS — M25552 Pain in left hip: Secondary | ICD-10-CM | POA: Diagnosis not present

## 2022-04-20 DIAGNOSIS — R2689 Other abnormalities of gait and mobility: Secondary | ICD-10-CM | POA: Diagnosis not present

## 2022-04-20 DIAGNOSIS — M25652 Stiffness of left hip, not elsewhere classified: Secondary | ICD-10-CM | POA: Diagnosis not present

## 2022-04-21 DIAGNOSIS — Z96642 Presence of left artificial hip joint: Secondary | ICD-10-CM | POA: Diagnosis not present

## 2022-04-21 DIAGNOSIS — M6389 Disorders of muscle in diseases classified elsewhere, multiple sites: Secondary | ICD-10-CM | POA: Diagnosis not present

## 2022-04-21 DIAGNOSIS — R278 Other lack of coordination: Secondary | ICD-10-CM | POA: Diagnosis not present

## 2022-04-23 DIAGNOSIS — R278 Other lack of coordination: Secondary | ICD-10-CM | POA: Diagnosis not present

## 2022-04-23 DIAGNOSIS — R2689 Other abnormalities of gait and mobility: Secondary | ICD-10-CM | POA: Diagnosis not present

## 2022-04-23 DIAGNOSIS — M25652 Stiffness of left hip, not elsewhere classified: Secondary | ICD-10-CM | POA: Diagnosis not present

## 2022-04-23 DIAGNOSIS — M25552 Pain in left hip: Secondary | ICD-10-CM | POA: Diagnosis not present

## 2022-04-23 DIAGNOSIS — M6389 Disorders of muscle in diseases classified elsewhere, multiple sites: Secondary | ICD-10-CM | POA: Diagnosis not present

## 2022-04-23 DIAGNOSIS — Z96642 Presence of left artificial hip joint: Secondary | ICD-10-CM | POA: Diagnosis not present

## 2022-04-24 DIAGNOSIS — M25652 Stiffness of left hip, not elsewhere classified: Secondary | ICD-10-CM | POA: Diagnosis not present

## 2022-04-24 DIAGNOSIS — M6389 Disorders of muscle in diseases classified elsewhere, multiple sites: Secondary | ICD-10-CM | POA: Diagnosis not present

## 2022-04-24 DIAGNOSIS — Z96642 Presence of left artificial hip joint: Secondary | ICD-10-CM | POA: Diagnosis not present

## 2022-04-24 DIAGNOSIS — R278 Other lack of coordination: Secondary | ICD-10-CM | POA: Diagnosis not present

## 2022-04-24 DIAGNOSIS — R2689 Other abnormalities of gait and mobility: Secondary | ICD-10-CM | POA: Diagnosis not present

## 2022-04-24 DIAGNOSIS — M25552 Pain in left hip: Secondary | ICD-10-CM | POA: Diagnosis not present

## 2022-04-25 ENCOUNTER — Encounter: Payer: Self-pay | Admitting: Cardiology

## 2022-04-25 ENCOUNTER — Ambulatory Visit: Payer: Medicare Other | Attending: Cardiology | Admitting: Cardiology

## 2022-04-25 VITALS — BP 100/70 | HR 120 | Ht 63.5 in | Wt 109.0 lb

## 2022-04-25 DIAGNOSIS — M6389 Disorders of muscle in diseases classified elsewhere, multiple sites: Secondary | ICD-10-CM | POA: Diagnosis not present

## 2022-04-25 DIAGNOSIS — I2510 Atherosclerotic heart disease of native coronary artery without angina pectoris: Secondary | ICD-10-CM | POA: Diagnosis not present

## 2022-04-25 DIAGNOSIS — I7121 Aneurysm of the ascending aorta, without rupture: Secondary | ICD-10-CM | POA: Diagnosis not present

## 2022-04-25 DIAGNOSIS — I700 Atherosclerosis of aorta: Secondary | ICD-10-CM

## 2022-04-25 DIAGNOSIS — I2584 Coronary atherosclerosis due to calcified coronary lesion: Secondary | ICD-10-CM

## 2022-04-25 DIAGNOSIS — M25652 Stiffness of left hip, not elsewhere classified: Secondary | ICD-10-CM | POA: Diagnosis not present

## 2022-04-25 DIAGNOSIS — Z96642 Presence of left artificial hip joint: Secondary | ICD-10-CM | POA: Diagnosis not present

## 2022-04-25 DIAGNOSIS — R2689 Other abnormalities of gait and mobility: Secondary | ICD-10-CM | POA: Diagnosis not present

## 2022-04-25 DIAGNOSIS — R278 Other lack of coordination: Secondary | ICD-10-CM | POA: Diagnosis not present

## 2022-04-25 DIAGNOSIS — M25552 Pain in left hip: Secondary | ICD-10-CM | POA: Diagnosis not present

## 2022-04-25 DIAGNOSIS — R413 Other amnesia: Secondary | ICD-10-CM | POA: Diagnosis not present

## 2022-04-25 NOTE — Progress Notes (Signed)
Cardiology Office Note:    Date:  04/26/2022   ID:  Janet Wilson, DOB Aug 08, 1934, MRN [id number]  PCP:  Virgie Dad, MD   [place]  Cardiologist:  Candee Furbish, MD  Advanced Practice Provider:  No care team member to display Electrophysiologist:  None       Referring MD: Josetta Huddle, MD    History of Present Illness:    Janet Wilson is a 88 y.o. female here for follow-up of ascending aortic aneurysm previously 55-60 mm with coronary atherosclerosis.  Left bundle branch block.  Previously sought the opinion of Dr. Ysidro Evert at [facility] cardiothoracic surgery.  She had seen Dr. Cyndia Bent here.  Ultimately, she and her family decided not to proceed with surgical repair of aorta.  Mother died age 37 from MI, strong family history of CAD  In 2018 had an ectopic atrial tachycardia  Event monitor previously had shown short runs of SVT ectopic atrial tachycardia.  No evidence of atrial fibrillation.  Has had occasional short-term memory difficulty with word finding.  She is currently moving from independent living to assisted living.  [facility].  She is undergoing rehabilitation for left hip replacement.  She underwent total hip arthroplasty on 04/03/2022 after fracture.  Past Medical History:  Diagnosis Date   Aortic aneurysm (HCC)    Arthritis    OA BOTH KNEES AND HANDS   Atrial tachycardia    HX OF   GERD (gastroesophageal reflux disease)    Glaucoma    left eye   Goiter    CAUSING COUGH, HOARSINESS AND DRY THROAT   Headache(784.0)    MIGRAINES    Hyperlipidemia    Neuralgia    Overactive bladder    RLS (restless legs syndrome)    Temporal arteritis (HCC)     Past Surgical History:  Procedure Laterality Date   ARTERY BIOPSY Right 07/21/2012   Procedure: BIOPSY TEMPORAL ARTERY;  Surgeon: Haywood Lasso, MD;  Location: [place];  Service: General;  Laterality: Right;   CERVICAL FUSION  2012   Dr. Vertell Limber    EYE SURGERY     CATARACT EXTRACTION- BILATERAL   INCONTINENCE SURGERY     JOINT REPLACEMENT     right knee   LEFT SHOULDER SURGERY     RIGHT KNEE ARTHROSCOPY  11/2009     ROBOTIC ASSISTED BILATERAL SALPINGO OOPHERECTOMY Bilateral 12/08/2013   Procedure: ROBOTIC ASSISTED LAPAROSCOPIC BILATERAL SALPINGO OOPHORECTOMY WITH STAGING;  Surgeon: Everitt Amber, MD;  Location: [facility];  Service: Gynecology;  Laterality: Bilateral;   TOTAL HIP ARTHROPLASTY Left 04/03/2022   Procedure: TOTAL HIP ARTHROPLASTY ANTERIOR APPROACH;  Surgeon: Renette Butters, MD;  Location: [facility];  Service: Orthopedics;  Laterality: Left;   TOTAL KNEE ARTHROPLASTY  07/04/2011   Procedure: TOTAL KNEE ARTHROPLASTY;  Surgeon: Gearlean Alf, MD;  Location: [facility];  Service: Orthopedics;  Laterality: Right;    Current Medications: Current Meds  Medication Sig   aspirin EC 81 MG tablet Take 1 tablet (81 mg total) by mouth 2 (two) times daily. To prevent blood clots for 30 days after surgery.   brimonidine (ALPHAGAN) 0.2 % ophthalmic solution Place 1 drop into both eyes 2 (two) times daily.   Calcium Carbonate-Vitamin D (CALCIUM PLUS VITAMIN D PO) Take 1 tablet by mouth 2 (two) times a week.   Coenzyme Q10 (CO Q 10) 100 MG CAPS Take 100 mg by mouth 2 (two) times a week.   HYDROcodone-acetaminophen (NORCO) 5-325 MG  tablet Take 1-2 tablets by mouth every 6 (six) hours as needed for severe pain. MAXIMUM TOTAL ACETAMINOPHEN DOSE IS 4000 MG PER DAY   mirtazapine (REMERON) 7.5 MG tablet TAKE 1 TABLET BY MOUTH AT BEDTIME.   Omega-3 Fatty Acids (FISH OIL) 1200 MG CAPS Take 1,200 mg by mouth 2 (two) times a week.   omeprazole (PRILOSEC) 40 MG capsule TAKE 1 CAPSULE (40 MG TOTAL) BY MOUTH DAILY.   polyethylene glycol (MIRALAX / GLYCOLAX) 17 g packet Take 17 g by mouth daily.   rivastigmine (EXELON) 1.5 MG capsule TAKE 1 CAPSULE EACH MORNING.   rivastigmine (EXELON) 3 MG capsule TAKE 1 CAPSULE (3 MG TOTAL) BY MOUTH AT BEDTIME.    rosuvastatin (CRESTOR) 10 MG tablet TAKE 1 TABLET BY MOUTH EVERY DAY   senna-docusate (SENOKOT-S) 8.6-50 MG tablet Take 1 tablet by mouth 2 (two) times daily.   SYSTANE ULTRA PF 0.4-0.3 % SOLN Apply 1 drop to eye 3 (three) times daily as needed (for dryness).     Allergies:   Lisinopril, Mold extract [trichophyton], Molds & smuts, Sulfa antibiotics, Codeine, and Oxycodone   Social History   Socioeconomic History   Marital status: Married    Spouse name: [name]   Number of children: 2   Years of education: college   Highest education level: Not on file  Occupational History   Occupation: retired    Fish farm manager: RETIRED  Tobacco Use   Smoking status: Never   Smokeless tobacco: Never  Vaping Use   Vaping Use: Never used  Substance and Sexual Activity   Alcohol use: Yes    Alcohol/week: 1.0 standard drink of alcohol    Types: 1 Glasses of wine per week    Comment: SELDOM, very little   Drug use: No   Sexual activity: Yes  Other Topics Concern   Not on file  Social History Narrative   Pt lives at home with family.   Caffeine Use: Very little.    Patient is right handed       Social History      Diet? No       Do you drink/eat things with caffeine? Some coffee      Marital status?    Married                                What year were you married? 1961      Do you live in a house, apartment, assisted living, condo, trailer, etc.?  [facility]       Is it one or more stories? one      How many persons live in your home? two      Do you have any pets in your home? No       Highest level of education completed? 14 years       Current or past profession:  Chemical engineer       Do you exercise?              Yes                        Type & how often? Water aerobic and balance class      Advanced Directives      Do you have a living will?  yes      Do you have a DNR form?        yes  If not, do you want to discuss one?      Do you have signed  POA/HPOA for forms? yes      Functional Status      Do you have difficulty bathing or dressing yourself?      Do you have difficulty preparing food or eating?       Do you have difficulty managing your medications?      Do you have difficulty managing your finances?      Do you have difficulty affording your medications?   Social Determinants of Health   Financial Resource Strain: Not on file  Food Insecurity: No Food Insecurity (04/02/2022)   Hunger Vital Sign    Worried About Running Out of Food in the Last Year: Never true    Ran Out of Food in the Last Year: Never true  Transportation Needs: No Transportation Needs (04/02/2022)   PRAPARE - Hydrologist (Medical): No    Lack of Transportation (Non-Medical): No  Physical Activity: Not on file  Stress: Not on file  Social Connections: Not on file     Family History: The patient's family history includes Alzheimer's disease in her mother; Heart attack in her father.  ROS:   Please see the history of present illness.     All other systems reviewed and are negative.  EKGs/Labs/Other Studies Reviewed:    The following studies were reviewed today:  Cardiac Studies & Procedures     STRESS TESTS  NM MYOCAR MULTI W/SPECT W 08/12/2011  Narrative *RADIOLOGY REPORT*  Clinical Data:  Bundle branch block.  Coronary artery calcifications identified on CT.  Technique:  Standard myocardial SPECT imaging performed after resting intravenous injection of 10 mCi Tc-84mtetrofosmin . Subsequently, intravenous infusion of Lexiscan performed under the supervision of the Cardiology staff.  At peak effect of the drug, 30 mCi Tc-975mtrofosmin injected intravenously and standard myocardial SPECT imaging performed.  Quantitative gated imaging also performed to evaluate left ventricular wall motion and estimate left ventricular ejection fraction.  Comparison:  08/10/2011  MYOCARDIAL IMAGING WITH SPECT  (REST AND PHARMACOLOGIC-STRESS)  Findings:  No abnormal areas of myocardial reversibility identified on the stress images.  The summed difference score is equal to zero.  GATED LEFT VENTRICULAR WALL MOTION STUDY  Findings:  Review of the gated images demonstrates normal left ventricular wall motion and thickening.  LEFT VENTRICULAR EJECTION FRACTION  Findings:  QGS ejection fraction measures 78% , with an end- diastolic volume of 43 ml and an end-systolic volume of 9.0 ml.  IMPRESSION:  1.  No evidence for pharmacologically induced myocardial ischemia. The summed difference score is equal to zero.  2.  Left ventricular ejection fraction equals 78%. 3.  Normal left ventricular systolic function.  Original Report Authenticated By: TAAngelita InglesM.D.   ECHOCARDIOGRAM  ECHOCARDIOGRAM COMPLETE 04/03/2022  Narrative ECHOCARDIOGRAM REPORT    Patient Name:   ELEnid Baasate of Exam: 04/03/2022 Medical Rec #:  [id number]                 Height:       63.5 in Accession #:    [id number]                Weight:       105.0 lb Date of Birth:  1/December 08, 1934                 BSA:  1.479 m Patient Age:    42 years                    BP:           159/68 mmHg Patient Gender: F                           HR:           80 bpm. Exam Location:  Inpatient  Procedure: 2D Echo, 3D Echo, Cardiac Doppler and Color Doppler  STAT ECHO  REPORT CONTAINS CRITICAL RESULT  Indications:    Z01.818 Encounter for other preprocedural examination  History:        Patient has prior history of Echocardiogram examinations, most recent 01/12/2020. CAD, Abnormal ECG, Aortic Valve Disease; Arrythmias:A TACH and LBBB. Ascending aortic aneurysm. AI.  Sonographer:    Roseanna Rainbow RDCS Referring Phys: Lorenzo   Sonographer Comments: Technically difficult study due to poor echo windows. Patient with hip fracture. Could not turn. IMPRESSIONS   1. Aortic dilatation  noted. There is severe dilatation of the ascending aorta, measuring 60 mm. 2. Aortic regurgitation mechanism appears to be from annular dilation. There is a largely central jet. The aortic valve is tricuspid. There is moderate calcification of the aortic valve. Aortic valve regurgitation is moderate to severe. Mild aortic valve stenosis. Aortic valve mean gradient measures 7.0 mmHg. 3. Left ventricular ejection fraction, by estimation, is 55%. The left ventricle has low normal function. The left ventricle has no regional wall motion abnormalities. There is moderate asymmetric left ventricular hypertrophy of the septal segment. Left ventricular diastolic parameters are indeterminate. Elevated left atrial pressure. 4. Right ventricular systolic function is normal. The right ventricular size is normal. There is mildly elevated pulmonary artery systolic pressure. The estimated right ventricular systolic pressure is [id number] mmHg. 5. 2D MVA 2.89 cm2, continuity equation defered in the setting of AI; suspect mild mitral stenosis. The mitral valve is degenerative. No evidence of mitral valve regurgitation. The mean mitral valve gradient is 4.0 mmHg. 6. The inferior vena cava is dilated in size with <50% respiratory variability, suggesting right atrial pressure of 15 mmHg.  Comparison(s): Prior images reviewed side by side. Aortic maximum dimension has increased from prior; recommend clinical correlation with recent CT aorta protocol as oblique echo measurements can lead to over-estimation.  Conclusion(s)/Recommendation(s): Reaching out to inpatient cardioogy team.  FINDINGS Left Ventricle: Left ventricular ejection fraction, by estimation, is 55%. The left ventricle has low normal function. The left ventricle has no regional wall motion abnormalities. The left ventricular internal cavity size was normal in size. There is moderate asymmetric left ventricular hypertrophy of the septal segment. Left ventricular  diastolic function could not be evaluated due to mitral annular calcification (moderate or greater). Left ventricular diastolic parameters are indeterminate. Elevated left atrial pressure.  Right Ventricle: The right ventricular size is normal. No increase in right ventricular wall thickness. Right ventricular systolic function is normal. There is mildly elevated pulmonary artery systolic pressure. The tricuspid regurgitant velocity is 2.39 m/s, and with an assumed right atrial pressure of 15 mmHg, the estimated right ventricular systolic pressure is [id number] mmHg.  Left Atrium: Left atrial size was normal in size.  Right Atrium: Right atrial size was normal in size.  Pericardium: Trivial pericardial effusion is present. The pericardial effusion is posterior to the left ventricle.  Mitral Valve: 2D MVA 2.89 cm2, continuity equation defered in the setting  of AI; suspect mild mitral stenosis. The mitral valve is degenerative in appearance. No evidence of mitral valve regurgitation. MV peak gradient, 8.6 mmHg. The mean mitral valve gradient is 4.0 mmHg with average heart rate of 82 bpm.  Tricuspid Valve: The tricuspid valve is not well visualized. Tricuspid valve regurgitation is trivial. No evidence of tricuspid stenosis.  Aortic Valve: Aortic regurgitation mechanism appears to be from annular dilation. There is a largely central jet. The aortic valve is tricuspid. There is moderate calcification of the aortic valve. Aortic valve regurgitation is moderate to severe. Aortic regurgitation PHT measures 529 msec. Mild aortic stenosis is present. Aortic valve mean gradient measures 7.0 mmHg. Aortic valve peak gradient measures 12.5 mmHg. Aortic valve area, by VTI measures 3.12 cm.  Pulmonic Valve: The pulmonic valve was not well visualized. Pulmonic valve regurgitation is mild to moderate. No evidence of pulmonic stenosis.  Aorta: Aortic dilatation noted. There is severe dilatation of the ascending  aorta, measuring 60 mm.  Venous: The inferior vena cava is dilated in size with less than 50% respiratory variability, suggesting right atrial pressure of 15 mmHg.  IAS/Shunts: The interatrial septum was not well visualized.   LEFT VENTRICLE PLAX 2D LVIDd:         3.70 cm      Diastology LVIDs:         2.70 cm      LV e' medial:    4.46 cm/s LV PW:         1.40 cm      LV E/e' medial:  21.9 LV IVS:        1.30 cm      LV e' lateral:   9.03 cm/s LVOT diam:     2.40 cm      LV E/e' lateral: 10.8 LV SV:         105 LV SV Index:   71 LVOT Area:     4.52 cm  LV Volumes (MOD) LV vol d, MOD A2C: 96.4 ml LV vol d, MOD A4C: 138.0 ml LV vol s, MOD A2C: 39.2 ml LV vol s, MOD A4C: 72.7 ml LV SV MOD A2C:     57.2 ml LV SV MOD A4C:     138.0 ml LV SV MOD BP:      58.5 ml  RIGHT VENTRICLE            IVC RV S prime:     5.66 cm/s  IVC diam: 2.20 cm TAPSE (M-mode): 1.3 cm  LEFT ATRIUM             Index        RIGHT ATRIUM          Index LA diam:        2.90 cm 1.96 cm/m   RA Area:     4.90 cm LA Vol (A2C):   34.1 ml 23.05 ml/m  RA Volume:   6.28 ml  4.25 ml/m LA Vol (A4C):   32.1 ml 21.70 ml/m LA Biplane Vol: 36.1 ml 24.41 ml/m AORTIC VALVE                     PULMONIC VALVE AV Area (Vmax):    3.14 cm      PR End Diast Vel: 1.90 msec AV Area (Vmean):   3.01 cm AV Area (VTI):     3.12 cm AV Vmax:           177.00 cm/s AV Vmean:  124.000 cm/s AV VTI:            0.335 m AV Peak Grad:      12.5 mmHg AV Mean Grad:      7.0 mmHg LVOT Vmax:         123.00 cm/s LVOT Vmean:        82.600 cm/s LVOT VTI:          0.231 m LVOT/AV VTI ratio: 0.69 AI PHT:            529 msec  AORTA Ao Root diam: 3.30 cm Ao Asc diam:  5.90 cm  MITRAL VALVE                TRICUSPID VALVE MV Area (PHT): 5.54 cm     TR Peak grad:   22.8 mmHg MV Area VTI:   3.21 cm     TR Vmax:        239.00 cm/s MV Peak grad:  8.6 mmHg MV Mean grad:  4.0 mmHg     SHUNTS MV Vmax:       1.47 m/s      Systemic VTI:  0.23 m MV Vmean:      93.6 cm/s    Systemic Diam: 2.40 cm MV Decel Time: 137 msec MV E velocity: 97.50 cm/s MV A velocity: 126.00 cm/s MV E/A ratio:  0.77  Rudean Haskell MD Electronically signed by Rudean Haskell MD Signature Date/Time: 04/03/2022/10:20:28 AM    Final              EKG: Prior EKG demonstrates sinus tachycardia 107 left bundle branch block, during exam, heart rate decreased.  Recent Labs: 04/02/2022: ALT 20; Magnesium 1.9 04/03/2022: TSH 0.281 04/04/2022: BUN 20; Creatinine, Ser 0.68; Hemoglobin 12.3; Platelets 144; Potassium 3.7; Sodium 138  Recent Lipid Panel    Component Value Date/Time   CHOL 134 03/18/2020 0745   TRIG 39 03/18/2020 0745   HDL 67 03/18/2020 0745   CHOLHDL 2.0 03/18/2020 0745   LDLCALC 57 03/18/2020 0745     Risk Assessment/Calculations:      Physical Exam:    VS:  BP 100/70 (BP Location: Left Arm, Patient Position: Sitting, Cuff Size: Normal)   Pulse (!) 120   Ht 5' 3.5" (1.613 m)   Wt 109 lb (49.4 kg)   LMP  (LMP Unknown)   SpO2 95%   BMI 19.01 kg/m     Wt Readings from Last 3 Encounters:  04/25/22 109 lb (49.4 kg)  04/06/22 111 lb (50.3 kg)  04/03/22 101 lb (45.8 kg)     GEN: Thin, in no acute distress HEENT: normal Neck: no JVD, carotid bruits, or masses Cardiac: RRR; 1/6 DM,no rubs, or gallops,no edema  Respiratory:  clear to auscultation bilaterally, normal work of breathing GI: soft, nontender, nondistended, + BS MS: no deformity or atrophy Skin: warm and dry, no rash Neuro:  Alert and Oriented x 3, Strength and sensation are intact Psych: euthymic mood, full affect   ASSESSMENT:    1. Aneurysm of ascending aorta without rupture ([place])   2. Coronary artery calcification   3. Aortic atherosclerosis (HCC)     PLAN:    In order of problems listed above:  Ascending aortic aneurysm - 55 mm on both echocardiogram as well as CT scan previously done in January 2022 --57 mm on CT  09/2021 --60 mm on ECHO 03/2022 - Appreciate cardiothoracic surgery evaluation both at [facility] and here with Dr. Cyndia Bent.  Clearly would be  a high risk procedure.  Surgery would be ascending Hemi arch if aortic valve mild regurgitation only would defer replacement due to shorten clamp time and could have a potential TAVR later.  Recommendation was made by Dr. Ysidro Evert for ascending Hemi arch replacement but her and her family weighed the pros and cons of surgery and decided not to proceed.  She and her family are comfortable with this decision of not proceeding with surgery and I support them.  They will be talking with Dr. Cyndia Bent again soon.  Continue with weight lifting restrictions.  She is doing well.  She understands consequences of ascending aortic aneurysm.  She would be extremely high risk for surgery.  Moderate aortic regurgitation - Demonstrated on echocardiogram.  Murmur heard on exam.  Doing well without any shortness of breath.  Family history of CAD - Crestor started on previous visit.  LDL responded excellently at 57.  No myalgias.   85-monthfollow-up  Medication Adjustments/Labs and Tests Ordered: Current medicines are reviewed at length with the patient today.  Concerns regarding medicines are outlined above.  No orders of the defined types were placed in this encounter.  No orders of the defined types were placed in this encounter.   Patient Instructions  Medication Instructions:  Your physician recommends that you continue on your current medications as directed. Please refer to the Current Medication list given to you today.  *If you need a refill on your cardiac medications before your next appointment, please call your pharmacy*  Follow-Up: [facility] and your [facility] needs are our priority.  As part of our continuing mission to provide you with exceptional heart care, we have created designated Provider Care Teams.  These Care Teams include your primary  Cardiologist (physician) and Advanced Practice Providers (APPs -  Physician Assistants and Nurse Practitioners) who all work together to provide you with the care you need, when you need it.   Your next appointment:   6 month(s)  Provider:   MCandee Furbish MD        Signed, MCandee Furbish MD  04/26/2022 3:19 PM    [place]

## 2022-04-25 NOTE — Patient Instructions (Signed)
Medication Instructions:  Your physician recommends that you continue on your current medications as directed. Please refer to the Current Medication list given to you today.  *If you need a refill on your cardiac medications before your next appointment, please call your pharmacy*  Follow-Up: At [facility], you and your health needs are our priority.  As part of our continuing mission to provide you with exceptional heart care, we have created designated Provider Care Teams.  These Care Teams include your primary Cardiologist (physician) and Advanced Practice Providers (APPs -  Physician Assistants and Nurse Practitioners) who all work together to provide you with the care you need, when you need it.   Your next appointment:   6 month(s)  Provider:   Candee Furbish, MD

## 2022-04-26 DIAGNOSIS — R2689 Other abnormalities of gait and mobility: Secondary | ICD-10-CM | POA: Diagnosis not present

## 2022-04-26 DIAGNOSIS — M6389 Disorders of muscle in diseases classified elsewhere, multiple sites: Secondary | ICD-10-CM | POA: Diagnosis not present

## 2022-04-26 DIAGNOSIS — R413 Other amnesia: Secondary | ICD-10-CM | POA: Diagnosis not present

## 2022-04-26 DIAGNOSIS — M25652 Stiffness of left hip, not elsewhere classified: Secondary | ICD-10-CM | POA: Diagnosis not present

## 2022-04-26 DIAGNOSIS — M25552 Pain in left hip: Secondary | ICD-10-CM | POA: Diagnosis not present

## 2022-04-26 DIAGNOSIS — R278 Other lack of coordination: Secondary | ICD-10-CM | POA: Diagnosis not present

## 2022-04-26 DIAGNOSIS — Z96642 Presence of left artificial hip joint: Secondary | ICD-10-CM | POA: Diagnosis not present

## 2022-04-27 DIAGNOSIS — M25652 Stiffness of left hip, not elsewhere classified: Secondary | ICD-10-CM | POA: Diagnosis not present

## 2022-04-27 DIAGNOSIS — Z96642 Presence of left artificial hip joint: Secondary | ICD-10-CM | POA: Diagnosis not present

## 2022-04-27 DIAGNOSIS — M25552 Pain in left hip: Secondary | ICD-10-CM | POA: Diagnosis not present

## 2022-04-27 DIAGNOSIS — R278 Other lack of coordination: Secondary | ICD-10-CM | POA: Diagnosis not present

## 2022-04-27 DIAGNOSIS — R2689 Other abnormalities of gait and mobility: Secondary | ICD-10-CM | POA: Diagnosis not present

## 2022-04-27 DIAGNOSIS — M6389 Disorders of muscle in diseases classified elsewhere, multiple sites: Secondary | ICD-10-CM | POA: Diagnosis not present

## 2022-04-29 ENCOUNTER — Other Ambulatory Visit: Payer: Self-pay | Admitting: Neurology

## 2022-04-30 ENCOUNTER — Non-Acute Institutional Stay (SKILLED_NURSING_FACILITY): Payer: Medicare Other | Admitting: Internal Medicine

## 2022-04-30 DIAGNOSIS — R278 Other lack of coordination: Secondary | ICD-10-CM | POA: Diagnosis not present

## 2022-04-30 DIAGNOSIS — R413 Other amnesia: Secondary | ICD-10-CM | POA: Diagnosis not present

## 2022-04-30 DIAGNOSIS — E782 Mixed hyperlipidemia: Secondary | ICD-10-CM

## 2022-04-30 DIAGNOSIS — F5101 Primary insomnia: Secondary | ICD-10-CM

## 2022-04-30 DIAGNOSIS — K219 Gastro-esophageal reflux disease without esophagitis: Secondary | ICD-10-CM

## 2022-04-30 DIAGNOSIS — M6389 Disorders of muscle in diseases classified elsewhere, multiple sites: Secondary | ICD-10-CM | POA: Diagnosis not present

## 2022-04-30 DIAGNOSIS — G3184 Mild cognitive impairment, so stated: Secondary | ICD-10-CM | POA: Diagnosis not present

## 2022-04-30 DIAGNOSIS — I7121 Aneurysm of the ascending aorta, without rupture: Secondary | ICD-10-CM | POA: Diagnosis not present

## 2022-04-30 DIAGNOSIS — M25652 Stiffness of left hip, not elsewhere classified: Secondary | ICD-10-CM | POA: Diagnosis not present

## 2022-04-30 DIAGNOSIS — R2689 Other abnormalities of gait and mobility: Secondary | ICD-10-CM | POA: Diagnosis not present

## 2022-04-30 DIAGNOSIS — Z96642 Presence of left artificial hip joint: Secondary | ICD-10-CM | POA: Diagnosis not present

## 2022-04-30 DIAGNOSIS — M25552 Pain in left hip: Secondary | ICD-10-CM | POA: Diagnosis not present

## 2022-05-01 DIAGNOSIS — R413 Other amnesia: Secondary | ICD-10-CM | POA: Diagnosis not present

## 2022-05-01 DIAGNOSIS — Z96642 Presence of left artificial hip joint: Secondary | ICD-10-CM | POA: Diagnosis not present

## 2022-05-01 DIAGNOSIS — M6389 Disorders of muscle in diseases classified elsewhere, multiple sites: Secondary | ICD-10-CM | POA: Diagnosis not present

## 2022-05-01 DIAGNOSIS — R278 Other lack of coordination: Secondary | ICD-10-CM | POA: Diagnosis not present

## 2022-05-02 DIAGNOSIS — Z96642 Presence of left artificial hip joint: Secondary | ICD-10-CM | POA: Diagnosis not present

## 2022-05-02 DIAGNOSIS — R278 Other lack of coordination: Secondary | ICD-10-CM | POA: Diagnosis not present

## 2022-05-02 DIAGNOSIS — M6389 Disorders of muscle in diseases classified elsewhere, multiple sites: Secondary | ICD-10-CM | POA: Diagnosis not present

## 2022-05-02 NOTE — Telephone Encounter (Signed)
Getting thru another MD

## 2022-05-03 DIAGNOSIS — R278 Other lack of coordination: Secondary | ICD-10-CM | POA: Diagnosis not present

## 2022-05-03 DIAGNOSIS — M6389 Disorders of muscle in diseases classified elsewhere, multiple sites: Secondary | ICD-10-CM | POA: Diagnosis not present

## 2022-05-03 DIAGNOSIS — Z96642 Presence of left artificial hip joint: Secondary | ICD-10-CM | POA: Diagnosis not present

## 2022-05-04 ENCOUNTER — Encounter: Payer: Self-pay | Admitting: Internal Medicine

## 2022-05-04 DIAGNOSIS — M6389 Disorders of muscle in diseases classified elsewhere, multiple sites: Secondary | ICD-10-CM | POA: Diagnosis not present

## 2022-05-04 DIAGNOSIS — R413 Other amnesia: Secondary | ICD-10-CM | POA: Diagnosis not present

## 2022-05-04 DIAGNOSIS — M25552 Pain in left hip: Secondary | ICD-10-CM | POA: Diagnosis not present

## 2022-05-04 DIAGNOSIS — R278 Other lack of coordination: Secondary | ICD-10-CM | POA: Diagnosis not present

## 2022-05-04 DIAGNOSIS — Z96642 Presence of left artificial hip joint: Secondary | ICD-10-CM | POA: Diagnosis not present

## 2022-05-04 DIAGNOSIS — M25652 Stiffness of left hip, not elsewhere classified: Secondary | ICD-10-CM | POA: Diagnosis not present

## 2022-05-04 DIAGNOSIS — R2689 Other abnormalities of gait and mobility: Secondary | ICD-10-CM | POA: Diagnosis not present

## 2022-05-04 NOTE — Progress Notes (Signed)
Location:  Occupational psychologist of Service:  SNF (31)  Provider:   Code Status: Full code Goals of Care:     04/03/2022   11:49 AM  Advanced Directives  Does Patient Have a Medical Advance Directive? No  Would patient like information on creating a medical advance directive? No - Patient declined     Chief Complaint  Patient presents with   Acute Visit    For Discharge    HPI: Patient is a 88 y.o. female seen today for Discharge  Discharge from [facility] in [place]  Admitted From 02/19-02/22 for Left Hip Fracture S/P Left Hip Arthroplasty on 04/03/2022  [address] in her apartment Denies any Dizziness or LOC Found to heva Left Hip Fracture Underwent Arthroplasty  In Rehab she did well Walking with walker No Pain  Doing most of her ADLS   Past Medical History:  Diagnosis Date   Aortic aneurysm ([place])    Arthritis    OA BOTH KNEES AND HANDS   Atrial tachycardia    HX OF   GERD (gastroesophageal reflux disease)    Glaucoma    left eye   Goiter    CAUSING COUGH, HOARSINESS AND DRY THROAT   Headache(784.0)    MIGRAINES    Hyperlipidemia    Neuralgia    Overactive bladder    RLS (restless legs syndrome)    Temporal arteritis ([place])     Past Surgical History:  Procedure Laterality Date   ARTERY BIOPSY Right 07/21/2012   Procedure: BIOPSY TEMPORAL ARTERY;  Surgeon: Haywood Lasso, MD;  Location: [place];  Service: General;  Laterality: Right;   CERVICAL FUSION  2012   Dr. Vertell Limber   EYE SURGERY     CATARACT EXTRACTION- BILATERAL   INCONTINENCE SURGERY     JOINT REPLACEMENT     right knee   LEFT SHOULDER SURGERY     RIGHT KNEE ARTHROSCOPY  11/2009     ROBOTIC ASSISTED BILATERAL SALPINGO OOPHERECTOMY Bilateral 12/08/2013   Procedure: ROBOTIC ASSISTED LAPAROSCOPIC BILATERAL SALPINGO OOPHORECTOMY WITH STAGING;  Surgeon: Everitt Amber, MD;  Location: [facility];  Service: Gynecology;  Laterality: Bilateral;   TOTAL HIP ARTHROPLASTY Left 04/03/2022   Procedure:  TOTAL HIP ARTHROPLASTY ANTERIOR APPROACH;  Surgeon: Renette Butters, MD;  Location: [facility];  Service: Orthopedics;  Laterality: Left;   TOTAL KNEE ARTHROPLASTY  07/04/2011   Procedure: TOTAL KNEE ARTHROPLASTY;  Surgeon: Gearlean Alf, MD;  Location: [facility];  Service: Orthopedics;  Laterality: Right;    Allergies  Allergen Reactions   Lisinopril Other (See Comments) and Cough    Very low BP/"very tired" also   Mold Extract [Trichophyton] Other (See Comments)    Headaches and migraines   Molds & Smuts Other (See Comments)    Headaches and migraines   Sulfa Antibiotics    Codeine Nausea Only   Oxycodone Nausea Only    Outpatient Encounter Medications as of 04/30/2022  Medication Sig   aspirin EC 81 MG tablet Take 1 tablet (81 mg total) by mouth 2 (two) times daily. To prevent blood clots for 30 days after surgery.   brimonidine (ALPHAGAN) 0.2 % ophthalmic solution Place 1 drop into both eyes 2 (two) times daily.   Calcium Carbonate-Vitamin D (CALCIUM PLUS VITAMIN D PO) Take 1 tablet by mouth 2 (two) times a week.   Coenzyme Q10 (CO Q 10) 100 MG CAPS Take 100 mg by mouth 2 (two) times a week.   HYDROcodone-acetaminophen (NORCO) 5-325  MG tablet Take 1-2 tablets by mouth every 6 (six) hours as needed for severe pain. MAXIMUM TOTAL ACETAMINOPHEN DOSE IS 4000 MG PER DAY   mirtazapine (REMERON) 7.5 MG tablet TAKE 1 TABLET BY MOUTH AT BEDTIME.   Omega-3 Fatty Acids (FISH OIL) 1200 MG CAPS Take 1,200 mg by mouth 2 (two) times a week.   omeprazole (PRILOSEC) 40 MG capsule TAKE 1 CAPSULE (40 MG TOTAL) BY MOUTH DAILY.   polyethylene glycol (MIRALAX / GLYCOLAX) 17 g packet Take 17 g by mouth daily.   rivastigmine (EXELON) 1.5 MG capsule TAKE 1 CAPSULE EACH MORNING.   rivastigmine (EXELON) 3 MG capsule TAKE 1 CAPSULE (3 MG TOTAL) BY MOUTH AT BEDTIME.   rosuvastatin (CRESTOR) 10 MG tablet TAKE 1 TABLET BY MOUTH EVERY DAY   senna-docusate (SENOKOT-S) 8.6-50 MG tablet Take 1 tablet by mouth 2 (two)  times daily.   SYSTANE ULTRA PF 0.4-0.3 % SOLN Apply 1 drop to eye 3 (three) times daily as needed (for dryness).   No facility-administered encounter medications on file as of 04/30/2022.    Review of Systems:  Review of Systems  Health Maintenance  Topic Date Due   Medicare Annual Wellness (AWV)  04/18/2021   INFLUENZA VACCINE  09/12/2021   COVID-19 Vaccine (2 - Moderna risk series) 12/20/2021   DTaP/Tdap/Td (2 - Tdap) 12/12/2027   Pneumonia Vaccine 67+ Years old  Completed   DEXA SCAN  Completed   Zoster Vaccines- Shingrix  Completed   HPV VACCINES  Aged Out    Physical Exam: Vitals:   05/01/22 1029  BP: (!) 140/70  Pulse: 90  Resp: 16  Temp: (!) 97.1 F (36.2 C)  Weight: 107 lb 6.4 oz (48.7 kg)   Body mass index is 18.73 kg/m. Physical Exam Vitals reviewed.  Constitutional:      Appearance: Normal appearance.  HENT:     Head: Normocephalic.     Nose: Nose normal.     Mouth/Throat:     Mouth: Mucous membranes are moist.     Pharynx: Oropharynx is clear.  Eyes:     Pupils: Pupils are equal, round, and reactive to light.  Cardiovascular:     Rate and Rhythm: Normal rate and regular rhythm.     Pulses: Normal pulses.     Heart sounds: Normal heart sounds. No murmur heard. Pulmonary:     Effort: Pulmonary effort is normal.     Breath sounds: Normal breath sounds.  Abdominal:     General: Abdomen is flat. Bowel sounds are normal.     Palpations: Abdomen is soft.  Musculoskeletal:        General: No swelling.     Cervical back: Neck supple.  Skin:    General: Skin is warm.  Neurological:     General: No focal deficit present.     Mental Status: She is alert and oriented to person, place, and time.  Psychiatric:        Mood and Affect: Mood normal.        Thought Content: Thought content normal.     Labs reviewed: Basic Metabolic Panel: Recent Labs    11/09/21 1452 12/29/21 1255 04/02/22 1650 04/03/22 0330 04/04/22 0808  NA 139  --  144  --   138  K 4.1  --  3.7  --  3.7  CL 101  --  110  --  105  CO2 25  --  26  --  26  GLUCOSE 91  --  86  --  121*  BUN 23  --  25*  --  20  CREATININE 0.78 0.80 0.60  --  0.68  CALCIUM 9.6  --  8.5*  --  8.5*  MG  --   --  1.9  --   --   PHOS  --   --  3.2  --   --   TSH  --   --   --  0.281*  --    Liver Function Tests: Recent Labs    11/05/21 2127 04/02/22 1650  AST 15 25  ALT 13 20  ALKPHOS 76 58  BILITOT 0.5 0.6  PROT 6.9 6.2*  ALBUMIN 4.3 3.6   No results for input(s): "LIPASE", "AMYLASE" in the last 8760 hours. No results for input(s): "AMMONIA" in the last 8760 hours. CBC: Recent Labs    11/05/21 2127 04/02/22 1650 04/04/22 0808  WBC 6.3 5.5 9.2  NEUTROABS 4.1 4.1  --   HGB 13.8 12.2 12.3  HCT 41.5 38.9 38.3  MCV 96.1 97.7 95.5  PLT 156 116* 144*   Lipid Panel: No results for input(s): "CHOL", "HDL", "LDLCALC", "TRIG", "CHOLHDL", "LDLDIRECT" in the last 8760 hours. Lab Results  Component Value Date   HGBA1C 5.4 05/21/2014    Procedures since last visit: No results found.  Assessment/Plan 1. S/P total left hip arthroplasty Pain control Continue working with therapy Follow with Ortho  2. MCI (mild cognitive impairment) MMSE 28/30 Passed Clock drawing On [employer]  Working with [facility]  Has supportive family daughter And also Home health  3. Primary insomnia Doing well on Remeron  4. Mixed hyperlipidemia statin  5. Aneurysm of ascending aorta without rupture (HCC) Conservative management  6. Gastroesophageal reflux disease without esophagitis On Prilosec    Labs/tests ordered:  * No order type specified * Next appt:  05/15/2022

## 2022-05-07 ENCOUNTER — Other Ambulatory Visit: Payer: Self-pay | Admitting: Adult Health

## 2022-05-07 MED ORDER — ASPIRIN 81 MG PO TBEC: 81.0000 mg | DELAYED_RELEASE_TABLET | Freq: Every day | ORAL | 5 refills | Status: DC

## 2022-05-09 DIAGNOSIS — M25652 Stiffness of left hip, not elsewhere classified: Secondary | ICD-10-CM | POA: Diagnosis not present

## 2022-05-09 DIAGNOSIS — R2689 Other abnormalities of gait and mobility: Secondary | ICD-10-CM | POA: Diagnosis not present

## 2022-05-09 DIAGNOSIS — M6389 Disorders of muscle in diseases classified elsewhere, multiple sites: Secondary | ICD-10-CM | POA: Diagnosis not present

## 2022-05-09 DIAGNOSIS — R278 Other lack of coordination: Secondary | ICD-10-CM | POA: Diagnosis not present

## 2022-05-09 DIAGNOSIS — Z96642 Presence of left artificial hip joint: Secondary | ICD-10-CM | POA: Diagnosis not present

## 2022-05-09 DIAGNOSIS — M25552 Pain in left hip: Secondary | ICD-10-CM | POA: Diagnosis not present

## 2022-05-11 DIAGNOSIS — M25552 Pain in left hip: Secondary | ICD-10-CM | POA: Diagnosis not present

## 2022-05-11 DIAGNOSIS — R2689 Other abnormalities of gait and mobility: Secondary | ICD-10-CM | POA: Diagnosis not present

## 2022-05-11 DIAGNOSIS — Z96642 Presence of left artificial hip joint: Secondary | ICD-10-CM | POA: Diagnosis not present

## 2022-05-11 DIAGNOSIS — M6389 Disorders of muscle in diseases classified elsewhere, multiple sites: Secondary | ICD-10-CM | POA: Diagnosis not present

## 2022-05-11 DIAGNOSIS — M25652 Stiffness of left hip, not elsewhere classified: Secondary | ICD-10-CM | POA: Diagnosis not present

## 2022-05-11 DIAGNOSIS — R278 Other lack of coordination: Secondary | ICD-10-CM | POA: Diagnosis not present

## 2022-05-15 ENCOUNTER — Encounter: Payer: Medicare Other | Admitting: Internal Medicine

## 2022-05-16 DIAGNOSIS — R278 Other lack of coordination: Secondary | ICD-10-CM | POA: Diagnosis not present

## 2022-05-16 DIAGNOSIS — R2689 Other abnormalities of gait and mobility: Secondary | ICD-10-CM | POA: Diagnosis not present

## 2022-05-16 DIAGNOSIS — Z96642 Presence of left artificial hip joint: Secondary | ICD-10-CM | POA: Diagnosis not present

## 2022-05-16 DIAGNOSIS — M6389 Disorders of muscle in diseases classified elsewhere, multiple sites: Secondary | ICD-10-CM | POA: Diagnosis not present

## 2022-05-16 DIAGNOSIS — M25652 Stiffness of left hip, not elsewhere classified: Secondary | ICD-10-CM | POA: Diagnosis not present

## 2022-05-16 DIAGNOSIS — M545 Low back pain, unspecified: Secondary | ICD-10-CM | POA: Diagnosis not present

## 2022-05-16 DIAGNOSIS — M25552 Pain in left hip: Secondary | ICD-10-CM | POA: Diagnosis not present

## 2022-05-16 DIAGNOSIS — S72042D Displaced fracture of base of neck of left femur, subsequent encounter for closed fracture with routine healing: Secondary | ICD-10-CM | POA: Diagnosis not present

## 2022-05-17 DIAGNOSIS — R413 Other amnesia: Secondary | ICD-10-CM | POA: Diagnosis not present

## 2022-05-18 ENCOUNTER — Other Ambulatory Visit: Payer: Self-pay | Admitting: Neurology

## 2022-05-18 DIAGNOSIS — M25552 Pain in left hip: Secondary | ICD-10-CM | POA: Diagnosis not present

## 2022-05-18 DIAGNOSIS — Z96642 Presence of left artificial hip joint: Secondary | ICD-10-CM | POA: Diagnosis not present

## 2022-05-18 DIAGNOSIS — R2689 Other abnormalities of gait and mobility: Secondary | ICD-10-CM | POA: Diagnosis not present

## 2022-05-18 DIAGNOSIS — M25652 Stiffness of left hip, not elsewhere classified: Secondary | ICD-10-CM | POA: Diagnosis not present

## 2022-05-18 DIAGNOSIS — R278 Other lack of coordination: Secondary | ICD-10-CM | POA: Diagnosis not present

## 2022-05-18 DIAGNOSIS — M6389 Disorders of muscle in diseases classified elsewhere, multiple sites: Secondary | ICD-10-CM | POA: Diagnosis not present

## 2022-05-22 ENCOUNTER — Other Ambulatory Visit: Payer: Self-pay | Admitting: *Deleted

## 2022-05-22 MED ORDER — RIVASTIGMINE TARTRATE 1.5 MG PO CAPS: ORAL_CAPSULE | ORAL | 4 refills | Status: DC

## 2022-05-22 NOTE — Telephone Encounter (Signed)
This pt last seen by Shanda Bumps, NP in 2021.  Daughter asking for refill.  I have sent message to her stating pts needs appt scheduled.

## 2022-05-23 DIAGNOSIS — R278 Other lack of coordination: Secondary | ICD-10-CM | POA: Diagnosis not present

## 2022-05-23 DIAGNOSIS — M6389 Disorders of muscle in diseases classified elsewhere, multiple sites: Secondary | ICD-10-CM | POA: Diagnosis not present

## 2022-05-23 DIAGNOSIS — R2689 Other abnormalities of gait and mobility: Secondary | ICD-10-CM | POA: Diagnosis not present

## 2022-05-23 DIAGNOSIS — Z96642 Presence of left artificial hip joint: Secondary | ICD-10-CM | POA: Diagnosis not present

## 2022-05-23 DIAGNOSIS — M25552 Pain in left hip: Secondary | ICD-10-CM | POA: Diagnosis not present

## 2022-05-23 DIAGNOSIS — M25652 Stiffness of left hip, not elsewhere classified: Secondary | ICD-10-CM | POA: Diagnosis not present

## 2022-05-25 DIAGNOSIS — M25552 Pain in left hip: Secondary | ICD-10-CM | POA: Diagnosis not present

## 2022-05-25 DIAGNOSIS — M6389 Disorders of muscle in diseases classified elsewhere, multiple sites: Secondary | ICD-10-CM | POA: Diagnosis not present

## 2022-05-25 DIAGNOSIS — R278 Other lack of coordination: Secondary | ICD-10-CM | POA: Diagnosis not present

## 2022-05-25 DIAGNOSIS — Z96642 Presence of left artificial hip joint: Secondary | ICD-10-CM | POA: Diagnosis not present

## 2022-05-25 DIAGNOSIS — R2689 Other abnormalities of gait and mobility: Secondary | ICD-10-CM | POA: Diagnosis not present

## 2022-05-25 DIAGNOSIS — M25652 Stiffness of left hip, not elsewhere classified: Secondary | ICD-10-CM | POA: Diagnosis not present

## 2022-06-04 ENCOUNTER — Encounter (HOSPITAL_BASED_OUTPATIENT_CLINIC_OR_DEPARTMENT_OTHER): Payer: Self-pay | Admitting: Physical Therapy

## 2022-06-20 DIAGNOSIS — S72042D Displaced fracture of base of neck of left femur, subsequent encounter for closed fracture with routine healing: Secondary | ICD-10-CM | POA: Diagnosis not present

## 2022-06-27 ENCOUNTER — Ambulatory Visit (HOSPITAL_BASED_OUTPATIENT_CLINIC_OR_DEPARTMENT_OTHER): Payer: Medicare Other | Attending: Orthopedic Surgery | Admitting: Physical Therapy

## 2022-06-27 DIAGNOSIS — M25552 Pain in left hip: Secondary | ICD-10-CM | POA: Insufficient documentation

## 2022-06-27 DIAGNOSIS — M5459 Other low back pain: Secondary | ICD-10-CM | POA: Insufficient documentation

## 2022-06-27 DIAGNOSIS — M6281 Muscle weakness (generalized): Secondary | ICD-10-CM | POA: Diagnosis not present

## 2022-06-27 NOTE — Therapy (Signed)
OUTPATIENT PHYSICAL THERAPY LOWER EXTREMITY EVALUATION   Patient Name: Janet Wilson MRN: [id number] DOB:Jun 23, 1934, 88 y.o., female Today's Date: 06/28/2022  END OF SESSION:  PT End of Session - 06/27/22 1153     Visit Number 1    Number of Visits 8    Date for PT Re-Evaluation 08/08/22    Authorization Type UHC MCR    PT Start Time 1025    PT Stop Time 1114    PT Time Calculation (min) 49 min    Activity Tolerance Patient tolerated treatment well    Behavior During Therapy WFL for tasks assessed/performed             Past Medical History:  Diagnosis Date   Aortic aneurysm (HCC)    Arthritis    OA BOTH KNEES AND HANDS   Atrial tachycardia    HX OF   GERD (gastroesophageal reflux disease)    Glaucoma    left eye   Goiter    CAUSING COUGH, HOARSINESS AND DRY THROAT   Headache(784.0)    MIGRAINES    Hyperlipidemia    Neuralgia    Overactive bladder    RLS (restless legs syndrome)    Temporal arteritis (HCC)    Past Surgical History:  Procedure Laterality Date   ARTERY BIOPSY Right 07/21/2012   Procedure: BIOPSY TEMPORAL ARTERY;  Surgeon: Currie Paris, MD;  Location[facility];  Service: General;  Laterality: Right;   CERVICAL FUSION  2012   Dr. Venetia Maxon   EYE SURGERY     CATARACT EXTRACTION- BILATERAL   INCONTINENCE SURGERY     JOINT REPLACEMENT     right knee   LEFT SHOULDER SURGERY     RIGHT KNEE ARTHROSCOPY  11/2009     ROBOTIC ASSISTED BILATERAL SALPINGO OOPHERECTOMY Bilateral 12/08/2013   Procedure: ROBOTIC ASSISTED LAPAROSCOPIC BILATERAL SALPINGO OOPHORECTOMY WITH STAGING;  Surgeon: Adolphus Birchwood, MD;  Location[facility];  Service: Gynecology;  Laterality: Bilateral;   TOTAL HIP ARTHROPLASTY Left 04/03/2022   Procedure: TOTAL HIP ARTHROPLASTY ANTERIOR APPROACH;  Surgeon: Sheral Apley, MD;  Location[facility];  Service: Orthopedics;  Laterality: Left;   TOTAL KNEE ARTHROPLASTY  07/04/2011   Procedure: TOTAL KNEE ARTHROPLASTY;  Surgeon: Loanne Drilling, MD;  Location[facility];  Service: Orthopedics;  Laterality: Right;   Patient Active Problem List   Diagnosis Date Noted   Aortic valve disease 04/04/2022   Closed left hip fracture (HCC) 04/02/2022   Aortic aneurysm (HCC) 04/02/2022   Dementia (HCC) 01/21/2022   Post-nasal drip 12/20/2021   Gastroesophageal reflux disease without esophagitis 12/20/2021   Depression with anxiety 12/20/2021   Cellulitis of right leg 12/20/2021   Primary insomnia 12/20/2021   Hyperlipidemia 12/20/2021   Coronary artery calcification 12/18/2019   Aortic atherosclerosis (HCC) 12/18/2019   Pure hypercholesterolemia 12/18/2019   Senile osteopenia 03/20/2018   H/O temporal arteritis 03/20/2018   Memory loss, short term 03/20/2018   Weight loss 03/20/2018   Nausea 03/20/2018   Mixed urge and stress incontinence 03/20/2018   Multinodular goiter (nontoxic) 03/20/2018   RLS (restless legs syndrome) 12/19/2017   Pain in right knee 10/09/2017   Ectopic atrial tachycardia 10/08/2016   Cough 09/06/2015   Hoarseness of voice 09/06/2015   Rhinitis, chronic 09/06/2015   Bilateral occipital neuralgia 06/16/2015   Convergence insufficiency 08/09/2014   Neck pain 03/05/2013   Capsular glaucoma with pseudoexfoliation of lens, left eye, moderate stage 12/22/2012   Right temporal headache 06/30/2012   Family history of  coronary artery disease 09/24/2011   S/P TKR (total knee replacement) 09/24/2011   Tachycardia 08/10/2011   OA (osteoarthritis) of knee 07/04/2011     REFERRING PROVIDER: Sheral Apley, MD  REFERRING DIAG: left total hip arthroplasty for fracture 04/03/2022; lumbago  THERAPY DIAG:  Muscle weakness (generalized)  Pain in left hip  Other low back pain  Rationale for Evaluation and Treatment: Rehabilitation  ONSET DATE: DOS 04/03/2022  SUBJECTIVE:   SUBJECTIVE STATEMENT: [place].  She slipped off of her recliner when sitting on the edge.  Pt was admitted  to the hospital from 02/19-02/22 and was found to have a left hip fracture.  Pt underwent left total Hip Arthroplasty with anterior approach on 04/03/2022.  Pt went to rehab at [facility] for 3 weeks and received PT and OT.  Pt made good improvement with therapy and is doing much better.   Pt reports having back issues before the hip surgery.  Pt states some days the back bothers her more than the hip and some days the hip is worse.  Pt states the back is not a big problem.  Pt has difficulty with performing car transfers.  Pt is more cautious and is slower with mobility.  Pt states she doesn't want to fall.  Pt is not ambulating as far at [employer] as she was prior to surgery.  She states her R LE feels weaker, but her L knee wants to give out daily.   [name] present and did help with subjective.   PERTINENT HISTORY: -L THA with anterior approach on 04/03/2022  -Ascending aortic aneurysm 5.7 cm.  Follows with Dr. Lavinia Sharps.  Pt has a 10# lifting restriction   -L4-5 anterolisthesis, L knee pain with meniscus tear, MCI (mild cognitive impairment), R TKR in 2013  -Arthritis, CAD, cervical fusion      PAIN:  Are you having pain?  Location:  L hip NPRS:  0/10 current and best, 2/10 worst  Location:  lumbar NPRS:  0/10 current and best, 2-3/10 worst  PRECAUTIONS: Anterior hip and Other: Ascending aortic aneurysm 5.7 cm with 10# lifting restriction, L4-5 anterolisthesis, R TKR, cervical fusion, L knee meniscus tear and L knee wanting to give way  WEIGHT BEARING RESTRICTIONS:  none indicated  FALLS:  Has patient fallen in last 6 months? Yes. Number of falls 2  LIVING ENVIRONMENT: Lives with: lives with their spouse Lives in: 1 level home.  Independent living at [employer]: no Has following equipment at home: Single point cane and Walker - 2 wheeled   PLOF: Independent  Pt ambulated 3 laps at [employer] and did not use an AD.  Pt was able to  garden.  PATIENT GOALS: able to do her gardening, able to attend events at [facility]  NEXT MD VISIT: 07/25/2022  OBJECTIVE:   DIAGNOSTIC FINDINGS: X rays in 2015:  Grade 1 anterolisthesis L4-5, mild multilevel deg changes of the lumbar spine  PATIENT SURVEYS:  FOTO 43 with a goal of 61 at visit #14  COGNITION: Overall cognitive status: Pt was able to answer questions and followed commands well.  Pt has MCI which pt's daughter confirms.      PALPATION: No tenderness in palpation of central lumbar and lumbar flanks  LOWER EXTREMITY MMT:  MMT Right eval Left eval  Hip flexion 4/5 4-/5  Hip extension    Hip abduction 17.8 11.4  Hip adduction    Hip internal rotation    Hip  external rotation    Knee flexion 4/5 seated 4/5 seated  Knee extension 4+/5 4-/5  Ankle dorsiflexion    Ankle plantarflexion    Ankle inversion    Ankle eversion     (Blank rows = not tested)   FUNCTIONAL TESTS:  5x STS test: 21.24 3 reps without UE support/2 reps with UE support  pt able to perform without UE support.   GAIT: Assistive device utilized: None Comments: Pt ambulates with a good heel toe gait with reciprocal arm swing.  Pt had no limp.  Pt has decreased pelvic rotation bilat   TODAY'S TREATMENT:                                                                                                                                 Reviewed HEP.   See pt education below.    PATIENT EDUCATION:  Education details: objective findings, dx, prognosis, POC, rationale of interventions, and relevant anatomy.  Person educated: Patient and daughter Education method: Explanation Education comprehension: verbalized understanding  HOME EXERCISE PROGRAM: Pt has a HEP from rehab though pt is not performing HEP per pt and daughter.   ASSESSMENT:  CLINICAL IMPRESSION: Patient is an 88 y.o. female 12 weeks and 1 day s/p L THA for hip fractures and also has a dx of lumbago.  Pt reports having having  lumbago prior to THA and states her back is not a big problem.  Pt was in rehab at [facility] receiving PT and OT for 3 weeks.  Pt has difficulty with performing car transfers and is more cautious and slower with mobility.  Pt is not ambulating as far at [employer] as she was prior to surgery.  She is unable to garden and is limited with attending events at [facility].  She reports her L LE wants to give out daily.  Pt has muscle weakness in bilat LE's.  Pt demonstrates functional LE strength as evidenced by scoring on 5x STS test.  Pt should benefit from skilled PT services to address impairments and to improve overall function.    OBJECTIVE IMPAIRMENTS: decreased activity tolerance, decreased endurance, decreased mobility, difficulty walking, decreased strength, and pain.   ACTIVITY LIMITATIONS: squatting, transfers, and locomotion level  PARTICIPATION LIMITATIONS: cleaning, community activity, and gardening  PERSONAL FACTORS: 3+ comorbidities: Ascending aortic aneurysm 5.7 cm, MCI, R TKR, cervical fusion, L knee meniscus tear, arthritis  are also affecting patient's functional outcome.   REHAB POTENTIAL: Good  CLINICAL DECISION MAKING: Stable/uncomplicated  EVALUATION COMPLEXITY: Low   GOALS:  SHORT TERM GOALS: Target date: 07/18/2022  Pt will be independent and compliant with HEP for improved strength, pain, mobility, and function.  Baseline: Goal status: INITIAL  2.  Pt will tolerate exercises without adverse effects for improved L LE strength and tolerance with daily mobility.  Baseline:  Goal status: INITIAL  3.  Pt will report at least a 40% improvement in daily mobility.  Baseline:  Goal status: INITIAL    LONG TERM GOALS: Target date: 08/08/2022   Pt will demo improved L LE strength in hip flexion to 4/5 and L knee extension to 4+/5 to = R LE and at least 4-5# increase in hip abd to improve performance of and tolerance with functional mobility including extending  ambulation distance.  Baseline:  Goal status: INITIAL  2.  Pt will report increased ambulation distance at [facility] without increased pain safely in order to attend more events.  Baseline:  Goal status: INITIAL  3.  Pt will be able to perform car transfers independently with no > than minimal difficulty.  Baseline:  Goal status: INITIAL  4.  Pt will report she is able to perform her normal functional mobility skills safely without significant difficulty and pain.  Baseline:  Goal status: INITIAL     PLAN:  PT FREQUENCY: 1-2x/week  PT DURATION: 6 weeks  PLANNED INTERVENTIONS: Therapeutic exercises, Therapeutic activity, Neuromuscular re-education, Balance training, Gait training, Patient/Family education, Self Care, Stair training, Aquatic Therapy, Dry Needling, Electrical stimulation, Cryotherapy, Moist heat, Taping, Manual therapy, and Re-evaluation  PLAN FOR NEXT SESSION: review current HEP.  Progress LE strengthening per protocol.  Balance training.  Monitor L LE stability--Pt states her L knee wants to give out daily.    Audie Clear III PT, DPT 06/28/22 6:36 PM

## 2022-06-28 ENCOUNTER — Encounter (HOSPITAL_BASED_OUTPATIENT_CLINIC_OR_DEPARTMENT_OTHER): Payer: Self-pay | Admitting: Physical Therapy

## 2022-06-28 ENCOUNTER — Other Ambulatory Visit: Payer: Self-pay

## 2022-06-30 ENCOUNTER — Ambulatory Visit (HOSPITAL_BASED_OUTPATIENT_CLINIC_OR_DEPARTMENT_OTHER): Payer: Medicare Other

## 2022-06-30 ENCOUNTER — Encounter (HOSPITAL_BASED_OUTPATIENT_CLINIC_OR_DEPARTMENT_OTHER): Payer: Self-pay

## 2022-06-30 DIAGNOSIS — M6281 Muscle weakness (generalized): Secondary | ICD-10-CM | POA: Diagnosis not present

## 2022-06-30 DIAGNOSIS — M25552 Pain in left hip: Secondary | ICD-10-CM | POA: Diagnosis not present

## 2022-06-30 DIAGNOSIS — M5459 Other low back pain: Secondary | ICD-10-CM | POA: Diagnosis not present

## 2022-06-30 NOTE — Therapy (Signed)
OUTPATIENT PHYSICAL THERAPY LOWER EXTREMITY TREATMENT   Patient Name: Rosia Rhome MRN: [id number] DOB:14-May-1934, 88 y.o., female Today's Date: 06/30/2022  END OF SESSION:  PT End of Session - 06/30/22 0906     Visit Number 2    Number of Visits 8    Date for PT Re-Evaluation 08/08/22    Authorization Type [employer] MCR    PT Start Time 0907    PT Stop Time 0956    PT Time Calculation (min) 49 min    Activity Tolerance Patient tolerated treatment well    Behavior During Therapy WFL for tasks assessed/performed              Past Medical History:  Diagnosis Date   Aortic aneurysm (HCC)    Arthritis    OA BOTH KNEES AND HANDS   Atrial tachycardia    HX OF   GERD (gastroesophageal reflux disease)    Glaucoma    left eye   Goiter    CAUSING COUGH, HOARSINESS AND DRY THROAT   Headache(784.0)    MIGRAINES    Hyperlipidemia    Neuralgia    Overactive bladder    RLS (restless legs syndrome)    Temporal arteritis (HCC)    Past Surgical History:  Procedure Laterality Date   ARTERY BIOPSY Right 07/21/2012   Procedure: BIOPSY TEMPORAL ARTERY;  Surgeon: Currie Paris, MD;  Location: [facility];  Service: General;  Laterality: Right;   CERVICAL FUSION  2012   Dr. Venetia Maxon   EYE SURGERY     CATARACT EXTRACTION- BILATERAL   INCONTINENCE SURGERY     JOINT REPLACEMENT     right knee   LEFT SHOULDER SURGERY     RIGHT KNEE ARTHROSCOPY  11/2009     ROBOTIC ASSISTED BILATERAL SALPINGO OOPHERECTOMY Bilateral 12/08/2013   Procedure: ROBOTIC ASSISTED LAPAROSCOPIC BILATERAL SALPINGO OOPHORECTOMY WITH STAGING;  Surgeon: Adolphus Birchwood, MD;  Location: [facility];  Service: Gynecology;  Laterality: Bilateral;   TOTAL HIP ARTHROPLASTY Left 04/03/2022   Procedure: TOTAL HIP ARTHROPLASTY ANTERIOR APPROACH;  Surgeon: Sheral Apley, MD;  Location: [facility];  Service: Orthopedics;  Laterality: Left;   TOTAL KNEE ARTHROPLASTY  07/04/2011   Procedure: TOTAL KNEE ARTHROPLASTY;  Surgeon: Loanne Drilling, MD;  Location: [facility];  Service: Orthopedics;  Laterality: Right;   Patient Active Problem List   Diagnosis Date Noted   Aortic valve disease 04/04/2022   Closed left hip fracture (HCC) 04/02/2022   Aortic aneurysm (HCC) 04/02/2022   Dementia (HCC) 01/21/2022   Post-nasal drip 12/20/2021   Gastroesophageal reflux disease without esophagitis 12/20/2021   Depression with anxiety 12/20/2021   Cellulitis of right leg 12/20/2021   Primary insomnia 12/20/2021   Hyperlipidemia 12/20/2021   Coronary artery calcification 12/18/2019   Aortic atherosclerosis (HCC) 12/18/2019   Pure hypercholesterolemia 12/18/2019   Senile osteopenia 03/20/2018   H/O temporal arteritis 03/20/2018   Memory loss, short term 03/20/2018   Weight loss 03/20/2018   Nausea 03/20/2018   Mixed urge and stress incontinence 03/20/2018   Multinodular goiter (nontoxic) 03/20/2018   RLS (restless legs syndrome) 12/19/2017   Pain in right knee 10/09/2017   Ectopic atrial tachycardia 10/08/2016   Cough 09/06/2015   Hoarseness of voice 09/06/2015   Rhinitis, chronic 09/06/2015   Bilateral occipital neuralgia 06/16/2015   Convergence insufficiency 08/09/2014   Neck pain 03/05/2013   Capsular glaucoma with pseudoexfoliation of lens, left eye, moderate stage 12/22/2012   Right temporal headache 06/30/2012   Family history  of coronary artery disease 09/24/2011   S/P TKR (total knee replacement) 09/24/2011   Tachycardia 08/10/2011   OA (osteoarthritis) of knee 07/04/2011     REFERRING PROVIDER: Sheral Apley, MD  REFERRING DIAG: left total hip arthroplasty for fracture 04/03/2022; lumbago  THERAPY DIAG:  Muscle weakness (generalized)  Pain in left hip  Other low back pain  Rationale for Evaluation and Treatment: Rehabilitation  ONSET DATE: DOS 04/03/2022  SUBJECTIVE:   SUBJECTIVE STATEMENT: Pt reports her knee bothers her more than her hip. "I probably need knee surgery, but they can't operate  because of my aorta." No pain in back, knee, or hip. She reports no recent falls. Does not use AD.  EVAL: [place].  She slipped off of her recliner when sitting on the edge.  Pt was admitted to the hospital from 02/19-02/22 and was found to have a left hip fracture.  Pt underwent left total Hip Arthroplasty with anterior approach on 04/03/2022.  Pt went to rehab at  Hospital for [facility] and received PT and OT.  Pt made good improvement with therapy and is doing much better.   Pt reports having back issues before the hip surgery.  Pt states some days the back bothers her more than the hip and some days the hip is worse.  Pt states the back is not a big problem.  Pt has difficulty with performing car transfers.  Pt is more cautious and is slower with mobility.  Pt states she doesn't want to fall.  Pt is not ambulating as far at [employer] as she was prior to surgery.  She states her R LE feels weaker, but her L knee wants to give out daily.   [name] present and did help with subjective.   PERTINENT HISTORY: -L THA with anterior approach on 04/03/2022  -Ascending aortic aneurysm 5.7 cm.  Follows with Dr. Lavinia Sharps.  Pt has a 10# lifting restriction   -L4-5 anterolisthesis, L knee pain with meniscus tear, MCI (mild cognitive impairment), R TKR in 2013  -Arthritis, CAD, cervical fusion      PAIN:  Are you having pain?  Location:  L hip NPRS:  0/10 current and best, 2/10 worst  Location:  lumbar NPRS:  0/10 current and best, 2-3/10 worst  PRECAUTIONS: Anterior hip and Other: Ascending aortic aneurysm 5.7 cm with 10# lifting restriction, L4-5 anterolisthesis, R TKR, cervical fusion, L knee meniscus tear and L knee wanting to give way  WEIGHT BEARING RESTRICTIONS:  none indicated  FALLS:  Has patient fallen in last 6 months? Yes. Number of falls 2  LIVING ENVIRONMENT: Lives with: lives with their spouse Lives in: 1 level home.   Independent living at [employer]: no Has following equipment at home: Single point cane and Walker - 2 wheeled   PLOF: Independent  Pt ambulated 3 laps at [employer] and did not use an AD.  Pt was able to garden.  PATIENT GOALS: able to do her gardening, able to attend events at [facility]  NEXT MD VISIT: 07/25/2022  OBJECTIVE:   DIAGNOSTIC FINDINGS: X rays in 2015:  Grade 1 anterolisthesis L4-5, mild multilevel deg changes of the lumbar spine  PATIENT SURVEYS:  FOTO 43 with a goal of 61 at visit #14  COGNITION: Overall cognitive status: Pt was able to answer questions and followed commands well.  Pt has MCI which pt's daughter confirms.      PALPATION: No tenderness in palpation of central  lumbar and lumbar flanks  LOWER EXTREMITY MMT:  MMT Right eval Left eval  Hip flexion 4/5 4-/5  Hip extension    Hip abduction 17.8 11.4  Hip adduction    Hip internal rotation    Hip external rotation    Knee flexion 4/5 seated 4/5 seated  Knee extension 4+/5 4-/5  Ankle dorsiflexion    Ankle plantarflexion    Ankle inversion    Ankle eversion     (Blank rows = not tested)   FUNCTIONAL TESTS:  5x STS test: 21.24 3 reps without UE support/2 reps with UE support  pt able to perform without UE support.   GAIT: Assistive device utilized: None Comments: Pt ambulates with a good heel toe gait with reciprocal arm swing.  Pt had no limp.  Pt has decreased pelvic rotation bilat   TODAY'S TREATMENT:                                                                                                                                 5/18: -Nu-step L3 x5 min UE/LE (try just LE next time, bothered R shoulder) -PROM L hip -Manual stretching of L HS, piriformis -hooklying adductor squeeze 5" x20 -Hooklying clam GTB 3x10 (pt reported this felt easy). -LTR x10ea -SAQ 5" 2x10L -hooklying marching 2x10 -seated march 2x10 -Gait training 338ft  -Standing marches x20 -partial squats  x10 (some crepitus but no pain) -STS from plinth x8 (mild L knee discomfort) Cues for valgus.    PATIENT EDUCATION:  Education details: objective findings, dx, prognosis, POC, rationale of interventions, and relevant anatomy.  Person educated: Patient and daughter Education method: Explanation Education comprehension: verbalized understanding  HOME EXERCISE PROGRAM: Pt has a HEP from rehab though pt is not performing HEP per pt and daughter.   ASSESSMENT:  CLINICAL IMPRESSION: Patient was advised to bring her cane with her next visit which she agreed to. Due to buckling of L knee she will benefit from [facility] use to reduce fall risk. She also reported feeling unsteady following gait training in clinic without AD. She had good tolerance for initiation of exercise program today without complaint of pain with most tasks. She did have mild discomfort with STS so limited her reps with this. Will monitor response and progress as tolerated.    OBJECTIVE IMPAIRMENTS: decreased activity tolerance, decreased endurance, decreased mobility, difficulty walking, decreased strength, and pain.   ACTIVITY LIMITATIONS: squatting, transfers, and locomotion level  PARTICIPATION LIMITATIONS: cleaning, community activity, and gardening  PERSONAL FACTORS: 3+ comorbidities: Ascending aortic aneurysm 5.7 cm, MCI, R TKR, cervical fusion, L knee meniscus tear, arthritis  are also affecting patient's functional outcome.   REHAB POTENTIAL: Good  CLINICAL DECISION MAKING: Stable/uncomplicated  EVALUATION COMPLEXITY: Low   GOALS:  SHORT TERM GOALS: Target date: 07/18/2022  Pt will be independent and compliant with HEP for improved strength, pain, mobility, and function.  Baseline: Goal status: INITIAL  2.  Pt will  tolerate exercises without adverse effects for improved L LE strength and tolerance with daily mobility.  Baseline:  Goal status: INITIAL  3.  Pt will report at least a 40% improvement in daily  mobility.  Baseline:  Goal status: INITIAL    LONG TERM GOALS: Target date: 08/08/2022   Pt will demo improved L LE strength in hip flexion to 4/5 and L knee extension to 4+/5 to = R LE and at least 4-5# increase in hip abd to improve performance of and tolerance with functional mobility including extending ambulation distance.  Baseline:  Goal status: INITIAL  2.  Pt will report increased ambulation distance at [facility] without increased pain safely in order to attend more events.  Baseline:  Goal status: INITIAL  3.  Pt will be able to perform car transfers independently with no > than minimal difficulty.  Baseline:  Goal status: INITIAL  4.  Pt will report she is able to perform her normal functional mobility skills safely without significant difficulty and pain.  Baseline:  Goal status: INITIAL     PLAN:  PT FREQUENCY: 1-2x/week  PT DURATION: 6 weeks  PLANNED INTERVENTIONS: Therapeutic exercises, Therapeutic activity, Neuromuscular re-education, Balance training, Gait training, Patient/Family education, Self Care, Stair training, Aquatic Therapy, Dry Needling, Electrical stimulation, Cryotherapy, Moist heat, Taping, Manual therapy, and Re-evaluation  PLAN FOR NEXT SESSION: review current HEP.  Progress LE strengthening per protocol.  Balance training.  Monitor L LE stability--Pt states her L knee wants to give out daily.    Riki Altes, PTA  06/30/22 10:16 AM

## 2022-07-03 ENCOUNTER — Ambulatory Visit (HOSPITAL_BASED_OUTPATIENT_CLINIC_OR_DEPARTMENT_OTHER): Payer: Medicare Other | Admitting: Physical Therapy

## 2022-07-03 DIAGNOSIS — M6281 Muscle weakness (generalized): Secondary | ICD-10-CM | POA: Diagnosis not present

## 2022-07-03 DIAGNOSIS — M5459 Other low back pain: Secondary | ICD-10-CM

## 2022-07-03 DIAGNOSIS — M25552 Pain in left hip: Secondary | ICD-10-CM

## 2022-07-03 NOTE — Therapy (Signed)
OUTPATIENT PHYSICAL THERAPY LOWER EXTREMITY TREATMENT   Patient Name: Janet Wilson MRN: [id number] DOB:07/08/1934, 88 y.o., female Today's Date: 07/04/2022  END OF SESSION:  PT End of Session - 07/03/22 0806     Visit Number 3    Number of Visits 8    Date for PT Re-Evaluation 08/08/22    Authorization Type [employer] MCR    PT Start Time 0802    PT Stop Time 0845    PT Time Calculation (min) 43 min    Activity Tolerance Patient tolerated treatment well    Behavior During Therapy WFL for tasks assessed/performed              Past Medical History:  Diagnosis Date   Aortic aneurysm (HCC)    Arthritis    OA BOTH KNEES AND HANDS   Atrial tachycardia    HX OF   GERD (gastroesophageal reflux disease)    Glaucoma    left eye   Goiter    CAUSING COUGH, HOARSINESS AND DRY THROAT   Headache(784.0)    MIGRAINES    Hyperlipidemia    Neuralgia    Overactive bladder    RLS (restless legs syndrome)    Temporal arteritis (HCC)    Past Surgical History:  Procedure Laterality Date   ARTERY BIOPSY Right 07/21/2012   Procedure: BIOPSY TEMPORAL ARTERY;  Surgeon: Currie Paris, MD;  Location: [facility];  Service: General;  Laterality: Right;   CERVICAL FUSION  2012   Dr. Venetia Maxon   EYE SURGERY     CATARACT EXTRACTION- BILATERAL   INCONTINENCE SURGERY     JOINT REPLACEMENT     right knee   LEFT SHOULDER SURGERY     RIGHT KNEE ARTHROSCOPY  11/2009     ROBOTIC ASSISTED BILATERAL SALPINGO OOPHERECTOMY Bilateral 12/08/2013   Procedure: ROBOTIC ASSISTED LAPAROSCOPIC BILATERAL SALPINGO OOPHORECTOMY WITH STAGING;  Surgeon: Adolphus Birchwood, MD;  Location: [facility];  Service: Gynecology;  Laterality: Bilateral;   TOTAL HIP ARTHROPLASTY Left 04/03/2022   Procedure: TOTAL HIP ARTHROPLASTY ANTERIOR APPROACH;  Surgeon: Sheral Apley, MD;  Location: [facility];  Service: Orthopedics;  Laterality: Left;   TOTAL KNEE ARTHROPLASTY  07/04/2011   Procedure: TOTAL KNEE ARTHROPLASTY;  Surgeon: Loanne Drilling, MD;  Location: [facility];  Service: Orthopedics;  Laterality: Right;   Patient Active Problem List   Diagnosis Date Noted   Aortic valve disease 04/04/2022   Closed left hip fracture (HCC) 04/02/2022   Aortic aneurysm (HCC) 04/02/2022   Dementia (HCC) 01/21/2022   Post-nasal drip 12/20/2021   Gastroesophageal reflux disease without esophagitis 12/20/2021   Depression with anxiety 12/20/2021   Cellulitis of right leg 12/20/2021   Primary insomnia 12/20/2021   Hyperlipidemia 12/20/2021   Coronary artery calcification 12/18/2019   Aortic atherosclerosis (HCC) 12/18/2019   Pure hypercholesterolemia 12/18/2019   Senile osteopenia 03/20/2018   H/O temporal arteritis 03/20/2018   Memory loss, short term 03/20/2018   Weight loss 03/20/2018   Nausea 03/20/2018   Mixed urge and stress incontinence 03/20/2018   Multinodular goiter (nontoxic) 03/20/2018   RLS (restless legs syndrome) 12/19/2017   Pain in right knee 10/09/2017   Ectopic atrial tachycardia 10/08/2016   Cough 09/06/2015   Hoarseness of voice 09/06/2015   Rhinitis, chronic 09/06/2015   Bilateral occipital neuralgia 06/16/2015   Convergence insufficiency 08/09/2014   Neck pain 03/05/2013   Capsular glaucoma with pseudoexfoliation of lens, left eye, moderate stage 12/22/2012   Right temporal headache 06/30/2012   Family history  of coronary artery disease 09/24/2011   S/P TKR (total knee replacement) 09/24/2011   Tachycardia 08/10/2011   OA (osteoarthritis) of knee 07/04/2011     REFERRING PROVIDER: Sheral Apley, MD  REFERRING DIAG: left total hip arthroplasty for fracture 04/03/2022; lumbago  THERAPY DIAG:  Muscle weakness (generalized)  Pain in left hip  Other low back pain  Rationale for Evaluation and Treatment: Rehabilitation  ONSET DATE: DOS 04/03/2022  SUBJECTIVE:   SUBJECTIVE STATEMENT: Pt states she felt good after prior Rx.  Pt has been performing her HEP from rehab.  Pt states she had  pain when getting OOB thru the night though the pain went away.  She denies pain currently.  Pt doesn't use her cane inside home though uses the cane outside of home.  Pt reports she did some planting yesterday and had some discomfort in back.  She was tired with planting flowers though had no significant pain.         PERTINENT HISTORY: -L THA with anterior approach on 04/03/2022  -Ascending aortic aneurysm 5.7 cm.  Follows with Dr. Lavinia Sharps.  Pt has a 10# lifting restriction   -L4-5 anterolisthesis, L knee pain with meniscus tear, MCI (mild cognitive impairment), R TKR in 2013  -Arthritis, CAD, cervical fusion      PAIN:  Are you having pain?  Location:  L hip NPRS:  0/10 current and best, 2/10 worst  Location:  lumbar NPRS:  0/10 current and best, 2-3/10 worst  PRECAUTIONS: Anterior hip and Other: Ascending aortic aneurysm 5.7 cm with 10# lifting restriction, L4-5 anterolisthesis, R TKR, cervical fusion, L knee meniscus tear and L knee wanting to give way  WEIGHT BEARING RESTRICTIONS:  none indicated  FALLS:  Has patient fallen in last 6 months? Yes. Number of falls 2  LIVING ENVIRONMENT: Lives with: lives with their spouse Lives in: 1 level home.  Independent living at [employer]: no Has following equipment at home: Single point cane and Walker - 2 wheeled   PLOF: Independent  Pt ambulated 3 laps at [employer] and did not use an AD.  Pt was able to garden.  PATIENT GOALS: able to do her gardening, able to attend events at [facility]  NEXT MD VISIT: 07/25/2022  OBJECTIVE:   DIAGNOSTIC FINDINGS: X rays in 2015:  Grade 1 anterolisthesis L4-5, mild multilevel deg changes of the lumbar spine  PATIENT SURVEYS:  FOTO 43 with a goal of 61 at visit #14  COGNITION: Overall cognitive status: Pt was able to answer questions and followed commands well.  Pt has MCI which pt's daughter confirms.      PALPATION: No tenderness in palpation of central lumbar and lumbar  flanks  LOWER EXTREMITY MMT:  MMT Right eval Left eval  Hip flexion 4/5 4-/5  Hip extension    Hip abduction 17.8 11.4  Hip adduction    Hip internal rotation    Hip external rotation    Knee flexion 4/5 seated 4/5 seated  Knee extension 4+/5 4-/5  Ankle dorsiflexion    Ankle plantarflexion    Ankle inversion    Ankle eversion     (Blank rows = not tested)   FUNCTIONAL TESTS:  5x STS test: 21.24 3 reps without UE support/2 reps with UE support  pt able to perform without UE support.   GAIT: Assistive device utilized: None Comments: Pt ambulates with a good heel toe gait with reciprocal arm swing.  Pt had no limp.  Pt has decreased pelvic rotation  bilat   TODAY'S TREATMENT:                                                                                                                                 Gait:  PT adjusted cane to correct height and educated pt in correct height.  Pt ambulated approx 300 ft with SPC with cuing and instruction for gait sequencing.  -L knee AROM:  2 - 122 deg  -Nu-step L2 x 4 min just LE's  -Manual stretching of bilat HS in supine 2x20-30 sec bilat -hooklying adductor squeeze 5" x20 -Hooklying clam GTB 3x10 -SAQ with 1# 2x10 L -partial squats x10 with UE support with verbal and tactile cuing to decrease valgus -STS from elevated plinth x10 with Cues for valgus. -standing heel raises x 10 reps with UE support    PATIENT EDUCATION:  Education details: ROM findings, dx, prognosis, POC, exercise form, rationale of interventions, and relevant anatomy.  Person educated: Patient and   Education method: Explanation, demonstration, verbal and tactile cues Education comprehension: verbalized understanding, returned demonstration, verbal and tactile cues required  HOME EXERCISE PROGRAM: Pt has a HEP from rehab though pt is not performing HEP per pt and daughter.   ASSESSMENT:  CLINICAL IMPRESSION: Pt brought in her cane.  PT adjusted cane to  correct height and instructed pt in correct gait sequencing with cane.  Pt ambulated well with cane and demonstrated good cane sequencing.  Pt tolerated exercises well.  She has valgus with sit to stands and mini squats.  PT provided verbal, visual and tactile cues to decrease valgus and improve knee positioning with sit to stands and mini squats.  Pt responded well to Rx having no increased pain after Rx.  She should benefit from cont skilled PT services to address ongoing goals and to improve overall function.     OBJECTIVE IMPAIRMENTS: decreased activity tolerance, decreased endurance, decreased mobility, difficulty walking, decreased strength, and pain.   ACTIVITY LIMITATIONS: squatting, transfers, and locomotion level  PARTICIPATION LIMITATIONS: cleaning, community activity, and gardening  PERSONAL FACTORS: 3+ comorbidities: Ascending aortic aneurysm 5.7 cm, MCI, R TKR, cervical fusion, L knee meniscus tear, arthritis  are also affecting patient's functional outcome.   REHAB POTENTIAL: Good  CLINICAL DECISION MAKING: Stable/uncomplicated  EVALUATION COMPLEXITY: Low   GOALS:  SHORT TERM GOALS: Target date: 07/18/2022  Pt will be independent and compliant with HEP for improved strength, pain, mobility, and function.  Baseline: Goal status: INITIAL  2.  Pt will tolerate exercises without adverse effects for improved L LE strength and tolerance with daily mobility.  Baseline:  Goal status: INITIAL  3.  Pt will report at least a 40% improvement in daily mobility.  Baseline:  Goal status: INITIAL    LONG TERM GOALS: Target date: 08/08/2022   Pt will demo improved L LE strength in hip flexion to 4/5 and L knee extension to 4+/5 to = R LE and at least  4-5# increase in hip abd to improve performance of and tolerance with functional mobility including extending ambulation distance.  Baseline:  Goal status: INITIAL  2.  Pt will report increased ambulation distance at [facility]  without increased pain safely in order to attend more events.  Baseline:  Goal status: INITIAL  3.  Pt will be able to perform car transfers independently with no > than minimal difficulty.  Baseline:  Goal status: INITIAL  4.  Pt will report she is able to perform her normal functional mobility skills safely without significant difficulty and pain.  Baseline:  Goal status: INITIAL     PLAN:  PT FREQUENCY: 1-2x/week  PT DURATION: 6 weeks  PLANNED INTERVENTIONS: Therapeutic exercises, Therapeutic activity, Neuromuscular re-education, Balance training, Gait training, Patient/Family education, Self Care, Stair training, Aquatic Therapy, Dry Needling, Electrical stimulation, Cryotherapy, Moist heat, Taping, Manual therapy, and Re-evaluation  PLAN FOR NEXT SESSION: review current HEP.  Progress LE strengthening per protocol.  Balance training.  Monitor L LE stability--Pt states her L knee wants to give out daily.    Audie Clear III PT, DPT 07/04/22 1:15 PM

## 2022-07-04 ENCOUNTER — Encounter (HOSPITAL_BASED_OUTPATIENT_CLINIC_OR_DEPARTMENT_OTHER): Payer: Self-pay | Admitting: Physical Therapy

## 2022-07-07 ENCOUNTER — Ambulatory Visit (HOSPITAL_BASED_OUTPATIENT_CLINIC_OR_DEPARTMENT_OTHER): Payer: Medicare Other | Admitting: Rehabilitative and Restorative Service Providers"

## 2022-07-07 ENCOUNTER — Encounter (HOSPITAL_BASED_OUTPATIENT_CLINIC_OR_DEPARTMENT_OTHER): Payer: Self-pay | Admitting: Rehabilitative and Restorative Service Providers"

## 2022-07-07 DIAGNOSIS — M5459 Other low back pain: Secondary | ICD-10-CM | POA: Diagnosis not present

## 2022-07-07 DIAGNOSIS — M25552 Pain in left hip: Secondary | ICD-10-CM | POA: Diagnosis not present

## 2022-07-07 DIAGNOSIS — M6281 Muscle weakness (generalized): Secondary | ICD-10-CM

## 2022-07-07 NOTE — Therapy (Signed)
OUTPATIENT PHYSICAL THERAPY LOWER EXTREMITY TREATMENT   Patient Name: Janet Wilson MRN: [id number] DOB:February 03, 1935, 88 y.o., female Today's Date: 07/07/2022  END OF SESSION:  PT End of Session - 07/07/22 1053     Visit Number 4    Number of Visits 8    Date for PT Re-Evaluation 08/08/22    Authorization Type [employer] MCR    PT Start Time 1050    PT Stop Time 1133    PT Time Calculation (min) 43 min    Activity Tolerance Patient tolerated treatment well    Behavior During Therapy WFL for tasks assessed/performed               Past Medical History:  Diagnosis Date   Aortic aneurysm (HCC)    Arthritis    OA BOTH KNEES AND HANDS   Atrial tachycardia    HX OF   GERD (gastroesophageal reflux disease)    Glaucoma    left eye   Goiter    CAUSING COUGH, HOARSINESS AND DRY THROAT   Headache(784.0)    MIGRAINES    Hyperlipidemia    Neuralgia    Overactive bladder    RLS (restless legs syndrome)    Temporal arteritis (HCC)    Past Surgical History:  Procedure Laterality Date   ARTERY BIOPSY Right 07/21/2012   Procedure: BIOPSY TEMPORAL ARTERY;  Surgeon: Currie Paris, MD;  Location: [facility];  Service: General;  Laterality: Right;   CERVICAL FUSION  2012   Dr. Venetia Maxon   EYE SURGERY     CATARACT EXTRACTION- BILATERAL   INCONTINENCE SURGERY     JOINT REPLACEMENT     right knee   LEFT SHOULDER SURGERY     RIGHT KNEE ARTHROSCOPY  11/2009     ROBOTIC ASSISTED BILATERAL SALPINGO OOPHERECTOMY Bilateral 12/08/2013   Procedure: ROBOTIC ASSISTED LAPAROSCOPIC BILATERAL SALPINGO OOPHORECTOMY WITH STAGING;  Surgeon: Adolphus Birchwood, MD;  Location: [facility];  Service: Gynecology;  Laterality: Bilateral;   TOTAL HIP ARTHROPLASTY Left 04/03/2022   Procedure: TOTAL HIP ARTHROPLASTY ANTERIOR APPROACH;  Surgeon: Sheral Apley, MD;  Location: [facility];  Service: Orthopedics;  Laterality: Left;   TOTAL KNEE ARTHROPLASTY  07/04/2011   Procedure: TOTAL KNEE ARTHROPLASTY;  Surgeon:  Loanne Drilling, MD;  Location: [facility];  Service: Orthopedics;  Laterality: Right;   Patient Active Problem List   Diagnosis Date Noted   Aortic valve disease 04/04/2022   Closed left hip fracture (HCC) 04/02/2022   Aortic aneurysm (HCC) 04/02/2022   Dementia (HCC) 01/21/2022   Post-nasal drip 12/20/2021   Gastroesophageal reflux disease without esophagitis 12/20/2021   Depression with anxiety 12/20/2021   Cellulitis of right leg 12/20/2021   Primary insomnia 12/20/2021   Hyperlipidemia 12/20/2021   Coronary artery calcification 12/18/2019   Aortic atherosclerosis (HCC) 12/18/2019   Pure hypercholesterolemia 12/18/2019   Senile osteopenia 03/20/2018   H/O temporal arteritis 03/20/2018   Memory loss, short term 03/20/2018   Weight loss 03/20/2018   Nausea 03/20/2018   Mixed urge and stress incontinence 03/20/2018   Multinodular goiter (nontoxic) 03/20/2018   RLS (restless legs syndrome) 12/19/2017   Pain in right knee 10/09/2017   Ectopic atrial tachycardia 10/08/2016   Cough 09/06/2015   Hoarseness of voice 09/06/2015   Rhinitis, chronic 09/06/2015   Bilateral occipital neuralgia 06/16/2015   Convergence insufficiency 08/09/2014   Neck pain 03/05/2013   Capsular glaucoma with pseudoexfoliation of lens, left eye, moderate stage 12/22/2012   Right temporal headache 06/30/2012   Family  history of coronary artery disease 09/24/2011   S/P TKR (total knee replacement) 09/24/2011   Tachycardia 08/10/2011   OA (osteoarthritis) of knee 07/04/2011     REFERRING PROVIDER: Sheral Apley, MD  REFERRING DIAG: left total hip arthroplasty for fracture 04/03/2022; lumbago  THERAPY DIAG:  Muscle weakness (generalized)  Pain in left hip  Rationale for Evaluation and Treatment: Rehabilitation  ONSET DATE: DOS 04/03/2022  SUBJECTIVE:   SUBJECTIVE STATEMENT: I am doing good.          PERTINENT HISTORY: -L THA with anterior approach on 04/03/2022  -Ascending aortic aneurysm  5.7 cm.  Follows with Dr. Lavinia Sharps.  Pt has a 10# lifting restriction   -L4-5 anterolisthesis, L knee pain with meniscus tear, MCI (mild cognitive impairment), R TKR in 2013  -Arthritis, CAD, cervical fusion      PAIN:  Are you having pain?  Location:  L hip NPRS:  0/10 current and best, 2/10 worst  Location:  lumbar NPRS:  0/10 current and best, 2-3/10 worst  PRECAUTIONS: Anterior hip and Other: Ascending aortic aneurysm 5.7 cm with 10# lifting restriction, L4-5 anterolisthesis, R TKR, cervical fusion, L knee meniscus tear and L knee wanting to give way  WEIGHT BEARING RESTRICTIONS:  none indicated  FALLS:  Has patient fallen in last 6 months? Yes. Number of falls 2  LIVING ENVIRONMENT: Lives with: lives with their spouse Lives in: 1 level home.  Independent living at [employer]: no Has following equipment at home: Single point cane and Walker - 2 wheeled   PLOF: Independent  Pt ambulated 3 laps at [employer] and did not use an AD.  Pt was able to garden.  PATIENT GOALS: able to do her gardening, able to attend events at [facility]  NEXT MD VISIT: 07/25/2022  OBJECTIVE:   DIAGNOSTIC FINDINGS: X rays in 2015:  Grade 1 anterolisthesis L4-5, mild multilevel deg changes of the lumbar spine  PATIENT SURVEYS:  FOTO 43 with a goal of 61 at visit #14  COGNITION: Overall cognitive status: Pt was able to answer questions and followed commands well.  Pt has MCI which pt's daughter confirms.      PALPATION: No tenderness in palpation of central lumbar and lumbar flanks  LOWER EXTREMITY MMT:  MMT Right eval Left eval  Hip flexion 4/5 4-/5  Hip extension    Hip abduction 17.8 11.4  Hip adduction    Hip internal rotation    Hip external rotation    Knee flexion 4/5 seated 4/5 seated  Knee extension 4+/5 4-/5  Ankle dorsiflexion    Ankle plantarflexion    Ankle inversion    Ankle eversion     (Blank rows = not tested)   FUNCTIONAL TESTS:  5x STS test:  21.24 3 reps without UE support/2 reps with UE support  pt able to perform without UE support.   GAIT: Assistive device utilized: None Comments: Pt ambulates with a good heel toe gait with reciprocal arm swing.  Pt had no limp.  Pt has decreased pelvic rotation bilat   TODAY'S TREATMENT:       [facility] Adult PT Treatment:                                                DATE: 07/07/22 Therapeutic Exercise: Nu-step L2 x 5 min just LE's  Manual stretching of bilat HS  in supine 2x20-30 sec bilat SAQ with 1.5#  L x20; 2 lbs x 20 Hamstring digs gently L x 20 with PT palpating Hamstring contraction STS from low plinth x10 ea with Cues for valgus with narrow base of support, wide base of support  Lateral side stepping x 20 ft each direction x 3 total bouts R sidelying L hip abdction with knee flexed x 20 R sidelying L clam shell x 20 R sidelying L hip abduction limited AROM x 10 with good contraction of glute med palpated Supine glute set with clam shell x 20 Supine pillow squeeze x 20 with 3-5 sec hold 6" front step taps holding onto rail x 20 each LE 6" lateral step taps holding onto rail x 20 each LE Seated bil LAQ x 0 Seated L iso LAQ with hip abduction/adduction x 20  Seated L hamstring stretch 2x30 sec                                                                                                                            ______________________________________________ Gait:  PT adjusted cane to correct height and educated pt in correct height.  Pt ambulated approx 300 ft with SPC with cuing and instruction for gait sequencing.  -L knee AROM:  2 - 122 deg  -Nu-step L2 x 4 min just LE's  -Manual stretching of bilat HS in supine 2x20-30 sec bilat -hooklying adductor squeeze 5" x20 -Hooklying clam GTB 3x10 -SAQ with 1# 2x10 L -partial squats x10 with UE support with verbal and tactile cuing to decrease valgus -STS from elevated plinth x10 with Cues for valgus. -standing heel raises x  10 reps with UE support    PATIENT EDUCATION:  Education details: ROM findings, dx, prognosis, POC, exercise form, rationale of interventions, and relevant anatomy.  Person educated: Patient and   Education method: Explanation, demonstration, verbal and tactile cues Education comprehension: verbalized understanding, returned demonstration, verbal and tactile cues required  HOME EXERCISE PROGRAM: Pt has a HEP from rehab though pt is not performing HEP per pt and daughter.   ASSESSMENT:  CLINICAL IMPRESSION: Pt states the adjustment of her cane was great but she left it at home today. All therex focused on L hip strengthening and proper muscle facilitation for functional strengthening. She should benefit from cont skilled PT services to address ongoing goals and to improve overall function.     OBJECTIVE IMPAIRMENTS: decreased activity tolerance, decreased endurance, decreased mobility, difficulty walking, decreased strength, and pain.   ACTIVITY LIMITATIONS: squatting, transfers, and locomotion level  PARTICIPATION LIMITATIONS: cleaning, community activity, and gardening  PERSONAL FACTORS: 3+ comorbidities: Ascending aortic aneurysm 5.7 cm, MCI, R TKR, cervical fusion, L knee meniscus tear, arthritis  are also affecting patient's functional outcome.   REHAB POTENTIAL: Good  CLINICAL DECISION MAKING: Stable/uncomplicated  EVALUATION COMPLEXITY: Low   GOALS:  SHORT TERM GOALS: Target date: 07/18/2022  Pt will be independent and compliant with HEP for improved strength, pain, mobility,  and function.  Baseline: Goal status: INITIAL  2.  Pt will tolerate exercises without adverse effects for improved L LE strength and tolerance with daily mobility.  Baseline:  Goal status: INITIAL  3.  Pt will report at least a 40% improvement in daily mobility.  Baseline:  Goal status: INITIAL    LONG TERM GOALS: Target date: 08/08/2022   Pt will demo improved L LE strength in hip  flexion to 4/5 and L knee extension to 4+/5 to = R LE and at least 4-5# increase in hip abd to improve performance of and tolerance with functional mobility including extending ambulation distance.  Baseline:  Goal status: INITIAL  2.  Pt will report increased ambulation distance at [facility] without increased pain safely in order to attend more events.  Baseline:  Goal status: INITIAL  3.  Pt will be able to perform car transfers independently with no > than minimal difficulty.  Baseline:  Goal status: INITIAL  4.  Pt will report she is able to perform her normal functional mobility skills safely without significant difficulty and pain.  Baseline:  Goal status: INITIAL     PLAN:  PT FREQUENCY: 1-2x/week  PT DURATION: 6 weeks  PLANNED INTERVENTIONS: Therapeutic exercises, Therapeutic activity, Neuromuscular re-education, Balance training, Gait training, Patient/Family education, Self Care, Stair training, Aquatic Therapy, Dry Needling, Electrical stimulation, Cryotherapy, Moist heat, Taping, Manual therapy, and Re-evaluation  PLAN FOR NEXT SESSION: review current HEP (pt did not bring to this visit).  Progress LE strengthening per protocol.  Balance training.  Monitor L LE stability--Pt states her L knee wants to give out daily.

## 2022-07-10 ENCOUNTER — Ambulatory Visit (HOSPITAL_BASED_OUTPATIENT_CLINIC_OR_DEPARTMENT_OTHER): Payer: Medicare Other

## 2022-07-10 ENCOUNTER — Encounter (HOSPITAL_BASED_OUTPATIENT_CLINIC_OR_DEPARTMENT_OTHER): Payer: Self-pay

## 2022-07-10 DIAGNOSIS — M5459 Other low back pain: Secondary | ICD-10-CM | POA: Diagnosis not present

## 2022-07-10 DIAGNOSIS — M25552 Pain in left hip: Secondary | ICD-10-CM

## 2022-07-10 DIAGNOSIS — M6281 Muscle weakness (generalized): Secondary | ICD-10-CM | POA: Diagnosis not present

## 2022-07-10 NOTE — Therapy (Signed)
OUTPATIENT PHYSICAL THERAPY LOWER EXTREMITY TREATMENT   Patient Name: Janet Wilson MRN: [id number] DOB:Nov 14, 1934, 88 y.o., female Today's Date: 07/10/2022  END OF SESSION:  PT End of Session - 07/10/22 1029     Visit Number 5    Number of Visits 8    Date for PT Re-Evaluation 08/08/22    Authorization Type [employer] MCR    PT Start Time 1023    PT Stop Time 1101    PT Time Calculation (min) 38 min    Activity Tolerance Patient tolerated treatment well    Behavior During Therapy WFL for tasks assessed/performed                Past Medical History:  Diagnosis Date   Aortic aneurysm (HCC)    Arthritis    OA BOTH KNEES AND HANDS   Atrial tachycardia    HX OF   GERD (gastroesophageal reflux disease)    Glaucoma    left eye   Goiter    CAUSING COUGH, HOARSINESS AND DRY THROAT   Headache(784.0)    MIGRAINES    Hyperlipidemia    Neuralgia    Overactive bladder    RLS (restless legs syndrome)    Temporal arteritis (HCC)    Past Surgical History:  Procedure Laterality Date   ARTERY BIOPSY Right 07/21/2012   Procedure: BIOPSY TEMPORAL ARTERY;  Surgeon: Currie Paris, MD;  Location: [facility];  Service: General;  Laterality: Right;   CERVICAL FUSION  2012   Dr. Venetia Maxon   EYE SURGERY     CATARACT EXTRACTION- BILATERAL   INCONTINENCE SURGERY     JOINT REPLACEMENT     right knee   LEFT SHOULDER SURGERY     RIGHT KNEE ARTHROSCOPY  11/2009     ROBOTIC ASSISTED BILATERAL SALPINGO OOPHERECTOMY Bilateral 12/08/2013   Procedure: ROBOTIC ASSISTED LAPAROSCOPIC BILATERAL SALPINGO OOPHORECTOMY WITH STAGING;  Surgeon: Adolphus Birchwood, MD;  Location: [facility];  Service: Gynecology;  Laterality: Bilateral;   TOTAL HIP ARTHROPLASTY Left 04/03/2022   Procedure: TOTAL HIP ARTHROPLASTY ANTERIOR APPROACH;  Surgeon: Sheral Apley, MD;  Location: [facility];  Service: Orthopedics;  Laterality: Left;   TOTAL KNEE ARTHROPLASTY  07/04/2011   Procedure: TOTAL KNEE ARTHROPLASTY;  Surgeon:  Loanne Drilling, MD;  Location: [facility];  Service: Orthopedics;  Laterality: Right;   Patient Active Problem List   Diagnosis Date Noted   Aortic valve disease 04/04/2022   Closed left hip fracture (HCC) 04/02/2022   Aortic aneurysm (HCC) 04/02/2022   Dementia (HCC) 01/21/2022   Post-nasal drip 12/20/2021   Gastroesophageal reflux disease without esophagitis 12/20/2021   Depression with anxiety 12/20/2021   Cellulitis of right leg 12/20/2021   Primary insomnia 12/20/2021   Hyperlipidemia 12/20/2021   Coronary artery calcification 12/18/2019   Aortic atherosclerosis (HCC) 12/18/2019   Pure hypercholesterolemia 12/18/2019   Senile osteopenia 03/20/2018   H/O temporal arteritis 03/20/2018   Memory loss, short term 03/20/2018   Weight loss 03/20/2018   Nausea 03/20/2018   Mixed urge and stress incontinence 03/20/2018   Multinodular goiter (nontoxic) 03/20/2018   RLS (restless legs syndrome) 12/19/2017   Pain in right knee 10/09/2017   Ectopic atrial tachycardia 10/08/2016   Cough 09/06/2015   Hoarseness of voice 09/06/2015   Rhinitis, chronic 09/06/2015   Bilateral occipital neuralgia 06/16/2015   Convergence insufficiency 08/09/2014   Neck pain 03/05/2013   Capsular glaucoma with pseudoexfoliation of lens, left eye, moderate stage 12/22/2012   Right temporal headache 06/30/2012  Family history of coronary artery disease 09/24/2011   S/P TKR (total knee replacement) 09/24/2011   Tachycardia 08/10/2011   OA (osteoarthritis) of knee 07/04/2011     REFERRING PROVIDER: Sheral Apley, MD  REFERRING DIAG: left total hip arthroplasty for fracture 04/03/2022; lumbago  THERAPY DIAG:  Muscle weakness (generalized)  Pain in left hip  Other low back pain  Rationale for Evaluation and Treatment: Rehabilitation  ONSET DATE: DOS 04/03/2022  SUBJECTIVE:   SUBJECTIVE STATEMENT: Pt with increased discomfort in L knee today. 4-5/10 pain level. "My knee is the only thing that  bothered me this weekend." Pt states she forgot her cane today.          PERTINENT HISTORY: -L THA with anterior approach on 04/03/2022  -Ascending aortic aneurysm 5.7 cm.  Follows with Dr. Lavinia Sharps.  Pt has a 10# lifting restriction   -L4-5 anterolisthesis, L knee pain with meniscus tear, MCI (mild cognitive impairment), R TKR in 2013  -Arthritis, CAD, cervical fusion      PAIN:  Are you having pain?  Location:  L hip NPRS:  0/10 current and best, 2/10 worst  Location:  lumbar NPRS:  0/10 current and best, 2-3/10 worst  PRECAUTIONS: Anterior hip and Other: Ascending aortic aneurysm 5.7 cm with 10# lifting restriction, L4-5 anterolisthesis, R TKR, cervical fusion, L knee meniscus tear and L knee wanting to give way  WEIGHT BEARING RESTRICTIONS:  none indicated  FALLS:  Has patient fallen in last 6 months? Yes. Number of falls 2  LIVING ENVIRONMENT: Lives with: lives with their spouse Lives in: 1 level home.  Independent living at [employer]: no Has following equipment at home: Single point cane and Walker - 2 wheeled   PLOF: Independent  Pt ambulated 3 laps at [employer] and did not use an AD.  Pt was able to garden.  PATIENT GOALS: able to do her gardening, able to attend events at [facility]  NEXT MD VISIT: 07/25/2022  OBJECTIVE:   DIAGNOSTIC FINDINGS: X rays in 2015:  Grade 1 anterolisthesis L4-5, mild multilevel deg changes of the lumbar spine  PATIENT SURVEYS:  FOTO 43 with a goal of 61 at visit #14  COGNITION: Overall cognitive status: Pt was able to answer questions and followed commands well.  Pt has MCI which pt's daughter confirms.      PALPATION: No tenderness in palpation of central lumbar and lumbar flanks  LOWER EXTREMITY MMT:  MMT Right eval Left eval  Hip flexion 4/5 4-/5  Hip extension    Hip abduction 17.8 11.4  Hip adduction    Hip internal rotation    Hip external rotation    Knee flexion 4/5 seated 4/5 seated  Knee  extension 4+/5 4-/5  Ankle dorsiflexion    Ankle plantarflexion    Ankle inversion    Ankle eversion     (Blank rows = not tested)   FUNCTIONAL TESTS:  5x STS test: 21.24 3 reps without UE support/2 reps with UE support  pt able to perform without UE support.   GAIT: Assistive device utilized: None Comments: Pt ambulates with a good heel toe gait with reciprocal arm swing.  Pt had no limp.  Pt has decreased pelvic rotation bilat   TODAY'S TREATMENT:       Treatment:  DATE: 07/10/22 Therapeutic Exercise: Nu-step L3 x 5 min  Manual stretching of bilat HS in supine 2x20-30 sec L with STM to medial HS SAQ with 2#- 3second hold 2x10ea  STS from low plinth x10 (initial cues for decreasing valgus) Lateral side stepping in hall (partial hall x2) R sidelying L clam shell x 20 R sidelying L hip abduction limited AROM x 10 with good contraction of glute med palpated Supine glute set with clam shell x 20 with GTB                                                                                                 [facility] Adult PT Treatment:                                                DATE: 07/07/22 Therapeutic Exercise: Nu-step L2 x 5 min just LE's  Manual stretching of bilat HS in supine 2x20-30 sec bilat SAQ with 1.5#  L x20; 2 lbs x 20 Hamstring digs gently L x 20 with PT palpating Hamstring contraction STS from low plinth x10 ea with Cues for valgus with narrow base of support, wide base of support  Lateral side stepping x 20 ft each direction x 3 total bouts R sidelying L hip abdction with knee flexed x 20 R sidelying L clam shell x 20 R sidelying L hip abduction limited AROM x 10 with good contraction of glute med palpated Supine glute set with clam shell x 20 Supine pillow squeeze x 20 with 3-5 sec hold 6" front step taps holding onto rail x 20 each LE 6" lateral step taps holding onto rail x 20 each LE Seated bil LAQ x 0 Seated L iso  LAQ with hip abduction/adduction x 20  Seated L hamstring stretch 2x30 sec                                                                                                                            ______________________________________________ Gait:  PT adjusted cane to correct height and educated pt in correct height.  Pt ambulated approx 300 ft with SPC with cuing and instruction for gait sequencing.  -L knee AROM:  2 - 122 deg  -Nu-step L2 x 4 min just LE's  -Manual stretching of bilat HS in supine 2x20-30 sec bilat -hooklying adductor squeeze 5" x20 -Hooklying clam GTB 3x10 -SAQ with 1# 2x10 L -partial squats x10 with UE support  with verbal and tactile cuing to decrease valgus -STS from elevated plinth x10 with Cues for valgus. -standing heel raises x 10 reps with UE support    PATIENT EDUCATION:  Education details: ROM findings, dx, prognosis, POC, exercise form, rationale of interventions, and relevant anatomy.  Person educated: Patient and   Education method: Explanation, demonstration, verbal and tactile cues Education comprehension: verbalized understanding, returned demonstration, verbal and tactile cues required  HOME EXERCISE PROGRAM: Pt has a HEP from rehab though pt is not performing HEP per pt and daughter.   ASSESSMENT:  CLINICAL IMPRESSION: Pt fatigues quickly in glute medius mm, but denies pain with this. Required cues for correction of genu valgum with STS and maintained proper positioning for duration of activity. Pt felt improvement in pain level following manual HSS and STM to medial HS in L LE. Will continue to progress towards goals.    OBJECTIVE IMPAIRMENTS: decreased activity tolerance, decreased endurance, decreased mobility, difficulty walking, decreased strength, and pain.   ACTIVITY LIMITATIONS: squatting, transfers, and locomotion level  PARTICIPATION LIMITATIONS: cleaning, community activity, and gardening  PERSONAL FACTORS: 3+ comorbidities:  Ascending aortic aneurysm 5.7 cm, MCI, R TKR, cervical fusion, L knee meniscus tear, arthritis  are also affecting patient's functional outcome.   REHAB POTENTIAL: Good  CLINICAL DECISION MAKING: Stable/uncomplicated  EVALUATION COMPLEXITY: Low   GOALS:  SHORT TERM GOALS: Target date: 07/18/2022  Pt will be independent and compliant with HEP for improved strength, pain, mobility, and function.  Baseline: Goal status: INITIAL  2.  Pt will tolerate exercises without adverse effects for improved L LE strength and tolerance with daily mobility.  Baseline:  Goal status: INITIAL  3.  Pt will report at least a 40% improvement in daily mobility.  Baseline:  Goal status: INITIAL    LONG TERM GOALS: Target date: 08/08/2022   Pt will demo improved L LE strength in hip flexion to 4/5 and L knee extension to 4+/5 to = R LE and at least 4-5# increase in hip abd to improve performance of and tolerance with functional mobility including extending ambulation distance.  Baseline:  Goal status: INITIAL  2.  Pt will report increased ambulation distance at [facility] without increased pain safely in order to attend more events.  Baseline:  Goal status: INITIAL  3.  Pt will be able to perform car transfers independently with no > than minimal difficulty.  Baseline:  Goal status: INITIAL  4.  Pt will report she is able to perform her normal functional mobility skills safely without significant difficulty and pain.  Baseline:  Goal status: INITIAL     PLAN:  PT FREQUENCY: 1-2x/week  PT DURATION: 6 weeks  PLANNED INTERVENTIONS: Therapeutic exercises, Therapeutic activity, Neuromuscular re-education, Balance training, Gait training, Patient/Family education, Self Care, Stair training, Aquatic Therapy, Dry Needling, Electrical stimulation, Cryotherapy, Moist heat, Taping, Manual therapy, and Re-evaluation  PLAN FOR NEXT SESSION: review current HEP (pt did not bring to this visit).   Progress LE strengthening per protocol.  Balance training.  Monitor L LE stability--Pt states her L knee wants to give out daily.

## 2022-07-16 ENCOUNTER — Ambulatory Visit (HOSPITAL_BASED_OUTPATIENT_CLINIC_OR_DEPARTMENT_OTHER): Payer: Medicare Other | Attending: Orthopedic Surgery | Admitting: Physical Therapy

## 2022-07-16 DIAGNOSIS — M6281 Muscle weakness (generalized): Secondary | ICD-10-CM | POA: Diagnosis not present

## 2022-07-16 DIAGNOSIS — M25552 Pain in left hip: Secondary | ICD-10-CM | POA: Insufficient documentation

## 2022-07-16 DIAGNOSIS — M5459 Other low back pain: Secondary | ICD-10-CM | POA: Insufficient documentation

## 2022-07-16 NOTE — Therapy (Signed)
OUTPATIENT PHYSICAL THERAPY LOWER EXTREMITY TREATMENT   Patient Name: Janet Wilson MRN: [id number] DOB:09-Oct-1934, 88 y.o., female Today's Date: 07/17/2022  END OF SESSION:  PT End of Session - 07/16/22 1025     Visit Number 6    Number of Visits 8    Date for PT Re-Evaluation 08/08/22    Authorization Type [employer] MCR    PT Start Time 1025    PT Stop Time 1105    PT Time Calculation (min) 40 min    Activity Tolerance Patient tolerated treatment well    Behavior During Therapy WFL for tasks assessed/performed                Past Medical History:  Diagnosis Date   Aortic aneurysm (HCC)    Arthritis    OA BOTH KNEES AND HANDS   Atrial tachycardia    HX OF   GERD (gastroesophageal reflux disease)    Glaucoma    left eye   Goiter    CAUSING COUGH, HOARSINESS AND DRY THROAT   Headache(784.0)    MIGRAINES    Hyperlipidemia    Neuralgia    Overactive bladder    RLS (restless legs syndrome)    Temporal arteritis (HCC)    Past Surgical History:  Procedure Laterality Date   ARTERY BIOPSY Right 07/21/2012   Procedure: BIOPSY TEMPORAL ARTERY;  Surgeon: Currie Paris, MD;  Location: [facility];  Service: General;  Laterality: Right;   CERVICAL FUSION  2012   Dr. Venetia Maxon   EYE SURGERY     CATARACT EXTRACTION- BILATERAL   INCONTINENCE SURGERY     JOINT REPLACEMENT     right knee   LEFT SHOULDER SURGERY     RIGHT KNEE ARTHROSCOPY  11/2009     ROBOTIC ASSISTED BILATERAL SALPINGO OOPHERECTOMY Bilateral 12/08/2013   Procedure: ROBOTIC ASSISTED LAPAROSCOPIC BILATERAL SALPINGO OOPHORECTOMY WITH STAGING;  Surgeon: Adolphus Birchwood, MD;  Location: [facility];  Service: Gynecology;  Laterality: Bilateral;   TOTAL HIP ARTHROPLASTY Left 04/03/2022   Procedure: TOTAL HIP ARTHROPLASTY ANTERIOR APPROACH;  Surgeon: Sheral Apley, MD;  Location: [facility];  Service: Orthopedics;  Laterality: Left;   TOTAL KNEE ARTHROPLASTY  07/04/2011   Procedure: TOTAL KNEE ARTHROPLASTY;  Surgeon:  Loanne Drilling, MD;  Location: [facility];  Service: Orthopedics;  Laterality: Right;   Patient Active Problem List   Diagnosis Date Noted   Aortic valve disease 04/04/2022   Closed left hip fracture (HCC) 04/02/2022   Aortic aneurysm (HCC) 04/02/2022   Dementia (HCC) 01/21/2022   Post-nasal drip 12/20/2021   Gastroesophageal reflux disease without esophagitis 12/20/2021   Depression with anxiety 12/20/2021   Cellulitis of right leg 12/20/2021   Primary insomnia 12/20/2021   Hyperlipidemia 12/20/2021   Coronary artery calcification 12/18/2019   Aortic atherosclerosis (HCC) 12/18/2019   Pure hypercholesterolemia 12/18/2019   Senile osteopenia 03/20/2018   H/O temporal arteritis 03/20/2018   Memory loss, short term 03/20/2018   Weight loss 03/20/2018   Nausea 03/20/2018   Mixed urge and stress incontinence 03/20/2018   Multinodular goiter (nontoxic) 03/20/2018   RLS (restless legs syndrome) 12/19/2017   Pain in right knee 10/09/2017   Ectopic atrial tachycardia 10/08/2016   Cough 09/06/2015   Hoarseness of voice 09/06/2015   Rhinitis, chronic 09/06/2015   Bilateral occipital neuralgia 06/16/2015   Convergence insufficiency 08/09/2014   Neck pain 03/05/2013   Capsular glaucoma with pseudoexfoliation of lens, left eye, moderate stage 12/22/2012   Right temporal headache 06/30/2012  Family history of coronary artery disease 09/24/2011   S/P TKR (total knee replacement) 09/24/2011   Tachycardia 08/10/2011   OA (osteoarthritis) of knee 07/04/2011     REFERRING PROVIDER: Sheral Apley, MD  REFERRING DIAG: left total hip arthroplasty for fracture 04/03/2022; lumbago  THERAPY DIAG:  Muscle weakness (generalized)  Pain in left hip  Other low back pain  Rationale for Evaluation and Treatment: Rehabilitation  ONSET DATE: DOS 04/03/2022  SUBJECTIVE:   SUBJECTIVE STATEMENT: Pt states she has had some pain in L medial and posterior knee for the past several days.  Her  knee is hurting on the outside today.  Pt denies any specific reason why.  She states she has been walking more inside her home.  Pt states she forgot her cane again today, but has been using the cane.  Pt did a lot of gardening this past weekend.  Pt states she had slight back pain she woke up this AM, but none now.          PERTINENT HISTORY: -L THA with anterior approach on 04/03/2022  -Ascending aortic aneurysm 5.7 cm.  Follows with Dr. Lavinia Sharps.  Pt has a 10# lifting restriction   -L4-5 anterolisthesis, L knee pain with meniscus tear, MCI (mild cognitive impairment), R TKR in 2013  -Arthritis, CAD, cervical fusion    PAIN:  Are you having pain?  Location:  L hip NPRS:  0/10 current and best, 2/10 worst  Location:  lumbar NPRS:  0/10 current and best, 2-3/10 worst  Location:  L lateral knee NPRS:  5/10  PRECAUTIONS: Anterior hip and Other: Ascending aortic aneurysm 5.7 cm with 10# lifting restriction, L4-5 anterolisthesis, R TKR, cervical fusion, L knee meniscus tear and L knee wanting to give way  WEIGHT BEARING RESTRICTIONS:  none indicated  FALLS:  Has patient fallen in last 6 months? Yes. Number of falls 2  LIVING ENVIRONMENT: Lives with: lives with their spouse Lives in: 1 level home.  Independent living at [facility]: no Has following equipment at home: Single point cane and Walker - 2 wheeled   PLOF: Independent  Pt ambulated 3 laps at [employer] and did not use an AD.  Pt was able to garden.  PATIENT GOALS: able to do her gardening, able to attend events at [facility]  NEXT MD VISIT: 07/25/2022  OBJECTIVE:   DIAGNOSTIC FINDINGS: X rays in 2015:  Grade 1 anterolisthesis L4-5, mild multilevel deg changes of the lumbar spine  PATIENT SURVEYS:  FOTO 43 with a goal of 61 at visit #14  COGNITION: Overall cognitive status: Pt was able to answer questions and followed commands well.  Pt has MCI which pt's daughter confirms.      PALPATION: No  tenderness in palpation of central lumbar and lumbar flanks  LOWER EXTREMITY MMT:  MMT Right eval Left eval  Hip flexion 4/5 4-/5  Hip extension    Hip abduction 17.8 11.4  Hip adduction    Hip internal rotation    Hip external rotation    Knee flexion 4/5 seated 4/5 seated  Knee extension 4+/5 4-/5  Ankle dorsiflexion    Ankle plantarflexion    Ankle inversion    Ankle eversion     (Blank rows = not tested)   FUNCTIONAL TESTS:  5x STS test: 21.24 3 reps without UE support/2 reps with UE support  pt able to perform without UE support.   GAIT: Assistive device utilized: None Comments: Pt ambulates with a good heel toe  gait with reciprocal arm swing.  Pt had no limp.  Pt has decreased pelvic rotation bilat   TODAY'S TREATMENT:       Treatment:                                                DATE: 07/16/22 Therapeutic Exercise: Nu-step L3 x 5 min  Manual stretching of bilat HS in supine 2x20-30 sec  LAQ 2x10 bilat  STS from low plinth 2x10 (initial cues for decreasing valgus) side stepping in hall 2x10 without UE support with SBA R sidelying L clam shell x 20 R sidelying L hip abduction limited AROM x 10  Supine glute set with clam shell x 25 with GTB standing heel raises 2 x 10 reps with UE support Standing hip abd x 10 bilat with UE support on rail Standing on airex with FA and with NBOS x 30 sec each with SBA     Treatment:                                                DATE: 07/10/22 Therapeutic Exercise: Nu-step L3 x 5 min  Manual stretching of bilat HS in supine 2x20-30 sec L with STM to medial HS SAQ with 2#- 3second hold 2x10ea  STS from low plinth x10 (initial cues for decreasing valgus) Lateral side stepping in hall (partial hall x2) R sidelying L clam shell x 20 R sidelying L hip abduction limited AROM x 10 with good contraction of glute med palpated Supine glute set with clam shell x 20 with GTB                                                                                                  [facility] Adult PT Treatment:                                                DATE: 07/07/22 Therapeutic Exercise: Nu-step L2 x 5 min just LE's  Manual stretching of bilat HS in supine 2x20-30 sec bilat SAQ with 1.5#  L x20; 2 lbs x 20 Hamstring digs gently L x 20 with PT palpating Hamstring contraction STS from low plinth x10 ea with Cues for valgus with narrow base of support, wide base of support  Lateral side stepping x 20 ft each direction x 3 total bouts R sidelying L hip abdction with knee flexed x 20 R sidelying L clam shell x 20 R sidelying L hip abduction limited AROM x 10 with good contraction of glute med palpated Supine glute set with clam shell x 20 Supine pillow squeeze x 20 with 3-5 sec hold 6" front step taps holding onto rail x 20 each LE  6" lateral step taps holding onto rail x 20 each LE Seated bil LAQ x 0 Seated L iso LAQ with hip abduction/adduction x 20  Seated L hamstring stretch 2x30 sec                                                                                                                            ______________________________________________ Gait:  PT adjusted cane to correct height and educated pt in correct height.  Pt ambulated approx 300 ft with SPC with cuing and instruction for gait sequencing.  -L knee AROM:  2 - 122 deg  -Nu-step L2 x 4 min just LE's  -Manual stretching of bilat HS in supine 2x20-30 sec bilat -hooklying adductor squeeze 5" x20 -Hooklying clam GTB 3x10 -SAQ with 1# 2x10 L -partial squats x10 with UE support with verbal and tactile cuing to decrease valgus -STS from elevated plinth x10 with Cues for valgus. -standing heel raises x 10 reps with UE support    PATIENT EDUCATION:  Education details: ROM findings, dx, prognosis, POC, exercise form, rationale of interventions, and relevant anatomy.  Person educated: Patient and   Education method: Explanation, demonstration, verbal and  tactile cues Education comprehension: verbalized understanding, returned demonstration, verbal and tactile cues required  HOME EXERCISE PROGRAM: Pt has a HEP from rehab though pt is not performing HEP per pt and daughter.   ASSESSMENT:  CLINICAL IMPRESSION: Pt presents to Rx stating she has been having L knee pain.  Pt states she did a lot of gardening over the weekend.  PT monitored L knee pain t/o Rx and pt able to perform exercises without c/o's.  Pt required cuing for proper knee alignment to reduce genu valgum with STS and pt demonstrates improved form with cuing.  Pt does have weakness in  hip abduction though is able to perform in S/L'ing.  She responded well to Rx reporting no pain in hip and improved pain to 0/10 in L knee.   OBJECTIVE IMPAIRMENTS: decreased activity tolerance, decreased endurance, decreased mobility, difficulty walking, decreased strength, and pain.   ACTIVITY LIMITATIONS: squatting, transfers, and locomotion level  PARTICIPATION LIMITATIONS: cleaning, community activity, and gardening  PERSONAL FACTORS: 3+ comorbidities: Ascending aortic aneurysm 5.7 cm, MCI, R TKR, cervical fusion, L knee meniscus tear, arthritis  are also affecting patient's functional outcome.   REHAB POTENTIAL: Good  CLINICAL DECISION MAKING: Stable/uncomplicated  EVALUATION COMPLEXITY: Low   GOALS:  SHORT TERM GOALS: Target date: 07/18/2022  Pt will be independent and compliant with HEP for improved strength, pain, mobility, and function.  Baseline: Goal status: INITIAL  2.  Pt will tolerate exercises without adverse effects for improved L LE strength and tolerance with daily mobility.  Baseline:  Goal status: INITIAL  3.  Pt will report at least a 40% improvement in daily mobility.  Baseline:  Goal status: INITIAL    LONG TERM GOALS: Target date: 08/08/2022   Pt will demo improved L LE strength in  hip flexion to 4/5 and L knee extension to 4+/5 to = R LE and at least 4-5#  increase in hip abd to improve performance of and tolerance with functional mobility including extending ambulation distance.  Baseline:  Goal status: INITIAL  2.  Pt will report increased ambulation distance at [facility] without increased pain safely in order to attend more events.  Baseline:  Goal status: INITIAL  3.  Pt will be able to perform car transfers independently with no > than minimal difficulty.  Baseline:  Goal status: INITIAL  4.  Pt will report she is able to perform her normal functional mobility skills safely without significant difficulty and pain.  Baseline:  Goal status: INITIAL     PLAN:  PT FREQUENCY: 1-2x/week  PT DURATION: 6 weeks  PLANNED INTERVENTIONS: Therapeutic exercises, Therapeutic activity, Neuromuscular re-education, Balance training, Gait training, Patient/Family education, Self Care, Stair training, Aquatic Therapy, Dry Needling, Electrical stimulation, Cryotherapy, Moist heat, Taping, Manual therapy, and Re-evaluation  PLAN FOR NEXT SESSION: review current HEP (pt did not bring to this visit).  Progress LE strengthening per protocol.  Balance training.  Monitor L LE stability--Pt states her L knee wants to give out daily.   Audie Clear III PT, DPT 07/17/22 12:02 PM

## 2022-07-17 ENCOUNTER — Encounter (HOSPITAL_BASED_OUTPATIENT_CLINIC_OR_DEPARTMENT_OTHER): Payer: Self-pay | Admitting: Physical Therapy

## 2022-07-19 ENCOUNTER — Encounter (HOSPITAL_BASED_OUTPATIENT_CLINIC_OR_DEPARTMENT_OTHER): Payer: Self-pay

## 2022-07-19 ENCOUNTER — Ambulatory Visit (HOSPITAL_BASED_OUTPATIENT_CLINIC_OR_DEPARTMENT_OTHER): Payer: Medicare Other

## 2022-07-19 DIAGNOSIS — M5459 Other low back pain: Secondary | ICD-10-CM | POA: Diagnosis not present

## 2022-07-19 DIAGNOSIS — M6281 Muscle weakness (generalized): Secondary | ICD-10-CM

## 2022-07-19 DIAGNOSIS — M25552 Pain in left hip: Secondary | ICD-10-CM

## 2022-07-19 NOTE — Therapy (Signed)
OUTPATIENT PHYSICAL THERAPY LOWER EXTREMITY TREATMENT   Patient Name: Janet Wilson MRN: [id number] DOB:12-04-1934, 88 y.o., female Today's Date: 07/19/2022  END OF SESSION:  PT End of Session - 07/19/22 1100     Visit Number 7    Number of Visits 8    Date for PT Re-Evaluation 08/08/22    Authorization Type [employer] MCR    PT Start Time 1018    PT Stop Time 1100    PT Time Calculation (min) 42 min    Equipment Utilized During Treatment Gait belt    Activity Tolerance Patient tolerated treatment well    Behavior During Therapy WFL for tasks assessed/performed                 Past Medical History:  Diagnosis Date   Aortic aneurysm (HCC)    Arthritis    OA BOTH KNEES AND HANDS   Atrial tachycardia    HX OF   GERD (gastroesophageal reflux disease)    Glaucoma    left eye   Goiter    CAUSING COUGH, HOARSINESS AND DRY THROAT   Headache(784.0)    MIGRAINES    Hyperlipidemia    Neuralgia    Overactive bladder    RLS (restless legs syndrome)    Temporal arteritis (HCC)    Past Surgical History:  Procedure Laterality Date   ARTERY BIOPSY Right 07/21/2012   Procedure: BIOPSY TEMPORAL ARTERY;  Surgeon: Currie Paris, MD;  Location: [facility];  Service: General;  Laterality: Right;   CERVICAL FUSION  2012   Dr. Venetia Maxon   EYE SURGERY     CATARACT EXTRACTION- BILATERAL   INCONTINENCE SURGERY     JOINT REPLACEMENT     right knee   LEFT SHOULDER SURGERY     RIGHT KNEE ARTHROSCOPY  11/2009     ROBOTIC ASSISTED BILATERAL SALPINGO OOPHERECTOMY Bilateral 12/08/2013   Procedure: ROBOTIC ASSISTED LAPAROSCOPIC BILATERAL SALPINGO OOPHORECTOMY WITH STAGING;  Surgeon: Adolphus Birchwood, MD;  Location: [facility];  Service: Gynecology;  Laterality: Bilateral;   TOTAL HIP ARTHROPLASTY Left 04/03/2022   Procedure: TOTAL HIP ARTHROPLASTY ANTERIOR APPROACH;  Surgeon: Sheral Apley, MD;  Location: [facility];  Service: Orthopedics;  Laterality: Left;   TOTAL KNEE ARTHROPLASTY  07/04/2011    Procedure: TOTAL KNEE ARTHROPLASTY;  Surgeon: Loanne Drilling, MD;  Location: [facility];  Service: Orthopedics;  Laterality: Right;   Patient Active Problem List   Diagnosis Date Noted   Aortic valve disease 04/04/2022   Closed left hip fracture (HCC) 04/02/2022   Aortic aneurysm (HCC) 04/02/2022   Dementia (HCC) 01/21/2022   Post-nasal drip 12/20/2021   Gastroesophageal reflux disease without esophagitis 12/20/2021   Depression with anxiety 12/20/2021   Cellulitis of right leg 12/20/2021   Primary insomnia 12/20/2021   Hyperlipidemia 12/20/2021   Coronary artery calcification 12/18/2019   Aortic atherosclerosis (HCC) 12/18/2019   Pure hypercholesterolemia 12/18/2019   Senile osteopenia 03/20/2018   H/O temporal arteritis 03/20/2018   Memory loss, short term 03/20/2018   Weight loss 03/20/2018   Nausea 03/20/2018   Mixed urge and stress incontinence 03/20/2018   Multinodular goiter (nontoxic) 03/20/2018   RLS (restless legs syndrome) 12/19/2017   Pain in right knee 10/09/2017   Ectopic atrial tachycardia 10/08/2016   Cough 09/06/2015   Hoarseness of voice 09/06/2015   Rhinitis, chronic 09/06/2015   Bilateral occipital neuralgia 06/16/2015   Convergence insufficiency 08/09/2014   Neck pain 03/05/2013   Capsular glaucoma with pseudoexfoliation of lens, left eye, moderate  stage 12/22/2012   Right temporal headache 06/30/2012   Family history of coronary artery disease 09/24/2011   S/P TKR (total knee replacement) 09/24/2011   Tachycardia 08/10/2011   OA (osteoarthritis) of knee 07/04/2011     REFERRING PROVIDER: Sheral Apley, MD  REFERRING DIAG: left total hip arthroplasty for fracture 04/03/2022; lumbago  THERAPY DIAG:  Muscle weakness (generalized)  Pain in left hip  Other low back pain  Rationale for Evaluation and Treatment: Rehabilitation  ONSET DATE: DOS 04/03/2022  SUBJECTIVE:   SUBJECTIVE STATEMENT: Pt arrives with SPC. She reports some L knee pain  at entry and some L groin discomfort, stating that the hip abduction exercise bothers her.          PERTINENT HISTORY: -L THA with anterior approach on 04/03/2022  -Ascending aortic aneurysm 5.7 cm.  Follows with Dr. Lavinia Sharps.  Pt has a 10# lifting restriction   -L4-5 anterolisthesis, L knee pain with meniscus tear, MCI (mild cognitive impairment), R TKR in 2013  -Arthritis, CAD, cervical fusion    PAIN:  Are you having pain?  Location:  L hip NPRS:  0/10 current and best, 2/10 worst  Location:  lumbar NPRS:  0/10 current and best, 2-3/10 worst  Location:  L lateral knee NPRS:  6/10  PRECAUTIONS: Anterior hip and Other: Ascending aortic aneurysm 5.7 cm with 10# lifting restriction, L4-5 anterolisthesis, R TKR, cervical fusion, L knee meniscus tear and L knee wanting to give way  WEIGHT BEARING RESTRICTIONS:  none indicated  FALLS:  Has patient fallen in last 6 months? Yes. Number of falls 2  LIVING ENVIRONMENT: Lives with: lives with their spouse Lives in: 1 level home.  Independent living at [employer]: no Has following equipment at home: Single point cane and Walker - 2 wheeled   PLOF: Independent  Pt ambulated 3 laps at [employer] and did not use an AD.  Pt was able to garden.  PATIENT GOALS: able to do her gardening, able to attend events at [facility]  NEXT MD VISIT: 07/25/2022  OBJECTIVE:   DIAGNOSTIC FINDINGS: X rays in 2015:  Grade 1 anterolisthesis L4-5, mild multilevel deg changes of the lumbar spine  PATIENT SURVEYS:  FOTO 43 with a goal of 61 at visit #14  COGNITION: Overall cognitive status: Pt was able to answer questions and followed commands well.  Pt has MCI which pt's daughter confirms.      PALPATION: No tenderness in palpation of central lumbar and lumbar flanks  LOWER EXTREMITY MMT:  MMT Right eval Left eval  Hip flexion 4/5 4-/5  Hip extension    Hip abduction 17.8 11.4  Hip adduction    Hip internal rotation    Hip  external rotation    Knee flexion 4/5 seated 4/5 seated  Knee extension 4+/5 4-/5  Ankle dorsiflexion    Ankle plantarflexion    Ankle inversion    Ankle eversion     (Blank rows = not tested)   FUNCTIONAL TESTS:  5x STS test: 21.24 3 reps without UE support/2 reps with UE support  pt able to perform without UE support.   GAIT: Assistive device utilized: None Comments: Pt ambulates with a good heel toe gait with reciprocal arm swing.  Pt had no limp.  Pt has decreased pelvic rotation bilat   TODAY'S TREATMENT:      Treatment:  DATE: 07/19/22 Therapeutic Exercise: Nu-step L3 x 5 min  Manual stretching of bilat HS in supine 2x20-30 sec  STM to adductors Manual adductor stretching LAQ 2x10 bilat 2# STS from low plinth 2x10 (cues for decreasing valgus and hip extension upon standing) R sidelying L clam shell 3x10 Supine glute set with clam shell x30 with GTB standing heel raises 2 x 10 reps with UE support on airex Standing on airex with NBOS 3x30sec with SBA Standing marching on airex x20 CGA   PATIENT EDUCATION:  Education details: ROM findings, dx, prognosis, POC, exercise form, rationale of interventions, and relevant anatomy.  Person educated: Patient and   Education method: Explanation, demonstration, verbal and tactile cues Education comprehension: verbalized understanding, returned demonstration, verbal and tactile cues required  HOME EXERCISE PROGRAM: Pt has a HEP from rehab though pt is not performing HEP per pt and daughter.   ASSESSMENT:  CLINICAL IMPRESSION: Pt able to progress with strengthening exercises today with good tolerance. She is tight into L hip adductors so worked on [employer] and stretching to this area with improvements noted afterwards. Pt required increased cuing with sit to stands today due to poor hip extension and presence of genu valgum.    OBJECTIVE IMPAIRMENTS: decreased activity tolerance,  decreased endurance, decreased mobility, difficulty walking, decreased strength, and pain.   ACTIVITY LIMITATIONS: squatting, transfers, and locomotion level  PARTICIPATION LIMITATIONS: cleaning, community activity, and gardening  PERSONAL FACTORS: 3+ comorbidities: Ascending aortic aneurysm 5.7 cm, MCI, R TKR, cervical fusion, L knee meniscus tear, arthritis  are also affecting patient's functional outcome.   REHAB POTENTIAL: Good  CLINICAL DECISION MAKING: Stable/uncomplicated  EVALUATION COMPLEXITY: Low   GOALS:  SHORT TERM GOALS: Target date: 07/18/2022  Pt will be independent and compliant with HEP for improved strength, pain, mobility, and function.  Baseline: Goal status: INITIAL  2.  Pt will tolerate exercises without adverse effects for improved L LE strength and tolerance with daily mobility.  Baseline:  Goal status: INITIAL  3.  Pt will report at least a 40% improvement in daily mobility.  Baseline:  Goal status: INITIAL    LONG TERM GOALS: Target date: 08/08/2022   Pt will demo improved L LE strength in hip flexion to 4/5 and L knee extension to 4+/5 to = R LE and at least 4-5# increase in hip abd to improve performance of and tolerance with functional mobility including extending ambulation distance.  Baseline:  Goal status: INITIAL  2.  Pt will report increased ambulation distance at [facility] without increased pain safely in order to attend more events.  Baseline:  Goal status: INITIAL  3.  Pt will be able to perform car transfers independently with no > than minimal difficulty.  Baseline:  Goal status: INITIAL  4.  Pt will report she is able to perform her normal functional mobility skills safely without significant difficulty and pain.  Baseline:  Goal status: INITIAL     PLAN:  PT FREQUENCY: 1-2x/week  PT DURATION: 6 weeks  PLANNED INTERVENTIONS: Therapeutic exercises, Therapeutic activity, Neuromuscular re-education, Balance training, Gait  training, Patient/Family education, Self Care, Stair training, Aquatic Therapy, Dry Needling, Electrical stimulation, Cryotherapy, Moist heat, Taping, Manual therapy, and Re-evaluation  PLAN FOR NEXT SESSION: review current HEP (pt did not bring to this visit).  Progress LE strengthening per protocol.  Balance training.  Monitor L LE stability--Pt states her L knee wants to give out daily.   Riki Altes, PTA  07/19/22 12:58 PM

## 2022-07-23 ENCOUNTER — Ambulatory Visit (HOSPITAL_BASED_OUTPATIENT_CLINIC_OR_DEPARTMENT_OTHER): Payer: Medicare Other

## 2022-07-23 ENCOUNTER — Encounter (HOSPITAL_BASED_OUTPATIENT_CLINIC_OR_DEPARTMENT_OTHER): Payer: Self-pay

## 2022-07-23 DIAGNOSIS — M25552 Pain in left hip: Secondary | ICD-10-CM | POA: Diagnosis not present

## 2022-07-23 DIAGNOSIS — M5459 Other low back pain: Secondary | ICD-10-CM | POA: Diagnosis not present

## 2022-07-23 DIAGNOSIS — M6281 Muscle weakness (generalized): Secondary | ICD-10-CM

## 2022-07-23 NOTE — Therapy (Signed)
OUTPATIENT PHYSICAL THERAPY LOWER EXTREMITY TREATMENT   Patient Name: Janet Wilson MRN: [id number] DOB:1934-10-29, 88 y.o., female Today's Date: 07/23/2022  END OF SESSION:  PT End of Session - 07/23/22 1038     Visit Number 8    Number of Visits 8    Date for PT Re-Evaluation 08/08/22    Authorization Type [employer] MCR    PT Start Time 1020    PT Stop Time 1100    PT Time Calculation (min) 40 min    Activity Tolerance Patient tolerated treatment well    Behavior During Therapy WFL for tasks assessed/performed                  Past Medical History:  Diagnosis Date   Aortic aneurysm (HCC)    Arthritis    OA BOTH KNEES AND HANDS   Atrial tachycardia    HX OF   GERD (gastroesophageal reflux disease)    Glaucoma    left eye   Goiter    CAUSING COUGH, HOARSINESS AND DRY THROAT   Headache(784.0)    MIGRAINES    Hyperlipidemia    Neuralgia    Overactive bladder    RLS (restless legs syndrome)    Temporal arteritis (HCC)    Past Surgical History:  Procedure Laterality Date   ARTERY BIOPSY Right 07/21/2012   Procedure: BIOPSY TEMPORAL ARTERY;  Surgeon: Currie Paris, MD;  Location: [facility];  Service: General;  Laterality: Right;   CERVICAL FUSION  2012   Dr. Venetia Maxon   EYE SURGERY     CATARACT EXTRACTION- BILATERAL   INCONTINENCE SURGERY     JOINT REPLACEMENT     right knee   LEFT SHOULDER SURGERY     RIGHT KNEE ARTHROSCOPY  11/2009     ROBOTIC ASSISTED BILATERAL SALPINGO OOPHERECTOMY Bilateral 12/08/2013   Procedure: ROBOTIC ASSISTED LAPAROSCOPIC BILATERAL SALPINGO OOPHORECTOMY WITH STAGING;  Surgeon: Adolphus Birchwood, MD;  Location: [facility];  Service: Gynecology;  Laterality: Bilateral;   TOTAL HIP ARTHROPLASTY Left 04/03/2022   Procedure: TOTAL HIP ARTHROPLASTY ANTERIOR APPROACH;  Surgeon: Sheral Apley, MD;  Location: [facility];  Service: Orthopedics;  Laterality: Left;   TOTAL KNEE ARTHROPLASTY  07/04/2011   Procedure: TOTAL KNEE ARTHROPLASTY;   Surgeon: Loanne Drilling, MD;  Location: [facility];  Service: Orthopedics;  Laterality: Right;   Patient Active Problem List   Diagnosis Date Noted   Aortic valve disease 04/04/2022   Closed left hip fracture (HCC) 04/02/2022   Aortic aneurysm (HCC) 04/02/2022   Dementia (HCC) 01/21/2022   Post-nasal drip 12/20/2021   Gastroesophageal reflux disease without esophagitis 12/20/2021   Depression with anxiety 12/20/2021   Cellulitis of right leg 12/20/2021   Primary insomnia 12/20/2021   Hyperlipidemia 12/20/2021   Coronary artery calcification 12/18/2019   Aortic atherosclerosis (HCC) 12/18/2019   Pure hypercholesterolemia 12/18/2019   Senile osteopenia 03/20/2018   H/O temporal arteritis 03/20/2018   Memory loss, short term 03/20/2018   Weight loss 03/20/2018   Nausea 03/20/2018   Mixed urge and stress incontinence 03/20/2018   Multinodular goiter (nontoxic) 03/20/2018   RLS (restless legs syndrome) 12/19/2017   Pain in right knee 10/09/2017   Ectopic atrial tachycardia 10/08/2016   Cough 09/06/2015   Hoarseness of voice 09/06/2015   Rhinitis, chronic 09/06/2015   Bilateral occipital neuralgia 06/16/2015   Convergence insufficiency 08/09/2014   Neck pain 03/05/2013   Capsular glaucoma with pseudoexfoliation of lens, left eye, moderate stage 12/22/2012   Right temporal headache 06/30/2012  Family history of coronary artery disease 09/24/2011   S/P TKR (total knee replacement) 09/24/2011   Tachycardia 08/10/2011   OA (osteoarthritis) of knee 07/04/2011     REFERRING PROVIDER: Sheral Apley, MD  REFERRING DIAG: left total hip arthroplasty for fracture 04/03/2022; lumbago  THERAPY DIAG:  Muscle weakness (generalized)  Other low back pain  Pain in left hip  Rationale for Evaluation and Treatment: Rehabilitation  ONSET DATE: DOS 04/03/2022  SUBJECTIVE:   SUBJECTIVE STATEMENT: Pt arrives with SPC. She reports some L knee pain at entry and some L groin discomfort,  stating that the hip abduction exercise bothers her.          PERTINENT HISTORY: -L THA with anterior approach on 04/03/2022  -Ascending aortic aneurysm 5.7 cm.  Follows with Dr. Lavinia Sharps.  Pt has a 10# lifting restriction   -L4-5 anterolisthesis, L knee pain with meniscus tear, MCI (mild cognitive impairment), R TKR in 2013  -Arthritis, CAD, cervical fusion    PAIN:  Are you having pain?  Location:  L hip NPRS:  0/10 current and best, 2/10 worst  Location:  lumbar NPRS:  0/10 current and best, 2-3/10 worst  Location:  L lateral knee NPRS:  6/10  PRECAUTIONS: Anterior hip and Other: Ascending aortic aneurysm 5.7 cm with 10# lifting restriction, L4-5 anterolisthesis, R TKR, cervical fusion, L knee meniscus tear and L knee wanting to give way  WEIGHT BEARING RESTRICTIONS:  none indicated  FALLS:  Has patient fallen in last 6 months? Yes. Number of falls 2  LIVING ENVIRONMENT: Lives with: lives with their spouse Lives in: 1 level home.  Independent living at [employer]: no Has following equipment at home: Single point cane and Walker - 2 wheeled   PLOF: Independent  Pt ambulated 3 laps at [employer] and did not use an AD.  Pt was able to garden.  PATIENT GOALS: able to do her gardening, able to attend events at [facility]  NEXT MD VISIT: 07/25/2022  OBJECTIVE:   DIAGNOSTIC FINDINGS: X rays in 2015:  Grade 1 anterolisthesis L4-5, mild multilevel deg changes of the lumbar spine  PATIENT SURVEYS:  FOTO 43 with a goal of 61 at visit #14  COGNITION: Overall cognitive status: Pt was able to answer questions and followed commands well.  Pt has MCI which pt's daughter confirms.      PALPATION: No tenderness in palpation of central lumbar and lumbar flanks  LOWER EXTREMITY MMT:  MMT Right eval Left eval  Hip flexion 4/5 4-/5  Hip extension    Hip abduction 17.8 11.4  Hip adduction    Hip internal rotation    Hip external rotation    Knee flexion 4/5  seated 4/5 seated  Knee extension 4+/5 4-/5  Ankle dorsiflexion    Ankle plantarflexion    Ankle inversion    Ankle eversion     (Blank rows = not tested)   FUNCTIONAL TESTS:  5x STS test: 21.24 3 reps without UE support/2 reps with UE support  pt able to perform without UE support.   GAIT: Assistive device utilized: None Comments: Pt ambulates with a good heel toe gait with reciprocal arm swing.  Pt had no limp.  Pt has decreased pelvic rotation bilat   TODAY'S TREATMENT:       Treatment:  DATE: 07/23/22 Therapeutic Exercise: Nu-step L3 x 5 min  Manual stretching of bilat HS in supine 2x20-30 sec  STM to L adductors Manual adductor stretching LAQ 2x10 bilat 4# STS from low plinth 2x10 (cues for decreasing valgus and hip extension upon standing) Sidelying clams GTB 2x10ea Supine glute set with clam shell x30 with GTB HEP handout and review   Treatment:                                                DATE: 07/19/22 Therapeutic Exercise: Nu-step L3 x 5 min  Manual stretching of bilat HS in supine 2x20-30 sec  STM to adductors Manual adductor stretching LAQ 2x10 bilat 2# STS from low plinth 2x10 (cues for decreasing valgus and hip extension upon standing) R sidelying L clam shell 3x10 Supine glute set with clam shell x30 with GTB standing heel raises 2 x 10 reps with UE support on airex Standing on airex with NBOS 3x30sec with SBA Standing marching on airex x20 CGA   PATIENT EDUCATION:  Education details: ROM findings, dx, prognosis, POC, exercise form, rationale of interventions, and relevant anatomy.  Person educated: Patient and   Education method: Explanation, demonstration, verbal and tactile cues Education comprehension: verbalized understanding, returned demonstration, verbal and tactile cues required  HOME EXERCISE PROGRAM: Access Code: [email or web address] URL: [email or web address] Date: 07/23/2022 Prepared  by: Riki Altes  Exercises - Clam with Resistance  - 1-2 x daily - 7 x weekly - 2-3 sets - 10 reps - Sit to Stand Without Arm Support  - 1-2 x daily - 7 x weekly - 2-3 sets - 10 reps - Narrow Stance with Counter Support  - 1-2 x daily - 7 x weekly - 3 sets - 20-30seconds hold - Heel Toe Raises with Counter Support  - 1-2 x daily - 7 x weekly - 3 sets - 10 reps - Standing March with Counter Support  - 1-2 x daily - 7 x weekly - 3 sets - 10 reps  ASSESSMENT:  CLINICAL IMPRESSION: Pt had initial tightness into L adductors today which improved following STM. Good performance with sit to stands following initial cuing for correct form. Pt able to increase resistance today with LAQ without significant difficulty. Provided pt with HEP to work on at home with verbalized understanding.   OBJECTIVE IMPAIRMENTS: decreased activity tolerance, decreased endurance, decreased mobility, difficulty walking, decreased strength, and pain.   ACTIVITY LIMITATIONS: squatting, transfers, and locomotion level  PARTICIPATION LIMITATIONS: cleaning, community activity, and gardening  PERSONAL FACTORS: 3+ comorbidities: Ascending aortic aneurysm 5.7 cm, MCI, R TKR, cervical fusion, L knee meniscus tear, arthritis  are also affecting patient's functional outcome.   REHAB POTENTIAL: Good  CLINICAL DECISION MAKING: Stable/uncomplicated  EVALUATION COMPLEXITY: Low   GOALS:  SHORT TERM GOALS: Target date: 07/18/2022  Pt will be independent and compliant with HEP for improved strength, pain, mobility, and function.  Baseline: Goal status: MET 6/10  2.  Pt will tolerate exercises without adverse effects for improved L LE strength and tolerance with daily mobility.  Baseline:  Goal status: MET 6/10  3.  Pt will report at least a 40% improvement in daily mobility.  Baseline:  Goal status: MET 6/10    LONG TERM GOALS: Target date: 08/08/2022   Pt will demo improved L LE strength in hip flexion to 4/5 and  L knee  extension to 4+/5 to = R LE and at least 4-5# increase in hip abd to improve performance of and tolerance with functional mobility including extending ambulation distance.  Baseline:  Goal status: INITIAL  2.  Pt will report increased ambulation distance at [facility] without increased pain safely in order to attend more events.  Baseline:  Goal status: MET 6/10  3.  Pt will be able to perform car transfers independently with no > than minimal difficulty.  Baseline:  Goal status: IN PROGRESS (still having some difficulty 6/10)  4.  Pt will report she is able to perform her normal functional mobility skills safely without significant difficulty and pain.  Baseline:  Goal status: IN PROGRESS     PLAN:  PT FREQUENCY: 1-2x/week  PT DURATION: 6 weeks  PLANNED INTERVENTIONS: Therapeutic exercises, Therapeutic activity, Neuromuscular re-education, Balance training, Gait training, Patient/Family education, Self Care, Stair training, Aquatic Therapy, Dry Needling, Electrical stimulation, Cryotherapy, Moist heat, Taping, Manual therapy, and Re-evaluation  PLAN FOR NEXT SESSION: review current HEP (pt did not bring to this visit).  Progress LE strengthening per protocol.  Balance training.  Monitor L LE stability--Pt states her L knee wants to give out daily.   Riki Altes, PTA  07/23/22 11:32 AM

## 2022-07-24 ENCOUNTER — Non-Acute Institutional Stay: Payer: Medicare Other | Admitting: Internal Medicine

## 2022-07-24 ENCOUNTER — Encounter: Payer: Self-pay | Admitting: Internal Medicine

## 2022-07-24 VITALS — BP 126/62 | HR 96 | Temp 97.7°F | Resp 18 | Ht 63.5 in | Wt 103.0 lb

## 2022-07-24 DIAGNOSIS — E782 Mixed hyperlipidemia: Secondary | ICD-10-CM

## 2022-07-24 DIAGNOSIS — F5101 Primary insomnia: Secondary | ICD-10-CM | POA: Diagnosis not present

## 2022-07-24 DIAGNOSIS — R141 Gas pain: Secondary | ICD-10-CM | POA: Insufficient documentation

## 2022-07-24 DIAGNOSIS — I7121 Aneurysm of the ascending aorta, without rupture: Secondary | ICD-10-CM

## 2022-07-24 DIAGNOSIS — R490 Dysphonia: Secondary | ICD-10-CM | POA: Diagnosis not present

## 2022-07-24 DIAGNOSIS — E042 Nontoxic multinodular goiter: Secondary | ICD-10-CM

## 2022-07-24 DIAGNOSIS — R634 Abnormal weight loss: Secondary | ICD-10-CM | POA: Diagnosis not present

## 2022-07-24 DIAGNOSIS — K219 Gastro-esophageal reflux disease without esophagitis: Secondary | ICD-10-CM

## 2022-07-24 DIAGNOSIS — R1032 Left lower quadrant pain: Secondary | ICD-10-CM | POA: Insufficient documentation

## 2022-07-24 DIAGNOSIS — Z96642 Presence of left artificial hip joint: Secondary | ICD-10-CM

## 2022-07-24 DIAGNOSIS — G3184 Mild cognitive impairment, so stated: Secondary | ICD-10-CM

## 2022-07-24 DIAGNOSIS — M25559 Pain in unspecified hip: Secondary | ICD-10-CM | POA: Insufficient documentation

## 2022-07-24 DIAGNOSIS — R1310 Dysphagia, unspecified: Secondary | ICD-10-CM | POA: Insufficient documentation

## 2022-07-24 MED ORDER — RIVASTIGMINE TARTRATE 3 MG PO CAPS: 3.0000 mg | ORAL_CAPSULE | Freq: Two times a day (BID) | ORAL | 3 refills | Status: DC

## 2022-07-24 MED ORDER — MIRTAZAPINE 15 MG PO TABS: 15.0000 mg | ORAL_TABLET | Freq: Every day | ORAL | 3 refills | Status: DC

## 2022-07-24 NOTE — Progress Notes (Unsigned)
Location:  [facility] features editor of Service:  Clinic (12)  Provider:   Code Status: *** Goals of Care:     07/24/2022    2:47 PM  Advanced Directives  Does Patient Have a Medical Advance Directive? Yes  Type of Estate agent of [place];Living will;Out of facility DNR (pink MOST or yellow form)  Does patient want to make changes to medical advance directive? No - Patient declined     Chief Complaint  Patient presents with   Acute Visit    Patient is being seen for a referral to ENT   Quality Metric Gaps    Patient is due for a Awv and is scheduled    HPI: Patient is a 88 y.o. female seen today for medical management of chronic diseases.     Past Medical History:  Diagnosis Date   Aortic aneurysm (HCC)    Arthritis    OA BOTH KNEES AND HANDS   Atrial tachycardia    HX OF   GERD (gastroesophageal reflux disease)    Glaucoma    left eye   Goiter    CAUSING COUGH, HOARSINESS AND DRY THROAT   Headache(784.0)    MIGRAINES    Hyperlipidemia    Neuralgia    Overactive bladder    RLS (restless legs syndrome)    Temporal arteritis (HCC)     Past Surgical History:  Procedure Laterality Date   ARTERY BIOPSY Right 07/21/2012   Procedure: BIOPSY TEMPORAL ARTERY;  Surgeon: Currie Paris, MD;  Location: [facility];  Service: General;  Laterality: Right;   CERVICAL FUSION  2012   Dr. Venetia Maxon   EYE SURGERY     CATARACT EXTRACTION- BILATERAL   INCONTINENCE SURGERY     JOINT REPLACEMENT     right knee   LEFT SHOULDER SURGERY     RIGHT KNEE ARTHROSCOPY  11/2009     ROBOTIC ASSISTED BILATERAL SALPINGO OOPHERECTOMY Bilateral 12/08/2013   Procedure: ROBOTIC ASSISTED LAPAROSCOPIC BILATERAL SALPINGO OOPHORECTOMY WITH STAGING;  Surgeon: Adolphus Birchwood, MD;  Location: [facility];  Service: Gynecology;  Laterality: Bilateral;   TOTAL HIP ARTHROPLASTY Left 04/03/2022   Procedure: TOTAL HIP ARTHROPLASTY ANTERIOR APPROACH;  Surgeon: Sheral Apley,  MD;  Location: [facility];  Service: Orthopedics;  Laterality: Left;   TOTAL KNEE ARTHROPLASTY  07/04/2011   Procedure: TOTAL KNEE ARTHROPLASTY;  Surgeon: Loanne Drilling, MD;  Location: [facility];  Service: Orthopedics;  Laterality: Right;    Allergies  Allergen Reactions   Lisinopril Other (See Comments) and Cough    Very low BP/"very tired" also   Mold Extract [Trichophyton] Other (See Comments)    Headaches and migraines   Molds & Smuts Other (See Comments)    Headaches and migraines   Sulfa Antibiotics    Codeine Nausea Only   Oxycodone Nausea Only    Outpatient Encounter Medications as of 07/24/2022  Medication Sig   brimonidine (ALPHAGAN) 0.2 % ophthalmic solution Place 1 drop into both eyes 2 (two) times daily.   Calcium Carbonate-Vitamin D (CALCIUM PLUS VITAMIN D PO) Take 1 tablet by mouth 2 (two) times a week.   Coenzyme Q10 (CO Q 10) 100 MG CAPS Take 100 mg by mouth 2 (two) times a week.   mirtazapine (REMERON) 7.5 MG tablet TAKE 1 TABLET BY MOUTH AT BEDTIME.   Omega-3 Fatty Acids (FISH OIL) 1200 MG CAPS Take 1,200 mg by mouth 2 (two) times a week.   omeprazole (PRILOSEC) 40 MG  capsule TAKE 1 CAPSULE (40 MG TOTAL) BY MOUTH DAILY.   polyethylene glycol (MIRALAX / GLYCOLAX) 17 g packet Take 17 g by mouth daily.   rivastigmine (EXELON) 1.5 MG capsule TAKE 1 CAPSULE EACH MORNING.   rivastigmine (EXELON) 3 MG capsule TAKE 1 CAPSULE BY MOUTH AT BEDTIME.   rosuvastatin (CRESTOR) 10 MG tablet TAKE 1 TABLET BY MOUTH EVERY DAY   [DISCONTINUED] HYDROcodone-acetaminophen (NORCO) 5-325 MG tablet Take 1-2 tablets by mouth every 6 (six) hours as needed for severe pain. MAXIMUM TOTAL ACETAMINOPHEN DOSE IS 4000 MG PER DAY   senna-docusate (SENOKOT-S) 8.6-50 MG tablet Take 1 tablet by mouth 2 (two) times daily. (Patient not taking: Reported on 06/28/2022)   SYSTANE ULTRA PF 0.4-0.3 % SOLN Apply 1 drop to eye 3 (three) times daily as needed (for dryness). (Patient not taking: Reported on 07/24/2022)    [DISCONTINUED] aspirin EC 81 MG tablet Take 1 tablet (81 mg total) by mouth daily. (Patient not taking: Reported on 06/28/2022)   No facility-administered encounter medications on file as of 07/24/2022.    Review of Systems:  Review of Systems  Health Maintenance  Topic Date Due   Medicare Annual Wellness (AWV)  04/18/2021   COVID-19 Vaccine (2 - Moderna risk series) 08/09/2022 (Originally 12/20/2021)   INFLUENZA VACCINE  09/13/2022   DTaP/Tdap/Td (2 - Tdap) 12/12/2027   Pneumonia Vaccine 71+ Years old  Completed   DEXA SCAN  Completed   Zoster Vaccines- Shingrix  Completed   HPV VACCINES  Aged Out    Physical Exam: Vitals:   07/24/22 1444  BP: 126/62  Pulse: 96  Resp: 18  Temp: 97.7 F (36.5 C)  TempSrc: Temporal  SpO2: 96%  Weight: 103 lb (46.7 kg)  Height: 5' 3.5" (1.613 m)   Body mass index is 17.96 kg/m. Physical Exam  Labs reviewed: Basic Metabolic Panel: Recent Labs    11/09/21 1452 12/29/21 1255 04/02/22 1650 04/03/22 0330 04/04/22 0808  NA 139  --  144  --  138  K 4.1  --  3.7  --  3.7  CL 101  --  110  --  105  CO2 25  --  26  --  26  GLUCOSE 91  --  86  --  121*  BUN 23  --  25*  --  20  CREATININE 0.78 0.80 0.60  --  0.68  CALCIUM 9.6  --  8.5*  --  8.5*  MG  --   --  1.9  --   --   PHOS  --   --  3.2  --   --   TSH  --   --   --  0.281*  --    Liver Function Tests: Recent Labs    11/05/21 2127 04/02/22 1650  AST 15 25  ALT 13 20  ALKPHOS 76 58  BILITOT 0.5 0.6  PROT 6.9 6.2*  ALBUMIN 4.3 3.6   No results for input(s): "LIPASE", "AMYLASE" in the last 8760 hours. No results for input(s): "AMMONIA" in the last 8760 hours. CBC: Recent Labs    11/05/21 2127 04/02/22 1650 04/04/22 0808  WBC 6.3 5.5 9.2  NEUTROABS 4.1 4.1  --   HGB 13.8 12.2 12.3  HCT 41.5 38.9 38.3  MCV 96.1 97.7 95.5  PLT 156 116* 144*   Lipid Panel: No results for input(s): "CHOL", "HDL", "LDLCALC", "TRIG", "CHOLHDL", "LDLDIRECT" in the last 8760 hours. Lab  Results  Component Value Date   HGBA1C 5.4  05/21/2014    Procedures since last visit: No results found.  Assessment/Plan 1. Hoarseness of voice  - Ambulatory referral to ENT  2. S/P total left hip arthroplasty   3. MCI (mild cognitive impairment) ***  4. Primary insomnia ***  5. Mixed hyperlipidemia ***  6. Aneurysm of ascending aorta without rupture (HCC) ***  7. Gastroesophageal reflux disease without esophagitis ***  8. Weight loss ***  9. Multinodular goiter (nontoxic) ***   Labs/tests ordered:  Thyroid function Next appt:  10/01/2022

## 2022-07-25 DIAGNOSIS — S72042D Displaced fracture of base of neck of left femur, subsequent encounter for closed fracture with routine healing: Secondary | ICD-10-CM | POA: Diagnosis not present

## 2022-07-26 ENCOUNTER — Ambulatory Visit (HOSPITAL_BASED_OUTPATIENT_CLINIC_OR_DEPARTMENT_OTHER): Payer: Medicare Other

## 2022-07-26 ENCOUNTER — Encounter (HOSPITAL_BASED_OUTPATIENT_CLINIC_OR_DEPARTMENT_OTHER): Payer: Self-pay

## 2022-07-26 DIAGNOSIS — M6281 Muscle weakness (generalized): Secondary | ICD-10-CM

## 2022-07-26 DIAGNOSIS — M5459 Other low back pain: Secondary | ICD-10-CM

## 2022-07-26 DIAGNOSIS — M25552 Pain in left hip: Secondary | ICD-10-CM | POA: Diagnosis not present

## 2022-07-26 NOTE — Therapy (Signed)
OUTPATIENT PHYSICAL THERAPY LOWER EXTREMITY TREATMENT   Patient Name: Janet Wilson MRN: [id number] DOB:May 21, 1934, 88 y.o., female Today's Date: 07/27/2022  END OF SESSION:  PT End of Session - 07/26/22 1021     Visit Number 9    Number of Visits 8    Date for PT Re-Evaluation 08/08/22    Authorization Type [employer] MCR    PT Start Time 1020    PT Stop Time 1100    PT Time Calculation (min) 40 min    Activity Tolerance Patient tolerated treatment well    Behavior During Therapy WFL for tasks assessed/performed                   Past Medical History:  Diagnosis Date   Aortic aneurysm (HCC)    Arthritis    OA BOTH KNEES AND HANDS   Atrial tachycardia    HX OF   GERD (gastroesophageal reflux disease)    Glaucoma    left eye   Goiter    CAUSING COUGH, HOARSINESS AND DRY THROAT   Headache(784.0)    MIGRAINES    Hyperlipidemia    Neuralgia    Overactive bladder    RLS (restless legs syndrome)    Temporal arteritis (HCC)    Past Surgical History:  Procedure Laterality Date   ARTERY BIOPSY Right 07/21/2012   Procedure: BIOPSY TEMPORAL ARTERY;  Surgeon: Currie Paris, MD;  Location: [facility];  Service: General;  Laterality: Right;   CERVICAL FUSION  2012   Dr. Venetia Maxon   EYE SURGERY     CATARACT EXTRACTION- BILATERAL   INCONTINENCE SURGERY     JOINT REPLACEMENT     right knee   LEFT SHOULDER SURGERY     RIGHT KNEE ARTHROSCOPY  11/2009     ROBOTIC ASSISTED BILATERAL SALPINGO OOPHERECTOMY Bilateral 12/08/2013   Procedure: ROBOTIC ASSISTED LAPAROSCOPIC BILATERAL SALPINGO OOPHORECTOMY WITH STAGING;  Surgeon: Adolphus Birchwood, MD;  Location: [facility];  Service: Gynecology;  Laterality: Bilateral;   TOTAL HIP ARTHROPLASTY Left 04/03/2022   Procedure: TOTAL HIP ARTHROPLASTY ANTERIOR APPROACH;  Surgeon: Sheral Apley, MD;  Location: [facility];  Service: Orthopedics;  Laterality: Left;   TOTAL KNEE ARTHROPLASTY  07/04/2011   Procedure: TOTAL KNEE ARTHROPLASTY;   Surgeon: Loanne Drilling, MD;  Location: [facility];  Service: Orthopedics;  Laterality: Right;   Patient Active Problem List   Diagnosis Date Noted   Dysphagia 07/24/2022   Flatulence, eructation and gas pain 07/24/2022   Hip pain 07/24/2022   Left lower quadrant pain 07/24/2022   Aortic valve disease 04/04/2022   Closed left hip fracture (HCC) 04/02/2022   Aortic aneurysm (HCC) 04/02/2022   Dementia (HCC) 01/21/2022   Post-nasal drip 12/20/2021   Gastroesophageal reflux disease without esophagitis 12/20/2021   Depression with anxiety 12/20/2021   Cellulitis of right leg 12/20/2021   Primary insomnia 12/20/2021   Hyperlipidemia 12/20/2021   Conjunctivitis, chronic, follicular 03/06/2021   Osteoarthritis of right glenohumeral joint 04/04/2020   Coronary artery calcification 12/18/2019   Aortic atherosclerosis (HCC) 12/18/2019   Pure hypercholesterolemia 12/18/2019   Tear of lateral meniscus of knee 01/21/2019   Senile osteopenia 03/20/2018   H/O temporal arteritis 03/20/2018   Memory loss, short term 03/20/2018   Weight loss 03/20/2018   Nausea 03/20/2018   Mixed urge and stress incontinence 03/20/2018   Multinodular goiter (nontoxic) 03/20/2018   RLS (restless legs syndrome) 12/19/2017   Pain in right knee 10/09/2017   Ectopic atrial tachycardia 10/08/2016  Cough 09/06/2015   Hoarseness of voice 09/06/2015   Rhinitis, chronic 09/06/2015   Bilateral occipital neuralgia 06/16/2015   Convergence insufficiency 08/09/2014   Neck pain 03/05/2013   Capsular glaucoma with pseudoexfoliation of lens, left eye, moderate stage 12/22/2012   Right temporal headache 06/30/2012   Family history of coronary artery disease 09/24/2011   S/P TKR (total knee replacement) 09/24/2011   Tachycardia 08/10/2011   OA (osteoarthritis) of knee 07/04/2011     REFERRING PROVIDER: Sheral Apley, MD  REFERRING DIAG: left total hip arthroplasty for fracture 04/03/2022; lumbago  THERAPY DIAG:   Muscle weakness (generalized)  Other low back pain  Pain in left hip  Rationale for Evaluation and Treatment: Rehabilitation  ONSET DATE: DOS 04/03/2022  SUBJECTIVE:   SUBJECTIVE STATEMENT: Pt reports 8/10 pain level in L knee today. States she has been having a lot of "grinding" in her knee.          PERTINENT HISTORY: -L THA with anterior approach on 04/03/2022  -Ascending aortic aneurysm 5.7 cm.  Follows with Dr. Lavinia Sharps.  Pt has a 10# lifting restriction   -L4-5 anterolisthesis, L knee pain with meniscus tear, MCI (mild cognitive impairment), R TKR in 2013  -Arthritis, CAD, cervical fusion    PAIN:  Are you having pain?  Location:  L hip NPRS:  0/10 current and best, 2/10 worst  Location:  lumbar NPRS:  0/10 current and best, 2-3/10 worst  Location:  L lateral knee NPRS:  6/10  PRECAUTIONS: Anterior hip and Other: Ascending aortic aneurysm 5.7 cm with 10# lifting restriction, L4-5 anterolisthesis, R TKR, cervical fusion, L knee meniscus tear and L knee wanting to give way  WEIGHT BEARING RESTRICTIONS:  none indicated  FALLS:  Has patient fallen in last 6 months? Yes. Number of falls 2  LIVING ENVIRONMENT: Lives with: lives with their spouse Lives in: 1 level home.  Independent living at [employer]: no Has following equipment at home: Single point cane and Walker - 2 wheeled   PLOF: Independent  Pt ambulated 3 laps at [employer] and did not use an AD.  Pt was able to garden.  PATIENT GOALS: able to do her gardening, able to attend events at [facility]  NEXT MD VISIT: 07/25/2022  OBJECTIVE:   DIAGNOSTIC FINDINGS: X rays in 2015:  Grade 1 anterolisthesis L4-5, mild multilevel deg changes of the lumbar spine  PATIENT SURVEYS:  FOTO 43 with a goal of 61 at visit #14  COGNITION: Overall cognitive status: Pt was able to answer questions and followed commands well.  Pt has MCI which pt's daughter confirms.      PALPATION: No tenderness in  palpation of central lumbar and lumbar flanks  LOWER EXTREMITY MMT:  MMT Right eval Left eval  Hip flexion 4/5 4-/5  Hip extension    Hip abduction 17.8 11.4  Hip adduction    Hip internal rotation    Hip external rotation    Knee flexion 4/5 seated 4/5 seated  Knee extension 4+/5 4-/5  Ankle dorsiflexion    Ankle plantarflexion    Ankle inversion    Ankle eversion     (Blank rows = not tested)   FUNCTIONAL TESTS:  5x STS test: 21.24 3 reps without UE support/2 reps with UE support  pt able to perform without UE support.   GAIT: Assistive device utilized: None Comments: Pt ambulates with a good heel toe gait with reciprocal arm swing.  Pt had no limp.  Pt has decreased pelvic  rotation bilat   TODAY'S TREATMENT:       Treatment:                                                DATE: 07/26/22 Therapeutic Exercise: Nu-step L3 x 5 min  PROM L knee with overpressure Manual stretching of bilat HS in supine 4x20-30 sec  Quad set following MT- 5" x20 Supine SLR (required clinician assist initially) 2x10 LAQ 2x10 bilat 4# STS from low plinth 2x10 (cues for decreasing valgus and hip extension upon standing) Sidelying clams GTB 3x10ea Step ups 4" step 2x10 L  Treatment:                                                DATE: 07/23/22 Therapeutic Exercise: Nu-step L3 x 5 min  Manual stretching of bilat HS in supine 2x20-30 sec  STM to L adductors Manual adductor stretching LAQ 2x10 bilat 4# STS from low plinth 2x10 (cues for decreasing valgus and hip extension upon standing) Sidelying clams GTB 2x10ea Supine glute set with clam shell x30 with GTB HEP handout and review   Treatment:                                                DATE: 07/19/22 Therapeutic Exercise: Nu-step L3 x 5 min  Manual stretching of bilat HS in supine 2x20-30 sec  STM to adductors Manual adductor stretching LAQ 2x10 bilat 2# STS from low plinth 2x10 (cues for decreasing valgus and hip extension upon  standing) R sidelying L clam shell 3x10 Supine glute set with clam shell x30 with GTB standing heel raises 2 x 10 reps with UE support on airex Standing on airex with NBOS 3x30sec with SBA Standing marching on airex x20 CGA   PATIENT EDUCATION:  Education details: ROM findings, dx, prognosis, POC, exercise form, rationale of interventions, and relevant anatomy.  Person educated: Patient and   Education method: Explanation, demonstration, verbal and tactile cues Education comprehension: verbalized understanding, returned demonstration, verbal and tactile cues required  HOME EXERCISE PROGRAM: Access Code: [email or web address] URL: [email or web address] Date: 07/23/2022 Prepared by: Riki Altes  Exercises - Clam with Resistance  - 1-2 x daily - 7 x weekly - 2-3 sets - 10 reps - Sit to Stand Without Arm Support  - 1-2 x daily - 7 x weekly - 2-3 sets - 10 reps - Narrow Stance with Counter Support  - 1-2 x daily - 7 x weekly - 3 sets - 20-30seconds hold - Heel Toe Raises with Counter Support  - 1-2 x daily - 7 x weekly - 3 sets - 10 reps - Standing March with Counter Support  - 1-2 x daily - 7 x weekly - 3 sets - 10 reps  ASSESSMENT:  CLINICAL IMPRESSION: Pt reported improved knee pain following manual stretching and PROM. Able to continue with LE strengthening without complaint. With SLR, she required assistance for first few repetitions, but able to complete independently following this. She demonstrates improved technique with sit to stands, though she does have slight weight shift to R LE. Progress to  step ups today without pain in L knee at 4" step.    OBJECTIVE IMPAIRMENTS: decreased activity tolerance, decreased endurance, decreased mobility, difficulty walking, decreased strength, and pain.   ACTIVITY LIMITATIONS: squatting, transfers, and locomotion level  PARTICIPATION LIMITATIONS: cleaning, community activity, and gardening  PERSONAL FACTORS: 3+ comorbidities:  Ascending aortic aneurysm 5.7 cm, MCI, R TKR, cervical fusion, L knee meniscus tear, arthritis  are also affecting patient's functional outcome.   REHAB POTENTIAL: Good  CLINICAL DECISION MAKING: Stable/uncomplicated  EVALUATION COMPLEXITY: Low   GOALS:  SHORT TERM GOALS: Target date: 07/18/2022  Pt will be independent and compliant with HEP for improved strength, pain, mobility, and function.  Baseline: Goal status: MET 6/10  2.  Pt will tolerate exercises without adverse effects for improved L LE strength and tolerance with daily mobility.  Baseline:  Goal status: MET 6/10  3.  Pt will report at least a 40% improvement in daily mobility.  Baseline:  Goal status: MET 6/10    LONG TERM GOALS: Target date: 08/08/2022   Pt will demo improved L LE strength in hip flexion to 4/5 and L knee extension to 4+/5 to = R LE and at least 4-5# increase in hip abd to improve performance of and tolerance with functional mobility including extending ambulation distance.  Baseline:  Goal status: INITIAL  2.  Pt will report increased ambulation distance at [facility] without increased pain safely in order to attend more events.  Baseline:  Goal status: MET 6/10  3.  Pt will be able to perform car transfers independently with no > than minimal difficulty.  Baseline:  Goal status: IN PROGRESS (still having some difficulty 6/10)  4.  Pt will report she is able to perform her normal functional mobility skills safely without significant difficulty and pain.  Baseline:  Goal status: IN PROGRESS     PLAN:  PT FREQUENCY: 1-2x/week  PT DURATION: 6 weeks  PLANNED INTERVENTIONS: Therapeutic exercises, Therapeutic activity, Neuromuscular re-education, Balance training, Gait training, Patient/Family education, Self Care, Stair training, Aquatic Therapy, Dry Needling, Electrical stimulation, Cryotherapy, Moist heat, Taping, Manual therapy, and Re-evaluation  PLAN FOR NEXT SESSION: review  current HEP (pt did not bring to this visit).  Progress LE strengthening per protocol.  Balance training.  Monitor L LE stability--Pt states her L knee wants to give out daily.   Riki Altes, PTA  07/27/22 9:56 AM

## 2022-07-30 ENCOUNTER — Ambulatory Visit (HOSPITAL_BASED_OUTPATIENT_CLINIC_OR_DEPARTMENT_OTHER): Payer: Medicare Other

## 2022-07-30 ENCOUNTER — Encounter (HOSPITAL_BASED_OUTPATIENT_CLINIC_OR_DEPARTMENT_OTHER): Payer: Self-pay

## 2022-07-30 DIAGNOSIS — M5459 Other low back pain: Secondary | ICD-10-CM

## 2022-07-30 DIAGNOSIS — M6281 Muscle weakness (generalized): Secondary | ICD-10-CM

## 2022-07-30 DIAGNOSIS — M25552 Pain in left hip: Secondary | ICD-10-CM | POA: Diagnosis not present

## 2022-07-30 NOTE — Therapy (Signed)
OUTPATIENT PHYSICAL THERAPY LOWER EXTREMITY TREATMENT   Patient Name: Janet Wilson MRN: [id number] DOB:1934-10-26, 88 y.o., female Today's Date: 07/30/2022  END OF SESSION:  PT End of Session - 07/30/22 0938     Visit Number 10    Date for PT Re-Evaluation 08/08/22    Authorization Type UHC MCR    PT Start Time 0933    PT Stop Time 1015    PT Time Calculation (min) 42 min    Activity Tolerance Patient tolerated treatment well    Behavior During Therapy WFL for tasks assessed/performed                   Past Medical History:  Diagnosis Date   Aortic aneurysm (HCC)    Arthritis    OA BOTH KNEES AND HANDS   Atrial tachycardia    HX OF   GERD (gastroesophageal reflux disease)    Glaucoma    left eye   Goiter    CAUSING COUGH, HOARSINESS AND DRY THROAT   Headache(784.0)    MIGRAINES    Hyperlipidemia    Neuralgia    Overactive bladder    RLS (restless legs syndrome)    Temporal arteritis (HCC)    Past Surgical History:  Procedure Laterality Date   ARTERY BIOPSY Right 07/21/2012   Procedure: BIOPSY TEMPORAL ARTERY;  Surgeon: Currie Paris, MD;  Location: [facility];  Service: General;  Laterality: Right;   CERVICAL FUSION  2012   Dr. Venetia Maxon   EYE SURGERY     CATARACT EXTRACTION- BILATERAL   INCONTINENCE SURGERY     JOINT REPLACEMENT     right knee   LEFT SHOULDER SURGERY     RIGHT KNEE ARTHROSCOPY  11/2009     ROBOTIC ASSISTED BILATERAL SALPINGO OOPHERECTOMY Bilateral 12/08/2013   Procedure: ROBOTIC ASSISTED LAPAROSCOPIC BILATERAL SALPINGO OOPHORECTOMY WITH STAGING;  Surgeon: Adolphus Birchwood, MD;  Location: [facility];  Service: Gynecology;  Laterality: Bilateral;   TOTAL HIP ARTHROPLASTY Left 04/03/2022   Procedure: TOTAL HIP ARTHROPLASTY ANTERIOR APPROACH;  Surgeon: Sheral Apley, MD;  Location: [facility];  Service: Orthopedics;  Laterality: Left;   TOTAL KNEE ARTHROPLASTY  07/04/2011   Procedure: TOTAL KNEE ARTHROPLASTY;  Surgeon: Loanne Drilling,  MD;  Location: [facility];  Service: Orthopedics;  Laterality: Right;   Patient Active Problem List   Diagnosis Date Noted   Dysphagia 07/24/2022   Flatulence, eructation and gas pain 07/24/2022   Hip pain 07/24/2022   Left lower quadrant pain 07/24/2022   Aortic valve disease 04/04/2022   Closed left hip fracture (HCC) 04/02/2022   Aortic aneurysm (HCC) 04/02/2022   Dementia (HCC) 01/21/2022   Post-nasal drip 12/20/2021   Gastroesophageal reflux disease without esophagitis 12/20/2021   Depression with anxiety 12/20/2021   Cellulitis of right leg 12/20/2021   Primary insomnia 12/20/2021   Hyperlipidemia 12/20/2021   Conjunctivitis, chronic, follicular 03/06/2021   Osteoarthritis of right glenohumeral joint 04/04/2020   Coronary artery calcification 12/18/2019   Aortic atherosclerosis (HCC) 12/18/2019   Pure hypercholesterolemia 12/18/2019   Tear of lateral meniscus of knee 01/21/2019   Senile osteopenia 03/20/2018   H/O temporal arteritis 03/20/2018   Memory loss, short term 03/20/2018   Weight loss 03/20/2018   Nausea 03/20/2018   Mixed urge and stress incontinence 03/20/2018   Multinodular goiter (nontoxic) 03/20/2018   RLS (restless legs syndrome) 12/19/2017   Pain in right knee 10/09/2017   Ectopic atrial tachycardia 10/08/2016   Cough 09/06/2015   Hoarseness of voice  09/06/2015   Rhinitis, chronic 09/06/2015   Bilateral occipital neuralgia 06/16/2015   Convergence insufficiency 08/09/2014   Neck pain 03/05/2013   Capsular glaucoma with pseudoexfoliation of lens, left eye, moderate stage 12/22/2012   Right temporal headache 06/30/2012   Family history of coronary artery disease 09/24/2011   S/P TKR (total knee replacement) 09/24/2011   Tachycardia 08/10/2011   OA (osteoarthritis) of knee 07/04/2011     REFERRING PROVIDER: Sheral Apley, MD  REFERRING DIAG: left total hip arthroplasty for fracture 04/03/2022; lumbago  THERAPY DIAG:  Muscle weakness  (generalized)  Other low back pain  Pain in left hip  Rationale for Evaluation and Treatment: Rehabilitation  ONSET DATE: DOS 04/03/2022  SUBJECTIVE:   SUBJECTIVE STATEMENT: Pt reports some L hip pain that started yesterday (front of hip) ad posterior L knee discomfort. Reports continued weakness in knees. "I feel like I'm going to fall sometimes."         PERTINENT HISTORY: -L THA with anterior approach on 04/03/2022  -Ascending aortic aneurysm 5.7 cm.  Follows with Dr. Lavinia Sharps.  Pt has a 10# lifting restriction   -L4-5 anterolisthesis, L knee pain with meniscus tear, MCI (mild cognitive impairment), R TKR in 2013  -Arthritis, CAD, cervical fusion    PAIN:  Are you having pain?  Location:  L hip NPRS:  0/10 current and best, 2/10 worst  Location:  lumbar NPRS:  0/10 current and best, 2-3/10 worst  Location:  L lateral knee NPRS:  6/10  PRECAUTIONS: Anterior hip and Other: Ascending aortic aneurysm 5.7 cm with 10# lifting restriction, L4-5 anterolisthesis, R TKR, cervical fusion, L knee meniscus tear and L knee wanting to give way  WEIGHT BEARING RESTRICTIONS:  none indicated  FALLS:  Has patient fallen in last 6 months? Yes. Number of falls 2  LIVING ENVIRONMENT: Lives with: lives with their spouse Lives in: 1 level home.  Independent living at [employer]: no Has following equipment at home: Single point cane and Walker - 2 wheeled   PLOF: Independent  Pt ambulated 3 laps at [employer] and did not use an AD.  Pt was able to garden.  PATIENT GOALS: able to do her gardening, able to attend events at [facility]  NEXT MD VISIT: 07/25/2022  OBJECTIVE:   DIAGNOSTIC FINDINGS: X rays in 2015:  Grade 1 anterolisthesis L4-5, mild multilevel deg changes of the lumbar spine  PATIENT SURVEYS:  FOTO 43 with a goal of 61 at visit #14  COGNITION: Overall cognitive status: Pt was able to answer questions and followed commands well.  Pt has MCI which pt's daughter  confirms.      PALPATION: No tenderness in palpation of central lumbar and lumbar flanks  LOWER EXTREMITY MMT:  MMT Right eval Left eval Right 6/17 Left 6/17  Hip flexion 4/5 4-/5 4-/5 4-/5  Hip extension      Hip abduction 17.8 11.4 4/5 4-/5  Hip adduction      Hip internal rotation      Hip external rotation      Knee flexion 4/5 seated 4/5 seated 4+/5 4+/5  Knee extension 4+/5 4-/5 4/5 4/5  Ankle dorsiflexion      Ankle plantarflexion      Ankle inversion      Ankle eversion       (Blank rows = not tested)   FUNCTIONAL TESTS:  5x STS test: 21.24 3 reps without UE support/2 reps with UE support  pt able to perform without UE support.  6/17: 5xSTS 15.13 seconds without UE support   GAIT: Assistive device utilized: None Comments: Pt ambulates with a good heel toe gait with reciprocal arm swing.  Pt had no limp.  Pt has decreased pelvic rotation bilat   TODAY'S TREATMENT:       Treatment:                                                  DATE: 07/30/22  5xSTS Updated strength   Therapeutic Exercise: Nu-step L3 x 5 min  PROM L knee with overpressure STM to L hip flexor SAQ 2# x20, 5# 2x10 L 5s hold LAQ 2x10 bilat 5# Side stepping with RTB at ankles x2 laps Step ups 6" step1x10ea Stairs 1/2 flight- reciprocal up, step to down  DATE: 07/26/22 Therapeutic Exercise: Nu-step L3 x 5 min  PROM L knee with overpressure Manual stretching of bilat HS in supine 4x20-30 sec  Quad set following MT- 5" x20 Supine SLR (required clinician assist initially) 2x10 LAQ 2x10 bilat 4# STS from low plinth 2x10 (cues for decreasing valgus and hip extension upon standing) Sidelying clams GTB 3x10ea Step ups 4" step 2x10 L  Treatment:                                                DATE: 07/23/22 Therapeutic Exercise: Nu-step L3 x 5 min  Manual stretching of bilat HS in supine 2x20-30 sec  STM to L adductors Manual adductor stretching LAQ 2x10 bilat 4# STS from low  plinth 2x10 (cues for decreasing valgus and hip extension upon standing) Sidelying clams GTB 2x10ea Supine glute set with clam shell x30 with GTB HEP handout and review   Treatment:                                                DATE: 07/19/22 Therapeutic Exercise: Nu-step L3 x 5 min  Manual stretching of bilat HS in supine 2x20-30 sec  STM to adductors Manual adductor stretching LAQ 2x10 bilat 2# STS from low plinth 2x10 (cues for decreasing valgus and hip extension upon standing) R sidelying L clam shell 3x10 Supine glute set with clam shell x30 with GTB standing heel raises 2 x 10 reps with UE support on airex Standing on airex with NBOS 3x30sec with SBA Standing marching on airex x20 CGA   PATIENT EDUCATION:  Education details: ROM findings, dx, prognosis, POC, exercise form, rationale of interventions, and relevant anatomy.  Person educated: Patient and   Education method: Explanation, demonstration, verbal and tactile cues Education comprehension: verbalized understanding, returned demonstration, verbal and tactile cues required  HOME EXERCISE PROGRAM: Access Code: [email or web address] URL: [email or web address] Date: 07/23/2022 Prepared by: Riki Altes  Exercises - Clam with Resistance  - 1-2 x daily - 7 x weekly - 2-3 sets - 10 reps - Sit to Stand Without Arm Support  - 1-2 x daily - 7 x weekly - 2-3 sets - 10 reps - Narrow Stance with Counter Support  - 1-2 x daily - 7 x weekly - 3 sets - 20-30seconds hold - Heel Toe Raises  with Counter Support  - 1-2 x daily - 7 x weekly - 3 sets - 10 reps - Standing March with Counter Support  - 1-2 x daily - 7 x weekly - 3 sets - 10 reps  ASSESSMENT:  CLINICAL IMPRESSION: Pt has attended 10 visits of PT thus far and has made steady progress towards goals. She has met all STG and 1/4 LTG. Significant improvement in 5xSTS test. She reports improved subjective progress with ADLs, but does still feel limited with walking and stair  climbing. MMT shows minimal change in strength since IE. She has demonstrated progress through progressions of therex and therAct interventions. Pt was able to perform step ups on 6" step today and climb 1/2 flight of stairs reciprocally. Pt will benefit from continued PT for strengthening and fall prevention.    OBJECTIVE IMPAIRMENTS: decreased activity tolerance, decreased endurance, decreased mobility, difficulty walking, decreased strength, and pain.   ACTIVITY LIMITATIONS: squatting, transfers, and locomotion level  PARTICIPATION LIMITATIONS: cleaning, community activity, and gardening  PERSONAL FACTORS: 3+ comorbidities: Ascending aortic aneurysm 5.7 cm, MCI, R TKR, cervical fusion, L knee meniscus tear, arthritis  are also affecting patient's functional outcome.   REHAB POTENTIAL: Good  CLINICAL DECISION MAKING: Stable/uncomplicated  EVALUATION COMPLEXITY: Low   GOALS:  SHORT TERM GOALS: Target date: 07/18/2022  Pt will be independent and compliant with HEP for improved strength, pain, mobility, and function.  Baseline: Goal status: MET 6/10  2.  Pt will tolerate exercises without adverse effects for improved L LE strength and tolerance with daily mobility.  Baseline:  Goal status: MET 6/10  3.  Pt will report at least a 40% improvement in daily mobility.  Baseline:  Goal status: MET 6/10    LONG TERM GOALS: Target date: 08/08/2022   Pt will demo improved L LE strength in hip flexion to 4/5 and L knee extension to 4+/5 to = R LE and at least 4-5# increase in hip abd to improve performance of and tolerance with functional mobility including extending ambulation distance.  Baseline:  Goal status: IN PROGRESS 6/17  2.  Pt will report increased ambulation distance at [facility] without increased pain safely in order to attend more events.  Baseline:  Goal status: MET 6/10  3.  Pt will be able to perform car transfers independently with no > than minimal difficulty.   Baseline:  Goal status: IN PROGRESS (still having some difficulty 6/10)  4.  Pt will report she is able to perform her normal functional mobility skills safely without significant difficulty and pain.  Baseline:  Goal status: IN PROGRESS     PLAN:  PT FREQUENCY: 1-2x/week  PT DURATION: 6 weeks  PLANNED INTERVENTIONS: Therapeutic exercises, Therapeutic activity, Neuromuscular re-education, Balance training, Gait training, Patient/Family education, Self Care, Stair training, Aquatic Therapy, Dry Needling, Electrical stimulation, Cryotherapy, Moist heat, Taping, Manual therapy, and Re-evaluation  PLAN FOR NEXT SESSION: review current HEP (pt did not bring to this visit).  Progress LE strengthening per protocol.  Balance training.  Monitor L LE stability--Pt states her L knee wants to give out daily.   Riki Altes, PTA  07/30/22 11:20 AM

## 2022-08-02 ENCOUNTER — Encounter (HOSPITAL_BASED_OUTPATIENT_CLINIC_OR_DEPARTMENT_OTHER): Payer: Self-pay

## 2022-08-02 ENCOUNTER — Ambulatory Visit (HOSPITAL_BASED_OUTPATIENT_CLINIC_OR_DEPARTMENT_OTHER): Payer: Medicare Other

## 2022-08-02 DIAGNOSIS — M5459 Other low back pain: Secondary | ICD-10-CM

## 2022-08-02 DIAGNOSIS — M6281 Muscle weakness (generalized): Secondary | ICD-10-CM

## 2022-08-02 DIAGNOSIS — M25552 Pain in left hip: Secondary | ICD-10-CM | POA: Diagnosis not present

## 2022-08-02 NOTE — Therapy (Signed)
OUTPATIENT PHYSICAL THERAPY LOWER EXTREMITY TREATMENT   Patient Name: Janet Wilson MRN: [id number] DOB:12/25/1934, 88 y.o., female Today's Date: 08/02/2022  END OF SESSION:  PT End of Session - 08/02/22 1117     Visit Number 11    Number of Visits 8    Date for PT Re-Evaluation 08/08/22    Authorization Type [employer] MCR    PT Start Time 1016    PT Stop Time 1100    PT Time Calculation (min) 44 min    Activity Tolerance Patient tolerated treatment well    Behavior During Therapy WFL for tasks assessed/performed                    Past Medical History:  Diagnosis Date   Aortic aneurysm (HCC)    Arthritis    OA BOTH KNEES AND HANDS   Atrial tachycardia    HX OF   GERD (gastroesophageal reflux disease)    Glaucoma    left eye   Goiter    CAUSING COUGH, HOARSINESS AND DRY THROAT   Headache(784.0)    MIGRAINES    Hyperlipidemia    Neuralgia    Overactive bladder    RLS (restless legs syndrome)    Temporal arteritis (HCC)    Past Surgical History:  Procedure Laterality Date   ARTERY BIOPSY Right 07/21/2012   Procedure: BIOPSY TEMPORAL ARTERY;  Surgeon: Currie Paris, MD;  Location: [facility];  Service: General;  Laterality: Right;   CERVICAL FUSION  2012   Dr. Venetia Maxon   EYE SURGERY     CATARACT EXTRACTION- BILATERAL   INCONTINENCE SURGERY     JOINT REPLACEMENT     right knee   LEFT SHOULDER SURGERY     RIGHT KNEE ARTHROSCOPY  11/2009     ROBOTIC ASSISTED BILATERAL SALPINGO OOPHERECTOMY Bilateral 12/08/2013   Procedure: ROBOTIC ASSISTED LAPAROSCOPIC BILATERAL SALPINGO OOPHORECTOMY WITH STAGING;  Surgeon: Adolphus Birchwood, MD;  Location: [facility];  Service: Gynecology;  Laterality: Bilateral;   TOTAL HIP ARTHROPLASTY Left 04/03/2022   Procedure: TOTAL HIP ARTHROPLASTY ANTERIOR APPROACH;  Surgeon: Sheral Apley, MD;  Location: [facility];  Service: Orthopedics;  Laterality: Left;   TOTAL KNEE ARTHROPLASTY  07/04/2011   Procedure: TOTAL KNEE ARTHROPLASTY;   Surgeon: Loanne Drilling, MD;  Location: [facility];  Service: Orthopedics;  Laterality: Right;   Patient Active Problem List   Diagnosis Date Noted   Dysphagia 07/24/2022   Flatulence, eructation and gas pain 07/24/2022   Hip pain 07/24/2022   Left lower quadrant pain 07/24/2022   Aortic valve disease 04/04/2022   Closed left hip fracture (HCC) 04/02/2022   Aortic aneurysm (HCC) 04/02/2022   Dementia (HCC) 01/21/2022   Post-nasal drip 12/20/2021   Gastroesophageal reflux disease without esophagitis 12/20/2021   Depression with anxiety 12/20/2021   Cellulitis of right leg 12/20/2021   Primary insomnia 12/20/2021   Hyperlipidemia 12/20/2021   Conjunctivitis, chronic, follicular 03/06/2021   Osteoarthritis of right glenohumeral joint 04/04/2020   Coronary artery calcification 12/18/2019   Aortic atherosclerosis (HCC) 12/18/2019   Pure hypercholesterolemia 12/18/2019   Tear of lateral meniscus of knee 01/21/2019   Senile osteopenia 03/20/2018   H/O temporal arteritis 03/20/2018   Memory loss, short term 03/20/2018   Weight loss 03/20/2018   Nausea 03/20/2018   Mixed urge and stress incontinence 03/20/2018   Multinodular goiter (nontoxic) 03/20/2018   RLS (restless legs syndrome) 12/19/2017   Pain in right knee 10/09/2017   Ectopic atrial tachycardia 10/08/2016  Cough 09/06/2015   Hoarseness of voice 09/06/2015   Rhinitis, chronic 09/06/2015   Bilateral occipital neuralgia 06/16/2015   Convergence insufficiency 08/09/2014   Neck pain 03/05/2013   Capsular glaucoma with pseudoexfoliation of lens, left eye, moderate stage 12/22/2012   Right temporal headache 06/30/2012   Family history of coronary artery disease 09/24/2011   S/P TKR (total knee replacement) 09/24/2011   Tachycardia 08/10/2011   OA (osteoarthritis) of knee 07/04/2011     REFERRING PROVIDER: Sheral Apley, MD  REFERRING DIAG: left total hip arthroplasty for fracture 04/03/2022; lumbago  THERAPY DIAG:   Muscle weakness (generalized)  Pain in left hip  Other low back pain  Rationale for Evaluation and Treatment: Rehabilitation  ONSET DATE: DOS 04/03/2022  SUBJECTIVE:   SUBJECTIVE STATEMENT: Pt report mild pain in left knee and groin. Less than usual.          PERTINENT HISTORY: -L THA with anterior approach on 04/03/2022  -Ascending aortic aneurysm 5.7 cm.  Follows with Dr. Lavinia Sharps.  Pt has a 10# lifting restriction   -L4-5 anterolisthesis, L knee pain with meniscus tear, MCI (mild cognitive impairment), R TKR in 2013  -Arthritis, CAD, cervical fusion    PAIN:  Are you having pain?  Location:  L hip NPRS:  1/10 current and best, 2/10 worst  Location:  lumbar NPRS:  0/10 current and best, 2-3/10 worst  Location:  L lateral knee NPRS:  6/10  PRECAUTIONS: Anterior hip and Other: Ascending aortic aneurysm 5.7 cm with 10# lifting restriction, L4-5 anterolisthesis, R TKR, cervical fusion, L knee meniscus tear and L knee wanting to give way  WEIGHT BEARING RESTRICTIONS:  none indicated  FALLS:  Has patient fallen in last 6 months? Yes. Number of falls 2  LIVING ENVIRONMENT: Lives with: lives with their spouse Lives in: 1 level home.  Independent living at [employer]: no Has following equipment at home: Single point cane and Walker - 2 wheeled   PLOF: Independent  Pt ambulated 3 laps at [employer] and did not use an AD.  Pt was able to garden.  PATIENT GOALS: able to do her gardening, able to attend events at [facility]  NEXT MD VISIT: 07/25/2022  OBJECTIVE:   DIAGNOSTIC FINDINGS: X rays in 2015:  Grade 1 anterolisthesis L4-5, mild multilevel deg changes of the lumbar spine  PATIENT SURVEYS:  FOTO 43 with a goal of 61 at visit #14  COGNITION: Overall cognitive status: Pt was able to answer questions and followed commands well.  Pt has MCI which pt's daughter confirms.      PALPATION: No tenderness in palpation of central lumbar and lumbar  flanks  LOWER EXTREMITY MMT:  MMT Right eval Left eval Right 6/17 Left 6/17  Hip flexion 4/5 4-/5 4-/5 4-/5  Hip extension      Hip abduction 17.8 11.4 4/5 4-/5  Hip adduction      Hip internal rotation      Hip external rotation      Knee flexion 4/5 seated 4/5 seated 4+/5 4+/5  Knee extension 4+/5 4-/5 4/5 4/5  Ankle dorsiflexion      Ankle plantarflexion      Ankle inversion      Ankle eversion       (Blank rows = not tested)   FUNCTIONAL TESTS:  5x STS test: 21.24 3 reps without UE support/2 reps with UE support  pt able to perform without UE support.   6/17: 5xSTS 15.13 seconds without UE support  GAIT: Assistive device utilized: None Comments: Pt ambulates with a good heel toe gait with reciprocal arm swing.  Pt had no limp.  Pt has decreased pelvic rotation bilat   TODAY'S TREATMENT:       Treatment:                                                  DATE: 08/02/22  Therapeutic Exercise: Nu-step L3 x 5 min  PROM L knee with overpressure STM to L hip flexor Sidelying clams 3x10 ea GTB SAQ 5# 2x10 L 5s hold LAQ 2x10 bilat 5#- 5s hold Heel/toe raises 2x15  Step ups 6" step1x10ea Stairs 1/2 flight- reciprocal up, step to down   DATE: 07/30/22  5xSTS Updated strength   Therapeutic Exercise: Nu-step L3 x 5 min  PROM L knee with overpressure STM to L hip flexor SAQ 2# x20, 5# 2x10 L 5s hold LAQ 2x10 bilat 5# Step ups 6" step 2x10ea Stairs 1/2 flight x2 reciprocal     PATIENT EDUCATION:  Education details: ROM findings, dx, prognosis, POC, exercise form, rationale of interventions, and relevant anatomy.  Person educated: Patient and   Education method: Explanation, demonstration, verbal and tactile cues Education comprehension: verbalized understanding, returned demonstration, verbal and tactile cues required  HOME EXERCISE PROGRAM: Access Code: [email or web address] URL: [email or web address] Date: 07/23/2022 Prepared by: Riki Altes  Exercises - Clam with Resistance  - 1-2 x daily - 7 x weekly - 2-3 sets - 10 reps - Sit to Stand Without Arm Support  - 1-2 x daily - 7 x weekly - 2-3 sets - 10 reps - Narrow Stance with Counter Support  - 1-2 x daily - 7 x weekly - 3 sets - 20-30seconds hold - Heel Toe Raises with Counter Support  - 1-2 x daily - 7 x weekly - 3 sets - 10 reps - Standing March with Counter Support  - 1-2 x daily - 7 x weekly - 3 sets - 10 reps  ASSESSMENT:  CLINICAL IMPRESSION: Pt reported improvement in pain level after warm up on nu-step. Continued to work on strengthening and functional activities today with good tolerance. Weakness observed with step ups leading with L LE.  She mentioned experiencing unexplained weight loss for the last year, which her doctors are aware of and have been running tests. So far no explanation for this per pt.    OBJECTIVE IMPAIRMENTS: decreased activity tolerance, decreased endurance, decreased mobility, difficulty walking, decreased strength, and pain.   ACTIVITY LIMITATIONS: squatting, transfers, and locomotion level  PARTICIPATION LIMITATIONS: cleaning, community activity, and gardening  PERSONAL FACTORS: 3+ comorbidities: Ascending aortic aneurysm 5.7 cm, MCI, R TKR, cervical fusion, L knee meniscus tear, arthritis  are also affecting patient's functional outcome.   REHAB POTENTIAL: Good  CLINICAL DECISION MAKING: Stable/uncomplicated  EVALUATION COMPLEXITY: Low   GOALS:  SHORT TERM GOALS: Target date: 07/18/2022  Pt will be independent and compliant with HEP for improved strength, pain, mobility, and function.  Baseline: Goal status: MET 6/10  2.  Pt will tolerate exercises without adverse effects for improved L LE strength and tolerance with daily mobility.  Baseline:  Goal status: MET 6/10  3.  Pt will report at least a 40% improvement in daily mobility.  Baseline:  Goal status: MET 6/10    LONG TERM GOALS: Target date: 08/08/2022   Pt  will demo improved L LE strength in hip flexion to 4/5 and L knee extension to 4+/5 to = R LE and at least 4-5# increase in hip abd to improve performance of and tolerance with functional mobility including extending ambulation distance.  Baseline:  Goal status: IN PROGRESS 6/17  2.  Pt will report increased ambulation distance at [facility] without increased pain safely in order to attend more events.  Baseline:  Goal status: MET 6/10  3.  Pt will be able to perform car transfers independently with no > than minimal difficulty.  Baseline:  Goal status: IN PROGRESS (still having some difficulty 6/10)  4.  Pt will report she is able to perform her normal functional mobility skills safely without significant difficulty and pain.  Baseline:  Goal status: IN PROGRESS     PLAN:  PT FREQUENCY: 1-2x/week  PT DURATION: 6 weeks  PLANNED INTERVENTIONS: Therapeutic exercises, Therapeutic activity, Neuromuscular re-education, Balance training, Gait training, Patient/Family education, Self Care, Stair training, Aquatic Therapy, Dry Needling, Electrical stimulation, Cryotherapy, Moist heat, Taping, Manual therapy, and Re-evaluation  PLAN FOR NEXT SESSION: review current HEP (pt did not bring to this visit).  Progress LE strengthening per protocol.  Balance training.  Monitor L LE stability--Pt states her L knee wants to give out daily.   Riki Altes, PTA  08/02/22 1:00 PM

## 2022-08-05 NOTE — Therapy (Signed)
OUTPATIENT PHYSICAL THERAPY LOWER EXTREMITY TREATMENT   Patient Name: Janet Wilson MRN: [id number] DOB:1934/08/03, 88 y.o., female Today's Date: 08/05/2022  END OF SESSION:           Past Medical History:  Diagnosis Date   Aortic aneurysm (HCC)    Arthritis    OA BOTH KNEES AND HANDS   Atrial tachycardia    HX OF   GERD (gastroesophageal reflux disease)    Glaucoma    left eye   Goiter    CAUSING COUGH, HOARSINESS AND DRY THROAT   Headache(784.0)    MIGRAINES    Hyperlipidemia    Neuralgia    Overactive bladder    RLS (restless legs syndrome)    Temporal arteritis (HCC)    Past Surgical History:  Procedure Laterality Date   ARTERY BIOPSY Right 07/21/2012   Procedure: BIOPSY TEMPORAL ARTERY;  Surgeon: Currie Paris, MD;  Location: [facility];  Service: General;  Laterality: Right;   CERVICAL FUSION  2012   Dr. Venetia Maxon   EYE SURGERY     CATARACT EXTRACTION- BILATERAL   INCONTINENCE SURGERY     JOINT REPLACEMENT     right knee   LEFT SHOULDER SURGERY     RIGHT KNEE ARTHROSCOPY  11/2009     ROBOTIC ASSISTED BILATERAL SALPINGO OOPHERECTOMY Bilateral 12/08/2013   Procedure: ROBOTIC ASSISTED LAPAROSCOPIC BILATERAL SALPINGO OOPHORECTOMY WITH STAGING;  Surgeon: Adolphus Birchwood, MD;  Location: [facility];  Service: Gynecology;  Laterality: Bilateral;   TOTAL HIP ARTHROPLASTY Left 04/03/2022   Procedure: TOTAL HIP ARTHROPLASTY ANTERIOR APPROACH;  Surgeon: Sheral Apley, MD;  Location: [facility];  Service: Orthopedics;  Laterality: Left;   TOTAL KNEE ARTHROPLASTY  07/04/2011   Procedure: TOTAL KNEE ARTHROPLASTY;  Surgeon: Loanne Drilling, MD;  Location: [facility];  Service: Orthopedics;  Laterality: Right;   Patient Active Problem List   Diagnosis Date Noted   Dysphagia 07/24/2022   Flatulence, eructation and gas pain 07/24/2022   Hip pain 07/24/2022   Left lower quadrant pain 07/24/2022   Aortic valve disease 04/04/2022   Closed left hip fracture (HCC) 04/02/2022    Aortic aneurysm (HCC) 04/02/2022   Dementia (HCC) 01/21/2022   Post-nasal drip 12/20/2021   Gastroesophageal reflux disease without esophagitis 12/20/2021   Depression with anxiety 12/20/2021   Cellulitis of right leg 12/20/2021   Primary insomnia 12/20/2021   Hyperlipidemia 12/20/2021   Conjunctivitis, chronic, follicular 03/06/2021   Osteoarthritis of right glenohumeral joint 04/04/2020   Coronary artery calcification 12/18/2019   Aortic atherosclerosis (HCC) 12/18/2019   Pure hypercholesterolemia 12/18/2019   Tear of lateral meniscus of knee 01/21/2019   Senile osteopenia 03/20/2018   H/O temporal arteritis 03/20/2018   Memory loss, short term 03/20/2018   Weight loss 03/20/2018   Nausea 03/20/2018   Mixed urge and stress incontinence 03/20/2018   Multinodular goiter (nontoxic) 03/20/2018   RLS (restless legs syndrome) 12/19/2017   Pain in right knee 10/09/2017   Ectopic atrial tachycardia 10/08/2016   Cough 09/06/2015   Hoarseness of voice 09/06/2015   Rhinitis, chronic 09/06/2015   Bilateral occipital neuralgia 06/16/2015   Convergence insufficiency 08/09/2014   Neck pain 03/05/2013   Capsular glaucoma with pseudoexfoliation of lens, left eye, moderate stage 12/22/2012   Right temporal headache 06/30/2012   Family history of coronary artery disease 09/24/2011   S/P TKR (total knee replacement) 09/24/2011   Tachycardia 08/10/2011   OA (osteoarthritis) of knee 07/04/2011     REFERRING PROVIDER: Sheral Apley, MD  REFERRING DIAG: left total hip arthroplasty for fracture 04/03/2022; lumbago  THERAPY DIAG:  No diagnosis found.  Rationale for Evaluation and Treatment: Rehabilitation  ONSET DATE: DOS 04/03/2022  SUBJECTIVE:   SUBJECTIVE STATEMENT: Pt report mild pain in left knee and groin. Less than usual.   POC:  Recert vs D/C.  Check goals.         PERTINENT HISTORY: -L THA with anterior approach on 04/03/2022  -Ascending aortic aneurysm 5.7 cm.   Follows with Dr. Lavinia Sharps.  Pt has a 10# lifting restriction   -L4-5 anterolisthesis, L knee pain with meniscus tear, MCI (mild cognitive impairment), R TKR in 2013  -Arthritis, CAD, cervical fusion    PAIN:  Are you having pain?  Location:  L hip NPRS:  1/10 current and best, 2/10 worst  Location:  lumbar NPRS:  0/10 current and best, 2-3/10 worst  Location:  L lateral knee NPRS:  6/10  PRECAUTIONS: Anterior hip and Other: Ascending aortic aneurysm 5.7 cm with 10# lifting restriction, L4-5 anterolisthesis, R TKR, cervical fusion, L knee meniscus tear and L knee wanting to give way  WEIGHT BEARING RESTRICTIONS:  none indicated  FALLS:  Has patient fallen in last 6 months? Yes. Number of falls 2  LIVING ENVIRONMENT: Lives with: lives with their spouse Lives in: 1 level home.  Independent living at [employer]: no Has following equipment at home: Single point cane and Walker - 2 wheeled   PLOF: Independent  Pt ambulated 3 laps at [employer] and did not use an AD.  Pt was able to garden.  PATIENT GOALS: able to do her gardening, able to attend events at [facility]  NEXT MD VISIT: 07/25/2022  OBJECTIVE:   DIAGNOSTIC FINDINGS: X rays in 2015:  Grade 1 anterolisthesis L4-5, mild multilevel deg changes of the lumbar spine  PATIENT SURVEYS:  FOTO 43 with a goal of 61 at visit #14  COGNITION: Overall cognitive status: Pt was able to answer questions and followed commands well.  Pt has MCI which pt's daughter confirms.      PALPATION: No tenderness in palpation of central lumbar and lumbar flanks  LOWER EXTREMITY MMT:  MMT Right eval Left eval Right 6/17 Left 6/17  Hip flexion 4/5 4-/5 4-/5 4-/5  Hip extension      Hip abduction 17.8 11.4 4/5 4-/5  Hip adduction      Hip internal rotation      Hip external rotation      Knee flexion 4/5 seated 4/5 seated 4+/5 4+/5  Knee extension 4+/5 4-/5 4/5 4/5  Ankle dorsiflexion      Ankle plantarflexion      Ankle  inversion      Ankle eversion       (Blank rows = not tested)   FUNCTIONAL TESTS:  5x STS test: 21.24 3 reps without UE support/2 reps with UE support  pt able to perform without UE support.   6/17: 5xSTS 15.13 seconds without UE support   GAIT: Assistive device utilized: None Comments: Pt ambulates with a good heel toe gait with reciprocal arm swing.  Pt had no limp.  Pt has decreased pelvic rotation bilat   TODAY'S TREATMENT:       Treatment:  DATE: 08/02/22  Therapeutic Exercise: Nu-step L3 x 5 min  PROM L knee with overpressure STM to L hip flexor Sidelying clams 3x10 ea GTB SAQ 5# 2x10 L 5s hold LAQ 2x10 bilat 5#- 5s hold Heel/toe raises 2x15  Step ups 6" step1x10ea Stairs 1/2 flight- reciprocal up, step to down   DATE: 07/30/22  5xSTS Updated strength   Therapeutic Exercise: Nu-step L3 x 5 min  PROM L knee with overpressure STM to L hip flexor SAQ 2# x20, 5# 2x10 L 5s hold LAQ 2x10 bilat 5# Step ups 6" step 2x10ea Stairs 1/2 flight x2 reciprocal     PATIENT EDUCATION:  Education details: ROM findings, dx, prognosis, POC, exercise form, rationale of interventions, and relevant anatomy.  Person educated: Patient and   Education method: Explanation, demonstration, verbal and tactile cues Education comprehension: verbalized understanding, returned demonstration, verbal and tactile cues required  HOME EXERCISE PROGRAM: Access Code: [email or web address] URL: [email or web address] Date: 07/23/2022 Prepared by: Riki Altes  Exercises - Clam with Resistance  - 1-2 x daily - 7 x weekly - 2-3 sets - 10 reps - Sit to Stand Without Arm Support  - 1-2 x daily - 7 x weekly - 2-3 sets - 10 reps - Narrow Stance with Counter Support  - 1-2 x daily - 7 x weekly - 3 sets - 20-30seconds hold - Heel Toe Raises with Counter Support  - 1-2 x daily - 7 x weekly - 3 sets - 10 reps - Standing March with Counter Support   - 1-2 x daily - 7 x weekly - 3 sets - 10 reps  ASSESSMENT:  CLINICAL IMPRESSION: Pt reported improvement in pain level after warm up on nu-step. Continued to work on strengthening and functional activities today with good tolerance. Weakness observed with step ups leading with L LE.  She mentioned experiencing unexplained weight loss for the last year, which her doctors are aware of and have been running tests. So far no explanation for this per pt.    OBJECTIVE IMPAIRMENTS: decreased activity tolerance, decreased endurance, decreased mobility, difficulty walking, decreased strength, and pain.   ACTIVITY LIMITATIONS: squatting, transfers, and locomotion level  PARTICIPATION LIMITATIONS: cleaning, community activity, and gardening  PERSONAL FACTORS: 3+ comorbidities: Ascending aortic aneurysm 5.7 cm, MCI, R TKR, cervical fusion, L knee meniscus tear, arthritis  are also affecting patient's functional outcome.   REHAB POTENTIAL: Good  CLINICAL DECISION MAKING: Stable/uncomplicated  EVALUATION COMPLEXITY: Low   GOALS:  SHORT TERM GOALS: Target date: 07/18/2022  Pt will be independent and compliant with HEP for improved strength, pain, mobility, and function.  Baseline: Goal status: MET 6/10  2.  Pt will tolerate exercises without adverse effects for improved L LE strength and tolerance with daily mobility.  Baseline:  Goal status: MET 6/10  3.  Pt will report at least a 40% improvement in daily mobility.  Baseline:  Goal status: MET 6/10    LONG TERM GOALS: Target date: 08/08/2022   Pt will demo improved L LE strength in hip flexion to 4/5 and L knee extension to 4+/5 to = R LE and at least 4-5# increase in hip abd to improve performance of and tolerance with functional mobility including extending ambulation distance.  Baseline:  Goal status: IN PROGRESS 6/17  2.  Pt will report increased ambulation distance at [facility] without increased pain safely in order to attend  more events.  Baseline:  Goal status: MET 6/10  3.  Pt will be able to perform car transfers  independently with no > than minimal difficulty.  Baseline:  Goal status: IN PROGRESS (still having some difficulty 6/10)  4.  Pt will report she is able to perform her normal functional mobility skills safely without significant difficulty and pain.  Baseline:  Goal status: IN PROGRESS     PLAN:  PT FREQUENCY: 1-2x/week  PT DURATION: 6 weeks  PLANNED INTERVENTIONS: Therapeutic exercises, Therapeutic activity, Neuromuscular re-education, Balance training, Gait training, Patient/Family education, Self Care, Stair training, Aquatic Therapy, Dry Needling, Electrical stimulation, Cryotherapy, Moist heat, Taping, Manual therapy, and Re-evaluation  PLAN FOR NEXT SESSION: review current HEP (pt did not bring to this visit).  Progress LE strengthening per protocol.  Balance training.  Monitor L LE stability--Pt states her L knee wants to give out daily.   Riki Altes, PTA  08/05/22 8:26 AM

## 2022-08-06 ENCOUNTER — Ambulatory Visit (HOSPITAL_BASED_OUTPATIENT_CLINIC_OR_DEPARTMENT_OTHER): Payer: Medicare Other | Admitting: Physical Therapy

## 2022-08-06 ENCOUNTER — Encounter (HOSPITAL_BASED_OUTPATIENT_CLINIC_OR_DEPARTMENT_OTHER): Payer: Self-pay | Admitting: Physical Therapy

## 2022-08-06 DIAGNOSIS — M5459 Other low back pain: Secondary | ICD-10-CM | POA: Diagnosis not present

## 2022-08-06 DIAGNOSIS — M6281 Muscle weakness (generalized): Secondary | ICD-10-CM

## 2022-08-06 DIAGNOSIS — M25552 Pain in left hip: Secondary | ICD-10-CM | POA: Diagnosis not present

## 2022-08-09 ENCOUNTER — Encounter (HOSPITAL_BASED_OUTPATIENT_CLINIC_OR_DEPARTMENT_OTHER): Payer: Self-pay

## 2022-08-09 ENCOUNTER — Ambulatory Visit (HOSPITAL_BASED_OUTPATIENT_CLINIC_OR_DEPARTMENT_OTHER): Payer: Medicare Other

## 2022-08-09 DIAGNOSIS — M25552 Pain in left hip: Secondary | ICD-10-CM

## 2022-08-09 DIAGNOSIS — M6281 Muscle weakness (generalized): Secondary | ICD-10-CM | POA: Diagnosis not present

## 2022-08-09 DIAGNOSIS — M5459 Other low back pain: Secondary | ICD-10-CM | POA: Diagnosis not present

## 2022-08-09 NOTE — Therapy (Addendum)
OUTPATIENT PHYSICAL THERAPY LOWER EXTREMITY DISCHARGE   Patient Name: Janet Wilson MRN: [id number] DOB:1934-07-13, 88 y.o., female Today's Date: 08/09/2022  END OF SESSION:  PT End of Session - 08/09/22 1039     Visit Number 13    Number of Visits 13    Date for PT Re-Evaluation 08/13/22    Authorization Type [employer] MCR    PT Start Time 1022    PT Stop Time 1100    PT Time Calculation (min) 38 min    Activity Tolerance Patient tolerated treatment well    Behavior During Therapy WFL for tasks assessed/performed                      Past Medical History:  Diagnosis Date   Aortic aneurysm (HCC)    Arthritis    OA BOTH KNEES AND HANDS   Atrial tachycardia    HX OF   GERD (gastroesophageal reflux disease)    Glaucoma    left eye   Goiter    CAUSING COUGH, HOARSINESS AND DRY THROAT   Headache(784.0)    MIGRAINES    Hyperlipidemia    Neuralgia    Overactive bladder    RLS (restless legs syndrome)    Temporal arteritis (HCC)    Past Surgical History:  Procedure Laterality Date   ARTERY BIOPSY Right 07/21/2012   Procedure: BIOPSY TEMPORAL ARTERY;  Surgeon: Currie Paris, MD;  Location: [facility];  Service: General;  Laterality: Right;   CERVICAL FUSION  2012   Dr. Venetia Maxon   EYE SURGERY     CATARACT EXTRACTION- BILATERAL   INCONTINENCE SURGERY     JOINT REPLACEMENT     right knee   LEFT SHOULDER SURGERY     RIGHT KNEE ARTHROSCOPY  11/2009     ROBOTIC ASSISTED BILATERAL SALPINGO OOPHERECTOMY Bilateral 12/08/2013   Procedure: ROBOTIC ASSISTED LAPAROSCOPIC BILATERAL SALPINGO OOPHORECTOMY WITH STAGING;  Surgeon: Adolphus Birchwood, MD;  Location: [facility];  Service: Gynecology;  Laterality: Bilateral;   TOTAL HIP ARTHROPLASTY Left 04/03/2022   Procedure: TOTAL HIP ARTHROPLASTY ANTERIOR APPROACH;  Surgeon: Sheral Apley, MD;  Location: [facility];  Service: Orthopedics;  Laterality: Left;   TOTAL KNEE ARTHROPLASTY  07/04/2011   Procedure: TOTAL KNEE  ARTHROPLASTY;  Surgeon: Loanne Drilling, MD;  Location: [facility];  Service: Orthopedics;  Laterality: Right;   Patient Active Problem List   Diagnosis Date Noted   Dysphagia 07/24/2022   Flatulence, eructation and gas pain 07/24/2022   Hip pain 07/24/2022   Left lower quadrant pain 07/24/2022   Aortic valve disease 04/04/2022   Closed left hip fracture (HCC) 04/02/2022   Aortic aneurysm (HCC) 04/02/2022   Dementia (HCC) 01/21/2022   Post-nasal drip 12/20/2021   Gastroesophageal reflux disease without esophagitis 12/20/2021   Depression with anxiety 12/20/2021   Cellulitis of right leg 12/20/2021   Primary insomnia 12/20/2021   Hyperlipidemia 12/20/2021   Conjunctivitis, chronic, follicular 03/06/2021   Osteoarthritis of right glenohumeral joint 04/04/2020   Coronary artery calcification 12/18/2019   Aortic atherosclerosis (HCC) 12/18/2019   Pure hypercholesterolemia 12/18/2019   Tear of lateral meniscus of knee 01/21/2019   Senile osteopenia 03/20/2018   H/O temporal arteritis 03/20/2018   Memory loss, short term 03/20/2018   Weight loss 03/20/2018   Nausea 03/20/2018   Mixed urge and stress incontinence 03/20/2018   Multinodular goiter (nontoxic) 03/20/2018   RLS (restless legs syndrome) 12/19/2017   Pain in right knee 10/09/2017   Ectopic atrial tachycardia  10/08/2016   Cough 09/06/2015   Hoarseness of voice 09/06/2015   Rhinitis, chronic 09/06/2015   Bilateral occipital neuralgia 06/16/2015   Convergence insufficiency 08/09/2014   Neck pain 03/05/2013   Capsular glaucoma with pseudoexfoliation of lens, left eye, moderate stage 12/22/2012   Right temporal headache 06/30/2012   Family history of coronary artery disease 09/24/2011   S/P TKR (total knee replacement) 09/24/2011   Tachycardia 08/10/2011   OA (osteoarthritis) of knee 07/04/2011     REFERRING PROVIDER: Sheral Apley, MD  REFERRING DIAG: left total hip arthroplasty for fracture 04/03/2022;  lumbago  THERAPY DIAG:  Muscle weakness (generalized)  Pain in left hip  Other low back pain  Rationale for Evaluation and Treatment: Rehabilitation  ONSET DATE: DOS 04/03/2022  SUBJECTIVE:   SUBJECTIVE STATEMENT: Pt reports some increase in knee and hip pain after climbing stairs at [employer].           PERTINENT HISTORY: -L THA with anterior approach on 04/03/2022  -Ascending aortic aneurysm 5.7 cm.  Follows with Dr. Lavinia Sharps.  Pt has a 10# lifting restriction   -L4-5 anterolisthesis, L knee pain with meniscus tear, MCI (mild cognitive impairment), R TKR in 2013  -Arthritis, CAD, cervical fusion    PAIN:  Are you having pain?  Location:  L hip NPRS:  0/10 current and best, 4-5/10 worst  Location:  lumbar NPRS:  0/10 current and best, 2/10 worst  Location:  L lateral knee NPRS:  6/10 current, 6-7/10 worst  PRECAUTIONS: Anterior hip and Other: Ascending aortic aneurysm 5.7 cm with 10# lifting restriction, L4-5 anterolisthesis, R TKR, cervical fusion, L knee meniscus tear and L knee wanting to give way  WEIGHT BEARING RESTRICTIONS:  none indicated  FALLS:  Has patient fallen in last 6 months? Yes. Number of falls 2  LIVING ENVIRONMENT: Lives with: lives with their spouse Lives in: 1 level home.  Independent living at [employer]: no Has following equipment at home: Single point cane and Walker - 2 wheeled   PLOF: Independent  Pt ambulated 3 laps at [employer] and did not use an AD.  Pt was able to garden.  PATIENT GOALS: able to do her gardening, able to attend events at [facility]  NEXT MD VISIT: 07/25/2022  OBJECTIVE:   DIAGNOSTIC FINDINGS: X rays in 2015:  Grade 1 anterolisthesis L4-5, mild multilevel deg changes of the lumbar spine  TODAY'S TREATMENT:   PATIENT SURVEYS:  DATE: 08/09/22   Therapeutic Exercise: Nu-step L3 x 5 min  PROM L knee with overpressure STM to L hip flexor Sidelying clams 3x10 ea GTB SAQ 5# 2x10 L 5s hold Stairs  1/2 flight- reciprocal up, step to down with [place]   LOWER EXTREMITY MMT:  MMT Right eval Left eval Right 6/17 Left 6/17 Right 6/24 Left 6/24  Hip flexion 4/5 4-/5 4-/5 4-/5 4/5 4/5  Hip extension        Hip abduction 17.8 11.4 4/5 4-/5 16.3 14.8  Hip adduction        Hip internal rotation        Hip external rotation        Knee flexion 4/5 seated 4/5 seated 4+/5 4+/5 4+/5 4+/5  Knee extension 4+/5 4-/5 4/5 4/5 4/5 4/5  Ankle dorsiflexion        Ankle plantarflexion        Ankle inversion        Ankle eversion         (Blank rows = not tested)  GAIT: Comments: Pt ambulated with and without SPC.  She has a good heel toe gait with reciprocal arm swing.  Pt had no limp.  Pt has decreased pelvic rotation bilat  PT reviewed HEP.   Therapeutic Activities: Stair Training:  Pt wanted to perform stairs with a cane today.  PT instructed pt in performing stairs with rail and SPC.  Pt ascended stairs with a reciprocal gait with the rail and SPC and descended stairs with a step to and step through gait.  PT educated pt on proper stepping and cane placement.      PATIENT EDUCATION:  Education details: strength findings, dx, prognosis, POC, exercise form, rationale of interventions, HEP, and relevant anatomy.  Person educated: Patient and   Education method: Explanation, demonstration, verbal cues Education comprehension: verbalized understanding, returned demonstration, verbal cues required  HOME EXERCISE PROGRAM: Access Code: [email or web address] URL: [email or web address] Date: 07/23/2022 Prepared by: Riki Altes  Exercises - Clam with Resistance  - 1-2 x daily - 7 x weekly - 2-3 sets - 10 reps - Sit to Stand Without Arm Support  - 1-2 x daily - 7 x weekly - 2-3 sets - 10 reps - Narrow Stance with Counter Support  - 1-2 x daily - 7 x weekly - 3 sets - 20-30seconds hold - Heel Toe Raises with Counter Support  - 1-2 x daily - 7 x weekly - 3 sets - 10 reps - Standing  March with Counter Support  - 1-2 x daily - 7 x weekly - 3 sets - 10 reps  ASSESSMENT:  CLINICAL IMPRESSION: Pt will be d/c from PT today due to her excellent progress thus far and goal completion. Reviewed HEP with pt with good understanding. Observed stair climbing with SPC and corrected sequencing as needed.    OBJECTIVE IMPAIRMENTS: decreased activity tolerance, decreased endurance, decreased mobility, difficulty walking, decreased strength, and pain.   ACTIVITY LIMITATIONS: squatting, transfers, and locomotion level  PARTICIPATION LIMITATIONS: cleaning, community activity, and gardening  PERSONAL FACTORS: 3+ comorbidities: Ascending aortic aneurysm 5.7 cm, MCI, R TKR, cervical fusion, L knee meniscus tear, arthritis  are also affecting patient's functional outcome.   REHAB POTENTIAL: Good  CLINICAL DECISION MAKING: Stable/uncomplicated  EVALUATION COMPLEXITY: Low   GOALS:  SHORT TERM GOALS: Target date: 07/18/2022  Pt will be independent and compliant with HEP for improved strength, pain, mobility, and function.  Baseline: Goal status: PARTIALLY MET  08/06/22 Target date:  08/13/22  2.  Pt will tolerate exercises without adverse effects for improved L LE strength and tolerance with daily mobility.  Baseline:  Goal status: MET 6/10  3.  Pt will report at least a 40% improvement in daily mobility.  Baseline:  Goal status: MET 6/10    LONG TERM GOALS: Target date: 08/08/2022   Pt will demo improved L LE strength in hip flexion to 4/5 and L knee extension to 4+/5 to = R LE and at least 4-5# increase in hip abd to improve performance of and tolerance with functional mobility including extending ambulation distance.  Baseline:  Goal status: PARTIALLY MET 6/24  2.  Pt will report increased ambulation distance at [facility] without increased pain safely in order to attend more events.  Baseline:  Goal status: MET 6/10  3.  Pt will be able to perform car transfers  independently with no > than minimal difficulty.  Baseline:  Goal status: IMPROVED though NOT MET (08/06/22)  4.  Pt will report she is able to perform her normal functional  mobility skills safely without significant difficulty and pain.  Baseline:  Goal status: 90% MET (08/06/22)      PLAN:  PT FREQUENCY: 1 visit  PT DURATION: 1 week  PLANNED INTERVENTIONS: Therapeutic exercises, Therapeutic activity, Neuromuscular re-education, Balance training, Gait training, Patient/Family education, Self Care, Stair training, Aquatic Therapy, Dry Needling, Electrical stimulation, Cryotherapy, Moist heat, Taping, Manual therapy, and Re-evaluation  PLAN FOR NEXT SESSION:  Pt to be discharged from skilled PT services due to good functional progress.  Pt is agreeable with discharge.   Riki Altes, PTA  08/09/22 12:59 PM  PHYSICAL THERAPY DISCHARGE SUMMARY  Visits from Start of Care: 13  Current functional level related to goals / functional outcomes: See above   Remaining deficits: See above   Education / Equipment: See above  PT reviewed POC/note and is in agreement with discharge.   Audie Clear III PT, DPT 08/10/22 8:28 AM

## 2022-08-13 DIAGNOSIS — L718 Other rosacea: Secondary | ICD-10-CM | POA: Diagnosis not present

## 2022-08-20 DIAGNOSIS — H401422 Capsular glaucoma with pseudoexfoliation of lens, left eye, moderate stage: Secondary | ICD-10-CM | POA: Diagnosis not present

## 2022-08-21 ENCOUNTER — Other Ambulatory Visit: Payer: Self-pay | Admitting: Surgery

## 2022-08-21 DIAGNOSIS — I7121 Aneurysm of the ascending aorta, without rupture: Secondary | ICD-10-CM

## 2022-08-23 ENCOUNTER — Other Ambulatory Visit: Payer: Self-pay | Admitting: Internal Medicine

## 2022-10-01 ENCOUNTER — Encounter: Payer: Self-pay | Admitting: Adult Health

## 2022-10-01 ENCOUNTER — Non-Acute Institutional Stay: Payer: Medicare Other | Admitting: Adult Health

## 2022-10-01 VITALS — BP 132/76 | HR 88 | Temp 98.1°F | Resp 17 | Ht 63.5 in | Wt 105.2 lb

## 2022-10-01 DIAGNOSIS — Z0000 Encounter for general adult medical examination without abnormal findings: Secondary | ICD-10-CM | POA: Diagnosis not present

## 2022-10-01 NOTE — Patient Instructions (Signed)
Janet Wilson , Thank you for taking time to come for your Medicare Wellness Visit. I appreciate your ongoing commitment to your health goals. Please review the following plan we discussed and let me know if I can assist you in the future.   Screening recommendations/referrals: Colonoscopy aged out Mammogram Due Spring of 2025 Bone Density Due Spring of 2026 Recommended yearly ophthalmology/optometry visit for glaucoma screening and checkup Recommended yearly dental visit for hygiene and checkup  Vaccinations: Influenza vaccine- due annually in September/October Pneumococcal vaccine Recommend Prevnar 20 vaccine  Tdap vaccine up to date Shingles vaccine up to date    Advanced directives: reviewed   Conditions/risks identified: fall risk  Next appointment: 1 year   Preventive Care 86 Years and Older, Female Preventive care refers to lifestyle choices and visits with your health care provider that can promote health and wellness. What does preventive care include? A yearly physical exam. This is also called an annual well check. Dental exams once or twice a year. Routine eye exams. Ask your health care provider how often you should have your eyes checked. Personal lifestyle choices, including: Daily care of your teeth and gums. Regular physical activity. Eating a healthy diet. Avoiding tobacco and drug use. Limiting alcohol use. Practicing safe sex. Taking low-dose aspirin every day. Taking vitamin and mineral supplements as recommended by your health care provider. What happens during an annual well check? The services and screenings done by your health care provider during your annual well check will depend on your age, overall health, lifestyle risk factors, and family history of disease. Counseling  Your health care provider may ask you questions about your: Alcohol use. Tobacco use. Drug use. Emotional well-being. Home and relationship well-being. Sexual  activity. Eating habits. History of falls. Memory and ability to understand (cognition). Work and work Astronomer. Reproductive health. Screening  You may have the following tests or measurements: Height, weight, and BMI. Blood pressure. Lipid and cholesterol levels. These may be checked every 5 years, or more frequently if you are over 86 years old. Skin check. Lung cancer screening. You may have this screening every year starting at age 86 if you have a 30-pack-year history of smoking and currently smoke or have quit within the past 15 years. Fecal occult blood test (FOBT) of the stool. You may have this test every year starting at age 86. Flexible sigmoidoscopy or colonoscopy. You may have a sigmoidoscopy every 5 years or a colonoscopy every 10 years starting at age 86. Hepatitis C blood test. Hepatitis B blood test. Sexually transmitted disease (STD) testing. Diabetes screening. This is done by checking your blood sugar (glucose) after you have not eaten for a while (fasting). You may have this done every 1-3 years. Bone density scan. This is done to screen for osteoporosis. You may have this done starting at age 86. Mammogram. This may be done every 1-2 years. Talk to your health care provider about how often you should have regular mammograms. Talk with your health care provider about your test results, treatment options, and if necessary, the need for more tests. Vaccines  Your health care provider may recommend certain vaccines, such as: Influenza vaccine. This is recommended every year. Tetanus, diphtheria, and acellular pertussis (Tdap, Td) vaccine. You may need a Td booster every 10 years. Zoster vaccine. You may need this after age 24. Pneumococcal 13-valent conjugate (PCV13) vaccine. One dose is recommended after age 86. Pneumococcal polysaccharide (PPSV23) vaccine. One dose is recommended after age 86. Talk to  your health care provider about which screenings and vaccines  you need and how often you need them. This information is not intended to replace advice given to you by your health care provider. Make sure you discuss any questions you have with your health care provider. Document Released: 02/25/2015 Document Revised: 10/19/2015 Document Reviewed: 11/30/2014 Elsevier Interactive Patient Education  2017 [employer].  Fall Prevention in the Home Falls can cause injuries. They can happen to people of all ages. There are many things you can do to make your home safe and to help prevent falls. What can I do on the outside of my home? Regularly fix the edges of walkways and driveways and fix any cracks. Remove anything that might make you trip as you walk through a door, such as a raised step or threshold. Trim any bushes or trees on the path to your home. Use bright outdoor lighting. Clear any walking paths of anything that might make someone trip, such as rocks or tools. Regularly check to see if handrails are loose or broken. Make sure that both sides of any steps have handrails. Any raised decks and porches should have guardrails on the edges. Have any leaves, snow, or ice cleared regularly. Use sand or salt on walking paths during winter. Clean up any spills in your garage right away. This includes oil or grease spills. What can I do in the bathroom? Use night lights. Install grab bars by the toilet and in the tub and shower. Do not use towel bars as grab bars. Use non-skid mats or decals in the tub or shower. If you need to sit down in the shower, use a plastic, non-slip stool. Keep the floor dry. Clean up any water that spills on the floor as soon as it happens. Remove soap buildup in the tub or shower regularly. Attach bath mats securely with double-sided non-slip rug tape. Do not have throw rugs and other things on the floor that can make you trip. What can I do in the bedroom? Use night lights. Make sure that you have a light by your bed that  is easy to reach. Do not use any sheets or blankets that are too big for your bed. They should not hang down onto the floor. Have a firm chair that has side arms. You can use this for support while you get dressed. Do not have throw rugs and other things on the floor that can make you trip. What can I do in the kitchen? Clean up any spills right away. Avoid walking on wet floors. Keep items that you use a lot in easy-to-reach places. If you need to reach something above you, use a strong step stool that has a grab bar. Keep electrical cords out of the way. Do not use floor polish or wax that makes floors slippery. If you must use wax, use non-skid floor wax. Do not have throw rugs and other things on the floor that can make you trip. What can I do with my stairs? Do not leave any items on the stairs. Make sure that there are handrails on both sides of the stairs and use them. Fix handrails that are broken or loose. Make sure that handrails are as long as the stairways. Check any carpeting to make sure that it is firmly attached to the stairs. Fix any carpet that is loose or worn. Avoid having throw rugs at the top or bottom of the stairs. If you do have throw rugs, attach  them to the floor with carpet tape. Make sure that you have a light switch at the top of the stairs and the bottom of the stairs. If you do not have them, ask someone to add them for you. What else can I do to help prevent falls? Wear shoes that: Do not have high heels. Have rubber bottoms. Are comfortable and fit you well. Are closed at the toe. Do not wear sandals. If you use a stepladder: Make sure that it is fully opened. Do not climb a closed stepladder. Make sure that both sides of the stepladder are locked into place. Ask someone to hold it for you, if possible. Clearly mark and make sure that you can see: Any grab bars or handrails. First and last steps. Where the edge of each step is. Use tools that help you  move around (mobility aids) if they are needed. These include: Canes. Walkers. Scooters. Crutches. Turn on the lights when you go into a dark area. Replace any light bulbs as soon as they burn out. Set up your furniture so you have a clear path. Avoid moving your furniture around. If any of your floors are uneven, fix them. If there are any pets around you, be aware of where they are. Review your medicines with your doctor. Some medicines can make you feel dizzy. This can increase your chance of falling. Ask your doctor what other things that you can do to help prevent falls. This information is not intended to replace advice given to you by your health care provider. Make sure you discuss any questions you have with your health care provider. Document Released: 11/25/2008 Document Revised: 07/07/2015 Document Reviewed: 03/05/2014 Elsevier Interactive Patient Education  2017 [employer].

## 2022-10-01 NOTE — Progress Notes (Unsigned)
Subjective:   Janet Wilson is a 88 y.o. female who presents for Medicare Annual (Subsequent) preventive examination at [facility].   Visit Complete: In person  Patient Medicare AWV questionnaire was completed by the patient on 10/01/22; I have confirmed that all information answered by patient is correct and no changes since this date.  Review of Systems     Cardiac Risk Factors include: advanced age (>48men, >61 women);hypertension     Objective:    Today's Vitals   10/01/22 1305 10/01/22 1315  BP: 132/76   Pulse: 88   Resp: 17   Temp: 98.1 F (36.7 C)   TempSrc: Temporal   SpO2: 99%   Weight: 105 lb 3.2 oz (47.7 kg)   Height: 5' 3.5" (1.613 m)   PainSc:  3    Wt Readings from Last 3 Encounters:  10/01/22 105 lb 3.2 oz (47.7 kg)  07/24/22 103 lb (46.7 kg)  05/01/22 107 lb 6.4 oz (48.7 kg)    Body mass index is 18.34 kg/m.     10/01/2022    1:09 PM 07/24/2022    2:47 PM 06/27/2022   10:54 AM 04/03/2022   11:49 AM 04/02/2022   10:00 PM 04/02/2022    4:07 PM 12/19/2021    4:47 PM  Advanced Directives  Does Patient Have a Medical Advance Directive? Yes Yes Yes No No No Yes  Type of Estate agent of [place];Living will;Out of facility DNR (pink MOST or yellow form) Healthcare Power of [place];Living will;Out of facility DNR (pink MOST or yellow form) Healthcare Power of [place];Living will    Healthcare Power of [place];Living will;Out of facility DNR (pink MOST or yellow form)  Does patient want to make changes to medical advance directive? No - Patient declined No - Patient declined     No - Patient declined  Copy of Healthcare Power of Attorney in Chart?       No - copy requested  Would patient like information on creating a medical advance directive?    No - Patient declined No - Patient declined No - Patient declined     Current Medications (verified) Outpatient Encounter Medications as of  10/01/2022  Medication Sig   brimonidine (ALPHAGAN) 0.2 % ophthalmic solution Place 1 drop into both eyes 2 (two) times daily.   Calcium Carbonate-Vitamin D (CALCIUM PLUS VITAMIN D PO) Take 1 tablet by mouth 2 (two) times a week.   Coenzyme Q10 (CO Q 10) 100 MG CAPS Take 100 mg by mouth 2 (two) times a week.   mirtazapine (REMERON) 15 MG tablet TAKE 1 TABLET BY MOUTH EVERYDAY AT BEDTIME   Omega-3 Fatty Acids (FISH OIL) 1200 MG CAPS Take 1,200 mg by mouth 2 (two) times a week.   omeprazole (PRILOSEC) 40 MG capsule TAKE 1 CAPSULE (40 MG TOTAL) BY MOUTH DAILY.   polyethylene glycol (MIRALAX / GLYCOLAX) 17 g packet Take 17 g by mouth daily.   rivastigmine (EXELON) 3 MG capsule Take 1 capsule (3 mg total) by mouth 2 (two) times daily.   rosuvastatin (CRESTOR) 10 MG tablet TAKE 1 TABLET BY MOUTH EVERY DAY   No facility-administered encounter medications on file as of 10/01/2022.    Allergies (verified) Lisinopril, Mold extract [trichophyton], Molds & smuts, Sulfa antibiotics, Codeine, and Oxycodone   History: Past Medical History:  Diagnosis Date   Aortic aneurysm (HCC)    Arthritis    OA BOTH KNEES AND HANDS   Atrial tachycardia  HX OF   GERD (gastroesophageal reflux disease)    Glaucoma    left eye   Goiter    CAUSING COUGH, HOARSINESS AND DRY THROAT   Headache(784.0)    MIGRAINES    Hyperlipidemia    Neuralgia    Overactive bladder    RLS (restless legs syndrome)    Temporal arteritis (HCC)    Past Surgical History:  Procedure Laterality Date   ARTERY BIOPSY Right 07/21/2012   Procedure: BIOPSY TEMPORAL ARTERY;  Surgeon: Currie Paris, MD;  Location: [facility];  Service: General;  Laterality: Right;   CERVICAL FUSION  2012   Dr. Venetia Maxon   EYE SURGERY     CATARACT EXTRACTION- BILATERAL   INCONTINENCE SURGERY     JOINT REPLACEMENT     right knee   LEFT SHOULDER SURGERY     RIGHT KNEE ARTHROSCOPY  11/2009     ROBOTIC ASSISTED BILATERAL SALPINGO OOPHERECTOMY Bilateral  12/08/2013   Procedure: ROBOTIC ASSISTED LAPAROSCOPIC BILATERAL SALPINGO OOPHORECTOMY WITH STAGING;  Surgeon: Adolphus Birchwood, MD;  Location: [facility];  Service: Gynecology;  Laterality: Bilateral;   TOTAL HIP ARTHROPLASTY Left 04/03/2022   Procedure: TOTAL HIP ARTHROPLASTY ANTERIOR APPROACH;  Surgeon: Sheral Apley, MD;  Location: [facility];  Service: Orthopedics;  Laterality: Left;   TOTAL KNEE ARTHROPLASTY  07/04/2011   Procedure: TOTAL KNEE ARTHROPLASTY;  Surgeon: Loanne Drilling, MD;  Location: [facility];  Service: Orthopedics;  Laterality: Right;   Family History  Problem Relation Age of Onset   Alzheimer's disease Mother    Heart attack Father    Social History   Socioeconomic History   Marital status: Married    Spouse name: Greggory Stallion   Number of children: 2   Years of education: college   Highest education level: Not on file  Occupational History   Occupation: retired    Associate Professor: RETIRED  Tobacco Use   Smoking status: Never   Smokeless tobacco: Never  Vaping Use   Vaping status: Never Used  Substance and Sexual Activity   Alcohol use: Yes    Alcohol/week: 1.0 standard drink of alcohol    Types: 1 Glasses of wine per week    Comment: SELDOM, very little   Drug use: No   Sexual activity: Yes  Other Topics Concern   Not on file  Social History Narrative   Pt lives at home with family.   Caffeine Use: Very little.    Patient is right handed       Social History      Diet? No       Do you drink/eat things with caffeine? Some coffee      Marital status?    Married                                What year were you married? 1961      Do you live in a house, apartment, assisted living, condo, trailer, etc.?  [name]       Is it one or more stories? one      How many persons live in your home? two      Do you have any pets in your home? No       Highest level of education completed? 14 years       Current or past profession:  Engineer, petroleum       Do you exercise?  Yes                        Type & how often? Water aerobic and balance class      Advanced Directives      Do you have a living will?  yes      Do you have a DNR form?        yes                          If not, do you want to discuss one?      Do you have signed POA/HPOA for forms? yes      Functional Status      Do you have difficulty bathing or dressing yourself?      Do you have difficulty preparing food or eating?       Do you have difficulty managing your medications?      Do you have difficulty managing your finances?      Do you have difficulty affording your medications?   Social Determinants of Health   Financial Resource Strain: Not on file  Food Insecurity: No Food Insecurity (04/02/2022)   Hunger Vital Sign    Worried About Running Out of Food in the Last Year: Never true    Ran Out of Food in the Last Year: Never true  Transportation Needs: No Transportation Needs (04/02/2022)   PRAPARE - Administrator, Civil Service (Medical): No    Lack of Transportation (Non-Medical): No  Physical Activity: Not on file  Stress: Not on file  Social Connections: Not on file    Tobacco Counseling Counseling given: Not Answered   Clinical Intake:  Pre-visit preparation completed: Yes  Pain : 0-10 Pain Score: 3  Pain Type: Acute pain Pain Location: Knee Pain Orientation: Left Pain Descriptors / Indicators: Aching Pain Onset: Today Pain Frequency: Occasional     BMI - recorded: 18.34 Nutritional Status: BMI <19  Underweight Nutritional Risks: None Diabetes: No  How often do you need to have someone help you when you read instructions, pamphlets, or other written materials from your doctor or pharmacy?: 2 - Rarely  Interpreter Needed?: No      Activities of Daily Living    10/01/2022    1:26 PM 10/01/2022    1:11 PM  In your present state of health, do you have any difficulty performing the following activities:  Hearing?  0  Vision?   0  Difficulty concentrating or making decisions?  0  Walking or climbing stairs?  1  Dressing or bathing?  0  Doing errands, shopping?  0  Preparing Food and eating ? N   Using the Toilet? N   In the past six months, have you accidently leaked urine? Y   Do you have problems with loss of bowel control? N   Managing your Medications? Y   Comment daughter fills bottle   Managing your Finances? Y   Housekeeping or managing your Housekeeping? Y     Patient Care Team: Mahlon Gammon, MD as PCP - General (Internal Medicine) Jake Bathe, MD as PCP - Cardiology (Cardiology) Anson Fret, MD as Consulting Physician (Neurology) Josephine Igo, DO as Consulting Physician (Pulmonary Disease) Luane School, OD (Optometry) Malvin Johns as Physician Assistant (Physician Assistant)  Indicate any recent Medical Services you may have received from other than [facility] providers in the past year (date  may be approximate).     Assessment:   This is a routine wellness examination for [place].  Hearing/Vision screen Hearing Screening - Comments:: Not in the last 12 month   Dietary issues and exercise activities discussed:     Goals Addressed             This Visit's Progress    Gain weight       Maintain weight Boost daily.        Depression Screen    10/01/2022    1:09 PM 07/24/2022    2:47 PM 01/16/2022    3:58 PM 12/19/2021    4:47 PM 03/19/2018   10:23 AM  PHQ 2/9 Scores  PHQ - 2 Score 0 1 0 0 0    Fall Risk    10/01/2022    1:09 PM 07/24/2022    2:47 PM 01/16/2022    3:58 PM 12/19/2021    4:47 PM 03/19/2018   10:23 AM  Fall Risk   Falls in the past year? 1 0 0 0 0  Number falls in past yr: 0 0 0 0 0  Injury with Fall? 1 0 0 0 0  Risk for fall due to : History of fall(s) No Fall Risks No Fall Risks No Fall Risks Medication side effect  Follow up Falls evaluation completed Falls evaluation completed Falls evaluation completed Falls evaluation completed  Education provided;Falls prevention discussed    MEDICARE RISK AT HOME: Medicare Risk at Home Any stairs in or around the home?: No If so, are there any without handrails?: No Home free of loose throw rugs in walkways, pet beds, electrical cords, etc?: Yes Adequate lighting in your home to reduce risk of falls?: Yes Life alert?: Yes Use of a cane, walker or w/c?: Yes Grab bars in the bathroom?: Yes Shower chair or bench in shower?: Yes Elevated toilet seat or a handicapped toilet?: Yes  TIMED UP AND GO:  Was the test performed?  Yes  Length of time to ambulate 10 feet: 11 sec Gait slow and steady with assistive device    Cognitive Function:    10/01/2022    1:11 PM 08/02/2015   12:57 PM  MMSE - Mini Mental State Exam  Orientation to time 5 5  Orientation to Place 5 5  Registration 3 3  Attention/ Calculation 5 5  Recall 3 3  Language- name 2 objects 2 2  Language- repeat 1 1  Language- follow 3 step command 3 3  Language- read & follow direction 1 1  Write a sentence 1 1  Copy design 1 1  Total score 30 30      05/07/2016   10:56 AM  Montreal Cognitive Assessment   Visuospatial/ Executive (0/5) 5  Naming (0/3) 3  Attention: Read list of digits (0/2) 2  Attention: Read list of letters (0/1) 1  Attention: Serial 7 subtraction starting at 100 (0/3) 3  Language: Repeat phrase (0/2) 2  Language : Fluency (0/1) 1  Abstraction (0/2) 1  Delayed Recall (0/5) 2  Orientation (0/6) 5  Total 25  Adjusted Score (based on education) 25      Immunizations Immunization History  Administered Date(s) Administered   Influenza Split 11/13/2012   Influenza, High Dose Seasonal PF 11/12/2016, 10/18/2017, 10/10/2018   Influenza,inj,quad, With Preservative 10/10/2018   Influenza-Unspecified 11/11/2008, 10/31/2009, 11/07/2011, 11/11/2013, 11/13/2016   Moderna Covid-19 Vaccine Bivalent Booster 81yrs & up 11/22/2021   Pneumococcal Conjugate-13 06/24/2013   Pneumococcal  Polysaccharide-23 09/03/2014  Respiratory Syncytial Virus Vaccine,Recomb Aduvanted(Arexvy) 11/22/2021   Td 12/11/2017   Zoster Recombinant(Shingrix) 08/10/2016, 11/19/2016   Zoster, Live 06/24/2013   Zoster, Unspecified 11/19/2016    TDAP status: Up to date  Flu Vaccine status: Due, Education has been provided regarding the importance of this vaccine. Advised may receive this vaccine at local pharmacy or Health Dept. Aware to provide a copy of the vaccination record if obtained from local pharmacy or Health Dept. Verbalized acceptance and understanding.  Pneumococcal vaccine status: Due, Education has been provided regarding the importance of this vaccine. Advised may receive this vaccine at local pharmacy or Health Dept. Aware to provide a copy of the vaccination record if obtained from local pharmacy or Health Dept. Verbalized acceptance and understanding.  Covid-19 vaccine status: Information provided on how to obtain vaccines.   Qualifies for Shingles Vaccine? Yes   Zostavax completed Yes   Shingrix Completed?: Yes  Screening Tests Health Maintenance  Topic Date Due   COVID-19 Vaccine (2 - Moderna risk series) 12/20/2021   INFLUENZA VACCINE  09/13/2022   Medicare Annual Wellness (AWV)  10/01/2023   DTaP/Tdap/Td (2 - Tdap) 12/12/2027   Pneumonia Vaccine 84+ Years old  Completed   DEXA SCAN  Completed   Zoster Vaccines- Shingrix  Completed   HPV VACCINES  Aged Out    Health Maintenance  Health Maintenance Due  Topic Date Due   COVID-19 Vaccine (2 - Moderna risk series) 12/20/2021   INFLUENZA VACCINE  09/13/2022    Colorectal cancer screening: No longer required.   Mammogram status: Completed Sprin g fo 2024 and was negative per patient . Repeat every yearaged out  Bone Density status: Completed Spring of 2024 . Results reflect: Bone density results: OSTEOPENIA. Repeat every 2 years.needed   Lung Cancer Screening: (Low Dose CT Chest recommended if Age 42-80 years, 20  pack-year currently smoking OR have quit w/in 15years.) does not qualify.   Lung Cancer Screening Referral: na  Additional Screening:  Hepatitis C Screening: does not qualify; Completed na  Vision Screening: Recommended annual ophthalmology exams for early detection of glaucoma and other disorders of the eye. Is the patient up to date with their annual eye exam?  Yes  Who is the provider or what is the name of the office in which the patient attends annual eye exams? [facility]  If pt is not established with a provider, would they like to be referred to a provider to establish care? No .   Dental Screening: Recommended annual dental exams for proper oral hygiene  Diabetic Foot Exam: not diabetic  Community Resource Referral / Chronic Care Management: CRR required this visit?  No   CCM required this visit?  No     Plan:     I have personally reviewed and noted the following in the patient's chart:   Medical and social history Use of alcohol, tobacco or illicit drugs  Current medications and supplements including opioid prescriptions. Patient is not currently taking opioid prescriptions. Functional ability and status Nutritional status Physical activity Advanced directives List of other physicians Hospitalizations, surgeries, and ER visits in previous 12 months Vitals Screenings to include cognitive, depression, and falls Referrals and appointments  In addition, I have reviewed and discussed with patient certain preventive protocols, quality metrics, and best practice recommendations. A written personalized care plan for preventive services as well as general preventive health recommendations were provided to patient.     Fletcher Anon, NP   10/01/2022   After Visit Summary: Given  to patient  Nurse Notes: Had mammogram and bone density need results.

## 2022-10-04 DIAGNOSIS — R490 Dysphonia: Secondary | ICD-10-CM | POA: Diagnosis not present

## 2022-10-10 ENCOUNTER — Other Ambulatory Visit: Payer: Medicare Other

## 2022-10-10 ENCOUNTER — Ambulatory Visit: Payer: Medicare Other | Admitting: Surgery

## 2022-10-24 DIAGNOSIS — L718 Other rosacea: Secondary | ICD-10-CM | POA: Diagnosis not present

## 2022-10-24 DIAGNOSIS — L821 Other seborrheic keratosis: Secondary | ICD-10-CM | POA: Diagnosis not present

## 2022-10-27 ENCOUNTER — Other Ambulatory Visit: Payer: Self-pay | Admitting: Internal Medicine

## 2022-11-14 ENCOUNTER — Inpatient Hospital Stay: Admission: RE | Admit: 2022-11-14 | Payer: Medicare Other | Source: Ambulatory Visit

## 2022-11-14 ENCOUNTER — Ambulatory Visit: Payer: Medicare Other | Admitting: Surgery

## 2022-11-20 ENCOUNTER — Encounter: Payer: Self-pay | Admitting: Internal Medicine

## 2022-11-20 ENCOUNTER — Other Ambulatory Visit: Payer: Self-pay | Admitting: *Deleted

## 2022-11-20 DIAGNOSIS — I10 Essential (primary) hypertension: Secondary | ICD-10-CM | POA: Diagnosis not present

## 2022-11-20 DIAGNOSIS — M316 Other giant cell arteritis: Secondary | ICD-10-CM

## 2022-11-20 DIAGNOSIS — R519 Headache, unspecified: Secondary | ICD-10-CM

## 2022-11-20 LAB — HEPATIC FUNCTION PANEL
ALT: 23 U/L (ref 7–35)
ALT: 23 U/L (ref 7–35)
AST: 28 (ref 13–35)
AST: 28 (ref 13–35)
Alkaline Phosphatase: 110 (ref 25–125)
Alkaline Phosphatase: 110 (ref 25–125)
Bilirubin, Total: 0.6
Bilirubin, Total: 0.6

## 2022-11-20 LAB — CBC AND DIFFERENTIAL
HCT: 40 (ref 36–46)
Hemoglobin: 13.5 (ref 12.0–16.0)
Platelets: 191 10*3/uL (ref 150–400)
WBC: 4.8

## 2022-11-20 LAB — BASIC METABOLIC PANEL
BUN: 25 — AB (ref 4–21)
BUN: 25 — AB (ref 4–21)
CO2: 24 — AB (ref 13–22)
CO2: 24 — AB (ref 13–22)
Chloride: 102 (ref 99–108)
Chloride: 102 (ref 99–108)
Creatinine: 0.8 (ref 0.5–1.1)
Creatinine: 0.8 (ref 0.5–1.1)
Glucose: 75
Glucose: 75
Potassium: 4.3 meq/L (ref 3.5–5.1)
Potassium: 4.3 meq/L (ref 3.5–5.1)
Sodium: 139 (ref 137–147)
Sodium: 139 (ref 137–147)

## 2022-11-20 LAB — COMPREHENSIVE METABOLIC PANEL
Albumin: 4.4 (ref 3.5–5.0)
Albumin: 4.4 (ref 3.5–5.0)
Calcium: 9.5 (ref 8.7–10.7)
Calcium: 9.5 (ref 8.7–10.7)
Globulin: 2.6
Globulin: 2.6
eGFR: 78

## 2022-11-20 LAB — LIPID PANEL
Cholesterol: 156 (ref 0–200)
HDL: 64 (ref 35–70)
LDL Cholesterol: 81
Triglycerides: 52 (ref 40–160)

## 2022-11-20 LAB — CBC: RBC: 4.4 (ref 3.87–5.11)

## 2022-11-20 LAB — TSH: TSH: 1.2 (ref 0.41–5.90)

## 2022-11-24 ENCOUNTER — Other Ambulatory Visit: Payer: Self-pay | Admitting: Internal Medicine

## 2022-11-26 ENCOUNTER — Other Ambulatory Visit (INDEPENDENT_AMBULATORY_CARE_PROVIDER_SITE_OTHER): Payer: Self-pay

## 2022-11-26 DIAGNOSIS — M316 Other giant cell arteritis: Secondary | ICD-10-CM

## 2022-11-26 DIAGNOSIS — Z0289 Encounter for other administrative examinations: Secondary | ICD-10-CM

## 2022-11-26 DIAGNOSIS — R519 Headache, unspecified: Secondary | ICD-10-CM

## 2022-11-27 ENCOUNTER — Non-Acute Institutional Stay: Payer: Medicare Other | Admitting: Internal Medicine

## 2022-11-27 ENCOUNTER — Encounter: Payer: Self-pay | Admitting: Internal Medicine

## 2022-11-27 VITALS — BP 128/72 | HR 86 | Temp 97.8°F | Resp 17 | Ht 63.5 in | Wt 108.2 lb

## 2022-11-27 DIAGNOSIS — Z96642 Presence of left artificial hip joint: Secondary | ICD-10-CM

## 2022-11-27 DIAGNOSIS — K219 Gastro-esophageal reflux disease without esophagitis: Secondary | ICD-10-CM | POA: Diagnosis not present

## 2022-11-27 DIAGNOSIS — E782 Mixed hyperlipidemia: Secondary | ICD-10-CM

## 2022-11-27 DIAGNOSIS — R634 Abnormal weight loss: Secondary | ICD-10-CM | POA: Diagnosis not present

## 2022-11-27 DIAGNOSIS — G3184 Mild cognitive impairment, so stated: Secondary | ICD-10-CM

## 2022-11-27 DIAGNOSIS — E042 Nontoxic multinodular goiter: Secondary | ICD-10-CM | POA: Diagnosis not present

## 2022-11-27 DIAGNOSIS — I7121 Aneurysm of the ascending aorta, without rupture: Secondary | ICD-10-CM

## 2022-11-27 DIAGNOSIS — R490 Dysphonia: Secondary | ICD-10-CM | POA: Diagnosis not present

## 2022-11-27 DIAGNOSIS — F5101 Primary insomnia: Secondary | ICD-10-CM | POA: Diagnosis not present

## 2022-11-27 LAB — SEDIMENTATION RATE: Sed Rate: 8 mm/h (ref 0–40)

## 2022-11-27 LAB — C-REACTIVE PROTEIN: CRP: 2 mg/L (ref 0–10)

## 2022-11-27 NOTE — Progress Notes (Unsigned)
Location:  [facility] Service:  Clinic (12)  Provider:   Code Status: *** Goals of Care:     10/01/2022    1:09 PM  Advanced Directives  Does Patient Have a Medical Advance Directive? Yes  Type of Estate agent of [place];Living will;Out of facility DNR (pink MOST or yellow form)  Does patient want to make changes to medical advance directive? No - Patient declined     Chief Complaint  Patient presents with   Medical Management of Chronic Issues    Patient is being seen for a 4 month follow up. And discuss speech therapy     HPI: Patient is a 88 y.o. female seen today for medical management of chronic diseases.     Past Medical History:  Diagnosis Date   Aortic aneurysm (HCC)    Arthritis    OA BOTH KNEES AND HANDS   Atrial tachycardia (HCC)    HX OF   GERD (gastroesophageal reflux disease)    Glaucoma    left eye   Goiter    CAUSING COUGH, HOARSINESS AND DRY THROAT   Headache(784.0)    MIGRAINES    Hyperlipidemia    Neuralgia    Overactive bladder    RLS (restless legs syndrome)    Temporal arteritis (HCC)     Past Surgical History:  Procedure Laterality Date   ARTERY BIOPSY Right 07/21/2012   Procedure: BIOPSY TEMPORAL ARTERY;  Surgeon: Currie Paris, MD;  Location: [facility];  Service: General;  Laterality: Right;   CERVICAL FUSION  2012   Dr. Venetia Maxon   EYE SURGERY     CATARACT EXTRACTION- BILATERAL   INCONTINENCE SURGERY     JOINT REPLACEMENT     right knee   LEFT SHOULDER SURGERY     RIGHT KNEE ARTHROSCOPY  11/2009     ROBOTIC ASSISTED BILATERAL SALPINGO OOPHERECTOMY Bilateral 12/08/2013   Procedure: ROBOTIC ASSISTED LAPAROSCOPIC BILATERAL SALPINGO OOPHORECTOMY WITH STAGING;  Surgeon: Adolphus Birchwood, MD;  Location: [facility];  Service: Gynecology;  Laterality: Bilateral;   TOTAL HIP ARTHROPLASTY Left 04/03/2022   Procedure: TOTAL HIP ARTHROPLASTY ANTERIOR APPROACH;  Surgeon: Sheral Apley, MD;   Location: [facility];  Service: Orthopedics;  Laterality: Left;   TOTAL KNEE ARTHROPLASTY  07/04/2011   Procedure: TOTAL KNEE ARTHROPLASTY;  Surgeon: Loanne Drilling, MD;  Location: [facility];  Service: Orthopedics;  Laterality: Right;    Allergies  Allergen Reactions   Lisinopril Other (See Comments) and Cough    Very low BP/"very tired" also   Mold Extract [Trichophyton] Other (See Comments)    Headaches and migraines   Molds & Smuts Other (See Comments)    Headaches and migraines   Sulfa Antibiotics    Codeine Nausea Only   Oxycodone Nausea Only    Outpatient Encounter Medications as of 11/27/2022  Medication Sig   brimonidine (ALPHAGAN) 0.2 % ophthalmic solution Place 1 drop into both eyes 2 (two) times daily.   Calcium Carbonate-Vitamin D (CALCIUM PLUS VITAMIN D PO) Take 1 tablet by mouth 2 (two) times a week.   Coenzyme Q10 (CO Q 10) 100 MG CAPS Take 100 mg by mouth 2 (two) times a week.   METROCREAM 0.75 % cream Apply 1 Application topically.   mirtazapine (REMERON) 15 MG tablet TAKE 1 TABLET BY MOUTH EVERYDAY AT BEDTIME   Omega-3 Fatty Acids (FISH OIL) 1200 MG CAPS Take 1,200 mg by mouth 2 (two) times a week.  omeprazole (PRILOSEC) 40 MG capsule TAKE 1 CAPSULE (40 MG TOTAL) BY MOUTH DAILY.   polyethylene glycol (MIRALAX / GLYCOLAX) 17 g packet Take 17 g by mouth daily.   rivastigmine (EXELON) 3 MG capsule Take 1 capsule (3 mg total) by mouth 2 (two) times daily.   rosuvastatin (CRESTOR) 10 MG tablet TAKE 1 TABLET BY MOUTH EVERY DAY   senna (SENOKOT) 8.6 MG TABS tablet Take 1 tablet by mouth.   No facility-administered encounter medications on file as of 11/27/2022.    Review of Systems:  Review of Systems  Health Maintenance  Topic Date Due   COVID-19 Vaccine (3 - Moderna risk series) 12/13/2022   Medicare Annual Wellness (AWV)  10/01/2023   DTaP/Tdap/Td (2 - Tdap) 12/12/2027   Pneumonia Vaccine 64+ Years old  Completed   INFLUENZA VACCINE  Completed   DEXA SCAN   Completed   Zoster Vaccines- Shingrix  Completed   HPV VACCINES  Aged Out    Physical Exam: Vitals:   11/27/22 1323  BP: 128/72  Pulse: 86  Resp: 17  Temp: 97.8 F (36.6 C)  TempSrc: Temporal  SpO2: 92%  Weight: 108 lb 3.2 oz (49.1 kg)  Height: 5' 3.5" (1.613 m)   Body mass index is 18.87 kg/m. Physical Exam  Labs reviewed: Basic Metabolic Panel: Recent Labs    04/02/22 1650 04/03/22 0330 04/04/22 0808 11/20/22 0000  NA 144  --  138 139  139  K 3.7  --  3.7 4.3  4.3  CL 110  --  105 102  102  CO2 26  --  26 24*  24*  GLUCOSE 86  --  121*  --   BUN 25*  --  20 25*  25*  CREATININE 0.60  --  0.68 0.8  0.8  CALCIUM 8.5*  --  8.5* 9.5  9.5  MG 1.9  --   --   --   PHOS 3.2  --   --   --   TSH  --  0.281*  --  1.20   Liver Function Tests: Recent Labs    04/02/22 1650 11/20/22 0000  AST 25 28  28   ALT 20 23  23   ALKPHOS 58 110  110  BILITOT 0.6  --   PROT 6.2*  --   ALBUMIN 3.6 4.4  4.4   No results for input(s): "LIPASE", "AMYLASE" in the last 8760 hours. No results for input(s): "AMMONIA" in the last 8760 hours. CBC: Recent Labs    04/02/22 1650 04/04/22 0808 11/20/22 0000  WBC 5.5 9.2 4.8  NEUTROABS 4.1  --   --   HGB 12.2 12.3 13.5  HCT 38.9 38.3 40  MCV 97.7 95.5  --   PLT 116* 144* 191   Lipid Panel: Recent Labs    11/20/22 0000  CHOL 156  HDL 64  LDLCALC 81  TRIG 52   Lab Results  Component Value Date   HGBA1C 5.4 05/21/2014    Procedures since last visit: No results found.  Assessment/Plan There are no diagnoses linked to this encounter.   Labs/tests ordered:  * No order type specified * Next appt:  Visit date not found

## 2022-11-28 ENCOUNTER — Ambulatory Visit
Admission: RE | Admit: 2022-11-28 | Discharge: 2022-11-28 | Disposition: A | Discharge status: Home / Self Care | Payer: Medicare Other | Source: Ambulatory Visit | Attending: Surgery | Admitting: Surgery

## 2022-11-28 ENCOUNTER — Telehealth: Payer: Self-pay | Admitting: *Deleted

## 2022-11-28 DIAGNOSIS — I7121 Aneurysm of the ascending aorta, without rupture: Secondary | ICD-10-CM | POA: Diagnosis not present

## 2022-11-28 DIAGNOSIS — I700 Atherosclerosis of aorta: Secondary | ICD-10-CM | POA: Diagnosis not present

## 2022-11-28 DIAGNOSIS — I3139 Other pericardial effusion (noninflammatory): Secondary | ICD-10-CM | POA: Diagnosis not present

## 2022-11-28 DIAGNOSIS — I517 Cardiomegaly: Secondary | ICD-10-CM | POA: Diagnosis not present

## 2022-11-28 MED ORDER — IOPAMIDOL (ISOVUE-370) INJECTION 76%
200.0000 mL | Freq: Once | INTRAVENOUS | Status: AC | PRN
  Administered 2022-11-28: 82 mL via INTRAVENOUS

## 2022-11-28 NOTE — Telephone Encounter (Signed)
-----   Message from Anson Fret sent at 11/27/2022  5:20 PM EDT ----- Labs are normal I am happy to see her if she needs will you let her know and schedule if she likes? I have openings in December due to openn emg spots

## 2022-11-28 NOTE — Telephone Encounter (Signed)
Spoke to daughter (checked DPR) gave pt lab work results  Made pt f/u appointment 01/2023 placed pt on wait list for sooner appointment

## 2022-12-04 DIAGNOSIS — M216X2 Other acquired deformities of left foot: Secondary | ICD-10-CM | POA: Diagnosis not present

## 2022-12-04 DIAGNOSIS — L602 Onychogryphosis: Secondary | ICD-10-CM | POA: Diagnosis not present

## 2022-12-04 DIAGNOSIS — M2041 Other hammer toe(s) (acquired), right foot: Secondary | ICD-10-CM | POA: Diagnosis not present

## 2022-12-04 DIAGNOSIS — L84 Corns and callosities: Secondary | ICD-10-CM | POA: Diagnosis not present

## 2022-12-04 DIAGNOSIS — M2042 Other hammer toe(s) (acquired), left foot: Secondary | ICD-10-CM | POA: Diagnosis not present

## 2022-12-04 DIAGNOSIS — M216X1 Other acquired deformities of right foot: Secondary | ICD-10-CM | POA: Diagnosis not present

## 2022-12-05 ENCOUNTER — Encounter: Payer: Self-pay | Admitting: Surgery

## 2022-12-05 ENCOUNTER — Ambulatory Visit: Payer: Medicare Other | Admitting: Surgery

## 2022-12-05 VITALS — BP 121/75 | HR 86 | Resp 18 | Ht 63.0 in | Wt 107.0 lb

## 2022-12-05 DIAGNOSIS — I7121 Aneurysm of the ascending aorta, without rupture: Secondary | ICD-10-CM | POA: Diagnosis not present

## 2022-12-05 NOTE — Progress Notes (Signed)
HPI:  The patient is an 88 year old woman with history of a stable 5.6 x 5.7 cm fusiform ascending aortic aneurysm extending from the sinotubular junction up to the mid aortic arch beyond the left common carotid artery.  We have decided to continue following this without surgery due to her advanced age, mild neurocognitive dysfunction, and frailty.  I last saw her in August 2023.  In February 2024 she fell and suffered a left hip fracture and underwent total left hip arthroplasty.  She has had a slow recovery from that.  She also has problems with her left knee and right shoulder which have been chronic.  She denies any chest or back pain.  She is here today with her daughter and husband.  Current Outpatient Medications  Medication Sig Dispense Refill   brimonidine (ALPHAGAN) 0.2 % ophthalmic solution Place 1 drop into both eyes 2 (two) times daily.     METROCREAM 0.75 % cream Apply 1 Application topically.     mirtazapine (REMERON) 15 MG tablet TAKE 1 TABLET BY MOUTH EVERYDAY AT BEDTIME 90 tablet 1   omeprazole (PRILOSEC) 40 MG capsule TAKE 1 CAPSULE (40 MG TOTAL) BY MOUTH DAILY. 90 capsule 3   polyethylene glycol (MIRALAX / GLYCOLAX) 17 g packet Take 17 g by mouth daily. 14 each 0   rivastigmine (EXELON) 3 MG capsule Take 1 capsule (3 mg total) by mouth 2 (two) times daily. 60 capsule 3   rosuvastatin (CRESTOR) 10 MG tablet TAKE 1 TABLET BY MOUTH EVERY DAY 90 tablet 3   senna (SENOKOT) 8.6 MG TABS tablet Take 1 tablet by mouth.     No current facility-administered medications for this visit.     Physical Exam: BP 121/75   Pulse 86   Resp 18   Ht 5\' 3"  (1.6 m)   Wt 107 lb (48.5 kg)   LMP  (LMP Unknown)   SpO2 98% Comment: RA  BMI 18.95 kg/m  She looks frail. Cardiac exam shows a regular rate and rhythm and normal heart sounds.  There is no murmur. Lungs are clear.  Diagnostic Tests:  ADDENDUM REPORT: 11/28/2022 16:54   ADDENDUM: Age indeterminate mild superior endplate  fracture at L1, new compared to prior CT from 2023     Electronically Signed   By: Jasmine Pang M.D.   On: 11/28/2022 16:54    Addended by Adrian Prows, MD on 11/28/2022  4:56 PM    Study Result  Narrative & Impression  CLINICAL DATA:  Follow-up aneurysm   EXAM: CT ANGIOGRAPHY CHEST WITH CONTRAST   TECHNIQUE: Multidetector CT imaging of the chest was performed using the standard protocol during bolus administration of intravenous contrast. Multiplanar CT image reconstructions and MIPs were obtained to evaluate the vascular anatomy.   RADIATION DOSE REDUCTION: This exam was performed according to the departmental dose-optimization program which includes automated exposure control, adjustment of the mA and/or kV according to patient size and/or use of iterative reconstruction technique.   CONTRAST:  82mL ISOVUE-370 IOPAMIDOL (ISOVUE-370) INJECTION 76%   COMPARISON:  CT 10/04/2021, 08/29/2021, 08/17/2020   FINDINGS: Cardiovascular: Mild aortic atherosclerosis with mild mural thrombus at the arch. Coronary vascular calcifications. Cardiomegaly. Trace pericardial effusion. Previously noted enhancing structure adjacent to the GE junction on the prior exam is not clearly identified on today's exam.   Sinus of Valsalva measures 3.5 cm, previously 3.6 cm.   Sinotubular junction measures 3.5 cm, previously 3.5 cm.   Mid ascending aorta measures 6.1 x 6  cm on series 4, image 80, previously 5.8 x 5.8 cm when measured in similar fashion.   Transverse arch diameter of 3.4 cm, previously 3.6 cm.   Proximal descending thoracic aorta at the level of the pulmonary trunk measures 2.9 cm, previously 2.9 cm.   Mediastinum/Nodes: Midline trachea. Heterogenous multinodular thyroid. This has been evaluated on previous imaging. (ref: J Am Coll Radiol. 2015 Feb;12(2): 143-50). No suspicious lymph nodes. Esophagus within normal limits.   Lungs/Pleura: No acute airspace disease,  pleural effusion, or pneumothorax   Upper Abdomen: No acute finding. Partially imaged renal parapelvic cysts, no specific imaging follow-up is recommended.   Musculoskeletal: Mild superior endplate deformity at L1   Review of the MIP images confirms the above findings.   IMPRESSION: 1. Aortic atherosclerosis. Ascending aortic aneurysm measures up to 6.1 cm maximum, increased compared with most recent prior maximum measurement of 5.8 cm. Recommend semi-annual imaging followup by CTA or MRA and referral to cardiothoracic surgery if not already obtained. This recommendation follows 2010 ACCF/AHA/AATS/ACR/ASA/SCA/SCAI/SIR/STS/SVM Guidelines for the Diagnosis and Management of Patients With Thoracic Aortic Disease. Circulation. 2010; 121: [id number]. 2. Cardiomegaly with trace pericardial effusion. 3. Clear lung fields   Aortic Atherosclerosis (ICD10-I70.0).   Aortic aneurysm NOS (ICD10-I71.9).   Electronically Signed: By: Jasmine Pang M.D. On: 11/28/2022 16:28       Impression:  Her ascending aortic aneurysm has enlarged slightly to 6.1 cm compared to her scan 1 year ago when it was measured at 5.8 cm.  This extends from the sinotubular junction up to the midportion of the aortic arch beyond the left common carotid artery.  Given her advanced age and frailty I do not think she is a candidate for surgical repair.  This would require sternotomy for cardiopulmonary bypass and circulatory arrest with replacement of part of her aortic arch and bypasses to the innominate and left carotid artery with replacement of the ascending aorta.  I do not think she would get through that surgery and recover to become independent.  Risk of operative complications would be significant.  I reviewed the CT images with the patient and her family and answered all their questions.  They are in agreement with nonsurgical treatment.  I stressed the importance of continued good blood pressure control in  preventing further enlargement and acute aortic dissection.  They would like to continue following this for prognostic purposes.  Plan:  I will plan to see her back in 1 year with a CTA of the chest for aortic surveillance.  I spent 15 minutes performing this established patient evaluation and > 50% of this time was spent face to face counseling and coordinating the care of this patient's aortic aneurysm.    Alleen Borne, MD [facility] [phone]

## 2022-12-31 DIAGNOSIS — H401422 Capsular glaucoma with pseudoexfoliation of lens, left eye, moderate stage: Secondary | ICD-10-CM | POA: Diagnosis not present

## 2023-01-29 ENCOUNTER — Ambulatory Visit (INDEPENDENT_AMBULATORY_CARE_PROVIDER_SITE_OTHER): Payer: Self-pay | Admitting: Neurology

## 2023-01-29 DIAGNOSIS — G3184 Mild cognitive impairment, so stated: Secondary | ICD-10-CM

## 2023-01-29 DIAGNOSIS — I639 Cerebral infarction, unspecified: Secondary | ICD-10-CM

## 2023-01-29 DIAGNOSIS — G43611 Persistent migraine aura with cerebral infarction, intractable, with status migrainosus: Secondary | ICD-10-CM

## 2023-01-29 NOTE — Progress Notes (Signed)
Deued to reschedule due to physician advise (lots f staff out with illness) and cannot do video, will call with follow up

## 2023-01-31 ENCOUNTER — Other Ambulatory Visit: Payer: Self-pay | Admitting: Internal Medicine

## 2023-02-20 ENCOUNTER — Other Ambulatory Visit: Payer: Self-pay | Admitting: Internal Medicine

## 2023-02-22 ENCOUNTER — Encounter: Payer: Self-pay | Admitting: Neurology

## 2023-02-22 ENCOUNTER — Ambulatory Visit (INDEPENDENT_AMBULATORY_CARE_PROVIDER_SITE_OTHER): Payer: Medicare Other | Admitting: Neurology

## 2023-02-22 ENCOUNTER — Ambulatory Visit: Payer: Medicare Other | Admitting: Neurology

## 2023-02-22 VITALS — BP 134/93 | HR 117 | Ht 63.0 in | Wt 105.0 lb

## 2023-02-22 DIAGNOSIS — R296 Repeated falls: Secondary | ICD-10-CM | POA: Diagnosis not present

## 2023-02-22 DIAGNOSIS — R269 Unspecified abnormalities of gait and mobility: Secondary | ICD-10-CM | POA: Diagnosis not present

## 2023-02-22 DIAGNOSIS — R4189 Other symptoms and signs involving cognitive functions and awareness: Secondary | ICD-10-CM | POA: Diagnosis not present

## 2023-02-22 DIAGNOSIS — F03A0 Unspecified dementia, mild, without behavioral disturbance, psychotic disturbance, mood disturbance, and anxiety: Secondary | ICD-10-CM | POA: Diagnosis not present

## 2023-02-22 DIAGNOSIS — F0390 Unspecified dementia without behavioral disturbance: Secondary | ICD-10-CM | POA: Diagnosis not present

## 2023-02-22 DIAGNOSIS — G20C Parkinsonism, unspecified: Secondary | ICD-10-CM | POA: Diagnosis not present

## 2023-02-22 NOTE — Progress Notes (Signed)
 [facility]    Provider:  Dr Ines Referring Provider: Charlanne Fredia CROME, MD Primary Care Physician:  Charlanne Fredia CROME, MD   CC:  Memory changes, word-finding difficult, behavior, motor symptoms  Discussed moving to assisted living nd her husband zachary Sous to ENT and has raspy voice and ENT mentioned parkinson's disease' Losing weight , has depression her husband is very ill, she feels her social life has decreased, husband having hallucinations and agitation Extended visit with patient's daughter and patient's sister in law who are ver concerned for patient  Specific Symptoms:   Tremor: no tremors.   Voice: voice is softer Sleep: trouble staying asleep              Vivid Dreams:  Yes.               Acting out dreams:  No. Wet Pillows: having a hard time with secretions and wet pillos Postural symptoms:  no             Falls? she has fallen, she doesn't know why Bradykinesia symptoms: daughter and sister in law state shuffling and walking slow Loss of smell:  Yes.   Loss of taste:  Yes.   Urinary Incontinence:  Yes. Difficulty Swallowing:  she coughs a lot. She has stairs  Handwriting, micrographia  Trouble with ADL's:  Yes.   (able to do it but slower than in the past)             Trouble buttoning clothing: Yes.   Depression:  Yes.   Memory changes:  Yes.     Hallucinations:  No.             visual distortions: No. N/V:  constipation Lightheaded:  just a little bit when standing             Syncope: no  Patient complains of symptoms per HPI as well as the following symptoms: occ headaches but last esr/crp normal, prior temporal arteritis . Pertinent negatives and positives per HPI. All others negative  Interval history 12/16/2017: She is improved. And formal neurocognitive testing did not show progression, in fact improvement. Here with daughter who provides information. The last testing was improved, normal as far as cognition. At night with nsomnia  she thinks of movie stars and movies. She is doing well on the neupro  patch forRLS. She gets cramps at night. Will try gabapentin  for RLS and also for her cramping. She feels has difficulty with expression, she stammers, recommended speech therapy.   07/2017: Significant decline, agitation and frustration, changing personality, memory loss. She is having difficulty with words, she forgets words, she can't get it out, she sometimes has to describe the item she is thinking about. They are going swimming, loves [facility]. They do water  aerobics.  Husband has had surgery andis getting over it. She is more active than they have been in the past. Husband is her armed forces logistics/support/administrative officer. Having trouble with words, she is trying to jot down words in a book and study them. She loses things or forget them somewhere. She may place her wallet in the garage and then forget it is there and she finds it. She left her phone in the garden. Short term is worsening. She doesn't remember appointments. She forgot about her blood tests. Still cooking and backing but very slow with directions, takes long to do complicated tasks she has to think more. Still driving. No accidents. She forgot how to put gas in the car,  her husband had to show her.   Interval history 12/11/2016: Patient returns today with her daughter and husband to discuss neurocognitive testing which revealed mild cognitive impairment. I discussed that given her family history she is more at risk to progress to Alzheimer's disease. A portion of people with mild cognitive impairment do proceed to dementia. At this point I recommend trying to increase [employer] . Discussed clinical trials and patient is not interested. At a long discussion about mild cognitive impairment, things that we can do to further delineate such as FDG PET scans, other specialized scanning, research opportunities. Husband and daughter do feel as though she is more irritable and her short-term memory is  worsening.   Interval history 05/07/2016: Janet Wilson Wilson is a 89 y.o. female here as a referral from Dr. Delice for memory problems. She has a past medical history of arthritis, glaucoma, temporal arteritis. She is here with daughter and husband. Started on Aricept  at last appointment. She returns with worsening memory complaints. Father had Alzheimers in his 61s.  She has been taking Aricept  5 mg for 6 months, half of the 10 mg tab as the 10 mg tablet caused side effects. Patient's memory continues to decline. Had a discussion with husband and daughter, she is becoming more disoriented and in familiar places, she is losing things more, she is forgetting more appointments, dates and conversations.  Unfortunately patient denies any changes in his quite agitated with her family recently. Today I had to be very gentle when suggesting formal neurocognitive testing, I did tell her that this is something we should do especially in people with family history inordinate to make her upset. Family really wants her to have it done for baseline and to evaluate her current cognition which I think is a good idea. Also discussed with him our clinical trials [facility] today which I think she would be wonderful for if she agrees to be part of it.   HPI:  Janet Wilson Wilson is a 89 y.o. female here as a referral from Dr. Delice for memory problems. She has a past medical history of arthritis, glaucoma, temporal arteritis. She is here with daughter and husband if there is anything that she started to exhibit problems with her memory. She is having memory lapses, increased frustration especially when this is mentioned to her, some increased episodes of anger, crying episodes. There are no mood issues but possibly she feels defensive when they point out. She doesn't remember.  She is having episodes of memory loss, she forgot the details of her knee surgery. Also such as when she insists on things and then gets angry if she  is proven wrong such as details on driving. Started within the last 3 months. She never had this behavior in the past. Denies any mood changes or depression. Father had Alzheimers. She has lost her sense of smell 15 years ago. She forgets appointments, gets the wrong date, forgets the dates o events, misses appointments. Husband had always done the bills. She tried to send an email and couldn't figure it out. Pain in the back of the head is better. Kurt how old her father was when he died, maybe 6, they were closed but he had suffered from dementia for many years without point. Patient reports she has Word-findning issues, gets confused especially with driving, some episodes of confusion. She insisted she had a knee replacement when she didn't. Balance is good but she has had a few falls mostly mechanical, where she trips  over something, or when she was gardening and pulled too hard to get a weed up. No other focal neurologic deficits or complaints.   Review of Systems: Patient complains of symptoms per HPI as well as the following symptoms: Blurred vision. No shortness of breath or chest pain. Pertinent negatives per HPI. All others negative.  Social History   Socioeconomic History   Marital status: Married    Spouse name: Zachary   Number of children: 2   Years of education: college   Highest education level: Not on file  Occupational History   Occupation: retired    Associate Professor: RETIRED  Tobacco Use   Smoking status: Never   Smokeless tobacco: Never  Vaping Use   Vaping status: Never Used  Substance and Sexual Activity   Alcohol use: Yes    Alcohol/week: 1.0 standard drink of alcohol    Types: 1 Glasses of wine per week    Comment: SELDOM, very little   Drug use: No   Sexual activity: Yes  Other Topics Concern   Not on file  Social History Narrative   Pt lives at home with husband.   Caffeine Use: Very little.    Patient is right handed       Social History      Diet? No        Do you drink/eat things with caffeine? Some coffee      Marital status?    Married                                What year were you married? 1961      Do you live in a house, apartment, assisted living, condo, trailer, etc.?  [place]       Is it one or more stories? one      How many persons live in your home? two      Do you have any pets in your home? No       Highest level of education completed? 14 years       Current or past profession:  Engineer, Petroleum       Do you exercise?              Yes                        Type & how often? Water  aerobic and balance class      Advanced Directives      Do you have a living will?  yes      Do you have a DNR form?        yes                          If not, do you want to discuss one?      Do you have signed POA/HPOA for forms? yes      Functional Status      Do you have difficulty bathing or dressing yourself?      Do you have difficulty preparing food or eating?       Do you have difficulty managing your medications?      Do you have difficulty managing your finances?      Do you have difficulty affording your medications?   Social Drivers of Corporate Investment Banker Strain: Not on file  Food Insecurity: No Food  Insecurity (04/02/2022)   Hunger Vital Sign    Worried About Running Out of Food in the Last Year: Never true    Ran Out of Food in the Last Year: Never true  Transportation Needs: No Transportation Needs (04/02/2022)   PRAPARE - Administrator, Civil Service (Medical): No    Lack of Transportation (Non-Medical): No  Physical Activity: Not on file  Stress: Not on file  Social Connections: Not on file  Intimate Partner Violence: Not At Risk (04/02/2022)   Humiliation, Afraid, Rape, and Kick questionnaire    Fear of Current or Ex-Partner: No    Emotionally Abused: No    Physically Abused: No    Sexually Abused: No    Family History  Problem Relation Age of Onset   Heart attack Mother     Alzheimer's disease Father     Past Medical History:  Diagnosis Date   Aortic aneurysm (HCC)    Arthritis    OA BOTH KNEES AND HANDS   Atrial tachycardia (HCC)    HX OF   GERD (gastroesophageal reflux disease)    Glaucoma    left eye   Goiter    CAUSING COUGH, HOARSINESS AND DRY THROAT   Headache(784.0)    MIGRAINES    Hyperlipidemia    Neuralgia    Overactive bladder    RLS (restless legs syndrome)    Temporal arteritis (HCC)     Past Surgical History:  Procedure Laterality Date   ARTERY BIOPSY Right 07/21/2012   Procedure: BIOPSY TEMPORAL ARTERY;  Surgeon: Sherlean JINNY Laughter, MD;  Location: [facility] OR;  Service: General;  Laterality: Right;   CERVICAL FUSION  2012   Dr. Unice   EYE SURGERY     CATARACT EXTRACTION- BILATERAL   INCONTINENCE SURGERY     JOINT REPLACEMENT     right knee   LEFT SHOULDER SURGERY     RIGHT KNEE ARTHROSCOPY  11/2009     ROBOTIC ASSISTED BILATERAL SALPINGO OOPHERECTOMY Bilateral 12/08/2013   Procedure: ROBOTIC ASSISTED LAPAROSCOPIC BILATERAL SALPINGO OOPHORECTOMY WITH STAGING;  Surgeon: Maurilio Ship, MD;  Location: [facility];  Service: Gynecology;  Laterality: Bilateral;   TOTAL HIP ARTHROPLASTY Left 04/03/2022   Procedure: TOTAL HIP ARTHROPLASTY ANTERIOR APPROACH;  Surgeon: Beverley Evalene BIRCH, MD;  Location: [facility];  Service: Orthopedics;  Laterality: Left;   TOTAL KNEE ARTHROPLASTY  07/04/2011   Procedure: TOTAL KNEE ARTHROPLASTY;  Surgeon: Dempsey LULLA Moan, MD;  Location: [facility];  Service: Orthopedics;  Laterality: Right;    Current Outpatient Medications  Medication Sig Dispense Refill   brimonidine (ALPHAGAN) 0.2 % ophthalmic solution Place 1 drop into both eyes 2 (two) times daily.     METROCREAM 0.75 % cream Apply 1 Application topically.     mirtazapine  (REMERON ) 15 MG tablet TAKE 1 TABLET BY MOUTH EVERYDAY AT BEDTIME 90 tablet 1   polyethylene glycol (MIRALAX  / GLYCOLAX ) 17 g packet Take 17 g by mouth daily. 14 each 0   rosuvastatin  (CRESTOR ) 10  MG tablet TAKE 1 TABLET BY MOUTH EVERY DAY 90 tablet 3   senna (SENOKOT) 8.6 MG TABS tablet Take 1 tablet by mouth.     rivastigmine  (EXELON ) 3 MG capsule Take 1 capsule (3 mg total) by mouth 2 (two) times daily. 180 capsule 4   No current facility-administered medications for this visit.    Allergies as of 02/22/2023 - Review Complete 02/22/2023  Allergen Reaction Noted   Lisinopril  Other (See Comments) and Cough 11/09/2021  Mold extract [trichophyton] Other (See Comments) 05/07/2016   Molds & smuts Other (See Comments) 03/10/2020   Sulfa antibiotics  04/06/2022   Codeine Nausea Only 07/11/2012   Oxycodone  Nausea Only 06/18/2012    Vitals: BP (!) 134/93   Pulse (!) 117   Ht 5' 3 (1.6 m)   Wt 105 lb (47.6 kg)   LMP  (LMP Unknown)   BMI 18.60 kg/m  Last Weight:  Wt Readings from Last 1 Encounters:  02/22/23 105 lb (47.6 kg)   Last Height:   Ht Readings from Last 1 Encounters:  02/22/23 5' 3 (1.6 m)   Physical exam: Exam: Gen: slightly agitated, conversant, well nourised, obese, well groomed                     CV: RRR, no MRG. No Carotid Bruits. No peripheral edema, warm, nontender Eyes: Conjunctivae clear without exudates or hemorrhage  Neuro: Detailed Neurologic Exam  Speech:    Speech is normal; fluent and spontaneous with normal comprehension.  Cognition: MMSE 07/2015 30/30 MMSE 04/2016 25     02/22/2023   10:38 AM 10/01/2022    1:11 PM 08/02/2015   12:57 PM  MMSE - Mini Mental State Exam  Orientation to time 4 5 5   Orientation to Place 5 5 5   Registration 3 3 3   Attention/ Calculation 0 5 5  Recall 3 3 3   Language- name 2 objects 2 2 2   Language- repeat 1 1 1   Language- follow 3 step command 3 3 3   Language- read & follow direction 1 1 1   Write a sentence 1 1 1   Copy design 1 1 1   Total score 24 30 30       02/22/2023   10:43 AM 05/07/2016   10:56 AM  Montreal Cognitive Assessment   Visuospatial/ Executive (0/5) 0 5  Naming (0/3) 3 3   Attention: Read list of digits (0/2) 1 2  Attention: Read list of letters (0/1) 1 1  Attention: Serial 7 subtraction starting at 100 (0/3) 0 3  Language: Repeat phrase (0/2) 0 2  Language : Fluency (0/1) 1 1  Abstraction (0/2) 1 1  Delayed Recall (0/5) 3 2  Orientation (0/6) 5 5  Total 15 25  Adjusted Score (based on education)  25    Cranial Nerves: Hypomimia    The pupils are equal, round, and reactive to light. Attempted, pupils too smal to visualize fundo. Visual fields are full to finger confrontation. Extraocular movements are intact. Trigeminal sensation is intact and the muscles of mastication are normal. The face is symmetric. The palate elevates in the midline. Hearing intact. Voice is normal. Shoulder shrug is normal. The tongue has normal motion without fasciculations.   Coordination:    Normal finger to nose and heel to shin. Normal rapid alternating movements.   Gait: Narrow gait, low clearance, slightly stooped, enblock turning  Motor Observation:    No asymmetry, no atrophy, and no involuntary movements noted. No tremor noted Tone: Right > left cogwheeling Increased tone on the right  Posture: Slightly stooped    Strength:    Strength is V/V in the upper and lower limbs.      Sensation: intact to LT     Reflex Exam:  DTR's:    Deep tendon reflexes in the upper and lower extremities are brisk bilaterally.   Toes:    The toes are equiv bilaterally.   Clonus:    Clonus is absent.  Assessment/Plan:  This is an extremely lovely 89 year-old patient Father had Alzheimer's in his 85s. MMSE now 24/30 and Moca 15/30 cognitive and physical decline with parkinsonism. Physical exam show: Hypomimia, Right > left UE cogwheeling, Increased tone on the right, Brisk reflexes, Narrow gait, low clearance, slightly stooped, enblock turming  Memory loss: Has had formal neurocognitive testing in the past, she declines repeat.  MMSE now 24/30 and Moca 15/30 cognitive and  physical decline with parkinsonism. We have tried aricept  on the past with side effects, she is on Rivastigmine  but have tried to increase in the past with side effects. Can consider Namenda next but needs thorough evaluation: for her parkinsonism possibly akinetic-rigid subtype because there is no tremor  Parkinsonism possibly akinetic-rigid subtype because there is no tremor Per meta AI which I agree with: Akinetic-rigid (AR) Parkinson's disease is a motor subtype of Parkinson's disease (PD) characterized by rigidity, bradykinesia (slow movements), and akinesia (difficulty initiating movement). AR is one of two main subtypes of PD, the other being tremor-dominant (TD).   We need DAT Scan to asses for parkinson's disease. MRi brain for reversible causes of parkinsonism and dementia   HIGHLY encouraged her to consider moving into assisted living as she has had falls in her home and her husband has dementia and multiple medical conditions needing addition support as well. Discussed with daughter prior to visit, we discussed this at length with patient and her family here today who provided much information and input as well  RLS tried neupro  patch and try gabapentin  in the past. Consider. TOMAC.   Driving: Advised her not to drive until she has evaluation:  Performance across neurocognitive testing is not a strong predictor of an individual's safety operating a motor vehicle. Should her family wish to pursue a formalized driving evaluation, they could reach out to the following agencies:   [facility] in [place]: [phone]   [facility]: [phone]   [facility]: [phone]   [facility]: [phone] or [phone]   Hx of temporal arteritis, monitor with labs, has been stable, repeated crp/sed normal  Migraines stable  Orders Placed This Encounter  Procedures   MR BRAIN W WO CONTRAST   NM BRAIN DATSCAN  TUMOR LOC INFLAM SPECT 1  DAY   Meds ordered this encounter  Medications   rivastigmine  (EXELON ) 3 MG capsule    Sig: Take 1 capsule (3 mg total) by mouth 2 (two) times daily.    Dispense:  180 capsule    Refill:  4      Cc: Dr. Delice Onetha Epp, MD  [facility] [address] [place], [place] [address]  Phone [phone] Fax [phone]  I spent over 85 minutes of face-to-face and non-face-to-face time with patient on the  1. Cognitive decline and possible dementia of unknown etiology, evaluation in progress   2. Major neurocognitive disorder (HCC)   3. Falls frequently   4. Cognitive decline   5. Parkinsonism, unspecified Parkinsonism type (HCC)   6. Gait abnormality    diagnosis.  This included previsit chart review, lab review, study review, order entry, electronic health record documentation, patient education on the different diagnostic and therapeutic options, counseling and coordination of care, risks and benefits of management, compliance, or risk factor reduction  A total of 25 minutes was spent face-to-face with this patient. Over half this time was spent on counseling patient on the  1. Cognitive decline and possible dementia of unknown etiology, evaluation in  progress   2. Major neurocognitive disorder (HCC)   3. Falls frequently   4. Cognitive decline   5. Parkinsonism, unspecified Parkinsonism type (HCC)   6. Gait abnormality     diagnosis and different diagnostic and therapeutic options available.

## 2023-02-22 NOTE — Patient Instructions (Addendum)
 MRI brain w/wo contrast  DAt Scan for parkinsonian symptoms  [employer] system (daughter) RLS  Performance across neurocognitive testing is not a strong predictor of an individual's safety operating a motor vehicle. Should her family wish to pursue a formalized driving evaluation, they could reach out to the following agencies:   [employer] in [place]: [phone]   [facility]: [phone]   [facility]: [phone]   [facility]: [phone] or [phone]    Brain DaTscan  How to prepare and what to expect What is a brain DaTscan ? A brain DaTscan  is a nuclear medicine scan. It uses radioactive material to diagnose some diseases of the brain, especially those that cause tremor (shakiness). DaTscan  is a brand name for a drug called ioflupane I -123. A brain DaTscan  is a form of radiology, because radiation is used to take pictures of the body. This radioactive drug is ordered especially for you. Because of this, we need at least 72 hours' notice if you must cancel or reschedule your scan.   How does the scan work? You will be given a small dose of tracer (radioactive material) through an intravenous (IV) line. This tracer will collect in part of your brain and give off gamma rays. A special camera called a gamma camera will use these rays to produce pictures and measurements of your brain. How do I prepare? Some drugs will affect the results of your brain DaTscan . You will need to stop taking these drugs before your scan. The table on page 2 lists the drugs that need to be stopped, and for how many days before your scan. This list is in alphabetical order by the generic name of the drug. The common brand names are listed beneath the generic name. Please confirm these instructions with your doctor who prescribed the drug. Drugs to Stop Taking Before your scan, stop taking these medicines for the length of time shown: Name of Drug Stop  Taking  Amoxapine 4 days before  Benztropine  Cogentin 3 days before  Bupropion (Aplenzin, Budeprion, Voxra, Wellbutrin, Zyban) 48 hours before  Buspirone 15 hours before  Citalopram 24 hours before  Cocaine 6 hours before  Escitalopram 24 hours before  Methamphetamine 24 hours before  Methylphenidate (Concerta, Metadate, Methylin, Ritalin) 20 hours before  Paroxetine 24 hours before  Selegilene 48 hours before  Sertraline 3 days before  If you are breastfeeding, or if there is any chance you are pregnant, please tell the scheduler or technologist (the person who will help you prepare for your scan). How is the scan done? When you first arrive, we will ask you to drink a small cup of water  with potassium iodine  in it. This water  may have a metallic taste.  An hour after you drink the potassium iodine  water , the technologist will inject a small amount of tracer into a vein in your arm or hand through your IV.  You must stay in the department for 30 minutes after the injection.  You will then have a break for 3 hours. It is OK to eat and drink during this break.  You must return to the clinic after this 3-hour break to have images of your brain taken.  Then, 4 hours after you receive your tracer injection, the technologist will take images of your brain with the gamma camera. You will lie flat on the exam table while these images are being taken.  You must not move while the camera is taking pictures. If you move, the  pictures will be blurry and may have to be taken again.  Taking the images will take 40 to 45 minutes. Your total time in the imaging room will be about 1 hour.  You may also have a low-dose CT scan of your brain to help confirm any results. A CT scan is another way to take images inside your body.  It will take about 5 hours from the time you drink the potassium iodine  water  until the scans are complete. What will I feel during the scan? The technologist will help make you as  comfortable as possible on the exam table for the scan.  You may feel some minor discomfort from the IV.  Lying still on the exam table may be hard for some patients.  The camera will be close to your head. This may make you feel confined or uneasy (claustrophobic). Please tell the doctor who referred you for this scan if you know you are claustrophobic. Are there any side effects from the scan? Most of the radioactivity from the tracer will pass out of your body in your urine or stool. The rest simply goes away over time.  Bad reactions to this scan are very rare. Fewer than 1% of patients (fewer than 1 out of 100) have a bad reaction. Reactions may include headache, nausea, vertigo (dizziness), or dry mouth. How do I get the results? When the test is over, the nuclear medicine doctor will review your images, prepare a written report, and talk with your doctor about the results. Your doctor will then talk with you about the results and your treatment options. If you needed to stop taking any medicines on the day of your scan, ask your doctor when to start taking them again.  The potentially interfering drugs consist of: amoxapine, amphetamine, benztropine, bupropion, buspirone, citalopram, cocaine, mazindol, methamphetamine, methylphenidate, norephedrine, phentermine, escitalopram,  phenylpropanolamine, selegiline, paroxetine, and sertraline    Parkinson's Disease Parkinson's disease causes problems with movement. It makes it harder for you to walk or control your body. Each person with the disease is affected differently. Treatments can help you manage your symptoms. Parkinson's disease can range from mild to very bad, but it gets worse over time. This often happens slowly over many years. What are the causes? Parkinson's disease is caused by a loss of brain cells called neurons. These brain cells make a substance called dopamine, which is needed to control body movement. It's not known what  causes the brain cells to die. What increases the risk? Being female. Being 75 years of age or older. Having a family history of Parkinson's disease. Having an injury to the brain in the past. Being around things that are harmful or poisonous, such as pesticides. Having depression. This is when you feel sad or hopeless. What are the signs or symptoms? Symptoms can vary and get worse over time. The main symptoms can be seen in your movement. These include: Shaking or tremors that you can't control. This happens while you're resting. Stiffness in your arms and legs. Losing facial expressions. Walking in a way that isn't normal. You may walk with short, shuffling steps. Loss of balance when standing. You may sway, fall backward, or have trouble making turns. Other symptoms include: Being very sad, worried, or nervous. Having delusions. These are strong beliefs that aren't true. Having hallucinations. This is when you see, hear, taste, smell, or feel things that aren't real. Trouble speaking or swallowing. Trouble pooping (constipation). Needing to pee (urinate) right away,  peeing often, or losing control of when you pee or poop. Sleep problems. How is this diagnosed? A diagnosis may be made based on symptoms, your medical history, and a physical exam. You may also have imaging tests that make pictures of your brain. How is this treated? There is no cure for Parkinson's disease. The goal of treatment is to manage your symptoms. It may include: Medicines. Therapy to help with talking or movement. Surgery to reduce shaking and other movements that you can't control. Follow these instructions at home: Medicines Take your medicines only as told by your health care provider. Avoid taking pain or sleeping medicines. These can affect your thinking. Activity Ask your provider if it's safe for you to drive. Exercise as told by your provider or physical therapist. Lifestyle  Put grab bars and  railings in your home, especially near the toilet and in the shower. These help prevent falls. Do not smoke, vape, or use products with nicotine or tobacco in them. If you need help quitting, talk with your provider. Do not drink alcohol. General instructions Talk with your provider to find out what kind of help you need at home. Ask about ways to stay safe. Join a support group for people with Parkinson's disease. Where to find more information [employer] Stroke: basicfm.no Parkinson's Foundation: parkinson.org Contact a health care provider if: Medicines don't help your symptoms. You have a lot of side effects from your medicines. You keep losing your balance or you fall. You need more help at home. You have trouble swallowing. You have a very hard time pooping. You feel very sad, worried, or confused. You see, hear, taste, smell, or feel things that aren't real. Get help right away if: You were hurt in a fall. You can't swallow without choking. You have chest pain or trouble breathing. You don't feel safe at home. These symptoms may be an emergency. Call 911 right away. Do not wait to see if the symptoms will go away. Do not drive yourself to the hospital. Also, get help right away if: You feel like you may hurt yourself or others. You have thoughts about taking your own life. Take one of these steps: Go to your nearest emergency room. Call 911. Call the National Suicide Prevention Lifeline at 217-231-8375 or 988. Text the Crisis Text Line at 563-332-5588. This information is not intended to replace advice given to you by your health care provider. Make sure you discuss any questions you have with your health care provider. Document Revised: 04/16/2022 Document Reviewed: 04/16/2022 Elsevier Patient Education  2024 [employer].

## 2023-02-25 ENCOUNTER — Telehealth: Payer: Self-pay | Admitting: Neurology

## 2023-02-25 MED ORDER — RIVASTIGMINE TARTRATE 3 MG PO CAPS: 3.0000 mg | ORAL_CAPSULE | Freq: Two times a day (BID) | ORAL | 4 refills | Status: AC

## 2023-02-25 NOTE — Telephone Encounter (Signed)
 Dat scan [facility] medicare Berkley Harvey: [id number] exp. 04/11/23 sent to [facility]. [phone]  MRI brain no auth required sent to GI [phone]

## 2023-03-07 ENCOUNTER — Encounter (HOSPITAL_COMMUNITY)
Admission: RE | Admit: 2023-03-07 | Discharge: 2023-03-07 | Disposition: A | Discharge status: Home / Self Care | Payer: Medicare Other | Source: Ambulatory Visit | Attending: Neurology | Admitting: Neurology

## 2023-03-07 DIAGNOSIS — F0390 Unspecified dementia without behavioral disturbance: Secondary | ICD-10-CM | POA: Insufficient documentation

## 2023-03-07 DIAGNOSIS — G20C Parkinsonism, unspecified: Secondary | ICD-10-CM | POA: Insufficient documentation

## 2023-03-07 DIAGNOSIS — R296 Repeated falls: Secondary | ICD-10-CM | POA: Insufficient documentation

## 2023-03-07 DIAGNOSIS — R4189 Other symptoms and signs involving cognitive functions and awareness: Secondary | ICD-10-CM | POA: Insufficient documentation

## 2023-03-07 DIAGNOSIS — Z043 Encounter for examination and observation following other accident: Secondary | ICD-10-CM | POA: Diagnosis not present

## 2023-03-07 DIAGNOSIS — R269 Unspecified abnormalities of gait and mobility: Secondary | ICD-10-CM | POA: Insufficient documentation

## 2023-03-07 MED ORDER — IOFLUPANE I 123 185 MBQ/2.5ML IV SOLN
4.3000 | Freq: Once | INTRAVENOUS | Status: AC | PRN
  Administered 2023-03-07: 4.3 via INTRAVENOUS

## 2023-03-07 MED ORDER — POTASSIUM IODIDE (ANTIDOTE) 130 MG PO TABS
ORAL_TABLET | ORAL | Status: AC
  Filled 2023-03-07: qty 1

## 2023-03-12 ENCOUNTER — Ambulatory Visit
Admission: RE | Admit: 2023-03-12 | Discharge: 2023-03-12 | Disposition: A | Discharge status: Home / Self Care | Payer: Medicare Other | Source: Ambulatory Visit | Attending: Neurology | Admitting: Neurology

## 2023-03-12 ENCOUNTER — Encounter: Payer: Self-pay | Admitting: Neurology

## 2023-03-12 DIAGNOSIS — R296 Repeated falls: Secondary | ICD-10-CM | POA: Diagnosis not present

## 2023-03-12 DIAGNOSIS — G319 Degenerative disease of nervous system, unspecified: Secondary | ICD-10-CM | POA: Diagnosis not present

## 2023-03-12 DIAGNOSIS — G20C Parkinsonism, unspecified: Secondary | ICD-10-CM

## 2023-03-12 DIAGNOSIS — F0390 Unspecified dementia without behavioral disturbance: Secondary | ICD-10-CM

## 2023-03-12 DIAGNOSIS — I6782 Cerebral ischemia: Secondary | ICD-10-CM | POA: Diagnosis not present

## 2023-03-12 DIAGNOSIS — R4189 Other symptoms and signs involving cognitive functions and awareness: Secondary | ICD-10-CM

## 2023-03-12 DIAGNOSIS — R269 Unspecified abnormalities of gait and mobility: Secondary | ICD-10-CM

## 2023-03-12 MED ORDER — GADOPICLENOL 0.5 MMOL/ML IV SOLN
5.0000 mL | Freq: Once | INTRAVENOUS | Status: AC | PRN
  Administered 2023-03-12: 5 mL via INTRAVENOUS

## 2023-03-15 DIAGNOSIS — R413 Other amnesia: Secondary | ICD-10-CM | POA: Diagnosis not present

## 2023-03-19 DIAGNOSIS — R531 Weakness: Secondary | ICD-10-CM | POA: Diagnosis not present

## 2023-03-19 DIAGNOSIS — R278 Other lack of coordination: Secondary | ICD-10-CM | POA: Diagnosis not present

## 2023-03-19 DIAGNOSIS — Z9181 History of falling: Secondary | ICD-10-CM | POA: Diagnosis not present

## 2023-03-19 DIAGNOSIS — G3184 Mild cognitive impairment, so stated: Secondary | ICD-10-CM | POA: Diagnosis not present

## 2023-03-19 NOTE — Telephone Encounter (Signed)
Spoke with daughter, Misty Stanley, and got pt scheduled for 3/31 at 1130 am arrival 59.

## 2023-03-20 DIAGNOSIS — R413 Other amnesia: Secondary | ICD-10-CM | POA: Diagnosis not present

## 2023-03-22 DIAGNOSIS — R413 Other amnesia: Secondary | ICD-10-CM | POA: Diagnosis not present

## 2023-03-25 DIAGNOSIS — R413 Other amnesia: Secondary | ICD-10-CM | POA: Diagnosis not present

## 2023-03-26 ENCOUNTER — Other Ambulatory Visit: Payer: Self-pay | Admitting: Internal Medicine

## 2023-03-29 DIAGNOSIS — R413 Other amnesia: Secondary | ICD-10-CM | POA: Diagnosis not present

## 2023-04-01 DIAGNOSIS — R413 Other amnesia: Secondary | ICD-10-CM | POA: Diagnosis not present

## 2023-04-05 DIAGNOSIS — R413 Other amnesia: Secondary | ICD-10-CM | POA: Diagnosis not present

## 2023-04-11 DIAGNOSIS — H6123 Impacted cerumen, bilateral: Secondary | ICD-10-CM | POA: Diagnosis not present

## 2023-04-11 DIAGNOSIS — Z681 Body mass index (BMI) 19 or less, adult: Secondary | ICD-10-CM | POA: Diagnosis not present

## 2023-04-12 DIAGNOSIS — R413 Other amnesia: Secondary | ICD-10-CM | POA: Diagnosis not present

## 2023-04-16 DIAGNOSIS — H6121 Impacted cerumen, right ear: Secondary | ICD-10-CM | POA: Diagnosis not present

## 2023-04-16 DIAGNOSIS — Z681 Body mass index (BMI) 19 or less, adult: Secondary | ICD-10-CM | POA: Diagnosis not present

## 2023-04-22 DIAGNOSIS — R413 Other amnesia: Secondary | ICD-10-CM | POA: Diagnosis not present

## 2023-05-13 ENCOUNTER — Encounter: Payer: Self-pay | Admitting: Neurology

## 2023-05-13 ENCOUNTER — Ambulatory Visit: Payer: Medicare Other | Admitting: Neurology

## 2023-05-13 VITALS — BP 137/77 | HR 82 | Ht 62.0 in | Wt 107.6 lb

## 2023-05-13 DIAGNOSIS — R269 Unspecified abnormalities of gait and mobility: Secondary | ICD-10-CM | POA: Diagnosis not present

## 2023-05-13 DIAGNOSIS — G20A1 Parkinson's disease without dyskinesia, without mention of fluctuations: Secondary | ICD-10-CM

## 2023-05-13 DIAGNOSIS — R471 Dysarthria and anarthria: Secondary | ICD-10-CM | POA: Diagnosis not present

## 2023-05-13 DIAGNOSIS — R498 Other voice and resonance disorders: Secondary | ICD-10-CM | POA: Diagnosis not present

## 2023-05-13 DIAGNOSIS — W19XXXA Unspecified fall, initial encounter: Secondary | ICD-10-CM

## 2023-05-13 MED ORDER — CARBIDOPA-LEVODOPA 25-100 MG PO TABS: 0.5000 | ORAL_TABLET | Freq: Two times a day (BID) | ORAL | 2 refills | Status: AC

## 2023-05-13 NOTE — Progress Notes (Signed)
 [facility]    Provider:  Dr Lucia Gaskins Referring Provider: Mahlon Gammon, MD Primary Care Physician:  Mahlon Gammon, MD   CC:  Memory changes, word-finding difficult, behavior, motor symptoms  05/13/2023: DAT scan positive for Parkinson's Disorder likely idiopathic Parkinson's disease. Her speech pattern is messy, having problems speaking. She was evaluated for speech at [facility]. She is involved in Box[employer]. Discussed exercise is the only thing known to slow down progression of parkinson's disease. She is doing that 2x a week. Her husband went to memory care and he is at rehab and then going to skilled care. Wife is going to assisted living at [facility]. She fell she tripped over a chandelier overnight. She was going to bed. No freezing episodes, shuffling a little bit more, Drinks out of a straw. She had an experience with a slider, she took a slice and she couldn;t swallow, got lodged at the back of her throat, has to drink out of a straw. She declines a swallow study, states she is being more careful and chews thoroughly. [facility] has a dermatologist, discussed PT at [facility].   03/07/2023: FINDINGS: There is decreased radiotracer activity within the LEFT putamen. Decreased radiotracer activity in the head of the LEFT caudate nucleus. Mild decreased radiotracer activity in the RIGHT putamen.   IMPRESSION: Bilateral reduced radiotracer activity within the striata. Greater loss of activity in the RIGHT striatum. Pattern is typical of Parkinsonian syndrome pathology.   Of note, DaTSCAN is not diagnostic of Parkinsonian syndromes, which remains a clinical diagnosis. DaTscan is an adjuvant test to aid in the clinical diagnosis of Parkinsonian syndromes.   MRI brain 03/12/2023:     IMPRESSION:    MRI brain (with and without) demonstrating: - Moderate perisylvian atrophy and mild chronic small vessel ischemic disease. - No acute findings.  Patient complains of  symptoms per HPI as well as the following symptoms: none . Pertinent negatives and positives per HPI. All others negative   02/22/2023: Discussed moving to assisted living with her husband Elesa Hacker to ENT and has raspy voice and ENT mentioned parkinson's disease' Losing weight , has depression her husband is very ill, she feels her social life has decreased, husband having hallucinations and agitation Extended visit with patient's daughter and patient's sister in law who are ver concerned for patient  Specific Symptoms:   Tremor: no tremors.   Voice: voice is softer Sleep: trouble staying asleep              Vivid Dreams:  Yes.               Acting out dreams:  No. Wet Pillows: having a hard time with secretions and wet pillos Postural symptoms:  no             Falls? she has fallen, she doesn't know why Bradykinesia symptoms: daughter and sister in law state shuffling and walking slow Loss of smell:  Yes.   Loss of taste:  Yes.   Urinary Incontinence:  Yes. Difficulty Swallowing:  she coughs a lot. She has stairs  Handwriting, micrographia  Trouble with ADL's:  Yes.   (able to do it but slower than in the past)             Trouble buttoning clothing: Yes.   Depression:  Yes.   Memory changes:  Yes.     Hallucinations:  No.             visual distortions:  No. N/V:  constipation Lightheaded:  just a little bit when standing             Syncope: no  Patient complains of symptoms per HPI as well as the following symptoms: occ headaches but last esr/crp normal, prior temporal arteritis . Pertinent negatives and positives per HPI. All others negative  Interval history 12/16/2017: She is improved. And formal neurocognitive testing did not show progression, in fact improvement. Here with daughter who provides information. The last testing was improved, normal as far as cognition. At night with nsomnia she thinks of movie stars and movies. She is doing well on the neupro patch forRLS.  She gets cramps at night. Will try gabapentin for RLS and also for her cramping. She feels has difficulty with expression, she stammers, recommended speech therapy.   07/2017: Significant decline, agitation and frustration, changing personality, memory loss. She is having difficulty with words, she forgets words, she can't get it out, she sometimes has to describe the item she is thinking about. They are going swimming, loves [facility]. They do water aerobics.  Husband has had surgery andis getting over it. She is more active than they have been in the past. Husband is her Armed forces logistics/support/administrative officer. Having trouble with words, she is trying to jot down words in a book and study them. She loses things or forget them somewhere. She may place her wallet in the garage and then forget it is there and she finds it. She left her phone in the garden. Short term is worsening. She doesn't remember appointments. She forgot about her blood tests. Still cooking and backing but very slow with directions, takes long to do complicated tasks she has to think more. Still driving. No accidents. She forgot how to put gas in the car, her husband had to show her.   Interval history 12/11/2016: Patient returns today with her daughter and husband to discuss neurocognitive testing which revealed mild cognitive impairment. I discussed that given her family history she is more at risk to progress to Alzheimer's disease. A portion of people with mild cognitive impairment do proceed to dementia. At this point I recommend trying to increase Exelon. Discussed clinical trials and patient is not interested. At a long discussion about mild cognitive impairment, things that we can do to further delineate such as FDG PET scans, other specialized scanning, research opportunities. Husband and daughter do feel as though she is more irritable and her short-term memory is worsening.   Interval history 05/07/2016: Kerah Hardebeck Douty is a 89 y.o. female here  as a referral from Dr. Kevan Ny for memory problems. She has a past medical history of arthritis, glaucoma, temporal arteritis. She is here with daughter and husband. Started on Aricept at last appointment. She returns with worsening memory complaints. Father had Alzheimers in his 74s.  She has been taking Aricept 5 mg for 6 months, half of the 10 mg tab as the 10 mg tablet caused side effects. Patient's memory continues to decline. Had a discussion with husband and daughter, she is becoming more disoriented and in familiar places, she is losing things more, she is forgetting more appointments, dates and conversations.  Unfortunately patient denies any changes in his quite agitated with her family recently. Today I had to be very gentle when suggesting formal neurocognitive testing, I did tell her that this is something we should do especially in people with family history inordinate to make her upset. Family really wants her to have it done for  baseline and to evaluate her current cognition which I think is a good idea. Also discussed with him our clinical trials [facility] today which I think she would be wonderful for if she agrees to be part of it.   HPI:  SHATON LORE is a 89 y.o. female here as a referral from Dr. Kevan Ny for memory problems. She has a past medical history of arthritis, glaucoma, temporal arteritis. She is here with daughter and husband if there is anything that she started to exhibit problems with her memory. She is having memory lapses, increased frustration especially when this is mentioned to her, some increased episodes of anger, crying episodes. There are no mood issues but possibly she feels defensive when they point out. She doesn't remember.  She is having episodes of memory loss, she forgot the details of her knee surgery. Also such as when she insists on things and then gets angry if she is proven wrong such as details on driving. Started within the last 3 months. She never  had this behavior in the past. Denies any mood changes or depression. Father had Alzheimers. She has lost her sense of smell 15 years ago. She forgets appointments, gets the wrong date, forgets the dates o events, misses appointments. Husband had always done the bills. She tried to send an email and couldn't figure it out. Pain in the back of the head is better. Posey Rea how old her father was when he died, maybe 21, they were closed but he had suffered from dementia for many years without point. Patient reports she has Word-findning issues, gets confused especially with driving, some episodes of confusion. She insisted she had a knee replacement when she didn't. Balance is good but she has had a few falls mostly mechanical, where she trips over something, or when she was gardening and pulled too hard to get a weed up. No other focal neurologic deficits or complaints.   Review of Systems: Patient complains of symptoms per HPI as well as the following symptoms: Blurred vision. No shortness of breath or chest pain. Pertinent negatives per HPI. All others negative.  Social History   Socioeconomic History   Marital status: Married    Spouse name: Greggory Stallion   Number of children: 2   Years of education: college   Highest education level: Not on file  Occupational History   Occupation: retired    Associate Professor: RETIRED  Tobacco Use   Smoking status: Never   Smokeless tobacco: Never  Vaping Use   Vaping status: Never Used  Substance and Sexual Activity   Alcohol use: Yes    Alcohol/week: 1.0 standard drink of alcohol    Types: 1 Glasses of wine per week    Comment: SELDOM, very little   Drug use: No   Sexual activity: Yes  Other Topics Concern   Not on file  Social History Narrative   Pt lives at home with husband.   Caffeine Use: Very little.    Patient is right handed       Social History      Diet? No       Do you drink/eat things with caffeine? Some coffee      Marital status?    Married                                 What year were you married? 1961      Do you  live in a house, apartment, assisted living, condo, trailer, etc.?  [name]       Is it one or more stories? one      How many persons live in your home? two      Do you have any pets in your home? No       Highest level of education completed? 14 years       Current or past profession:  Engineer, petroleum       Do you exercise?              Yes                        Type & how often? Water aerobic and balance class      Advanced Directives      Do you have a living will?  yes      Do you have a DNR form?        yes                          If not, do you want to discuss one?      Do you have signed POA/HPOA for forms? yes      Functional Status      Do you have difficulty bathing or dressing yourself?      Do you have difficulty preparing food or eating?       Do you have difficulty managing your medications?      Do you have difficulty managing your finances?      Do you have difficulty affording your medications?   Social Drivers of Corporate investment banker Strain: Not on file  Food Insecurity: No Food Insecurity (04/02/2022)   Hunger Vital Sign    Worried About Running Out of Food in the Last Year: Never true    Ran Out of Food in the Last Year: Never true  Transportation Needs: No Transportation Needs (04/02/2022)   PRAPARE - Administrator, Civil Service (Medical): No    Lack of Transportation (Non-Medical): No  Physical Activity: Not on file  Stress: Not on file  Social Connections: Not on file  Intimate Partner Violence: Not At Risk (04/02/2022)   Humiliation, Afraid, Rape, and Kick questionnaire    Fear of Current or Ex-Partner: No    Emotionally Abused: No    Physically Abused: No    Sexually Abused: No    Family History  Problem Relation Age of Onset   Heart attack Mother    Alzheimer's disease Father     Past Medical History:  Diagnosis Date   Aortic aneurysm  (HCC)    Arthritis    OA BOTH KNEES AND HANDS   Atrial tachycardia (HCC)    HX OF   GERD (gastroesophageal reflux disease)    Glaucoma    left eye   Goiter    CAUSING COUGH, HOARSINESS AND DRY THROAT   Headache(784.0)    MIGRAINES    Hyperlipidemia    Neuralgia    Overactive bladder    RLS (restless legs syndrome)    Temporal arteritis (HCC)     Past Surgical History:  Procedure Laterality Date   ARTERY BIOPSY Right 07/21/2012   Procedure: BIOPSY TEMPORAL ARTERY;  Surgeon: Currie Paris, MD;  Location: [facility] OR;  Service: General;  Laterality: Right;   CERVICAL FUSION  2012   Dr. Venetia Maxon  EYE SURGERY     CATARACT EXTRACTION- BILATERAL   INCONTINENCE SURGERY     JOINT REPLACEMENT     right knee   LEFT SHOULDER SURGERY     RIGHT KNEE ARTHROSCOPY  11/2009     ROBOTIC ASSISTED BILATERAL SALPINGO OOPHERECTOMY Bilateral 12/08/2013   Procedure: ROBOTIC ASSISTED LAPAROSCOPIC BILATERAL SALPINGO OOPHORECTOMY WITH STAGING;  Surgeon: Adolphus Birchwood, MD;  Location: [facility]S;  Service: Gynecology;  Laterality: Bilateral;   TOTAL HIP ARTHROPLASTY Left 04/03/2022   Procedure: TOTAL HIP ARTHROPLASTY ANTERIOR APPROACH;  Surgeon: Sheral Apley, MD;  Location: [facility];  Service: Orthopedics;  Laterality: Left;   TOTAL KNEE ARTHROPLASTY  07/04/2011   Procedure: TOTAL KNEE ARTHROPLASTY;  Surgeon: Loanne Drilling, MD;  Location: [facility];  Service: Orthopedics;  Laterality: Right;    Current Outpatient Medications  Medication Sig Dispense Refill   brimonidine (ALPHAGAN) 0.2 % ophthalmic solution Place 1 drop into both eyes 2 (two) times daily.     carbidopa-levodopa (SINEMET IR) 25-100 MG tablet Take 0.5 tablets by mouth 2 (two) times daily. -Try to separate Sinemet from food (especially protein-rich foods like meat, dairy, eggs) by about 30-60 mins - this will help the absorption of the medication. If you have some nausea with the medication, you can take it with some light food like crackers or ginger  ale. Take in the morning and then late afternoon. 60 tablet 2   METROCREAM 0.75 % cream Apply 1 Application topically.     mirtazapine (REMERON) 15 MG tablet TAKE 1 TABLET BY MOUTH EVERYDAY AT BEDTIME 90 tablet 1   polyethylene glycol (MIRALAX / GLYCOLAX) 17 g packet Take 17 g by mouth daily. 14 each 0   rivastigmine (EXELON) 3 MG capsule Take 1 capsule (3 mg total) by mouth 2 (two) times daily. 180 capsule 4   rosuvastatin (CRESTOR) 10 MG tablet TAKE 1 TABLET BY MOUTH EVERY DAY 90 tablet 3   No current facility-administered medications for this visit.    Allergies as of 05/13/2023 - Review Complete 05/13/2023  Allergen Reaction Noted   Lisinopril Other (See Comments) and Cough 11/09/2021   Mold extract [trichophyton] Other (See Comments) 05/07/2016   Molds & smuts Other (See Comments) 03/10/2020   Sulfa antibiotics  04/06/2022   Codeine Nausea Only 07/11/2012   Oxycodone Nausea Only 06/18/2012    Vitals: BP 137/77 (BP Location: Right Arm, Cuff Size: Normal)   Pulse 82   Ht 5\' 2"  (1.575 m)   Wt 107 lb 9.6 oz (48.8 kg)   LMP  (LMP Unknown)   BMI 19.68 kg/m  Last Weight:  Wt Readings from Last 1 Encounters:  05/13/23 107 lb 9.6 oz (48.8 kg)   Last Height:   Ht Readings from Last 1 Encounters:  05/13/23 5\' 2"  (1.575 m)   Physical exam: Exam: Gen: NAD, pleasant,well groomed                     CV: RRR, no MRG. No Carotid Bruits. Slight peripheral swelling, CVI? Eyes: Conjunctivae clear without exudates or hemorrhage  Neuro: Detailed Neurologic Exam  Speech:    Speech is hypophonic slightly dysarthric Cognition: MMSE 07/2015 30/30 MMSE 04/2016 25     02/22/2023   10:38 AM 10/01/2022    1:11 PM 08/02/2015   12:57 PM  MMSE - Mini Mental State Exam  Orientation to time 4 5 5   Orientation to Place 5 5 5   Registration 3 3 3   Attention/ Calculation 0 5  5  Recall 3 3 3   Language- name 2 objects 2 2 2   Language- repeat 1 1 1   Language- follow 3 step command 3 3 3    Language- read & follow direction 1 1 1   Write a sentence 1 1 1   Copy design 1 1 1   Total score 24 30 30       02/22/2023   10:43 AM 05/07/2016   10:56 AM  Montreal Cognitive Assessment   Visuospatial/ Executive (0/5) 0 5  Naming (0/3) 3 3  Attention: Read list of digits (0/2) 1 2  Attention: Read list of letters (0/1) 1 1  Attention: Serial 7 subtraction starting at 100 (0/3) 0 3  Language: Repeat phrase (0/2) 0 2  Language : Fluency (0/1) 1 1  Abstraction (0/2) 1 1  Delayed Recall (0/5) 3 2  Orientation (0/6) 5 5  Total 15 25  Adjusted Score (based on education)  25    Cranial Nerves: Hypomimia    The pupils are equal, round, and reactive to light. Attempted, pupils too smal to visualize fundo. Visual fields are full to threat. Extraocular movements are intact. Trigeminal sensation is intact and the muscles of mastication are normal. The face is symmetric. The palate elevates in the midline. Hearing intact. hypophonic. The tongue has normal motion without fasciculations.   Coordination: Bradykinesia, decreased fine motor movement  Gait: Narrow gait, low clearance, stooped, enblock turning, shuffling, bradykinesia  Motor Observation:    No asymmetry, no atrophy, and no involuntary movements noted. No tremor noted Tone: Right > left cogwheeling Increased tone on the right  Posture: Slightly stooped    Strength:    Strength is V/V in the upper and lower limbs.      Sensation: intact to LT     Reflex Exam:  DTR's:    Deep tendon reflexes in the upper and lower extremities are brisk bilaterally.   Toes:    The toes are equiv bilaterally.   Clonus:    Clonus is absent.     Assessment/Plan:  This is an extremely lovely 65 year-old patient Father had Alzheimer's in his 13s. Last MMSE in January 24/30 and Moca Derl Barrow  15/30 cognitive and physical decline with parkinsonism. Physical exam show: Hypomimia, Right > left UE cogwheeling, Increased tone on the right, Brisk  reflexes, Narrow gait, low clearance, slightly stooped, enblock turming. DAT scan positive for parkinson's disorder likely idiopathic parkinson;s disease   Skin Checks routinely, skin cancer increased in PD Speech Therapy: order at [facility] Consider a swallowing study or OT for fine motor decreased at this time wants to hold off Sinemet 1/2 tablet twice daily and can slowly increase  Swelling; Chronic Venous Insufficiency? Discuss with primary care  Memory loss: Has had formal neurocognitive testing in the past, she declines repeat.  MMSE now 24/30 and Moca 15/30 cognitive and physical decline with parkinsonism. We have tried aricept on the past with side effects, she is on Rivastigmine but have tried to increase in the past with side effects.   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 -Try to separate Sinemet from food (especially protein-rich foods like meat, dairy, eggs) by about 30-60 mins -  this will help the absorption of the medication. If you have some nausea with the medication, you can take it with some light food like crackers or ginger ale. Take in the morning and then late afternoon.    Dispense:  60 tablet    Refill:  2     Per meta AI which I agree with: Akinetic-rigid (AR) Parkinson's disease is a motor subtype of Parkinson's disease (PD) characterized by rigidity, bradykinesia (slow movements), and akinesia (difficulty initiating movement). AR is one of two main subtypes of PD, the other being tremor-dominant (TD).    She is moving into Assisted Living: last appointment HIGHLY encouraged her to consider moving into assisted living as she has had  falls in her home and her husband has dementia and multiple medical conditions needing addition support as well. Discussed with daughter prior to visit, we discussed this at length with patient and her family here today who provided much information and input as well. She is moving into Assisted living.   RLS tried neupro patch and try gabapentin in the past. Consider. TOMAC.   Driving: Advised her not to drive until she has evaluation:  Performance across neurocognitive testing is not a strong predictor of an individual's safety operating a motor vehicle. Should her family wish to pursue a formalized driving evaluation, they could reach out to the following agencies:   [facility] [employer] [facility] in [place]: [phone]   [facility]: [phone]   [facility]: [phone]   [facility]: [phone] or [phone]   Hx of temporal arteritis, monitor with labs, has been stable, repeated crp/sed normal  Migraines stable  Orders Placed This Encounter  Procedures   Ambulatory referral to Speech Therapy   Ambulatory referral to Physical Therapy   Meds ordered this encounter  Medications   carbidopa-levodopa (SINEMET IR) 25-100 MG tablet    Sig: Take 0.5 tablets by mouth 2 (two) times daily. -Try to separate Sinemet from food (especially protein-rich foods like meat, dairy, eggs) by about 30-60 mins - this will help the absorption of the medication. If you have some nausea with the medication, you can take it with some light food like crackers or ginger ale. Take in the morning and then late afternoon.    Dispense:  60 tablet    Refill:  2      Cc: Dr. Marvene Staff, MD  [facility] [address] [place], [place] [address]  Phone [phone] Fax [phone]  I spent 45 minutes of face-to-face and non-face-to-face time with patient on the  1. Parkinson disease, symptomatic (HCC)   2. Dysarthria    3. Hypophonia   4. Gait disorder   5. Fall, initial encounter    diagnosis.  This included previsit chart review, lab review, study review, order entry, electronic health record documentation, patient education on the different diagnostic and therapeutic options, counseling and coordination of care, risks and benefits of management, compliance, or risk factor reduction

## 2023-05-13 NOTE — Patient Instructions (Addendum)
 Skin Checks routinely Speech Therapy: order at [facility] Physical Therapy at [facility] Consider a swallowing study or OT for fine motor decreased at this time wants to hold off Sinemet 1/2 tablet twice daily and can slowly increase  Swelling; Chronic Venous Insufficiency? Discuss with primary care  Carbidopa; Levodopa Tablets What is this medication? CARBIDOPA; LEVODOPA (kar bi DOE pa; lee voe DOE pa) treats the symptoms of Parkinson disease. It works by increasing the amount of dopamine in your brain, a substance which helps manage body movements and coordination. This reduces the symptoms of Parkinson, such as body stiffness and tremors. This medicine may be used for other purposes; ask your health care provider or pharmacist if you have questions. COMMON BRAND NAME(S): Atamet, Dhivy, SINEMET What should I tell my care team before I take this medication? They need to know if you have any of these conditions: Depression or other mental health conditions Diabetes Glaucoma Heart disease, including history of a heart attack History of irregular heartbeat Kidney disease Liver disease Lung or breathing disease, such as asthma Narcolepsy Sleep apnea Stomach or intestine problems An unusual or allergic reaction to levodopa, carbidopa, other medications, foods, dyes, or preservatives Pregnant or trying to get pregnant Breastfeeding How should I use this medication? Take this medication by mouth with a glass of water. Follow the directions on the prescription label. Take your doses at regular intervals. Do not take your medication more often than directed. Do not stop taking except on the advice of your care team. One form of this medication is a breakable tablet. If your care team has prescribed this, this medication comes with INSTRUCTIONS FOR USE. Ask your pharmacist for directions on how to use this medicine. Read the information carefully. Talk to your pharmacist or care team if you  have questions. Talk to your care team about the use of this medication in children. Special care may be needed. Overdosage: If you think you have taken too much of this medicine contact a poison control center or emergency room at once. NOTE: This medicine is only for you. Do not share this medicine with others. What if I miss a dose? If you miss a dose, take it as soon as you can. If it is almost time for your next dose, take only that dose. Do not take double or extra doses. What may interact with this medication? Do not take this medication with any of the following: MAOIs, such as Marplan, Nardil, and Parnate This medication may also interact with the following: Alcohol Benzodiazepines, such as alprazolam, diazepam, lorazepam Certain antihistamines Certain medications for mental health conditions, such as amitriptyline, haloperidol, risperidone, trazodone Droperidol Iron supplements or multivitamins with iron Isoniazid Medications that cause drowsiness before a procedure, such as propofol Medications that help you fall asleep Medications that relax muscles Metoclopramide Opioids for pain or cough Papaverine Phenothiazines, such as chlorpromazine, prochlorperazine, thioridazine Phenytoin Rasagiline Selegiline Tetrabenazine This list may not describe all possible interactions. Give your health care provider a list of all the medicines, herbs, non-prescription drugs, or dietary supplements you use. Also tell them if you smoke, drink alcohol, or use illegal drugs. Some items may interact with your medicine. What should I watch for while using this medication? Visit your care team for regular checks on your progress. Tell your care team if your symptoms do not start to get better or if they get worse. A severe reaction similar to neuroleptic malignant syndrome (NMS) may occur if you reduce the dose of or  stop taking this medication too quickly. Symptoms of NMS include high fever, stiff  muscles, increased sweating, fast or irregular heartbeat, and confusion. Contact your care team right away if think you have NMS. This medication may affect your coordination, reaction time, or judgment. Do not drive or operate machinery until you know how this medication affects you. Sit up or stand slowly to reduce the risk of dizzy or fainting spells. Drinking alcohol with this medication can increase the risk of these side effects. When taking this medication, you may fall asleep without notice. You may be doing activities, such as driving a car, talking, or eating. You may not feel drowsy before it happens. Contact your care team right away if this happens to you. There have been reports of increased sexual urges or other strong urges, such as gambling while taking this medication. If you experience any of these while taking this medication, you should report this to your care team as soon as possible. You may experience a 'wearing off' effect before it is time to take your next dose of this medication. You may also experience an 'on-off' effect where the medication seems to stop working for minutes to hours, then suddenly starts working again. Tell your care team if this happens to you. Your dose may need to be adjusted. Eating high protein foods may affect how this medication works. Tell your care team if you change your diet. If you have diabetes, you may get a false-positive result for sugar in your urine. Check with your care team. This medication can make your saliva, sweat, or urine look dark red or black. This is normal but may stain clothing or fabrics. This medication may cause low levels of vitamin B6 in your body. Make sure that you get enough vitamin B6 while you are taking this medication. Discuss the foods you eat and the vitamins you take with your care team. What side effects may I notice from receiving this medication? Side effects that you should report to your care team as soon as  possible: Allergic reactions--skin rash, itching, hives, swelling of the face, lips, tongue, or throat Falling asleep during daily activities Heart rhythm changes--fast or irregular heartbeat, dizziness, feeling faint or lightheaded, chest pain, trouble breathing Low blood pressure--dizziness, feeling faint or lightheaded, blurry vision Mood and behavior changes--anxiety, nervousness, confusion, hallucinations, irritability, hostility, thoughts of suicide or self-harm, worsening mood, feelings of depression New or worsening uncontrolled and repetitive movements of the face, mouth, or upper body Stomach bleeding--bloody or black, tar-like stools, vomiting blood or brown material that looks like coffee grounds Sudden eye pain or change in vision such as blurry vision, seeing halos around lights, vision loss Urges to engage in impulsive behaviors such as gambling, binge eating, sexual activity, or shopping in ways that are unusual for you Side effects that usually do not require medical attention (report to your care team if they continue or are bothersome): Dark red or black saliva, sweat, or urine Dizziness Drowsiness Headache Nausea This list may not describe all possible side effects. Call your doctor for medical advice about side effects. You may report side effects to FDA at 1-800-FDA-1088. Where should I keep my medication? Keep out of the reach of children. Store at room temperature between 15 and 30 degrees C (59 and 86 degrees F). Protect from light. Throw away any unused medication after the expiration date. NOTE: This sheet is a summary. It may not cover all possible information. If you have questions about  this medicine, talk to your doctor, pharmacist, or health care provider.  2024 Elsevier/Gold Standard (2022-10-12 00:00:00)  Chronic Venous Insufficiency Chronic venous insufficiency is a condition that causes the veins in the legs to struggle to pump blood from the legs to the  heart. It is also called venous stasis. This condition can happen when the vein walls are stretched, weakened, or damaged. It can also happen when the valves inside the vein are damaged. With the right treatment, you should be able to still lead an active life. What are the causes? Common causes of this condition include: Venous hypertension. This is high blood pressure inside the veins. Sitting or standing too long. This can cause increased blood pressure in the veins of the leg. Deep vein thrombosis (DVT). This is a blood clot that blocks blood flow in a vein. Phlebitis. This is inflammation of a vein. It can cause a blood clot to form. An abnormal growth of cells (tumor) in the area between your hip bones (pelvis). This can cause blood to back up. What increases the risk? Factors that may make you more likely to get this condition include: Having a family history of the condition. Being overweight. Being pregnant. Not getting enough exercise. Smoking. Having a job that requires you to sit or stand in one place for a long time. Being a certain age. Females in their 76s and 7s and males in their 70s are more likely to get this condition. What are the signs or symptoms? Symptoms of this condition include: Varicose veins. These are veins that are enlarged, bulging, or twisted. Skin breakdown or ulcers. Reddened skin or dark discoloration of the skin on the leg between the knee and ankle. Lipodermatosclerosis. This is brown, smooth, tight, and painful skin just above the ankle. It is often on the inside of the leg. Swelling of the legs. How is this diagnosed? This condition may be diagnosed based on your medical history and a physical exam. You may also need tests, such as: A duplex ultrasound. This shows how blood flows through a blood vessel. Plethysmography. This tests blood flow. Venogram. This looks at the veins using an X-ray and dye. How is this treated? The goals of treatment  are to help you return to an active life and to relieve pain. Treatment may include: Wearing compression stockings. These do not cure the condition but can help relieve symptoms. They can also help stop your condition from getting worse. Sclerotherapy. This involves injecting a solution to shrink damaged veins. Surgery. This may include: Vein stripping. This is when a diseased vein is taken out. Laser ablation surgery. This is when blood flow is cut off through the vein. Repairing or remaking a valve inside the affected vein. Follow these instructions at home: Lifestyle Do not use any products that contain nicotine or tobacco. These products include cigarettes, chewing tobacco, and vaping devices, such as e-cigarettes. If you need help quitting, ask your health care provider. Stay active. Exercise, walk, or do other activities. Ask your provider what activities are safe for you. General instructions Take over-the-counter and prescription medicines only as told by your provider. Drink enough fluid to keep your pee (urine) pale yellow. Wear compression stockings as told by your provider. These stockings help to prevent blood clots and reduce swelling in your legs. Keep all follow-up visits. Your provider will check your legs for any changes and adjust your treatment plan as needed. Contact a health care provider if: You have redness, swelling, or more  pain in the affected area. You see a red streak or line that goes up or down from the area. You have skin breakdown or skin loss. You get an injury in the affected area. You get a fever. Get help right away if: You have severe pain that does not get better with medicine. You get an injury and an open wound in the affected area. Your foot or ankle becomes numb or weak all of a sudden. You have trouble moving your foot or ankle. Your symptoms do not go away or get worse. You have chest pain. You have shortness of breath. These symptoms may be  an emergency. Get help right away. Call 911. Do not wait to see if the symptoms will go away. Do not drive yourself to the hospital. This information is not intended to replace advice given to you by your health care provider. Make sure you discuss any questions you have with your health care provider. Document Revised: 02/13/2022 Document Reviewed: 02/13/2022 Elsevier Patient Education  2024 [employer] Inc.   Parkinson's Disease Parkinson's disease causes problems with movement. It makes it harder for you to walk or control your body. Each person with the disease is affected differently. Treatments can help you manage your symptoms. Parkinson's disease can range from mild to very bad, but it gets worse over time. This often happens slowly over many years. What are the causes? Parkinson's disease is caused by a loss of brain cells called neurons. These brain cells make a substance called dopamine, which is needed to control body movement. It's not known what causes the brain cells to die. What increases the risk? Being female. Being 3 years of age or older. Having a family history of Parkinson's disease. Having an injury to the brain in the past. Being around things that are harmful or poisonous, such as pesticides. Having depression. This is when you feel sad or hopeless. What are the signs or symptoms? Symptoms can vary and get worse over time. The main symptoms can be seen in your movement. These include: Shaking or tremors that you can't control. This happens while you're resting. Stiffness in your arms and legs. Losing facial expressions. Walking in a way that isn't normal. You may walk with short, shuffling steps. Loss of balance when standing. You may sway, fall backward, or have trouble making turns. Other symptoms include: Being very sad, worried, or nervous. Having delusions. These are strong beliefs that aren't true. Having hallucinations. This is when you see, hear, taste,  smell, or feel things that aren't real. Trouble speaking or swallowing. Trouble pooping (constipation). Needing to pee (urinate) right away, peeing often, or losing control of when you pee or poop. Sleep problems. How is this diagnosed? A diagnosis may be made based on symptoms, your medical history, and a physical exam. You may also have imaging tests that make pictures of your brain. How is this treated? There is no cure for Parkinson's disease. The goal of treatment is to manage your symptoms. It may include: Medicines. Therapy to help with talking or movement. Surgery to reduce shaking and other movements that you can't control. Follow these instructions at home: Medicines Take your medicines only as told by your health care provider. Avoid taking pain or sleeping medicines. These can affect your thinking. Activity Ask your provider if it's safe for you to drive. Exercise as told by your provider or physical therapist. Lifestyle  Put grab bars and railings in your home, especially near the toilet and  in the shower. These help prevent falls. Do not smoke, vape, or use products with nicotine or tobacco in them. If you need help quitting, talk with your provider. Do not drink alcohol. General instructions Talk with your provider to find out what kind of help you need at home. Ask about ways to stay safe. Join a support group for people with Parkinson's disease. Where to find more information [employer] and Stroke: [employer] Parkinson's Foundation: parkinson.org Contact a health care provider if: Medicines don't help your symptoms. You have a lot of side effects from your medicines. You keep losing your balance or you fall. You need more help at home. You have trouble swallowing. You have a very hard time pooping. You feel very sad, worried, or confused. You see, hear, taste, smell, or feel things that aren't real. Get help right away  if: You were hurt in a fall. You can't swallow without choking. You have chest pain or trouble breathing. You don't feel safe at home. These symptoms may be an emergency. Call 911 right away. Do not wait to see if the symptoms will go away. Do not drive yourself to the hospital. Also, get help right away if: You feel like you may hurt yourself or others. You have thoughts about taking your own life. Take one of these steps: Go to your nearest emergency room. Call 911. Call the National Suicide Prevention Lifeline at 9071969141 or 988. Text the Crisis Text Line at 779-571-7509. This information is not intended to replace advice given to you by your health care provider. Make sure you discuss any questions you have with your health care provider. Document Revised: 11/01/2022 Document Reviewed: 04/16/2022 Elsevier Patient Education  2024 [employer].

## 2023-05-22 ENCOUNTER — Encounter: Payer: Self-pay | Admitting: Neurology

## 2023-05-24 ENCOUNTER — Encounter: Payer: Self-pay | Admitting: Orthopedic Surgery

## 2023-05-24 ENCOUNTER — Non-Acute Institutional Stay: Payer: Self-pay | Admitting: Orthopedic Surgery

## 2023-05-24 DIAGNOSIS — L03011 Cellulitis of right finger: Secondary | ICD-10-CM | POA: Diagnosis not present

## 2023-05-24 MED ORDER — DOXYCYCLINE HYCLATE 100 MG PO TABS
100.0000 mg | ORAL_TABLET | Freq: Two times a day (BID) | ORAL | Status: AC
Start: 2023-05-24 — End: 2023-06-07

## 2023-05-24 NOTE — Progress Notes (Signed)
 Location:  [facility] Room Number: 524/A Place of Service:  ALF [phone]) Provider:  Arnetha Bhat, NP   Marguerite Shiley, MD  Patient Care Team: Marguerite Shiley, MD as PCP - General (Internal Medicine) Hugh Madura, MD as PCP - Cardiology (Cardiology) Glory Larsen, MD as Consulting Physician (Neurology) Thelda Finney Lucie Ruts, DO as Consulting Physician (Pulmonary Disease) Zackery Herring, OD (Optometry) Cleda Curly as Physician Assistant (Physician Assistant)  Extended Emergency Contact Information Primary Emergency Contact: Rubie Corona, [place] [address] [place] Mobile Phone: [phone] Relation: Daughter  Code Status:  DNR Goals of care: Advanced Directive information    10/01/2022    1:09 PM  Advanced Directives  Does Patient Have a Medical Advance Directive? Yes  Type of Estate agent of [place];Living will;Out of facility DNR (pink MOST or yellow form)  Does patient want to make changes to medical advance directive? No - Patient declined     Chief Complaint  Patient presents with   Acute Visit    Right thumb swelling and redness    HPI:  Pt is a 89 y.o. female seen today for acute visit due to right thumb swelling and redness.   She currently resides on the assisted living unit at [facility]. PMH: aortic atherosclerosis, coronary artery calcification, atrial tachycardia, dysphagia, GERD, multinodular goiter, cognitive impairment, OA, osteopenia, s/p left hip fracture 03/2022, incontinence, RLS and depression.   She reports right thumb swelling, tenderness and redness x 2 days. No recent fall or injury. No recent insect bites. No past surgeries to right hand. Admits to recently having gel manicure. Afebrile. Vital stable.    Past Medical History:  Diagnosis Date   Aortic aneurysm (HCC)    Arthritis    OA BOTH KNEES AND HANDS   Atrial tachycardia (HCC)    HX OF   GERD  (gastroesophageal reflux disease)    Glaucoma    left eye   Goiter    CAUSING COUGH, HOARSINESS AND DRY THROAT   Headache(784.0)    MIGRAINES    Hyperlipidemia    Neuralgia    Overactive bladder    RLS (restless legs syndrome)    Temporal arteritis (HCC)    Past Surgical History:  Procedure Laterality Date   ARTERY BIOPSY Right 07/21/2012   Procedure: BIOPSY TEMPORAL ARTERY;  Surgeon: Darcella Earnest, MD;  Location: [facility];  Service: General;  Laterality: Right;   CERVICAL FUSION  2012   Dr. Nigel Bart   EYE SURGERY     CATARACT EXTRACTION- BILATERAL   INCONTINENCE SURGERY     JOINT REPLACEMENT     right knee   LEFT SHOULDER SURGERY     RIGHT KNEE ARTHROSCOPY  11/2009     ROBOTIC ASSISTED BILATERAL SALPINGO OOPHERECTOMY Bilateral 12/08/2013   Procedure: ROBOTIC ASSISTED LAPAROSCOPIC BILATERAL SALPINGO OOPHORECTOMY WITH STAGING;  Surgeon: Alphonso Aschoff, MD;  Location: [facility];  Service: Gynecology;  Laterality: Bilateral;   TOTAL HIP ARTHROPLASTY Left 04/03/2022   Procedure: TOTAL HIP ARTHROPLASTY ANTERIOR APPROACH;  Surgeon: Saundra Curl, MD;  Location: [facility];  Service: Orthopedics;  Laterality: Left;   TOTAL KNEE ARTHROPLASTY  07/04/2011   Procedure: TOTAL KNEE ARTHROPLASTY;  Surgeon: Aurther Blue, MD;  Location: [facility];  Service: Orthopedics;  Laterality: Right;    Allergies  Allergen Reactions   Lisinopril Other (See Comments) and Cough    Very low BP/"very tired" also  Mold Extract [Trichophyton] Other (See Comments)    Headaches and migraines   Molds & Smuts Other (See Comments)    Headaches and migraines   Sulfa Antibiotics    Codeine Nausea Only   Oxycodone Nausea Only    Outpatient Encounter Medications as of 05/24/2023  Medication Sig   brimonidine (ALPHAGAN) 0.2 % ophthalmic solution Place 1 drop into both eyes 2 (two) times daily.   carbidopa-levodopa (SINEMET IR) 25-100 MG tablet Take 0.5 tablets by mouth 2 (two) times daily. -Try to separate Sinemet from  food (especially protein-rich foods like meat, dairy, eggs) by about 30-60 mins - this will help the absorption of the medication. If you have some nausea with the medication, you can take it with some light food like crackers or ginger ale. Take in the morning and then late afternoon.   METROCREAM 0.75 % cream Apply 1 Application topically.   mirtazapine (REMERON) 15 MG tablet TAKE 1 TABLET BY MOUTH EVERYDAY AT BEDTIME   polyethylene glycol (MIRALAX / GLYCOLAX) 17 g packet Take 17 g by mouth daily.   rivastigmine (EXELON) 3 MG capsule Take 1 capsule (3 mg total) by mouth 2 (two) times daily.   rosuvastatin (CRESTOR) 10 MG tablet TAKE 1 TABLET BY MOUTH EVERY DAY   No facility-administered encounter medications on file as of 05/24/2023.    Review of Systems  Constitutional:  Negative for fever.  Respiratory:  Negative for shortness of breath.   Cardiovascular:  Negative for chest pain.  Skin:  Positive for color change.  Allergic/Immunologic: Negative for environmental allergies.  Psychiatric/Behavioral:  Positive for confusion and dysphoric mood. Negative for sleep disturbance. The patient is not nervous/anxious.     Immunization History  Administered Date(s) Administered   Fluad Trivalent(High Dose 65+) 11/15/2022   Influenza Split 11/13/2012   Influenza, High Dose Seasonal PF 11/12/2016, 10/18/2017, 10/10/2018   Influenza,inj,quad, With Preservative 10/10/2018   Influenza-Unspecified 11/11/2008, 10/31/2009, 11/07/2011, 11/11/2013, 11/13/2016   Moderna Covid-19 Fall Seasonal Vaccine 87yrs & older 11/15/2022   Moderna Covid-19 Vaccine Bivalent Booster 18yrs & up 11/22/2021   PFIZER Comirnaty(Gray Top)Covid-19 Tri-Sucrose Vaccine 11/22/2021   Pneumococcal Conjugate-13 06/24/2013   Pneumococcal Polysaccharide-23 09/03/2014   Respiratory Syncytial Virus Vaccine,Recomb Aduvanted(Arexvy) 11/22/2021   Td 12/11/2017   Zoster Recombinant(Shingrix) 08/10/2016, 11/19/2016   Zoster, Live  06/24/2013   Zoster, Unspecified 11/19/2016   Pertinent  Health Maintenance Due  Topic Date Due   INFLUENZA VACCINE  09/13/2023   DEXA SCAN  Completed      12/19/2021    4:47 PM 01/16/2022    3:58 PM 07/24/2022    2:47 PM 10/01/2022    1:09 PM 11/27/2022    1:25 PM  Fall Risk  Falls in the past year? 0 0 0 1 0  Was there an injury with Fall? 0 0 0 1 0  Fall Risk Category Calculator 0 0 0 2 0  Fall Risk Category (Retired) Low Low     (RETIRED) Patient Fall Risk Level Low fall risk Low fall risk     Patient at Risk for Falls Due to No Fall Risks No Fall Risks No Fall Risks History of fall(s) No Fall Risks  Fall risk Follow up Falls evaluation completed Falls evaluation completed Falls evaluation completed Falls evaluation completed Falls evaluation completed   Functional Status Survey:    Vitals:   05/24/23 1645  BP: (!) 144/80  Pulse: 85  Resp: 17  Temp: (!) 97.5 F (36.4 C)  SpO2: 95%  Weight:  107 lb 6.4 oz (48.7 kg)  Height: 5\' 2"  (1.575 m)   Body mass index is 19.64 kg/m. Physical Exam Vitals reviewed.  Constitutional:      General: She is not in acute distress. HENT:     Head: Normocephalic.  Eyes:     General:        Right eye: No discharge.        Left eye: No discharge.  Cardiovascular:     Rate and Rhythm: Normal rate and regular rhythm.     Pulses: Normal pulses.     Heart sounds: Normal heart sounds.  Pulmonary:     Effort: Pulmonary effort is normal.     Breath sounds: Normal breath sounds.  Musculoskeletal:     Cervical back: Neck supple.     Comments: Right hand FROM, hand grip 5/5, radial pulse 2+  Skin:    Capillary Refill: Capillary refill takes less than 2 seconds.     Comments: Increased swelling to right thumb, especially near nail bed, erythema from thumb past wrist, tenderness noted on exam, mild warmth, no skin injury noted.   Neurological:     General: No focal deficit present.     Mental Status: She is alert and oriented to person,  place, and time.  Psychiatric:        Mood and Affect: Mood normal.     Labs reviewed: Recent Labs    11/20/22 0000  NA 139  139  K 4.3  4.3  CL 102  102  CO2 24*  24*  BUN 25*  25*  CREATININE 0.8  0.8  CALCIUM 9.5  9.5   Recent Labs    11/20/22 0000  AST 28  28  ALT 23  23  ALKPHOS 110  110  ALBUMIN 4.4  4.4   Recent Labs    11/20/22 0000  WBC 4.8  HGB 13.5  HCT 40  PLT 191   Lab Results  Component Value Date   TSH 1.20 11/20/2022   Lab Results  Component Value Date   HGBA1C 5.4 05/21/2014   Lab Results  Component Value Date   CHOL 156 11/20/2022   HDL 64 11/20/2022   LDLCALC 81 11/20/2022   TRIG 52 11/20/2022   CHOLHDL 2.0 03/18/2020    Significant Diagnostic Results in last 30 days:  No results found.  Assessment/Plan 1. Cellulitis of right thumb (Primary) - swelling, erythema, tenderness x 2 days, afebrile - suspect from recently having nails done - start doxycycline x 14 days for cellulitis - doxycycline (VIBRA-TABS) 100 MG tablet; Take 1 tablet (100 mg total) by mouth 2 (two) times daily for 14 days.    Family/ staff Communication: plan discussed with patient and nurse  Labs/tests ordered:  none

## 2023-05-28 ENCOUNTER — Encounter: Payer: Self-pay | Admitting: Internal Medicine

## 2023-05-28 ENCOUNTER — Non-Acute Institutional Stay: Payer: Medicare Other | Admitting: Internal Medicine

## 2023-05-28 VITALS — BP 118/68 | HR 88 | Temp 98.3°F | Resp 20 | Ht 62.0 in | Wt 106.8 lb

## 2023-05-28 DIAGNOSIS — L03011 Cellulitis of right finger: Secondary | ICD-10-CM | POA: Diagnosis not present

## 2023-05-28 DIAGNOSIS — R634 Abnormal weight loss: Secondary | ICD-10-CM

## 2023-05-28 DIAGNOSIS — L84 Corns and callosities: Secondary | ICD-10-CM

## 2023-05-28 DIAGNOSIS — G20A1 Parkinson's disease without dyskinesia, without mention of fluctuations: Secondary | ICD-10-CM | POA: Diagnosis not present

## 2023-05-28 DIAGNOSIS — K219 Gastro-esophageal reflux disease without esophagitis: Secondary | ICD-10-CM | POA: Diagnosis not present

## 2023-05-28 DIAGNOSIS — E782 Mixed hyperlipidemia: Secondary | ICD-10-CM | POA: Diagnosis not present

## 2023-05-28 DIAGNOSIS — F5101 Primary insomnia: Secondary | ICD-10-CM

## 2023-05-28 DIAGNOSIS — Z96642 Presence of left artificial hip joint: Secondary | ICD-10-CM

## 2023-05-28 DIAGNOSIS — G3184 Mild cognitive impairment, so stated: Secondary | ICD-10-CM

## 2023-05-28 DIAGNOSIS — I7121 Aneurysm of the ascending aorta, without rupture: Secondary | ICD-10-CM | POA: Diagnosis not present

## 2023-05-28 DIAGNOSIS — R498 Other voice and resonance disorders: Secondary | ICD-10-CM | POA: Diagnosis not present

## 2023-05-28 NOTE — Progress Notes (Unsigned)
 Location:  [facility] features editor of Service:  Clinic (12)  Provider:   Code Status: *** Goals of Care:     10/01/2022    1:09 PM  Advanced Directives  Does Patient Have a Medical Advance Directive? Yes  Type of Estate agent of [place];Living will;Out of facility DNR (pink MOST or yellow form)  Does patient want to make changes to medical advance directive? No - Patient declined     Chief Complaint  Patient presents with   Follow-up    6 month follow up. Patient has concern about thumb on right hand and concerns about small toe on right foot.    HPI: Patient is a 89 y.o. female seen today for medical management of chronic diseases.     Past Medical History:  Diagnosis Date   Aortic aneurysm (HCC)    Arthritis    OA BOTH KNEES AND HANDS   Atrial tachycardia (HCC)    HX OF   GERD (gastroesophageal reflux disease)    Glaucoma    left eye   Goiter    CAUSING COUGH, HOARSINESS AND DRY THROAT   Headache(784.0)    MIGRAINES    Hyperlipidemia    Neuralgia    Overactive bladder    RLS (restless legs syndrome)    Temporal arteritis (HCC)     Past Surgical History:  Procedure Laterality Date   ARTERY BIOPSY Right 07/21/2012   Procedure: BIOPSY TEMPORAL ARTERY;  Surgeon: Currie Paris, MD;  Location: [facility];  Service: General;  Laterality: Right;   CERVICAL FUSION  2012   Dr. Venetia Maxon   EYE SURGERY     CATARACT EXTRACTION- BILATERAL   INCONTINENCE SURGERY     JOINT REPLACEMENT     right knee   LEFT SHOULDER SURGERY     RIGHT KNEE ARTHROSCOPY  11/2009     ROBOTIC ASSISTED BILATERAL SALPINGO OOPHERECTOMY Bilateral 12/08/2013   Procedure: ROBOTIC ASSISTED LAPAROSCOPIC BILATERAL SALPINGO OOPHORECTOMY WITH STAGING;  Surgeon: Adolphus Birchwood, MD;  Location: [facility];  Service: Gynecology;  Laterality: Bilateral;   TOTAL HIP ARTHROPLASTY Left 04/03/2022   Procedure: TOTAL HIP ARTHROPLASTY ANTERIOR APPROACH;  Surgeon: Sheral Apley,  MD;  Location: [facility];  Service: Orthopedics;  Laterality: Left;   TOTAL KNEE ARTHROPLASTY  07/04/2011   Procedure: TOTAL KNEE ARTHROPLASTY;  Surgeon: Loanne Drilling, MD;  Location: [facility];  Service: Orthopedics;  Laterality: Right;    Allergies  Allergen Reactions   Lisinopril Other (See Comments) and Cough    Very low BP/"very tired" also   Mold Extract [Trichophyton] Other (See Comments)    Headaches and migraines   Molds & Smuts Other (See Comments)    Headaches and migraines   Sulfa Antibiotics    Codeine Nausea Only   Oxycodone Nausea Only    Outpatient Encounter Medications as of 05/28/2023  Medication Sig   brimonidine (ALPHAGAN) 0.2 % ophthalmic solution Place 1 drop into both eyes 2 (two) times daily.   carbidopa-levodopa (SINEMET IR) 25-100 MG tablet Take 0.5 tablets by mouth 2 (two) times daily. -Try to separate Sinemet from food (especially protein-rich foods like meat, dairy, eggs) by about 30-60 mins - this will help the absorption of the medication. If you have some nausea with the medication, you can take it with some light food like crackers or ginger ale. Take in the morning and then late afternoon.   doxycycline (VIBRA-TABS) 100 MG tablet Take 1 tablet (100 mg total) by  mouth 2 (two) times daily for 14 days.   METROCREAM 0.75 % cream Apply 1 Application topically.   mirtazapine (REMERON) 15 MG tablet TAKE 1 TABLET BY MOUTH EVERYDAY AT BEDTIME   polyethylene glycol (MIRALAX / GLYCOLAX) 17 g packet Take 17 g by mouth daily.   rivastigmine (EXELON) 3 MG capsule Take 1 capsule (3 mg total) by mouth 2 (two) times daily.   rosuvastatin (CRESTOR) 10 MG tablet TAKE 1 TABLET BY MOUTH EVERY DAY   No facility-administered encounter medications on file as of 05/28/2023.    Review of Systems:  Review of Systems  Health Maintenance  Topic Date Due   COVID-19 Vaccine (3 - 2024-25 season) 11/15/2023 (Originally 05/16/2023)   INFLUENZA VACCINE  09/13/2023   Medicare Annual  Wellness (AWV)  10/01/2023   DTaP/Tdap/Td (2 - Tdap) 12/12/2027   Pneumonia Vaccine 67+ Years old  Completed   DEXA SCAN  Completed   Zoster Vaccines- Shingrix  Completed   HPV VACCINES  Aged Out   Meningococcal B Vaccine  Aged Out    Physical Exam: Vitals:   05/28/23 1404  BP: 118/68  Pulse: 88  Resp: 20  Temp: 98.3 F (36.8 C)  SpO2: 97%  Weight: 106 lb 12.8 oz (48.4 kg)  Height: 5\' 2"  (1.575 m)   Body mass index is 19.53 kg/m. Physical Exam  Labs reviewed: Basic Metabolic Panel: Recent Labs    11/20/22 0000  NA 139  139  K 4.3  4.3  CL 102  102  CO2 24*  24*  BUN 25*  25*  CREATININE 0.8  0.8  CALCIUM 9.5  9.5  TSH 1.20   Liver Function Tests: Recent Labs    11/20/22 0000  AST 28  28  ALT 23  23  ALKPHOS 110  110  ALBUMIN 4.4  4.4   No results for input(s): "LIPASE", "AMYLASE" in the last 8760 hours. No results for input(s): "AMMONIA" in the last 8760 hours. CBC: Recent Labs    11/20/22 0000  WBC 4.8  HGB 13.5  HCT 40  PLT 191   Lipid Panel: Recent Labs    11/20/22 0000  CHOL 156  HDL 64  LDLCALC 81  TRIG 52   Lab Results  Component Value Date   HGBA1C 5.4 05/21/2014    Procedures since last visit: No results found.  Assessment/Plan 1. Foot callus (Primary) *** - Ambulatory referral to Podiatry  2. Cellulitis of right thumb ***  3. MCI (mild cognitive impairment) ***  4. Parkinson Disease ***  5. Hypophonia ***  6. Primary insomnia ***  7. S/P total left hip arthroplasty ***  8. Aneurysm of ascending aorta without rupture (HCC) ***  9. Gastroesophageal reflux disease without esophagitis ***  10. Weight loss ***  11. Mixed hyperlipidemia ***    Labs/tests ordered:  * No order type specified * Next appt:  09/02/2023

## 2023-05-28 NOTE — Telephone Encounter (Signed)
 Info sandy printed re: sinemet IR has been signed by Dr [name]. I faxed it to the ALF. Received a receipt of confirmation.  [phone]

## 2023-05-30 DIAGNOSIS — I10 Essential (primary) hypertension: Secondary | ICD-10-CM | POA: Diagnosis not present

## 2023-05-30 DIAGNOSIS — E559 Vitamin D deficiency, unspecified: Secondary | ICD-10-CM | POA: Diagnosis not present

## 2023-05-30 LAB — LIPID PANEL
Cholesterol: 125 (ref 0–200)
HDL: 53 (ref 35–70)
LDL Cholesterol: 67
LDl/HDL Ratio: 2.4
Triglycerides: 30 — AB (ref 40–160)

## 2023-05-30 LAB — CBC: RBC: 3.97 (ref 3.87–5.11)

## 2023-05-30 LAB — BASIC METABOLIC PANEL WITH GFR
BUN: 26 — AB (ref 4–21)
CO2: 23 — AB (ref 13–22)
Chloride: 107 (ref 99–108)
Creatinine: 0.7 (ref 0.5–1.1)
Potassium: 4.3 meq/L (ref 3.5–5.1)
Sodium: 142 (ref 137–147)

## 2023-05-30 LAB — CBC AND DIFFERENTIAL
HCT: 37 (ref 36–46)
Hemoglobin: 12.4 (ref 12.0–16.0)
Platelets: 153 K/uL (ref 150–400)
WBC: 4

## 2023-05-30 LAB — HEPATIC FUNCTION PANEL
ALT: 27 U/L (ref 7–35)
AST: 25 (ref 13–35)
Bilirubin, Total: 0.4

## 2023-05-30 LAB — COMPREHENSIVE METABOLIC PANEL WITH GFR
Albumin: 3.4 — AB (ref 3.5–5.0)
Calcium: 8.9 (ref 8.7–10.7)
Globulin: 2.2
eGFR: 85

## 2023-05-30 LAB — TSH: TSH: 0.65 (ref 0.41–5.90)

## 2023-06-03 DIAGNOSIS — H401422 Capsular glaucoma with pseudoexfoliation of lens, left eye, moderate stage: Secondary | ICD-10-CM | POA: Diagnosis not present

## 2023-06-10 ENCOUNTER — Ambulatory Visit: Admitting: Podiatry

## 2023-06-10 ENCOUNTER — Ambulatory Visit (INDEPENDENT_AMBULATORY_CARE_PROVIDER_SITE_OTHER)

## 2023-06-10 ENCOUNTER — Encounter: Payer: Self-pay | Admitting: Podiatry

## 2023-06-10 DIAGNOSIS — D2371 Other benign neoplasm of skin of right lower limb, including hip: Secondary | ICD-10-CM

## 2023-06-10 DIAGNOSIS — M2041 Other hammer toe(s) (acquired), right foot: Secondary | ICD-10-CM

## 2023-06-10 NOTE — Progress Notes (Signed)
 Chief Complaint  Patient presents with   Callouses    "I have a corn that is killing me." N - corn  L - 5th right D - 1 month O - suddenly, gradually worse C - tender, sore A - walking, narrow shoe T - bandaid    Subjective: 89 y.o. female presenting to the office today as a reestablish new patient for evaluation of a symptomatic skin lesion to the fifth digit of the right foot.  Onset about 1 month ago.  She has been applying a Band-Aid.  Idiopathic onset   Past Medical History:  Diagnosis Date   Aortic aneurysm (HCC)    Arthritis    OA BOTH KNEES AND HANDS   Atrial tachycardia (HCC)    HX OF   GERD (gastroesophageal reflux disease)    Glaucoma    left eye   Goiter    CAUSING COUGH, HOARSINESS AND DRY THROAT   Headache(784.0)    MIGRAINES    Hyperlipidemia    Neuralgia    Overactive bladder    RLS (restless legs syndrome)    Temporal arteritis (HCC)     Past Surgical History:  Procedure Laterality Date   ARTERY BIOPSY Right 07/21/2012   Procedure: BIOPSY TEMPORAL ARTERY;  Surgeon: Darcella Earnest, MD;  Location: [facility];  Service: General;  Laterality: Right;   CERVICAL FUSION  2012   Dr. Nigel Bart   EYE SURGERY     CATARACT EXTRACTION- BILATERAL   INCONTINENCE SURGERY     JOINT REPLACEMENT     right knee   LEFT SHOULDER SURGERY     RIGHT KNEE ARTHROSCOPY  11/2009     ROBOTIC ASSISTED BILATERAL SALPINGO OOPHERECTOMY Bilateral 12/08/2013   Procedure: ROBOTIC ASSISTED LAPAROSCOPIC BILATERAL SALPINGO OOPHORECTOMY WITH STAGING;  Surgeon: Alphonso Aschoff, MD;  Location: [facility];  Service: Gynecology;  Laterality: Bilateral;   TOTAL HIP ARTHROPLASTY Left 04/03/2022   Procedure: TOTAL HIP ARTHROPLASTY ANTERIOR APPROACH;  Surgeon: Saundra Curl, MD;  Location: [facility];  Service: Orthopedics;  Laterality: Left;   TOTAL KNEE ARTHROPLASTY  07/04/2011   Procedure: TOTAL KNEE ARTHROPLASTY;  Surgeon: Aurther Blue, MD;  Location: [facility];  Service: Orthopedics;  Laterality:  Right;    Allergies  Allergen Reactions   Lisinopril  Other (See Comments) and Cough    Very low BP/"very tired" also   Mold Extract [Trichophyton] Other (See Comments)    Headaches and migraines   Molds & Smuts Other (See Comments)    Headaches and migraines   Sulfa Antibiotics    Codeine Nausea Only   Oxycodone  Nausea Only     Objective:  Physical Exam General: Alert and oriented x3 in no acute distress  Dermatology: Hyperkeratotic lesion(s) present on the fifth digit right foot. Pain on palpation with a central nucleated core noted. Skin is warm, dry and supple bilateral lower extremities. Negative for open lesions or macerations.  Vascular: Palpable pedal pulses bilaterally. No edema or erythema noted. Capillary refill within normal limits.  Neurological: Grossly intact via light touch  Musculoskeletal Exam: Pain on palpation at the keratotic lesion(s) noted. Range of motion within normal limits bilateral. Muscle strength 5/5 in all groups bilateral.  Hammertoes noted to the lesser digits  Assessment: 1.  Symptomatic benign skin lesion right fifth toe 2.  Hammertoe lesser digits right foot   Plan of Care:  -Patient evaluated -Excisional debridement of keratoic lesion(s) using a chisel blade was performed without incident.  -Salicylic acid applied with a bandaid -  Return to the clinic PRN.   Janet Wilson, DPM [facility]  Dr. Dot Wilson, DPM    [address] [place], [place] [address]                Office [phone]  Fax [phone]

## 2023-06-12 ENCOUNTER — Ambulatory Visit: Attending: Neurology

## 2023-06-12 ENCOUNTER — Other Ambulatory Visit: Payer: Self-pay

## 2023-06-12 ENCOUNTER — Other Ambulatory Visit: Payer: Self-pay | Admitting: Neurology

## 2023-06-12 ENCOUNTER — Ambulatory Visit

## 2023-06-12 DIAGNOSIS — R262 Difficulty in walking, not elsewhere classified: Secondary | ICD-10-CM | POA: Insufficient documentation

## 2023-06-12 DIAGNOSIS — R2681 Unsteadiness on feet: Secondary | ICD-10-CM | POA: Diagnosis not present

## 2023-06-12 DIAGNOSIS — R1310 Dysphagia, unspecified: Secondary | ICD-10-CM | POA: Diagnosis not present

## 2023-06-12 DIAGNOSIS — R471 Dysarthria and anarthria: Secondary | ICD-10-CM | POA: Diagnosis not present

## 2023-06-12 DIAGNOSIS — M6281 Muscle weakness (generalized): Secondary | ICD-10-CM

## 2023-06-12 DIAGNOSIS — R498 Other voice and resonance disorders: Secondary | ICD-10-CM | POA: Diagnosis not present

## 2023-06-12 DIAGNOSIS — G20A1 Parkinson's disease without dyskinesia, without mention of fluctuations: Secondary | ICD-10-CM | POA: Diagnosis not present

## 2023-06-12 DIAGNOSIS — R29818 Other symptoms and signs involving the nervous system: Secondary | ICD-10-CM

## 2023-06-12 DIAGNOSIS — R2689 Other abnormalities of gait and mobility: Secondary | ICD-10-CM | POA: Diagnosis not present

## 2023-06-12 DIAGNOSIS — R41841 Cognitive communication deficit: Secondary | ICD-10-CM | POA: Insufficient documentation

## 2023-06-12 NOTE — Therapy (Signed)
 OUTPATIENT PHYSICAL THERAPY NEURO EVALUATION   Patient Name: Janet Wilson MRN: [id number] DOB:02/16/1934, 89 y.o., female Today's Date: 06/12/2023   PCP: Marguerite Shiley, MD REFERRING PROVIDER: Glory Larsen, MD  END OF SESSION:  PT End of Session - 06/12/23 0914     Visit Number 1    Number of Visits 13    Date for PT Re-Evaluation 07/24/23    Authorization Type [employer]    PT Start Time 0930    PT Stop Time 1015    PT Time Calculation (min) 45 min             Past Medical History:  Diagnosis Date   Aortic aneurysm (HCC)    Arthritis    OA BOTH KNEES AND HANDS   Atrial tachycardia (HCC)    HX OF   GERD (gastroesophageal reflux disease)    Glaucoma    left eye   Goiter    CAUSING COUGH, HOARSINESS AND DRY THROAT   Headache(784.0)    MIGRAINES    Hyperlipidemia    Neuralgia    Overactive bladder    RLS (restless legs syndrome)    Temporal arteritis (HCC)    Past Surgical History:  Procedure Laterality Date   ARTERY BIOPSY Right 07/21/2012   Procedure: BIOPSY TEMPORAL ARTERY;  Surgeon: Darcella Earnest, MD;  Location: [facility];  Service: General;  Laterality: Right;   CERVICAL FUSION  2012   Dr. Nigel Bart   EYE SURGERY     CATARACT EXTRACTION- BILATERAL   INCONTINENCE SURGERY     JOINT REPLACEMENT     right knee   LEFT SHOULDER SURGERY     RIGHT KNEE ARTHROSCOPY  11/2009     ROBOTIC ASSISTED BILATERAL SALPINGO OOPHERECTOMY Bilateral 12/08/2013   Procedure: ROBOTIC ASSISTED LAPAROSCOPIC BILATERAL SALPINGO OOPHORECTOMY WITH STAGING;  Surgeon: Alphonso Aschoff, MD;  Location: [facility];  Service: Gynecology;  Laterality: Bilateral;   TOTAL HIP ARTHROPLASTY Left 04/03/2022   Procedure: TOTAL HIP ARTHROPLASTY ANTERIOR APPROACH;  Surgeon: Saundra Curl, MD;  Location: [facility];  Service: Orthopedics;  Laterality: Left;   TOTAL KNEE ARTHROPLASTY  07/04/2011   Procedure: TOTAL KNEE ARTHROPLASTY;  Surgeon: Aurther Blue, MD;  Location: [facility];  Service: Orthopedics;  Laterality: Right;   Patient Active Problem List   Diagnosis Date Noted   Dysphagia 07/24/2022   Flatulence, eructation and gas pain 07/24/2022   Hip pain 07/24/2022   Left lower quadrant pain 07/24/2022   Aortic valve disease 04/04/2022   Closed left hip fracture (HCC) 04/02/2022   Aortic aneurysm (HCC) 04/02/2022   Dementia (HCC) 01/21/2022   Post-nasal drip 12/20/2021   Gastroesophageal reflux disease without esophagitis 12/20/2021   Depression with anxiety 12/20/2021   Cellulitis of right leg 12/20/2021   Primary insomnia 12/20/2021   Hyperlipidemia 12/20/2021   Conjunctivitis, chronic, follicular 03/06/2021   Osteoarthritis of right glenohumeral joint 04/04/2020   Coronary artery calcification 12/18/2019   Aortic atherosclerosis (HCC) 12/18/2019   Pure hypercholesterolemia 12/18/2019   Tear of lateral meniscus of knee 01/21/2019   Senile osteopenia 03/20/2018   H/O temporal arteritis 03/20/2018   Memory loss, short term 03/20/2018   Weight loss 03/20/2018   Nausea 03/20/2018   Mixed urge and stress incontinence 03/20/2018   Multinodular goiter (nontoxic) 03/20/2018   RLS (restless legs syndrome) 12/19/2017   Pain in right knee 10/09/2017   Ectopic atrial tachycardia (HCC) 10/08/2016   Cough 09/06/2015   Hoarseness of voice 09/06/2015  Rhinitis, chronic 09/06/2015   Bilateral occipital neuralgia 06/16/2015   Convergence insufficiency 08/09/2014   Neck pain 03/05/2013   Capsular glaucoma with pseudoexfoliation of lens, left eye, moderate stage 12/22/2012   Right temporal headache 06/30/2012   Family history of coronary artery disease 09/24/2011   S/P TKR (total knee replacement) 09/24/2011   Tachycardia 08/10/2011   OA (osteoarthritis) of knee 07/04/2011    ONSET DATE: "several months ago"  REFERRING DIAG: G20.A1 (ICD-10-CM) - Parkinson disease, symptomatic (HCC) R26.9 (ICD-10-CM) - Gait disorder W19.Benny Braver (ICD-10-CM) - Fall, initial  encounter  THERAPY DIAG:  Difficulty in walking, not elsewhere classified  Muscle weakness (generalized)  Other abnormalities of gait and mobility  Unsteadiness on feet  Rationale for Evaluation and Treatment: Rehabilitation  SUBJECTIVE:                                                                                                                                                                                             SUBJECTIVE STATEMENT: Has had a fairly recent dx of PD and noting reduced gait speed and had been using cane and now use of 4WW. Has been attending [employer] 2x/wk. Has had several falls within the past year, most notably a fall x 1 year ago breaking left hip and s/p THR. Has been having increased mobility deficits as of late.  Pt accompanied by: self  PERTINENT HISTORY: is going to assisted living at [facility]. She fell she tripped over a chandelier overnight. She was going to bed. No freezing episodes, shuffling a little bit more  PAIN:  Are you having pain?  Notes pain in left knee and right shoulder  PRECAUTIONS: Fall  RED FLAGS: None   WEIGHT BEARING RESTRICTIONS: No  FALLS: Has patient fallen in last 6 months? Yes. Number of falls broke her left hip in a fall x 1 year ago. One fall in past 6 moths  LIVING ENVIRONMENT: Lives with: lives alone Lives in: Other [facility] in ALF, assist for medication, and supervision for bathing Stairs:    Has following equipment at home: Single point cane and Environmental consultant - 4 wheeled  PLOF: Independent with community mobility with device and Needs assistance with homemaking  PATIENT GOALS:   OBJECTIVE:  Note: Objective measures were completed at Evaluation unless otherwise noted.  DIAGNOSTIC FINDINGS:   COGNITION: Overall cognitive status:  unable to perform 3-item recall after 1-2 min, requiring prompts 2/3 recall   SENSATION: WFL  COORDINATION: Difficulty with rapid alternating movement Heel to shin  grossly impaired Finger to nose WNL    MUSCLE TONE: increased tone to RUE flexors and right hamstrings  POSTURE: rounded shoulders, forward head, and increased thoracic kyphosis  LOWER EXTREMITY ROM:     Active  Right Eval Left Eval  Hip flexion    Hip extension    Hip abduction    Hip adduction    Hip internal rotation    Hip external rotation    Knee flexion 120 120  Knee extension -5 0  Ankle dorsiflexion 15 15  Ankle plantarflexion    Ankle inversion    Ankle eversion     (Blank rows = not tested)  LOWER EXTREMITY MMT:    4/5 gross BLE  BED MOBILITY:  Not tested, reports modified independence use bed rails  TRANSFERS: independent    CURB:  Findings: CGA  STAIRS: Not tested GAIT: Findings: Distance walked:   and Comments: narrow BOS, 5-6 steps for turning  FUNCTIONAL TESTS:  5 times sit to stand: 12 sec--difficulty with full upright posture/extension Mini-BESTest: 16/28 3 Meter Backwards Walk Test: NT 10 Meter Walk Test: 16 sec - 2.0 ft/sec  [facility] PT Assessment - 06/12/23 0001       Standardized Balance Assessment   Standardized Balance Assessment Mini-BESTest;Five Times Sit to Stand    Five times sit to stand comments  12.78      Mini-BESTest   Sit To Stand Normal: Comes to stand without use of hands and stabilizes independently.    Rise to Toes Moderate: Heels up, but not full range (smaller than when holding hands), OR noticeable instability for 3 s.    Stand on one leg (left) Moderate: < 20 s    Stand on one leg (right) Moderate: < 20 s    Stand on one leg - lowest score 1    Compensatory Stepping Correction - Forward Moderate: More than one step is required to recover equilibrium    Compensatory Stepping Correction - Backward Moderate: More than one step is required to recover equilibrium    Compensatory Stepping Correction - Left Lateral Normal: Recovers independently with 1 step (crossover or lateral OK)    Compensatory Stepping  Correction - Right Lateral Moderate: Several steps to recover equilibrium    Stepping Corredtion Lateral - lowest score 1    Stance - Feet together, eyes open, firm surface  Normal: 30s    Stance - Feet together, eyes closed, foam surface  Normal: 30s    Incline - Eyes Closed Moderate: Stands independently < 30s OR aligns with surface    Change in Gait Speed Normal: Significantly changes walkling speed without imbalance    Walk with head turns - Horizontal Moderate: performs head turns with reduction in gait speed.    Walk with pivot turns Moderate:Turns with feet close SLOW (>4 steps) with good balance.    Step over obstacles Severe: Unable to step over box OR steps around box    Timed UP & GO with Dual Task Severe: Stops counting while walking OR stops walking while counting.   regular: 18.97 sec; cog:   Mini-BEST total score 16  TREATMENT DATE:     PATIENT EDUCATION: Education details: assessment details, rationale of PT intervention Person educated: Patient Education method: Explanation Education comprehension: verbalized understanding  HOME EXERCISE PROGRAM: TBD  GOALS: Goals reviewed with patient? Yes  SHORT TERM GOALS: Target date: 07/03/2023    Patient will be independent in HEP to improve functional outcomes Baseline: Goal status: INITIAL    LONG TERM GOALS: Target date: 07/24/2023    Demo improved motor control and reduced risk for falls per score 24/28 Mini-BESTest Baseline: 16/28 Goal status: INITIAL  2.  Perform 3 Meter Backwards Walk Test in 4.8 sec to reduce risk for falls Baseline: TBD Goal status: INITIAL  3.  Teach-back relevant strategies for exercise interventions as it pertains to PD Baseline:  Goal status: INITIAL  4.  Maintain gait speed of 2.8 ft/sec to improve efficiency of community  ambulation Baseline: 2.0 ft/sec Goal status: INITIAL    ASSESSMENT:  CLINICAL IMPRESSION: Patient is a 89 y.o. lady who was seen today for physical therapy evaluation and treatment for PD. Exhibits generalized BLE weakness, difficulty with rapid movement coordination, increased risk for falls per outcome measures and reduced gait speed and balance with deficits during turning.  Patient would benefit from PT services to provide relevant intervention, compensatory, and adaptive strategies to improve functional independence and mobility and reduce risk for falls.   OBJECTIVE IMPAIRMENTS: Abnormal gait, decreased activity tolerance, decreased balance, decreased coordination, decreased mobility, difficulty walking, decreased strength, impaired tone, and postural dysfunction.   ACTIVITY LIMITATIONS: carrying, lifting, bending, squatting, transfers, and locomotion level  PARTICIPATION LIMITATIONS: community activity and exercise classes  PERSONAL FACTORS: Age, Time since onset of injury/illness/exacerbation, and 3+ comorbidities: PMH  are also affecting patient's functional outcome.   REHAB POTENTIAL: Good  CLINICAL DECISION MAKING: Evolving/moderate complexity  EVALUATION COMPLEXITY: Moderate  PLAN:  PT FREQUENCY: 1-2x/week  PT DURATION: 6 weeks  PLANNED INTERVENTIONS: 97750- Physical Performance Testing, 97110-Therapeutic exercises, 97530- Therapeutic activity, [id number]- Neuromuscular re-education, 97535- Self Care, [id number]- Manual therapy, [id number]- Gait training, [phone]- Canalith repositioning, and [id number]- Aquatic Therapy  PLAN FOR NEXT SESSION: , initiate HEP for weight shifting, quarter turns   3:18 PM, 06/12/23 M. Kelly Roselina Burgueno, PT, DPT Physical Therapist- [place] Office Number: [phone]

## 2023-06-12 NOTE — Therapy (Addendum)
 OUTPATIENT SPEECH LANGUAGE PATHOLOGY PARKINSON'S EVALUATION   Patient Name: Janet Wilson MRN: [id number] DOB:1934-09-22, 89 y.o., female Today's Date: 06/12/2023 1732  PCP: Joanell Mowers, MD REFERRING PROVIDER: Glory Larsen, MD  END OF SESSION:          Visit Number   1    Number of Visits 17     Date for SLP Re-Evaluation 08/30/23   due to ST not beginning until 07/10/23    SLP Start Time 1020     SLP Stop Time  1100     SLP Time Calculation (min) 40 min     Activity Tolerance Patient tolerated treatment well       Past Medical History:  Diagnosis Date   Aortic aneurysm (HCC)    Arthritis    OA BOTH KNEES AND HANDS   Atrial tachycardia (HCC)    HX OF   GERD (gastroesophageal reflux disease)    Glaucoma    left eye   Goiter    CAUSING COUGH, HOARSINESS AND DRY THROAT   Headache(784.0)    MIGRAINES    Hyperlipidemia    Neuralgia    Overactive bladder    RLS (restless legs syndrome)    Temporal arteritis (HCC)    Past Surgical History:  Procedure Laterality Date   ARTERY BIOPSY Right 07/21/2012   Procedure: BIOPSY TEMPORAL ARTERY;  Surgeon: Darcella Earnest, MD;  Location: [facility];  Service: General;  Laterality: Right;   CERVICAL FUSION  2012   Dr. Nigel Bart   EYE SURGERY     CATARACT EXTRACTION- BILATERAL   INCONTINENCE SURGERY     JOINT REPLACEMENT     right knee   LEFT SHOULDER SURGERY     RIGHT KNEE ARTHROSCOPY  11/2009     ROBOTIC ASSISTED BILATERAL SALPINGO OOPHERECTOMY Bilateral 12/08/2013   Procedure: ROBOTIC ASSISTED LAPAROSCOPIC BILATERAL SALPINGO OOPHORECTOMY WITH STAGING;  Surgeon: Alphonso Aschoff, MD;  Location: [facility];  Service: Gynecology;  Laterality: Bilateral;   TOTAL HIP ARTHROPLASTY Left 04/03/2022   Procedure: TOTAL HIP ARTHROPLASTY ANTERIOR APPROACH;  Surgeon: Saundra Curl, MD;  Location: [facility];  Service: Orthopedics;  Laterality: Left;   TOTAL KNEE ARTHROPLASTY  07/04/2011   Procedure: TOTAL KNEE ARTHROPLASTY;  Surgeon:  Aurther Blue, MD;  Location: [facility];  Service: Orthopedics;  Laterality: Right;   Patient Active Problem List   Diagnosis Date Noted   Dysphagia 07/24/2022   Flatulence, eructation and gas pain 07/24/2022   Hip pain 07/24/2022   Left lower quadrant pain 07/24/2022   Aortic valve disease 04/04/2022   Closed left hip fracture (HCC) 04/02/2022   Aortic aneurysm (HCC) 04/02/2022   Dementia (HCC) 01/21/2022   Post-nasal drip 12/20/2021   Gastroesophageal reflux disease without esophagitis 12/20/2021   Depression with anxiety 12/20/2021   Cellulitis of right leg 12/20/2021   Primary insomnia 12/20/2021   Hyperlipidemia 12/20/2021   Conjunctivitis, chronic, follicular 03/06/2021   Osteoarthritis of right glenohumeral joint 04/04/2020   Coronary artery calcification 12/18/2019   Aortic atherosclerosis (HCC) 12/18/2019   Pure hypercholesterolemia 12/18/2019   Tear of lateral meniscus of knee 01/21/2019   Senile osteopenia 03/20/2018   H/O temporal arteritis 03/20/2018   Memory loss, short term 03/20/2018   Weight loss 03/20/2018   Nausea 03/20/2018   Mixed urge and stress incontinence 03/20/2018   Multinodular goiter (nontoxic) 03/20/2018   RLS (restless legs syndrome) 12/19/2017   Pain in right knee 10/09/2017   Ectopic atrial tachycardia (HCC) 10/08/2016   Cough 09/06/2015  Hoarseness of voice 09/06/2015   Rhinitis, chronic 09/06/2015   Bilateral occipital neuralgia 06/16/2015   Convergence insufficiency 08/09/2014   Neck pain 03/05/2013   Capsular glaucoma with pseudoexfoliation of lens, left eye, moderate stage 12/22/2012   Right temporal headache 06/30/2012   Family history of coronary artery disease 09/24/2011   S/P TKR (total knee replacement) 09/24/2011   Tachycardia 08/10/2011   OA (osteoarthritis) of knee 07/04/2011    ONSET DATE: "About 4 years ago" Script 05/13/23  REFERRING DIAG: R47.1 (ICD-10-CM) - Dysarthria R49.8 (ICD-10-CM) - Hypophonia G20.A1 (ICD-10-CM)  - Parkinson disease, symptomatic (HCC)  THERAPY DIAG:  Dysarthria and anarthria - Plan: SLP plan of care cert/re-cert  Cognitive communication deficit - Plan: SLP plan of care cert/re-cert  Dysphagia, unspecified type - Plan: SLP plan of care cert/re-cert  Rationale for Evaluation and Treatment: Rehabilitation  SUBJECTIVE:   SUBJECTIVE STATEMENT: "I resented it because I was trying to talk louder but they said they still couldn't hear me."  Pt accompanied by: self  PERTINENT HISTORY: See above for PMHx 05/13/2023: [name]) DAT scan positive for Parkinson's Disorder likely idiopathic Parkinson's disease. Her speech pattern is messy, having problems speaking. She was evaluated for speech at [facility]. She is involved in Boxing. Discussed exercise is the only thing known to slow down progression of parkinson's disease. She is doing that 2x a week. Her husband went to memory care and he is at rehab and then going to skilled care. Wife is going to assisted living at [facility]. She fell she tripped over a chandelier overnight. She was going to bed. No freezing episodes, shuffling a little bit more, Drinks out of a straw. She had an experience with a slider, she took a slice and she couldn;t swallow, got lodged at the back of her throat, has to drink out of a straw. She declines a swallow study, states she is being more careful and chews thoroughly. [facility] has a dermatologist, discussed PT at [facility].   PAIN:  Are you having pain? Yes: NPRS scale: 5/10 Pain location: lt knee Pain description: sore Aggravating factors: IDK Relieving factors: stretching  FALLS: Has patient fallen in last 6 months?  See PT evaluation for details  LIVING ENVIRONMENT: Lives with: lives in an assisted living facility Lives in: Other ALF  PLOF:  Level of assistance: Independent with ADLs, Needed assistance with IADLS Employment: Retired  PATIENT GOALS: "I'd like to talk louder."  OBJECTIVE:   Note: Objective measures were completed at Evaluation unless otherwise noted.  DIAGNOSTIC FINDINGS:  DaTSCAN  IMPRESSION: Bilateral reduced radiotracer activity within the striata. Greater loss of activity in the RIGHT striatum. Pattern is typical of Parkinsonian syndrome pathology.   Of note, DaTSCAN  is not diagnostic of Parkinsonian syndromes, which remains a clinical diagnosis. DaTscan  is an adjuvant test to aid in the clinical diagnosis of Parkinsonian syndromes.     Electronically Signed   By: Deboraha Fallow M.D.   On: 03/07/2023   COGNITION: Overall cognitive status: Impaired Areas of impairment: Attention, Memory, and Awareness Comments: Pt endorses often forgetting what she's said if someone asks her to repeat herself. Pt was surprised when SLP pointed out that fine motor control might be different and took SLP providing examples for her to realize fine motor control was difficult.  SLP contacted referring MD and suggested OT eval. [name] manages finances. Pt is on assisted living unit at [facility] and has assistance PRN with ADLs/IADLs.  MOTOR SPEECH: Overall motor speech: impaired Level of  impairment: Word, Phrase, and Sentence Respiration: thoracic breathing and clavicular breathing Phonation: hoarse and low vocal intensity Resonance: WFL Articulation: Impaired: word, phrase, sentence, and conversation Intelligibility: Intelligibility reduced 90-95% Motor planning: Appears intact Effective technique: increased vocal intensity  ORAL MOTOR EXAMINATION: Overall status: Impaired: Labial: Bilateral (ROM, Strength, and Coordination) Lingual: Bilateral (ROM, Strength, and Coordination) Facial: Bilateral (ROM) Velum: ROM Comments: Pt req'd min cues and a mirror to complete oral motor tasks correctly.  OBJECTIVE VOICE ASSESSMENT: Sustained "ah" maximum phonation time: 9.8 seconds Sustained "ah" loudness average: 64-75 dB Oral reading (passage) loudness  average: 64 dB Oral reading loudness range: 56-71 dB Conversational loudness average: 65 dB Conversational loudness range: 54-73 dB Voice quality: hoarse, breathy, and low vocal intensity Stimulability trials: Given SLP modeling and usual mod cues, loudness average increased to 69dB (range of 60 to 73) with word level responses.  Comments: Pt's reduced cognition and processing made responding more difficult.  Completed audio recording of patients baseline voice without cueing from SLP: Yes  SLP will need to assess pt's swallowing with BSE next session. Given oral motor exam today, SLP suspects pt's swallowing is abnormal. Pt denies difficulty with meals, however.   PATIENT REPORTED OUTCOME MEASURES (PROM): Communication Effectiveness Survey: to be completed in first 2 sessions                                                                                                                            TREATMENT DATE:   06/12/23:  SLP introduced pt to role of SLP in her course of ST. Pt req'd usual  mod cues to shape her loud /a/, as well as usual mod-max cues to use intentional, stronger speech in some diagnostic therapy for louder speech. SLP educated pt that she needs to feel like she is shouting for her to be WNL volume. SLP provided examples with auditory recordings of pt for this. She will need further instruction with this.   PATIENT EDUCATION: Education details: see "treatment date" for details.  Person educated: Patient Education method: Explanation, Demonstration, and Verbal cues Education comprehension: verbalized understanding, returned demonstration, verbal cues required, and needs further education  HOME EXERCISE PROGRAM: Eventually loud /a/, and everyday sentences  GOALS: Goals reviewed with patient? No   SHORT TERM GOALS: Target date: 08/02/23 (due to ST not beginning until 07/10/23)   Produce /a/ with average 85 dB over three sessions Baseline: Goal status: INITIAL   2.   pt will generate average 69dB when responding with single sentences, x 3 sessions  Baseline:  Goal status: INITIAL   3.  pt will generate 2 minutes simple conversation with 69dB average in 3 sessions  Baseline:  Goal status: INITIAL  4. Pt will demo knowledge of suboptimal speech volume with nonverbal cue by repeating last utterance 70% of the time in 3 sessions   Baseline:  Goal status: INITIAL  5.  Pt will undergo bedside swallow evaluation in first 3 sessions Baseline:  Goal  status: INITIAL     LONG TERM GOALS: Target date: 08/30/23   pt will produce average 69dB  in 7 minutes simple conversation in 3 sessions  Baseline:  Goal status: INITIAL   2.  pt will maintain upper 60s dB average in 10 minute simple conversation x 3 sessions  Baseline:  Goal status: INITIAL   3.  pt will report fewer requests to repeat herself than prior to ST in 3 sessions  Baseline:  Goal status: INITIAL   4. Pt will demo knowledge of suboptimal speech volume with nonverbal cue by repeating last utterance 80% of the time in 3 sessions   Baseline:  Goal status: INITIAL  4.  Pt will score higher/better on PROM in the last 2 weeks of therapy Baseline:  Goal status: INITIAL  ASSESSMENT:  CLINICAL IMPRESSION: Patient is a 89 y.o. F who was seen today for assessment of speech due to hypophonic voice in light of Parkinson's disease with sx for approx 4 years, according to pt. She tells SLP she has "resentment" due to thinking people were being rude to her telling her they cannot hear her and she is attempting to speak as clearly and loudly as possible. Today SLP learned during eval process that pt also has cognitive deficits significant enough for SLP to question if she would work best using SPEAKOUT therapy. A more traditional approach may need to be taken focusing on louder speech instead of following a highly structured therapy framework such as SPEAKOUT. Pt will  need to undergo BSE due to suspected  swallowing difficulties. OT evaluation should be considered and referring MD was notified.   OBJECTIVE IMPAIRMENTS: Objective impairments include attention, memory, dysarthria, and dysphagia. These impairments are limiting patient from managing medications, managing appointments, managing finances, household responsibilities, ADLs/IADLs, effectively communicating at home and in community, and safety when swallowing.Factors affecting potential to achieve goals and functional outcome are ability to learn/carryover information, cooperation/participation level, and severity of impairments.. Patient will benefit from skilled SLP services to address above impairments and improve overall function.  REHAB POTENTIAL: Fair to Good due to above factors  PLAN:  SLP FREQUENCY: 2x/week  SLP DURATION: 8 weeks  PLANNED INTERVENTIONS: Aspiration precaution training, Pharyngeal strengthening exercises, Diet toleration management , Language facilitation, Environmental controls, Trials of upgraded texture/liquids, Cueing hierachy, Cognitive reorganization, Internal/external aids, Oral motor exercises, Functional tasks, Multimodal communication approach, SLP instruction and feedback, Compensatory strategies, Patient/family education, [phone] Treatment of speech (30 or 45 min) , and [id number] Treatment of swallowing function    Maeson Purohit, CCC-SLP 07/10/2023, 2:58 PM

## 2023-06-13 ENCOUNTER — Non-Acute Institutional Stay: Payer: Self-pay | Admitting: Adult Health

## 2023-06-13 ENCOUNTER — Encounter: Payer: Self-pay | Admitting: Adult Health

## 2023-06-13 DIAGNOSIS — M810 Age-related osteoporosis without current pathological fracture: Secondary | ICD-10-CM | POA: Diagnosis not present

## 2023-06-13 DIAGNOSIS — M79641 Pain in right hand: Secondary | ICD-10-CM | POA: Diagnosis not present

## 2023-06-13 DIAGNOSIS — M1811 Unilateral primary osteoarthritis of first carpometacarpal joint, right hand: Secondary | ICD-10-CM | POA: Diagnosis not present

## 2023-06-13 MED ORDER — DOXYCYCLINE HYCLATE 100 MG PO TABS: 100.0000 mg | ORAL_TABLET | Freq: Two times a day (BID) | ORAL | Status: AC

## 2023-06-13 MED ORDER — VITAMIN D3 50 MCG (2000 UT) PO CAPS
2000.0000 [IU] | ORAL_CAPSULE | Freq: Every day | ORAL | Status: AC
Start: 2023-06-13 — End: ?

## 2023-06-13 NOTE — Progress Notes (Signed)
 Location:  Medical illustrator of Service:  ALF (13) Provider:   Janace Mckusick, ANP [facility] [phone]   Marguerite Shiley, MD  Patient Care Team: Marguerite Shiley, MD as PCP - General (Internal Medicine) Hugh Madura, MD as PCP - Cardiology (Cardiology) Glory Larsen, MD as Consulting Physician (Neurology) Prudy Brownie, DO as Consulting Physician (Pulmonary Disease) Zackery Herring, OD (Optometry) Cleda Curly as Physician Assistant (Physician Assistant)  Extended Emergency Contact Information Primary Emergency Contact: Rubie Corona, [place] [address] [place] Mobile Phone: [phone] Relation: Daughter  Code Status: DNR Goals of care: Advanced Directive information    06/12/2023    3:08 PM  Advanced Directives  Does Patient Have a Medical Advance Directive? Yes  Does patient want to make changes to medical advance directive? No - Patient declined     Chief Complaint  Patient presents with   Acute Visit    Right hand pain     HPI:  The patient is an 89 year old who presents with recurrence of right hand pain and swelling.  Initially presented on April 11th with right thumb swelling and redness, diagnosed as cellulitis, and treated with doxycycline  for 14 days, resulting in symptom improvement.  As of one day ago, she developed right hand pain and swelling again, which is worsening and causing significant discomfort. No fever or other constitutional symptoms. She has no history of gout or rheumatoid arthritis but does have mild osteoarthritis.  She regularly gets her nails done. Past Medical History:  Diagnosis Date   Aortic aneurysm (HCC)    Arthritis    OA BOTH KNEES AND HANDS   Atrial tachycardia (HCC)    HX OF   GERD (gastroesophageal reflux disease)    Glaucoma    left eye   Goiter    CAUSING COUGH, HOARSINESS AND DRY THROAT   Headache(784.0)    MIGRAINES     Hyperlipidemia    Neuralgia    Overactive bladder    RLS (restless legs syndrome)    Temporal arteritis (HCC)    Past Surgical History:  Procedure Laterality Date   ARTERY BIOPSY Right 07/21/2012   Procedure: BIOPSY TEMPORAL ARTERY;  Surgeon: Darcella Earnest, MD;  Location: [facility];  Service: General;  Laterality: Right;   CERVICAL FUSION  2012   Dr. Nigel Bart   EYE SURGERY     CATARACT EXTRACTION- BILATERAL   INCONTINENCE SURGERY     JOINT REPLACEMENT     right knee   LEFT SHOULDER SURGERY     RIGHT KNEE ARTHROSCOPY  11/2009     ROBOTIC ASSISTED BILATERAL SALPINGO OOPHERECTOMY Bilateral 12/08/2013   Procedure: ROBOTIC ASSISTED LAPAROSCOPIC BILATERAL SALPINGO OOPHORECTOMY WITH STAGING;  Surgeon: Alphonso Aschoff, MD;  Location: [facility];  Service: Gynecology;  Laterality: Bilateral;   TOTAL HIP ARTHROPLASTY Left 04/03/2022   Procedure: TOTAL HIP ARTHROPLASTY ANTERIOR APPROACH;  Surgeon: Saundra Curl, MD;  Location: [facility];  Service: Orthopedics;  Laterality: Left;   TOTAL KNEE ARTHROPLASTY  07/04/2011   Procedure: TOTAL KNEE ARTHROPLASTY;  Surgeon: Aurther Blue, MD;  Location: [facility];  Service: Orthopedics;  Laterality: Right;    Allergies  Allergen Reactions   Lisinopril  Other (See Comments) and Cough    Very low BP/"very tired" also   Mold Extract [Trichophyton] Other (See Comments)    Headaches and migraines   Molds & Smuts Other (See Comments)  Headaches and migraines   Sulfa Antibiotics    Codeine Nausea Only   Oxycodone  Nausea Only    Outpatient Encounter Medications as of 06/13/2023  Medication Sig   Cholecalciferol (VITAMIN D3) 50 MCG (2000 UT) capsule Take 1 capsule (2,000 Units total) by mouth daily.   doxycycline  (VIBRA -TABS) 100 MG tablet Take 1 tablet (100 mg total) by mouth 2 (two) times daily for 14 days.   brimonidine (ALPHAGAN) 0.2 % ophthalmic solution Place 1 drop into both eyes 2 (two) times daily.   carbidopa -levodopa  (SINEMET  IR) 25-100 MG tablet Take 0.5  tablets by mouth 2 (two) times daily. -Try to separate Sinemet  from food (especially protein-rich foods like meat, dairy, eggs) by about 30-60 mins - this will help the absorption of the medication. If you have some nausea with the medication, you can take it with some light food like crackers or ginger ale. Take in the morning and then late afternoon.   METROCREAM 0.75 % cream Apply 1 Application topically.   mirtazapine  (REMERON ) 15 MG tablet TAKE 1 TABLET BY MOUTH EVERYDAY AT BEDTIME   polyethylene glycol (MIRALAX  / GLYCOLAX ) 17 g packet Take 17 g by mouth daily.   rivastigmine  (EXELON ) 3 MG capsule Take 1 capsule (3 mg total) by mouth 2 (two) times daily.   rosuvastatin  (CRESTOR ) 10 MG tablet TAKE 1 TABLET BY MOUTH EVERY DAY   No facility-administered encounter medications on file as of 06/13/2023.    Review of Systems  Immunization History  Administered Date(s) Administered   Fluad Trivalent(High Dose 65+) 11/15/2022   Influenza Split 11/13/2012   Influenza, High Dose Seasonal PF 11/12/2016, 10/18/2017, 10/10/2018   Influenza,inj,quad, With Preservative 10/10/2018   Influenza-Unspecified 11/11/2008, 10/31/2009, 11/07/2011, 11/11/2013, 11/13/2016   Moderna Covid-19 Fall Seasonal Vaccine 2yrs & older 11/15/2022   Moderna Covid-19 Vaccine Bivalent Booster 72yrs & up 11/22/2021   PFIZER Comirnaty(Gray Top)Covid-19 Tri-Sucrose Vaccine 11/22/2021   Pneumococcal Conjugate-13 06/24/2013   Pneumococcal Polysaccharide-23 09/03/2014   Respiratory Syncytial Virus Vaccine,Recomb Aduvanted(Arexvy) 11/22/2021   Td 12/11/2017   Zoster Recombinant(Shingrix) 08/10/2016, 11/19/2016   Zoster, Live 06/24/2013   Zoster, Unspecified 11/19/2016   Pertinent  Health Maintenance Due  Topic Date Due   INFLUENZA VACCINE  09/13/2023   DEXA SCAN  Completed      12/19/2021    4:47 PM 01/16/2022    3:58 PM 07/24/2022    2:47 PM 10/01/2022    1:09 PM 11/27/2022    1:25 PM  Fall Risk  Falls in the past year?  0 0 0 1 0  Was there an injury with Fall? 0 0 0 1 0  Fall Risk Category Calculator 0 0 0 2 0  Fall Risk Category (Retired) Low Low     (RETIRED) Patient Fall Risk Level Low fall risk Low fall risk     Patient at Risk for Falls Due to No Fall Risks No Fall Risks No Fall Risks History of fall(s) No Fall Risks  Fall risk Follow up Falls evaluation completed Falls evaluation completed Falls evaluation completed Falls evaluation completed Falls evaluation completed   Functional Status Survey:    Vitals:   06/13/23 1629  BP: 128/82  Pulse: 90  Resp: 20  Temp: 98.7 F (37.1 C)   There is no height or weight on file to calculate BMI. Physical Exam Vitals and nursing note reviewed.  Constitutional:      Appearance: Normal appearance.  Musculoskeletal:        General: Swelling present.  Comments: +CMS to RUE No red streaking Right hand dorsum with warmth, redness, and swelling at the metacarpal area.  Mild swelling to the right thumb Area is very tender.  No open areas or drainage.   Skin:    General: Skin is warm and dry.     Findings: Erythema present.  Neurological:     Mental Status: She is alert.     Labs reviewed: Recent Labs    11/20/22 0000  NA 139  139  K 4.3  4.3  CL 102  102  CO2 24*  24*  BUN 25*  25*  CREATININE 0.8  0.8  CALCIUM  9.5  9.5   Recent Labs    11/20/22 0000  AST 28  28  ALT 23  23  ALKPHOS 110  110  ALBUMIN 4.4  4.4   Recent Labs    11/20/22 0000  WBC 4.8  HGB 13.5  HCT 40  PLT 191   Lab Results  Component Value Date   TSH 1.20 11/20/2022   Lab Results  Component Value Date   HGBA1C 5.4 05/21/2014   Lab Results  Component Value Date   CHOL 156 11/20/2022   HDL 64 11/20/2022   LDLCALC 81 11/20/2022   TRIG 52 11/20/2022   CHOLHDL 2.0 03/18/2020    Significant Diagnostic Results in last 30 days:  DG Foot Complete Right Result Date: 06/12/2023 Please see detailed radiograph report in office  note.   Assessment/Plan  Right hand pain Recurrent right hand pain and swelling, likely cellulitis. Previous doxycycline  response suggests infection. Differential includes cellulitis, gout, inflammatory arthritis.  - Prescribe doxycycline  for 14 days. - Apply ice to right hand. - Order x-ray of right hand. - Check uric acid level and CBC. - Advise thorough hand washing. - Recommend avoiding nail salon.   Total time :  time greater than 50% of total time spent doing pt counseling and coordination of care

## 2023-06-14 DIAGNOSIS — R2241 Localized swelling, mass and lump, right lower limb: Secondary | ICD-10-CM | POA: Diagnosis not present

## 2023-06-14 LAB — CBC AND DIFFERENTIAL
HCT: 37 (ref 36–46)
Hemoglobin: 12.4 (ref 12.0–16.0)
Platelets: 142 K/uL — AB (ref 150–400)
WBC: 4.2

## 2023-06-14 LAB — CBC: RBC: 4 (ref 3.87–5.11)

## 2023-06-14 MED ORDER — TRAMADOL HCL 50 MG PO TABS
25.0000 mg | ORAL_TABLET | Freq: Two times a day (BID) | ORAL | 0 refills | Status: AC | PRN
Start: 2023-06-14 — End: 2023-06-28

## 2023-06-14 NOTE — Addendum Note (Signed)
 Addended by: Raylene Calamity L on: 06/14/2023 12:00 PM   Modules accepted: Orders

## 2023-06-17 ENCOUNTER — Ambulatory Visit: Admitting: Podiatry

## 2023-06-18 ENCOUNTER — Ambulatory Visit: Attending: Neurology

## 2023-06-18 DIAGNOSIS — R1310 Dysphagia, unspecified: Secondary | ICD-10-CM | POA: Insufficient documentation

## 2023-06-18 DIAGNOSIS — R262 Difficulty in walking, not elsewhere classified: Secondary | ICD-10-CM | POA: Insufficient documentation

## 2023-06-18 DIAGNOSIS — R2681 Unsteadiness on feet: Secondary | ICD-10-CM | POA: Diagnosis not present

## 2023-06-18 DIAGNOSIS — M6281 Muscle weakness (generalized): Secondary | ICD-10-CM | POA: Insufficient documentation

## 2023-06-18 DIAGNOSIS — R471 Dysarthria and anarthria: Secondary | ICD-10-CM | POA: Insufficient documentation

## 2023-06-18 DIAGNOSIS — R41841 Cognitive communication deficit: Secondary | ICD-10-CM | POA: Diagnosis present

## 2023-06-18 DIAGNOSIS — R2689 Other abnormalities of gait and mobility: Secondary | ICD-10-CM | POA: Insufficient documentation

## 2023-06-18 NOTE — Therapy (Signed)
 OUTPATIENT PHYSICAL THERAPY NEURO TREATMENT   Patient Name: Janet Wilson MRN: [id number] DOB:Nov 23, 1934, 89 y.o., female Today's Date: 06/18/2023   PCP: Marguerite Shiley, MD REFERRING PROVIDER: Glory Larsen, MD  END OF SESSION:  PT End of Session - 06/18/23 1313     Visit Number 2    Number of Visits 13    Date for PT Re-Evaluation 07/24/23    Authorization Type [employer]    PT Start Time 1315    PT Stop Time 1400    PT Time Calculation (min) 45 min             Past Medical History:  Diagnosis Date   Aortic aneurysm (HCC)    Arthritis    OA BOTH KNEES AND HANDS   Atrial tachycardia (HCC)    HX OF   GERD (gastroesophageal reflux disease)    Glaucoma    left eye   Goiter    CAUSING COUGH, HOARSINESS AND DRY THROAT   Headache(784.0)    MIGRAINES    Hyperlipidemia    Neuralgia    Overactive bladder    RLS (restless legs syndrome)    Temporal arteritis (HCC)    Past Surgical History:  Procedure Laterality Date   ARTERY BIOPSY Right 07/21/2012   Procedure: BIOPSY TEMPORAL ARTERY;  Surgeon: Darcella Earnest, MD;  Location: [facility];  Service: General;  Laterality: Right;   CERVICAL FUSION  2012   Dr. Nigel Bart   EYE SURGERY     CATARACT EXTRACTION- BILATERAL   INCONTINENCE SURGERY     JOINT REPLACEMENT     right knee   LEFT SHOULDER SURGERY     RIGHT KNEE ARTHROSCOPY  11/2009     ROBOTIC ASSISTED BILATERAL SALPINGO OOPHERECTOMY Bilateral 12/08/2013   Procedure: ROBOTIC ASSISTED LAPAROSCOPIC BILATERAL SALPINGO OOPHORECTOMY WITH STAGING;  Surgeon: Alphonso Aschoff, MD;  Location: [facility];  Service: Gynecology;  Laterality: Bilateral;   TOTAL HIP ARTHROPLASTY Left 04/03/2022   Procedure: TOTAL HIP ARTHROPLASTY ANTERIOR APPROACH;  Surgeon: Saundra Curl, MD;  Location: [facility];  Service: Orthopedics;  Laterality: Left;   TOTAL KNEE ARTHROPLASTY  07/04/2011   Procedure: TOTAL KNEE ARTHROPLASTY;  Surgeon: Aurther Blue, MD;  Location: [facility];  Service: Orthopedics;  Laterality: Right;   Patient Active Problem List   Diagnosis Date Noted   Dysphagia 07/24/2022   Flatulence, eructation and gas pain 07/24/2022   Hip pain 07/24/2022   Left lower quadrant pain 07/24/2022   Aortic valve disease 04/04/2022   Closed left hip fracture (HCC) 04/02/2022   Aortic aneurysm (HCC) 04/02/2022   Dementia (HCC) 01/21/2022   Post-nasal drip 12/20/2021   Gastroesophageal reflux disease without esophagitis 12/20/2021   Depression with anxiety 12/20/2021   Cellulitis of right leg 12/20/2021   Primary insomnia 12/20/2021   Hyperlipidemia 12/20/2021   Conjunctivitis, chronic, follicular 03/06/2021   Osteoarthritis of right glenohumeral joint 04/04/2020   Coronary artery calcification 12/18/2019   Aortic atherosclerosis (HCC) 12/18/2019   Pure hypercholesterolemia 12/18/2019   Tear of lateral meniscus of knee 01/21/2019   Senile osteopenia 03/20/2018   H/O temporal arteritis 03/20/2018   Memory loss, short term 03/20/2018   Weight loss 03/20/2018   Nausea 03/20/2018   Mixed urge and stress incontinence 03/20/2018   Multinodular goiter (nontoxic) 03/20/2018   RLS (restless legs syndrome) 12/19/2017   Pain in right knee 10/09/2017   Ectopic atrial tachycardia (HCC) 10/08/2016   Cough 09/06/2015   Hoarseness of voice 09/06/2015  Rhinitis, chronic 09/06/2015   Bilateral occipital neuralgia 06/16/2015   Convergence insufficiency 08/09/2014   Neck pain 03/05/2013   Capsular glaucoma with pseudoexfoliation of lens, left eye, moderate stage 12/22/2012   Right temporal headache 06/30/2012   Family history of coronary artery disease 09/24/2011   S/P TKR (total knee replacement) 09/24/2011   Tachycardia 08/10/2011   OA (osteoarthritis) of knee 07/04/2011    ONSET DATE: "several months ago"  REFERRING DIAG: G20.A1 (ICD-10-CM) - Parkinson disease, symptomatic (HCC) R26.9 (ICD-10-CM) - Gait disorder W19.Benny Braver (ICD-10-CM) - Fall, initial  encounter  THERAPY DIAG:  Difficulty in walking, not elsewhere classified  Muscle weakness (generalized)  Other abnormalities of gait and mobility  Unsteadiness on feet  Rationale for Evaluation and Treatment: Rehabilitation  SUBJECTIVE:                                                                                                                                                                                             SUBJECTIVE STATEMENT: Hasn't been able to go to boxing due to infection in fingers.   Pt accompanied by: self  PERTINENT HISTORY: is going to assisted living at [facility]. She fell she tripped over a chandelier overnight. She was going to bed. No freezing episodes, shuffling a little bit more  PAIN:  Are you having pain?  Notes pain in left knee and right shoulder  PRECAUTIONS: Fall  RED FLAGS: None   WEIGHT BEARING RESTRICTIONS: No  FALLS: Has patient fallen in last 6 months? Yes. Number of falls broke her left hip in a fall x 1 year ago. One fall in past 6 moths  LIVING ENVIRONMENT: Lives with: lives alone Lives in: Other [facility] in ALF, assist for medication, and supervision for bathing Stairs:    Has following equipment at home: Single point cane and Environmental consultant - 4 wheeled  PLOF: Independent with community mobility with device and Needs assistance with homemaking  PATIENT GOALS:   OBJECTIVE:   TODAY'S TREATMENT: 06/18/23 Activity Comments  TUG test 27 sec w/ 4WW 19 sec w/out AD 17 sec w/out AD  3 Meter Backwards Walk Test 12 sec 8.38 sec 4.8 sec Average: 8.4 sec  Pt education regarding exercise intervention as it pertains to PD   Seated PWR moves 5 min tutorial video with demo 1x10 reps with visual demo and cues for increased speed and sequence.           Note: Objective measures were completed at Evaluation unless otherwise noted.  DIAGNOSTIC FINDINGS:   COGNITION: Overall cognitive status:  unable to perform 3-item recall after  1-2 min, requiring prompts 2/3 recall  SENSATION: WFL  COORDINATION: Difficulty with rapid alternating movement Heel to shin grossly impaired Finger to nose WNL    MUSCLE TONE: increased tone to RUE flexors and right hamstrings    POSTURE: rounded shoulders, forward head, and increased thoracic kyphosis  LOWER EXTREMITY ROM:     Active  Right Eval Left Eval  Hip flexion    Hip extension    Hip abduction    Hip adduction    Hip internal rotation    Hip external rotation    Knee flexion 120 120  Knee extension -5 0  Ankle dorsiflexion 15 15  Ankle plantarflexion    Ankle inversion    Ankle eversion     (Blank rows = not tested)  LOWER EXTREMITY MMT:    4/5 gross BLE  BED MOBILITY:  Not tested, reports modified independence use bed rails  TRANSFERS: independent    CURB:  Findings: CGA  STAIRS: Not tested GAIT: Findings: Distance walked:   and Comments: narrow BOS, 5-6 steps for turning  FUNCTIONAL TESTS:  5 times sit to stand: 12 sec--difficulty with full upright posture/extension Mini-BESTest: 16/28 3 Meter Backwards Walk Test: NT 10 Meter Walk Test: 16 sec - 2.0 ft/sec                                                                                                                                 TREATMENT DATE:     PATIENT EDUCATION: Education details: assessment details, rationale of PT intervention Person educated: Patient Education method: Explanation Education comprehension: verbalized understanding  HOME EXERCISE PROGRAM: TBD  GOALS: Goals reviewed with patient? Yes  SHORT TERM GOALS: Target date: 07/03/2023    Patient will be independent in HEP to improve functional outcomes Baseline: Goal status: INITIAL    LONG TERM GOALS: Target date: 07/24/2023    Demo improved motor control and reduced risk for falls per score 24/28 Mini-BESTest Baseline: 16/28 Goal status: INITIAL  2.  Perform 3 Meter Backwards Walk Test in 4.8  sec to reduce risk for falls Baseline: 8.4 sec Goal status: INITIAL  3.  Teach-back relevant strategies for exercise interventions as it pertains to PD Baseline:  Goal status: INITIAL  4.  Maintain gait speed of 2.8 ft/sec to improve efficiency of community ambulation Baseline: 2.0 ft/sec Goal status: INITIAL    ASSESSMENT:  CLINICAL IMPRESSION: Initiated session with trials of TUG test with and without AD demonstrating increased times and increased risk for falls per time constraint. 3 Meter Backwards Walk Test three trials with average time of 8.4 sec indicating increased risk for falls.  Education regaridng exercise intervention as it pertains to PD and referenced cycling HIIT "Pedaling for Parkinson's Protocol". Initiated seated large amplitude movements to improve coordination, flexibility and postural awareness requiring slower, guided rehearsal.  Provided instruction for home performance. Continued sessions to progress POC details to improve mobility and reduce risk for falls  OBJECTIVE IMPAIRMENTS: Abnormal gait, decreased activity  tolerance, decreased balance, decreased coordination, decreased mobility, difficulty walking, decreased strength, impaired tone, and postural dysfunction.   ACTIVITY LIMITATIONS: carrying, lifting, bending, squatting, transfers, and locomotion level  PARTICIPATION LIMITATIONS: community activity and exercise classes  PERSONAL FACTORS: Age, Time since onset of injury/illness/exacerbation, and 3+ comorbidities: PMH  are also affecting patient's functional outcome.   REHAB POTENTIAL: Good  CLINICAL DECISION MAKING: Evolving/moderate complexity  EVALUATION COMPLEXITY: Moderate  PLAN:  PT FREQUENCY: 1-2x/week  PT DURATION: 6 weeks  PLANNED INTERVENTIONS: 97750- Physical Performance Testing, 97110-Therapeutic exercises, 97530- Therapeutic activity, [id number]- Neuromuscular re-education, 97535- Self Care, [id number]- Manual therapy, [id number]- Gait training,  [phone]- Canalith repositioning, and [id number]- Aquatic Therapy  PLAN FOR NEXT SESSION: review seated PWR moves, progress to standing PWR moves.   1:14 PM, 06/18/23 M. Kelly Corydon Schweiss, PT, DPT Physical Therapist- [facility] Office Number: [phone]

## 2023-06-25 NOTE — Therapy (Signed)
 OUTPATIENT PHYSICAL THERAPY NEURO TREATMENT   Patient Name: Janet Wilson MRN: [id number] DOB:1935/01/12, 89 y.o., female Today's Date: 06/26/2023   PCP: Marguerite Shiley, MD REFERRING PROVIDER: Glory Larsen, MD  END OF SESSION:  PT End of Session - 06/26/23 1319     Visit Number 3    Number of Visits 13    Date for PT Re-Evaluation 07/24/23    Authorization Type [employer]    PT Start Time 1227    PT Stop Time 1315    PT Time Calculation (min) 48 min    Equipment Utilized During Treatment Gait belt    Activity Tolerance Patient tolerated treatment well    Behavior During Therapy WFL for tasks assessed/performed              Past Medical History:  Diagnosis Date   Aortic aneurysm (HCC)    Arthritis    OA BOTH KNEES AND HANDS   Atrial tachycardia (HCC)    HX OF   GERD (gastroesophageal reflux disease)    Glaucoma    left eye   Goiter    CAUSING COUGH, HOARSINESS AND DRY THROAT   Headache(784.0)    MIGRAINES    Hyperlipidemia    Neuralgia    Overactive bladder    RLS (restless legs syndrome)    Temporal arteritis (HCC)    Past Surgical History:  Procedure Laterality Date   ARTERY BIOPSY Right 07/21/2012   Procedure: BIOPSY TEMPORAL ARTERY;  Surgeon: Darcella Earnest, MD;  Location: [facility];  Service: General;  Laterality: Right;   CERVICAL FUSION  2012   Dr. Nigel Bart   EYE SURGERY     CATARACT EXTRACTION- BILATERAL   INCONTINENCE SURGERY     JOINT REPLACEMENT     right knee   LEFT SHOULDER SURGERY     RIGHT KNEE ARTHROSCOPY  11/2009     ROBOTIC ASSISTED BILATERAL SALPINGO OOPHERECTOMY Bilateral 12/08/2013   Procedure: ROBOTIC ASSISTED LAPAROSCOPIC BILATERAL SALPINGO OOPHORECTOMY WITH STAGING;  Surgeon: Alphonso Aschoff, MD;  Location: [facility];  Service: Gynecology;  Laterality: Bilateral;   TOTAL HIP ARTHROPLASTY Left 04/03/2022   Procedure: TOTAL HIP ARTHROPLASTY ANTERIOR APPROACH;  Surgeon: Saundra Curl, MD;  Location: [facility];  Service: Orthopedics;  Laterality: Left;   TOTAL KNEE ARTHROPLASTY  07/04/2011   Procedure: TOTAL KNEE ARTHROPLASTY;  Surgeon: Aurther Blue, MD;  Location: [facility];  Service: Orthopedics;  Laterality: Right;   Patient Active Problem List   Diagnosis Date Noted   Dysphagia 07/24/2022   Flatulence, eructation and gas pain 07/24/2022   Hip pain 07/24/2022   Left lower quadrant pain 07/24/2022   Aortic valve disease 04/04/2022   Closed left hip fracture (HCC) 04/02/2022   Aortic aneurysm (HCC) 04/02/2022   Dementia (HCC) 01/21/2022   Post-nasal drip 12/20/2021   Gastroesophageal reflux disease without esophagitis 12/20/2021   Depression with anxiety 12/20/2021   Cellulitis of right leg 12/20/2021   Primary insomnia 12/20/2021   Hyperlipidemia 12/20/2021   Conjunctivitis, chronic, follicular 03/06/2021   Osteoarthritis of right glenohumeral joint 04/04/2020   Coronary artery calcification 12/18/2019   Aortic atherosclerosis (HCC) 12/18/2019   Pure hypercholesterolemia 12/18/2019   Tear of lateral meniscus of knee 01/21/2019   Senile osteopenia 03/20/2018   H/O temporal arteritis 03/20/2018   Memory loss, short term 03/20/2018   Weight loss 03/20/2018   Nausea 03/20/2018   Mixed urge and stress incontinence 03/20/2018   Multinodular goiter (nontoxic) 03/20/2018   RLS (restless  legs syndrome) 12/19/2017   Pain in right knee 10/09/2017   Ectopic atrial tachycardia (HCC) 10/08/2016   Cough 09/06/2015   Hoarseness of voice 09/06/2015   Rhinitis, chronic 09/06/2015   Bilateral occipital neuralgia 06/16/2015   Convergence insufficiency 08/09/2014   Neck pain 03/05/2013   Capsular glaucoma with pseudoexfoliation of lens, left eye, moderate stage 12/22/2012   Right temporal headache 06/30/2012   Family history of coronary artery disease 09/24/2011   S/P TKR (total knee replacement) 09/24/2011   Tachycardia 08/10/2011   OA (osteoarthritis) of knee 07/04/2011    ONSET DATE:  "several months ago"  REFERRING DIAG: G20.A1 (ICD-10-CM) - Parkinson disease, symptomatic (HCC) R26.9 (ICD-10-CM) - Gait disorder W19.Benny Braver (ICD-10-CM) - Fall, initial encounter  THERAPY DIAG:  Difficulty in walking, not elsewhere classified  Muscle weakness (generalized)  Other abnormalities of gait and mobility  Unsteadiness on feet  Rationale for Evaluation and Treatment: Rehabilitation  SUBJECTIVE:                                                                                                                                                                                             SUBJECTIVE STATEMENT: I left my walker at home. Can I use this one? I had an infection in my hand- the pain was a 10. Reports that it is better, still sore in the R thumb.  Pt accompanied by: self  PERTINENT HISTORY: is going to assisted living at [facility]. She fell she tripped over a chandelier overnight. She was going to bed. No freezing episodes, shuffling a little bit more  PAIN:  Are you having pain? Yes: NPRS scale: 2/10 Pain location: R thumb Pain description: sore Aggravating factors: "touching" Relieving factors: heat  PRECAUTIONS: Fall  RED FLAGS: None   WEIGHT BEARING RESTRICTIONS: No  FALLS: Has patient fallen in last 6 months? Yes. Number of falls broke her left hip in a fall x 1 year ago. One fall in past 6 moths  LIVING ENVIRONMENT: Lives with: lives alone Lives in: Other [facility] in ALF, assist for medication, and supervision for bathing Stairs:   Has following equipment at home: Single point cane and Environmental consultant - 4 wheeled  PLOF: Independent with community mobility with device and Needs assistance with homemaking  PATIENT GOALS:   OBJECTIVE:    TODAY'S TREATMENT: 06/26/23 Activity Comments  review of seated PWR moves: Up 10x Rock 10x Twist 10x Step 10x  Great carryover and effort; occasional cueing to incorporate LE movements; Tendency to flex forward    standing PWR moves in II bars: Up 10x Rock 10x Twist 10x Step 10x  Mirror feedback and  chair nearby but pt did not require UE support; cueing to avoid movements from running together; some L LE wt shift avoidance d/t c/o L knee pain. Tendency to flex forward   STS with green medball 10x  Leaning away from L knee, cues to stand fully upright and maintain wider BOS  sitting HS stretch 2x30"  Good tolerance   sidestepping over hurdles At counter; cues for foot positioning to allow for hurdle clearance        HOME EXERCISE PROGRAM Last updated: 06/26/23 Access Code: [email or web address] URL: [email or web address] Date: 06/26/2023 Prepared by: [facility] - Outpatient  Rehab - [facility]  Exercises - Sit to Stand with Arms Crossed  - 1 x daily - 5 x weekly - 2 sets - 10 reps - Seated Hamstring Stretch  - 1 x daily - 5 x weekly - 2 sets - 30 sec hold  Standing PWR moves 10x each, 1-2x/day at counter top     PATIENT EDUCATION: Education details: handout on standing PWR moves and edu for safety and HEP update Person educated: Patient Education method: Explanation, Demonstration, Tactile cues, Verbal cues, and Handouts Education comprehension: verbalized understanding and returned demonstration    Note: Objective measures were completed at Evaluation unless otherwise noted.  DIAGNOSTIC FINDINGS:   COGNITION: Overall cognitive status: unable to perform 3-item recall after 1-2 min, requiring prompts 2/3 recall   SENSATION: WFL  COORDINATION: Difficulty with rapid alternating movement Heel to shin grossly impaired Finger to nose WNL    MUSCLE TONE: increased tone to RUE flexors and right hamstrings    POSTURE: rounded shoulders, forward head, and increased thoracic kyphosis  LOWER EXTREMITY ROM:     Active  Right Eval Left Eval  Hip flexion    Hip extension    Hip abduction    Hip adduction    Hip internal rotation    Hip external rotation    Knee flexion  120 120  Knee extension -5 0  Ankle dorsiflexion 15 15  Ankle plantarflexion    Ankle inversion    Ankle eversion     (Blank rows = not tested)  LOWER EXTREMITY MMT:    4/5 gross BLE  BED MOBILITY:  Not tested, reports modified independence use bed rails  TRANSFERS: independent    CURB:  Findings: CGA  STAIRS: Not tested GAIT: Findings: Distance walked:   and Comments: narrow BOS, 5-6 steps for turning  FUNCTIONAL TESTS:  5 times sit to stand: 12 sec--difficulty with full upright posture/extension Mini-BESTest: 16/28 3 Meter Backwards Walk Test: NT 10 Meter Walk Test: 16 sec - 2.0 ft/sec                                                                                                                                 TREATMENT DATE:     PATIENT EDUCATION: Education details: assessment details, rationale of PT intervention Person educated: Patient Education method: Explanation Education comprehension: verbalized  understanding  HOME EXERCISE PROGRAM: TBD  GOALS: Goals reviewed with patient? Yes  SHORT TERM GOALS: Target date: 07/03/2023    Patient will be independent in HEP to improve functional outcomes Baseline: Goal status: IN PROGRESS    LONG TERM GOALS: Target date: 07/24/2023    Demo improved motor control and reduced risk for falls per score 24/28 Mini-BESTest Baseline: 16/28 Goal status: IN PROGRESS  2.  Perform 3 Meter Backwards Walk Test in 4.8 sec to reduce risk for falls Baseline: 8.4 sec Goal status: IN PROGRESS  3.  Teach-back relevant strategies for exercise interventions as it pertains to PD Baseline:  Goal status: IN PROGRESS  4.  Maintain gait speed of 2.8 ft/sec to improve efficiency of community ambulation Baseline: 2.0 ft/sec Goal status: IN PROGRESS    ASSESSMENT:  CLINICAL IMPRESSION: Patient arrived to session with some mild remaining pain in the thumb s/p report of infection in her hand. Reviewed PWR moves in  sitting and progressed to standing. Great ability to maintain stability without need for UE support with standing PWR moves. Cueing provided for more distinct movements, taller posture, and proper alignment. Additional exercises added into HEP for functional strengthening and HS length. Pt reported good benefit from session today.   OBJECTIVE IMPAIRMENTS: Abnormal gait, decreased activity tolerance, decreased balance, decreased coordination, decreased mobility, difficulty walking, decreased strength, impaired tone, and postural dysfunction.   ACTIVITY LIMITATIONS: carrying, lifting, bending, squatting, transfers, and locomotion level  PARTICIPATION LIMITATIONS: community activity and exercise classes  PERSONAL FACTORS: Age, Time since onset of injury/illness/exacerbation, and 3+ comorbidities: PMH are also affecting patient's functional outcome.   REHAB POTENTIAL: Good  CLINICAL DECISION MAKING: Evolving/moderate complexity  EVALUATION COMPLEXITY: Moderate  PLAN:  PT FREQUENCY: 1-2x/week  PT DURATION: 6 weeks  PLANNED INTERVENTIONS: 97750- Physical Performance Testing, 97110-Therapeutic exercises, 97530- Therapeutic activity, 97112- Neuromuscular re-education, 97535- Self Care, 30865- Manual therapy, [phone]- Gait training, [phone]- Canalith repositioning, and [phone]- Aquatic Therapy  PLAN FOR NEXT SESSION: progress to standing PWR moves FLOW; work on posture, speed of movement    Thaddeus Filippo, PT, DPT 06/26/23 1:21 PM  [facility] Re[facility] at [facility] [address], [address] [place], [place] [address] Phone # [phone] Fax # [phone]

## 2023-06-26 ENCOUNTER — Encounter: Payer: Self-pay | Admitting: Physical Therapy

## 2023-06-26 ENCOUNTER — Ambulatory Visit: Admitting: Physical Therapy

## 2023-06-26 ENCOUNTER — Ambulatory Visit: Admitting: Occupational Therapy

## 2023-06-26 ENCOUNTER — Ambulatory Visit

## 2023-06-26 DIAGNOSIS — R1310 Dysphagia, unspecified: Secondary | ICD-10-CM | POA: Diagnosis not present

## 2023-06-26 DIAGNOSIS — R471 Dysarthria and anarthria: Secondary | ICD-10-CM | POA: Diagnosis not present

## 2023-06-26 DIAGNOSIS — R2689 Other abnormalities of gait and mobility: Secondary | ICD-10-CM | POA: Diagnosis not present

## 2023-06-26 DIAGNOSIS — R2681 Unsteadiness on feet: Secondary | ICD-10-CM

## 2023-06-26 DIAGNOSIS — R262 Difficulty in walking, not elsewhere classified: Secondary | ICD-10-CM | POA: Diagnosis not present

## 2023-06-26 DIAGNOSIS — M6281 Muscle weakness (generalized): Secondary | ICD-10-CM | POA: Diagnosis not present

## 2023-07-01 ENCOUNTER — Ambulatory Visit

## 2023-07-01 DIAGNOSIS — R2689 Other abnormalities of gait and mobility: Secondary | ICD-10-CM

## 2023-07-01 DIAGNOSIS — R2681 Unsteadiness on feet: Secondary | ICD-10-CM

## 2023-07-01 DIAGNOSIS — M6281 Muscle weakness (generalized): Secondary | ICD-10-CM | POA: Diagnosis not present

## 2023-07-01 DIAGNOSIS — R262 Difficulty in walking, not elsewhere classified: Secondary | ICD-10-CM

## 2023-07-01 DIAGNOSIS — R1310 Dysphagia, unspecified: Secondary | ICD-10-CM | POA: Diagnosis not present

## 2023-07-01 DIAGNOSIS — R471 Dysarthria and anarthria: Secondary | ICD-10-CM | POA: Diagnosis not present

## 2023-07-01 NOTE — Therapy (Signed)
 OUTPATIENT PHYSICAL THERAPY NEURO TREATMENT   Patient Name: Janet Wilson MRN: [id number] DOB:06-04-1934, 89 y.o., female Today's Date: 07/01/2023   PCP: Marguerite Shiley, MD REFERRING PROVIDER: Glory Larsen, MD  END OF SESSION:  PT End of Session - 07/01/23 1137     Visit Number 4    Number of Visits 13    Date for PT Re-Evaluation 07/24/23    Authorization Type [employer] Medicare    Progress Note Due on Visit 10    PT Start Time 1145    PT Stop Time 1230    PT Time Calculation (min) 45 min    Equipment Utilized During Treatment Gait belt    Activity Tolerance Patient tolerated treatment well    Behavior During Therapy WFL for tasks assessed/performed              Past Medical History:  Diagnosis Date   Aortic aneurysm (HCC)    Arthritis    OA BOTH KNEES AND HANDS   Atrial tachycardia (HCC)    HX OF   GERD (gastroesophageal reflux disease)    Glaucoma    left eye   Goiter    CAUSING COUGH, HOARSINESS AND DRY THROAT   Headache(784.0)    MIGRAINES    Hyperlipidemia    Neuralgia    Overactive bladder    RLS (restless legs syndrome)    Temporal arteritis (HCC)    Past Surgical History:  Procedure Laterality Date   ARTERY BIOPSY Right 07/21/2012   Procedure: BIOPSY TEMPORAL ARTERY;  Surgeon: Darcella Earnest, MD;  Location: [facility];  Service: General;  Laterality: Right;   CERVICAL FUSION  2012   Dr. Nigel Bart   EYE SURGERY     CATARACT EXTRACTION- BILATERAL   INCONTINENCE SURGERY     JOINT REPLACEMENT     right knee   LEFT SHOULDER SURGERY     RIGHT KNEE ARTHROSCOPY  11/2009     ROBOTIC ASSISTED BILATERAL SALPINGO OOPHERECTOMY Bilateral 12/08/2013   Procedure: ROBOTIC ASSISTED LAPAROSCOPIC BILATERAL SALPINGO OOPHORECTOMY WITH STAGING;  Surgeon: Alphonso Aschoff, MD;  Location: [facility];  Service: Gynecology;  Laterality: Bilateral;   TOTAL HIP ARTHROPLASTY Left 04/03/2022   Procedure: TOTAL HIP ARTHROPLASTY ANTERIOR APPROACH;  Surgeon:  Saundra Curl, MD;  Location: [facility];  Service: Orthopedics;  Laterality: Left;   TOTAL KNEE ARTHROPLASTY  07/04/2011   Procedure: TOTAL KNEE ARTHROPLASTY;  Surgeon: Aurther Blue, MD;  Location: [facility];  Service: Orthopedics;  Laterality: Right;   Patient Active Problem List   Diagnosis Date Noted   Dysphagia 07/24/2022   Flatulence, eructation and gas pain 07/24/2022   Hip pain 07/24/2022   Left lower quadrant pain 07/24/2022   Aortic valve disease 04/04/2022   Closed left hip fracture (HCC) 04/02/2022   Aortic aneurysm (HCC) 04/02/2022   Dementia (HCC) 01/21/2022   Post-nasal drip 12/20/2021   Gastroesophageal reflux disease without esophagitis 12/20/2021   Depression with anxiety 12/20/2021   Cellulitis of right leg 12/20/2021   Primary insomnia 12/20/2021   Hyperlipidemia 12/20/2021   Conjunctivitis, chronic, follicular 03/06/2021   Osteoarthritis of right glenohumeral joint 04/04/2020   Coronary artery calcification 12/18/2019   Aortic atherosclerosis (HCC) 12/18/2019   Pure hypercholesterolemia 12/18/2019   Tear of lateral meniscus of knee 01/21/2019   Senile osteopenia 03/20/2018   H/O temporal arteritis 03/20/2018   Memory loss, short term 03/20/2018   Weight loss 03/20/2018   Nausea 03/20/2018   Mixed urge and stress incontinence 03/20/2018  Multinodular goiter (nontoxic) 03/20/2018   RLS (restless legs syndrome) 12/19/2017   Pain in right knee 10/09/2017   Ectopic atrial tachycardia (HCC) 10/08/2016   Cough 09/06/2015   Hoarseness of voice 09/06/2015   Rhinitis, chronic 09/06/2015   Bilateral occipital neuralgia 06/16/2015   Convergence insufficiency 08/09/2014   Neck pain 03/05/2013   Capsular glaucoma with pseudoexfoliation of lens, left eye, moderate stage 12/22/2012   Right temporal headache 06/30/2012   Family history of coronary artery disease 09/24/2011   S/P TKR (total knee replacement) 09/24/2011   Tachycardia 08/10/2011   OA (osteoarthritis)  of knee 07/04/2011    ONSET DATE: "several months ago"  REFERRING DIAG: G20.A1 (ICD-10-CM) - Parkinson disease, symptomatic (HCC) R26.9 (ICD-10-CM) - Gait disorder W19.Benny Braver (ICD-10-CM) - Fall, initial encounter  THERAPY DIAG:  Difficulty in walking, not elsewhere classified  Muscle weakness (generalized)  Other abnormalities of gait and mobility  Unsteadiness on feet  Rationale for Evaluation and Treatment: Rehabilitation  SUBJECTIVE:                                                                                                                                                                                             SUBJECTIVE STATEMENT: I left my walker at home. Can I use this one? I had an infection in my hand- the pain was a 10. Reports that it is better, still sore in the R thumb.  Pt accompanied by: self  PERTINENT HISTORY: is going to assisted living at [facility]. She fell she tripped over a chandelier overnight. She was going to bed. No freezing episodes, shuffling a little bit more  PAIN:  Are you having pain? Yes: NPRS scale: 2/10 Pain location: R thumb Pain description: sore Aggravating factors: "touching" Relieving factors: heat  PRECAUTIONS: Fall  RED FLAGS: None   WEIGHT BEARING RESTRICTIONS: No  FALLS: Has patient fallen in last 6 months? Yes. Number of falls broke her left hip in a fall x 1 year ago. One fall in past 6 moths  LIVING ENVIRONMENT: Lives with: lives alone Lives in: Other [facility] in ALF, assist for medication, and supervision for bathing Stairs:   Has following equipment at home: Single point cane and Environmental consultant - 4 wheeled  PLOF: Independent with community mobility with device and Needs assistance with homemaking  PATIENT GOALS:   OBJECTIVE:    TODAY'S TREATMENT: 07/01/23 Activity Comments  Seated PWR moves Demo of videos for guided practice at home  Dynamic balance -Retro-walk--cues for large ROM -Fast forward 4x45 -tandem  walk w/ cognitive dual tasks   Static multisensory balance W/ anticipatory and reactive demands  Righting reactions  TODAY'S TREATMENT: 06/26/23 Activity Comments  review of seated PWR moves: Up 10x Rock 10x Twist 10x Step 10x  Great carryover and effort; occasional cueing to incorporate LE movements; Tendency to flex forward   standing PWR moves in II bars: Up 10x Rock 10x Twist 10x Step 10x  Mirror feedback and chair nearby but pt did not require UE support; cueing to avoid movements from running together; some L LE wt shift avoidance d/t c/o L knee pain. Tendency to flex forward   STS with green medball 10x  Leaning away from L knee, cues to stand fully upright and maintain wider BOS  sitting HS stretch 2x30"  Good tolerance   sidestepping over hurdles At counter; cues for foot positioning to allow for hurdle clearance        HOME EXERCISE PROGRAM Last updated: 06/26/23 Access Code: [email or web address] URL: [email or web address] Date: 06/26/2023 Prepared by: [facility] - Outpatient  Rehab - [facility]  Exercises - Sit to Stand with Arms Crossed  - 1 x daily - 5 x weekly - 2 sets - 10 reps - Seated Hamstring Stretch  - 1 x daily - 5 x weekly - 2 sets - 30 sec hold  Standing PWR moves 10x each, 1-2x/day at counter top     PATIENT EDUCATION: Education details: handout on standing PWR moves and edu for safety and HEP update Person educated: Patient Education method: Explanation, Demonstration, Tactile cues, Verbal cues, and Handouts Education comprehension: verbalized understanding and returned demonstration    Note: Objective measures were completed at Evaluation unless otherwise noted.  DIAGNOSTIC FINDINGS:   COGNITION: Overall cognitive status: unable to perform 3-item recall after 1-2 min, requiring prompts 2/3 recall   SENSATION: WFL  COORDINATION: Difficulty with rapid alternating movement Heel to shin grossly impaired Finger to nose  WNL    MUSCLE TONE: increased tone to RUE flexors and right hamstrings    POSTURE: rounded shoulders, forward head, and increased thoracic kyphosis  LOWER EXTREMITY ROM:     Active  Right Eval Left Eval  Hip flexion    Hip extension    Hip abduction    Hip adduction    Hip internal rotation    Hip external rotation    Knee flexion 120 120  Knee extension -5 0  Ankle dorsiflexion 15 15  Ankle plantarflexion    Ankle inversion    Ankle eversion     (Blank rows = not tested)  LOWER EXTREMITY MMT:    4/5 gross BLE  BED MOBILITY:  Not tested, reports modified independence use bed rails  TRANSFERS: independent    CURB:  Findings: CGA  STAIRS: Not tested GAIT: Findings: Distance walked:   and Comments: narrow BOS, 5-6 steps for turning  FUNCTIONAL TESTS:  5 times sit to stand: 12 sec--difficulty with full upright posture/extension Mini-BESTest: 16/28 3 Meter Backwards Walk Test: NT 10 Meter Walk Test: 16 sec - 2.0 ft/sec  TREATMENT DATE:     PATIENT EDUCATION: Education details: assessment details, rationale of PT intervention Person educated: Patient Education method: Explanation Education comprehension: verbalized understanding  HOME EXERCISE PROGRAM: TBD  GOALS: Goals reviewed with patient? Yes  SHORT TERM GOALS: Target date: 07/03/2023    Patient will be independent in HEP to improve functional outcomes Baseline: Goal status: IN PROGRESS    LONG TERM GOALS: Target date: 07/24/2023    Demo improved motor control and reduced risk for falls per score 24/28 Mini-BESTest Baseline: 16/28 Goal status: IN PROGRESS  2.  Perform 3 Meter Backwards Walk Test in 4.8 sec to reduce risk for falls Baseline: 8.4 sec Goal status: IN PROGRESS  3.  Teach-back relevant strategies for exercise interventions as it pertains to  PD Baseline:  Goal status: IN PROGRESS  4.  Maintain gait speed of 2.8 ft/sec to improve efficiency of community ambulation Baseline: 2.0 ft/sec Goal status: IN PROGRESS    ASSESSMENT:  CLINICAL IMPRESSION: Demo of different online resources for large amplitude exercise to improve engagement and intensity and encouraged her to bring her personal device for us  to add some select videos she may use of continued peformance.  Dynamic balance activities to improve coordination, large amplitude movement and change of direction.  Attempted dual-task with midline balance challenge but unable to divide attention to cognitive component. Static and dynamic balance to facilitate anticipatory and reactive balance strategies to facilitate righting reactions and reduce risk for falls. Continued sessions to advance POC details to improve mobility  OBJECTIVE IMPAIRMENTS: Abnormal gait, decreased activity tolerance, decreased balance, decreased coordination, decreased mobility, difficulty walking, decreased strength, impaired tone, and postural dysfunction.   ACTIVITY LIMITATIONS: carrying, lifting, bending, squatting, transfers, and locomotion level  PARTICIPATION LIMITATIONS: community activity and exercise classes  PERSONAL FACTORS: Age, Time since onset of injury/illness/exacerbation, and 3+ comorbidities: PMH are also affecting patient's functional outcome.   REHAB POTENTIAL: Good  CLINICAL DECISION MAKING: Evolving/moderate complexity  EVALUATION COMPLEXITY: Moderate  PLAN:  PT FREQUENCY: 1-2x/week  PT DURATION: 6 weeks  PLANNED INTERVENTIONS: 97750- Physical Performance Testing, 97110-Therapeutic exercises, 97530- Therapeutic activity, [id number]- Neuromuscular re-education, 97535- Self Care, 09811- Manual therapy, [id number]- Gait training, [phone]- Canalith repositioning, and [phone]- Aquatic Therapy  PLAN FOR NEXT SESSION: progress to standing PWR moves FLOW; work on posture, speed of  movement   12:34 PM, 07/01/23 M. Kelly Desirae Mancusi, PT, DPT Physical Therapist- [place] Office Number: [phone]

## 2023-07-03 ENCOUNTER — Ambulatory Visit

## 2023-07-10 ENCOUNTER — Ambulatory Visit

## 2023-07-10 DIAGNOSIS — R2681 Unsteadiness on feet: Secondary | ICD-10-CM

## 2023-07-10 DIAGNOSIS — R1310 Dysphagia, unspecified: Secondary | ICD-10-CM

## 2023-07-10 DIAGNOSIS — R262 Difficulty in walking, not elsewhere classified: Secondary | ICD-10-CM | POA: Diagnosis not present

## 2023-07-10 DIAGNOSIS — R471 Dysarthria and anarthria: Secondary | ICD-10-CM

## 2023-07-10 DIAGNOSIS — R41841 Cognitive communication deficit: Secondary | ICD-10-CM

## 2023-07-10 DIAGNOSIS — M6281 Muscle weakness (generalized): Secondary | ICD-10-CM

## 2023-07-10 DIAGNOSIS — R2689 Other abnormalities of gait and mobility: Secondary | ICD-10-CM | POA: Diagnosis not present

## 2023-07-10 NOTE — Therapy (Signed)
 OUTPATIENT PHYSICAL THERAPY NEURO TREATMENT   Patient Name: Janet Wilson MRN: [id number] DOB:May 13, 1934, 89 y.o., female Today's Date: 07/10/2023   PCP: Marguerite Shiley, MD REFERRING PROVIDER: Glory Larsen, MD  END OF SESSION:  PT End of Session - 07/10/23 1411     Visit Number 5    Number of Visits 13    Date for PT Re-Evaluation 07/24/23    Authorization Type [employer]    Progress Note Due on Visit 10    PT Start Time 1400    PT Stop Time 1445    PT Time Calculation (min) 45 min    Equipment Utilized During Treatment Gait belt    Activity Tolerance Patient tolerated treatment well    Behavior During Therapy WFL for tasks assessed/performed              Past Medical History:  Diagnosis Date   Aortic aneurysm (HCC)    Arthritis    OA BOTH KNEES AND HANDS   Atrial tachycardia (HCC)    HX OF   GERD (gastroesophageal reflux disease)    Glaucoma    left eye   Goiter    CAUSING COUGH, HOARSINESS AND DRY THROAT   Headache(784.0)    MIGRAINES    Hyperlipidemia    Neuralgia    Overactive bladder    RLS (restless legs syndrome)    Temporal arteritis (HCC)    Past Surgical History:  Procedure Laterality Date   ARTERY BIOPSY Right 07/21/2012   Procedure: BIOPSY TEMPORAL ARTERY;  Surgeon: Darcella Earnest, MD;  Location: [facility];  Service: General;  Laterality: Right;   CERVICAL FUSION  2012   Dr. Nigel Bart   EYE SURGERY     CATARACT EXTRACTION- BILATERAL   INCONTINENCE SURGERY     JOINT REPLACEMENT     right knee   LEFT SHOULDER SURGERY     RIGHT KNEE ARTHROSCOPY  11/2009     ROBOTIC ASSISTED BILATERAL SALPINGO OOPHERECTOMY Bilateral 12/08/2013   Procedure: ROBOTIC ASSISTED LAPAROSCOPIC BILATERAL SALPINGO OOPHORECTOMY WITH STAGING;  Surgeon: Alphonso Aschoff, MD;  Location: [facility];  Service: Gynecology;  Laterality: Bilateral;   TOTAL HIP ARTHROPLASTY Left 04/03/2022   Procedure: TOTAL HIP ARTHROPLASTY ANTERIOR APPROACH;  Surgeon:  Saundra Curl, MD;  Location: [facility];  Service: Orthopedics;  Laterality: Left;   TOTAL KNEE ARTHROPLASTY  07/04/2011   Procedure: TOTAL KNEE ARTHROPLASTY;  Surgeon: Aurther Blue, MD;  Location: [facility];  Service: Orthopedics;  Laterality: Right;   Patient Active Problem List   Diagnosis Date Noted   Dysphagia 07/24/2022   Flatulence, eructation and gas pain 07/24/2022   Hip pain 07/24/2022   Left lower quadrant pain 07/24/2022   Aortic valve disease 04/04/2022   Closed left hip fracture (HCC) 04/02/2022   Aortic aneurysm (HCC) 04/02/2022   Dementia (HCC) 01/21/2022   Post-nasal drip 12/20/2021   Gastroesophageal reflux disease without esophagitis 12/20/2021   Depression with anxiety 12/20/2021   Cellulitis of right leg 12/20/2021   Primary insomnia 12/20/2021   Hyperlipidemia 12/20/2021   Conjunctivitis, chronic, follicular 03/06/2021   Osteoarthritis of right glenohumeral joint 04/04/2020   Coronary artery calcification 12/18/2019   Aortic atherosclerosis (HCC) 12/18/2019   Pure hypercholesterolemia 12/18/2019   Tear of lateral meniscus of knee 01/21/2019   Senile osteopenia 03/20/2018   H/O temporal arteritis 03/20/2018   Memory loss, short term 03/20/2018   Weight loss 03/20/2018   Nausea 03/20/2018   Mixed urge and stress incontinence 03/20/2018  Multinodular goiter (nontoxic) 03/20/2018   RLS (restless legs syndrome) 12/19/2017   Pain in right knee 10/09/2017   Ectopic atrial tachycardia (HCC) 10/08/2016   Cough 09/06/2015   Hoarseness of voice 09/06/2015   Rhinitis, chronic 09/06/2015   Bilateral occipital neuralgia 06/16/2015   Convergence insufficiency 08/09/2014   Neck pain 03/05/2013   Capsular glaucoma with pseudoexfoliation of lens, left eye, moderate stage 12/22/2012   Right temporal headache 06/30/2012   Family history of coronary artery disease 09/24/2011   S/P TKR (total knee replacement) 09/24/2011   Tachycardia 08/10/2011   OA (osteoarthritis)  of knee 07/04/2011    ONSET DATE: "several months ago"  REFERRING DIAG: G20.A1 (ICD-10-CM) - Parkinson disease, symptomatic (HCC) R26.9 (ICD-10-CM) - Gait disorder W19.Benny Braver (ICD-10-CM) - Fall, initial encounter  THERAPY DIAG:  Difficulty in walking, not elsewhere classified  Muscle weakness (generalized)  Other abnormalities of gait and mobility  Unsteadiness on feet  Rationale for Evaluation and Treatment: Rehabilitation  SUBJECTIVE:                                                                                                                                                                                             SUBJECTIVE STATEMENT: Thumb is feeling better.   Pt accompanied by: self  PERTINENT HISTORY: is going to assisted living at [facility]. She fell she tripped over a chandelier overnight. She was going to bed. No freezing episodes, shuffling a little bit more  PAIN:  Are you having pain? Yes: NPRS scale: 2/10 Pain location: R thumb Pain description: sore Aggravating factors: "touching" Relieving factors: heat  PRECAUTIONS: Fall  RED FLAGS: None   WEIGHT BEARING RESTRICTIONS: No  FALLS: Has patient fallen in last 6 months? Yes. Number of falls broke her left hip in a fall x 1 year ago. One fall in past 6 moths  LIVING ENVIRONMENT: Lives with: lives alone Lives in: Other [facility] in ALF, assist for medication, and supervision for bathing Stairs:   Has following equipment at home: Single point cane and Environmental consultant - 4 wheeled  PLOF: Independent with community mobility with device and Needs assistance with homemaking  PATIENT GOALS:   OBJECTIVE:   TODAY'S TREATMENT: 07/10/23 Activity Comments  NU-step level 4 x 5 min Notes grinding discomfort left lateral compartment knee  Seated PWR moves Pt  brought tablet for use--provided instructions to access Youtube for tutorial/class  Seated PWR moves 1x10 Visual cues for continued sequence/speed  Standing PWR  moves 1x10   Pt education on external cues for gait speed          TODAY'S TREATMENT: 07/01/23 Activity Comments  Seated  PWR moves Demo of videos for guided practice at home  Dynamic balance -Retro-walk--cues for large ROM -Fast forward 4x45 -tandem walk w/ cognitive dual tasks   Static multisensory balance W/ anticipatory and reactive demands  Righting reactions           TODAY'S TREATMENT: 06/26/23 Activity Comments  review of seated PWR moves: Up 10x Rock 10x Twist 10x Step 10x  Great carryover and effort; occasional cueing to incorporate LE movements; Tendency to flex forward   standing PWR moves in II bars: Up 10x Rock 10x Twist 10x Step 10x  Mirror feedback and chair nearby but pt did not require UE support; cueing to avoid movements from running together; some L LE wt shift avoidance d/t c/o L knee pain. Tendency to flex forward   STS with green medball 10x  Leaning away from L knee, cues to stand fully upright and maintain wider BOS  sitting HS stretch 2x30"  Good tolerance   sidestepping over hurdles At counter; cues for foot positioning to allow for hurdle clearance        HOME EXERCISE PROGRAM Last updated: 06/26/23 Access Code: [email or web address] URL: [email or web address] Date: 06/26/2023 Prepared by: [facility] - Outpatient  Rehab - [facility]  Exercises - Sit to Stand with Arms Crossed  - 1 x daily - 5 x weekly - 2 sets - 10 reps - Seated Hamstring Stretch  - 1 x daily - 5 x weekly - 2 sets - 30 sec hold  Standing PWR moves 10x each, 1-2x/day at counter top     PATIENT EDUCATION: Education details: handout on standing PWR moves and edu for safety and HEP update Person educated: Patient Education method: Explanation, Demonstration, Tactile cues, Verbal cues, and Handouts Education comprehension: verbalized understanding and returned demonstration    Note: Objective measures were completed at Evaluation unless otherwise noted.  DIAGNOSTIC  FINDINGS:   COGNITION: Overall cognitive status: unable to perform 3-item recall after 1-2 min, requiring prompts 2/3 recall   SENSATION: WFL  COORDINATION: Difficulty with rapid alternating movement Heel to shin grossly impaired Finger to nose WNL    MUSCLE TONE: increased tone to RUE flexors and right hamstrings    POSTURE: rounded shoulders, forward head, and increased thoracic kyphosis  LOWER EXTREMITY ROM:     Active  Right Eval Left Eval  Hip flexion    Hip extension    Hip abduction    Hip adduction    Hip internal rotation    Hip external rotation    Knee flexion 120 120  Knee extension -5 0  Ankle dorsiflexion 15 15  Ankle plantarflexion    Ankle inversion    Ankle eversion     (Blank rows = not tested)  LOWER EXTREMITY MMT:    4/5 gross BLE  BED MOBILITY:  Not tested, reports modified independence use bed rails  TRANSFERS: independent    CURB:  Findings: CGA  STAIRS: Not tested GAIT: Findings: Distance walked:   and Comments: narrow BOS, 5-6 steps for turning  FUNCTIONAL TESTS:  5 times sit to stand: 12 sec--difficulty with full upright posture/extension Mini-BESTest: 16/28 3 Meter Backwards Walk Test: NT 10 Meter Walk Test: 16 sec - 2.0 ft/sec  TREATMENT DATE:     PATIENT EDUCATION: Education details: assessment details, rationale of PT intervention Person educated: Patient Education method: Explanation Education comprehension: verbalized understanding  HOME EXERCISE PROGRAM: TBD  GOALS: Goals reviewed with patient? Yes  SHORT TERM GOALS: Target date: 07/03/2023    Patient will be independent in HEP to improve functional outcomes Baseline: Goal status: IN PROGRESS    LONG TERM GOALS: Target date: 07/24/2023    Demo improved motor control and reduced risk for falls per score 24/28  Mini-BESTest Baseline: 16/28 Goal status: IN PROGRESS  2.  Perform 3 Meter Backwards Walk Test in 4.8 sec to reduce risk for falls Baseline: 8.4 sec Goal status: IN PROGRESS  3.  Teach-back relevant strategies for exercise interventions as it pertains to PD Baseline:  Goal status: IN PROGRESS  4.  Maintain gait speed of 2.8 ft/sec to improve efficiency of community ambulation Baseline: 2.0 ft/sec Goal status: IN PROGRESS    ASSESSMENT:  CLINICAL IMPRESSION: Cotinued with seated and standing large amplitude movements to improve coordination, motor control, flexibility to improve mobility. Pt education regarding external cue anchors for gait such as rhythm/auditory cues for sequencing increased speed. Continued sessions to progress POC details  OBJECTIVE IMPAIRMENTS: Abnormal gait, decreased activity tolerance, decreased balance, decreased coordination, decreased mobility, difficulty walking, decreased strength, impaired tone, and postural dysfunction.   ACTIVITY LIMITATIONS: carrying, lifting, bending, squatting, transfers, and locomotion level  PARTICIPATION LIMITATIONS: community activity and exercise classes  PERSONAL FACTORS: Age, Time since onset of injury/illness/exacerbation, and 3+ comorbidities: PMH are also affecting patient's functional outcome.   REHAB POTENTIAL: Good  CLINICAL DECISION MAKING: Evolving/moderate complexity  EVALUATION COMPLEXITY: Moderate  PLAN:  PT FREQUENCY: 1-2x/week  PT DURATION: 6 weeks  PLANNED INTERVENTIONS: 97750- Physical Performance Testing, 97110-Therapeutic exercises, 97530- Therapeutic activity, [id number]- Neuromuscular re-education, 97535- Self Care, 16109- Manual therapy, [id number]- Gait training, [id number]- Canalith repositioning, and [id number]- Aquatic Therapy  PLAN FOR NEXT SESSION: progress to standing PWR moves FLOW; work on posture, speed of movement   2:11 PM, 07/10/23 M. Kelly Jame Seelig, PT, DPT Physical Therapist- [place] Office  Number: [phone]

## 2023-07-10 NOTE — Addendum Note (Signed)
 Addended by: Daphney Eans B on: 07/10/2023 03:04 PM   Modules accepted: Orders

## 2023-07-10 NOTE — Patient Instructions (Addendum)
  EVERYDAY SENTENCES ("shout" them twice per day)  1) I have to go upstairs to see Janet Wilson.  2) I'm here to get my pills.  3) Good morning everyone!  4) You have to go to the bathroom.  5) You're so cute!  6) How is the weather?  7) Do I need my jacket?  8) Janet Wilson!  9) I wonder what the food is today.  10)

## 2023-07-10 NOTE — Therapy (Signed)
 OUTPATIENT SPEECH LANGUAGE PATHOLOGY PARKINSON'S TREATMENT   Patient Name: Lucielle Vokes MRN: [id number] DOB:07-17-1934, 89 y.o., female Today's Date: 07/10/2023  PCP: Joanell Mowers, MD REFERRING PROVIDER: Glory Larsen, MD  END OF SESSION:  End of Session - 07/10/23 1455     Visit Number 2    Number of Visits 17    Date for SLP Re-Evaluation 08/30/23   due to ST not beginning until 07/10/23   SLP Start Time 1451    SLP Stop Time  1531    SLP Time Calculation (min) 40 min    Activity Tolerance Patient tolerated treatment well              Past Medical History:  Diagnosis Date   Aortic aneurysm (HCC)    Arthritis    OA BOTH KNEES AND HANDS   Atrial tachycardia (HCC)    HX OF   GERD (gastroesophageal reflux disease)    Glaucoma    left eye   Goiter    CAUSING COUGH, HOARSINESS AND DRY THROAT   Headache(784.0)    MIGRAINES    Hyperlipidemia    Neuralgia    Overactive bladder    RLS (restless legs syndrome)    Temporal arteritis (HCC)    Past Surgical History:  Procedure Laterality Date   ARTERY BIOPSY Right 07/21/2012   Procedure: BIOPSY TEMPORAL ARTERY;  Surgeon: Darcella Earnest, MD;  Location: [facility];  Service: General;  Laterality: Right;   CERVICAL FUSION  2012   Dr. Nigel Bart   EYE SURGERY     CATARACT EXTRACTION- BILATERAL   INCONTINENCE SURGERY     JOINT REPLACEMENT     right knee   LEFT SHOULDER SURGERY     RIGHT KNEE ARTHROSCOPY  11/2009     ROBOTIC ASSISTED BILATERAL SALPINGO OOPHERECTOMY Bilateral 12/08/2013   Procedure: ROBOTIC ASSISTED LAPAROSCOPIC BILATERAL SALPINGO OOPHORECTOMY WITH STAGING;  Surgeon: Alphonso Aschoff, MD;  Location: [facility];  Service: Gynecology;  Laterality: Bilateral;   TOTAL HIP ARTHROPLASTY Left 04/03/2022   Procedure: TOTAL HIP ARTHROPLASTY ANTERIOR APPROACH;  Surgeon: Saundra Curl, MD;  Location: [facility];  Service: Orthopedics;  Laterality: Left;   TOTAL KNEE ARTHROPLASTY  07/04/2011   Procedure: TOTAL KNEE  ARTHROPLASTY;  Surgeon: Aurther Blue, MD;  Location: [facility];  Service: Orthopedics;  Laterality: Right;   Patient Active Problem List   Diagnosis Date Noted   Dysphagia 07/24/2022   Flatulence, eructation and gas pain 07/24/2022   Hip pain 07/24/2022   Left lower quadrant pain 07/24/2022   Aortic valve disease 04/04/2022   Closed left hip fracture (HCC) 04/02/2022   Aortic aneurysm (HCC) 04/02/2022   Dementia (HCC) 01/21/2022   Post-nasal drip 12/20/2021   Gastroesophageal reflux disease without esophagitis 12/20/2021   Depression with anxiety 12/20/2021   Cellulitis of right leg 12/20/2021   Primary insomnia 12/20/2021   Hyperlipidemia 12/20/2021   Conjunctivitis, chronic, follicular 03/06/2021   Osteoarthritis of right glenohumeral joint 04/04/2020   Coronary artery calcification 12/18/2019   Aortic atherosclerosis (HCC) 12/18/2019   Pure hypercholesterolemia 12/18/2019   Tear of lateral meniscus of knee 01/21/2019   Senile osteopenia 03/20/2018   H/O temporal arteritis 03/20/2018   Memory loss, short term 03/20/2018   Weight loss 03/20/2018   Nausea 03/20/2018   Mixed urge and stress incontinence 03/20/2018   Multinodular goiter (nontoxic) 03/20/2018   RLS (restless legs syndrome) 12/19/2017   Pain in right knee 10/09/2017   Ectopic atrial tachycardia (HCC) 10/08/2016   Cough  09/06/2015   Hoarseness of voice 09/06/2015   Rhinitis, chronic 09/06/2015   Bilateral occipital neuralgia 06/16/2015   Convergence insufficiency 08/09/2014   Neck pain 03/05/2013   Capsular glaucoma with pseudoexfoliation of lens, left eye, moderate stage 12/22/2012   Right temporal headache 06/30/2012   Family history of coronary artery disease 09/24/2011   S/P TKR (total knee replacement) 09/24/2011   Tachycardia 08/10/2011   OA (osteoarthritis) of knee 07/04/2011    ONSET DATE: "About 4 years ago" Script 05/13/23  REFERRING DIAG: R47.1 (ICD-10-CM) - Dysarthria R49.8 (ICD-10-CM) -  Hypophonia G20.A1 (ICD-10-CM) - Parkinson disease, symptomatic (HCC)  THERAPY DIAG:  Dysarthria and anarthria  Cognitive communication deficit  Dysphagia, unspecified type  Rationale for Evaluation and Treatment: Rehabilitation  SUBJECTIVE:   SUBJECTIVE STATEMENT: "The woman who sits next to me (at dinner) always says she can't hear me. I think she needs hearing aids - the woman across the table hears me fine."  Pt accompanied by: self  PERTINENT HISTORY: See above for PMHx 05/13/2023: [name]) DAT scan positive for Parkinson's Disorder likely idiopathic Parkinson's disease. Her speech pattern is messy, having problems speaking. She was evaluated for speech at [facility]. She is involved in Box[employer]. Discussed exercise is the only thing known to slow down progression of parkinson's disease. She is doing that 2x a week. Her husband went to memory care and he is at rehab and then going to skilled care. Wife is going to assisted living at [facility]. She fell she tripped over a chandelier overnight. She was going to bed. No freezing episodes, shuffling a little bit more, Drinks out of a straw. She had an experience with a slider, she took a slice and she couldn;t swallow, got lodged at the back of her throat, has to drink out of a straw. She declines a swallow study, states she is being more careful and chews thoroughly. [facility] has a dermatologist, discussed PT at [facility].   PAIN:  Are you having pain? Yes: NPRS scale: 5/10 Pain location: lt knee Pain description: sore Aggravating factors: IDK Relieving factors: stretching  FALLS: Has patient fallen in last 6 months?  See PT evaluation for details   PATIENT GOALS: "I'd like to talk louder."  OBJECTIVE:  Note: Objective measures were completed at Evaluation unless otherwise noted.  DIAGNOSTIC FINDINGS:  DaTSCAN  IMPRESSION: Bilateral reduced radiotracer activity within the striata. Greater loss of activity in the RIGHT  striatum. Pattern is typical of Parkinsonian syndrome pathology.   Of note, DaTSCAN  is not diagnostic of Parkinsonian syndromes, which remains a clinical diagnosis. DaTscan  is an adjuvant test to aid in the clinical diagnosis of Parkinsonian syndromes.     PATIENT REPORTED OUTCOME MEASURES (PROM): Communication Effectiveness Survey: to be completed in first 2 sessions                                                                                                                            TREATMENT DATE:  07/10/23: Needs BSE and PROM next session. SLP and pt, given the time since evaluation and her "S" statement, had long discussion re: pt's need to talk almost like she feels like she is shouting. SLP used digital recorder with her today and showed pt that when she speaks like this it is actually WNL volume. SLP and pt generated 9 everyday sentences that pt was instructed to "shout" BID.  06/12/23:  SLP introduced pt to role of SLP in her course of ST. Pt req'd usual  mod cues to shape her loud /a/, as well as usual mod-max cues to use intentional, stronger speech in some diagnostic therapy for louder speech. SLP educated pt that she needs to feel like she is shouting for her to be WNL volume. SLP provided examples with auditory recordings of pt for this. She will need further instruction with this.   PATIENT EDUCATION: Education details: see "treatment date" for details.  Person educated: Patient Education method: Explanation, Demonstration, and Verbal cues Education comprehension: verbalized understanding, returned demonstration, verbal cues required, and needs further education  HOME EXERCISE PROGRAM: Eventually loud /a/, and everyday sentences  GOALS: Goals reviewed with patient? No   SHORT TERM GOALS: Target date: 07/19/23   Produce /a/ with average 85 dB over three sessions Baseline: Goal status: INITIAL   2.  pt will generate average 69dB when responding with single  sentences, x 3 sessions  Baseline:  Goal status: INITIAL   3.  pt will generate 2 minutes simple conversation with 69dB average in 3 sessions  Baseline:  Goal status: INITIAL  4. Pt will demo knowledge of suboptimal speech volume with nonverbal cue by repeating last utterance 70% of the time in 3 sessions   Baseline:  Goal status: INITIAL  5.  Pt will undergo bedside swallow evaluation in first 3 sessions Baseline:  Goal status: INITIAL     LONG TERM GOALS: Target date: 08/16/23   pt will produce average 69dB  in 7 minutes simple conversation in 3 sessions  Baseline:  Goal status: INITIAL   2.  pt will maintain upper 60s dB average in 10 minute simple conversation x 3 sessions  Baseline:  Goal status: INITIAL   3.  pt will report fewer requests to repeat herself than prior to ST in 3 sessions  Baseline:  Goal status: INITIAL   4. Pt will demo knowledge of suboptimal speech volume with nonverbal cue by repeating last utterance 80% of the time in 3 sessions   Baseline:  Goal status: INITIAL  4.  Pt will score higher/better on PROM in the last 2 weeks of therapy Baseline:  Goal status: INITIAL  ASSESSMENT:  CLINICAL IMPRESSION: Patient is a 89 y.o. F who was seen today for treatment of speech due to hypophonic voice in light of Parkinson's disease with sx for approx 4 years, according to pt. She tells SLP "I get sassy sometimes" due to people telling her she needs to incr loudness (see "S" statement from today). Today SLP learned during eval process that pt also has cognitive deficits significant enough for SLP to question if she would work best using SPEAKOUT therapy. A more traditional approach may need to be taken focusing on louder speech instead of following a highly structured therapy framework such as SPEAKOUT. Pt will  need to undergo BSE due to suspected swallowing difficulties. OT evaluation should be considered and referring MD was notified.   OBJECTIVE  IMPAIRMENTS: Objective impairments include attention, memory, dysarthria, and dysphagia. These  impairments are limiting patient from managing medications, managing appointments, managing finances, household responsibilities, ADLs/IADLs, effectively communicating at home and in community, and safety when swallowing.Factors affecting potential to achieve goals and functional outcome are ability to learn/carryover information, cooperation/participation level, and severity of impairments.. Patient will benefit from skilled SLP services to address above impairments and improve overall function.  REHAB POTENTIAL: Fair to Good due to above factors  PLAN:  SLP FREQUENCY: 2x/week  SLP DURATION: 8 weeks  PLANNED INTERVENTIONS: Aspiration precaution training, Pharyngeal strengthening exercises, Diet toleration management , Language facilitation, Environmental controls, Trials of upgraded texture/liquids, Cueing hierachy, Cognitive reorganization, Internal/external aids, Oral motor exercises, Functional tasks, Multimodal communication approach, SLP instruction and feedback, Compensatory strategies, Patient/family education, [phone] Treatment of speech (30 or 45 min) , and [id number] Treatment of swallowing function    Zacharias Ridling, CCC-SLP 07/10/2023, 3:05 PM

## 2023-07-12 ENCOUNTER — Ambulatory Visit

## 2023-07-12 DIAGNOSIS — R471 Dysarthria and anarthria: Secondary | ICD-10-CM

## 2023-07-12 DIAGNOSIS — R262 Difficulty in walking, not elsewhere classified: Secondary | ICD-10-CM

## 2023-07-12 DIAGNOSIS — R2681 Unsteadiness on feet: Secondary | ICD-10-CM | POA: Diagnosis not present

## 2023-07-12 DIAGNOSIS — R2689 Other abnormalities of gait and mobility: Secondary | ICD-10-CM

## 2023-07-12 DIAGNOSIS — M6281 Muscle weakness (generalized): Secondary | ICD-10-CM

## 2023-07-12 DIAGNOSIS — R41841 Cognitive communication deficit: Secondary | ICD-10-CM

## 2023-07-12 DIAGNOSIS — R1310 Dysphagia, unspecified: Secondary | ICD-10-CM

## 2023-07-12 NOTE — Therapy (Signed)
 OUTPATIENT SPEECH LANGUAGE PATHOLOGY PARKINSON'S TREATMENT   Patient Name: Janet Wilson MRN: [id number] DOB:Dec 08, 1934, 89 y.o., female Today's Date: 07/12/2023  PCP: Janet Mowers, MD REFERRING PROVIDER: Glory Larsen, MD  END OF SESSION:  End of Session - 07/12/23 1116     Visit Number 3    Number of Visits 17    Date for SLP Re-Evaluation 08/30/23   due to ST not beginning until 07/10/23   SLP Start Time 1020    SLP Stop Time  1100    SLP Time Calculation (min) 40 min    Activity Tolerance Patient tolerated treatment well               Past Medical History:  Diagnosis Date   Aortic aneurysm (HCC)    Arthritis    OA BOTH KNEES AND HANDS   Atrial tachycardia (HCC)    HX OF   GERD (gastroesophageal reflux disease)    Glaucoma    left eye   Goiter    CAUSING COUGH, HOARSINESS AND DRY THROAT   Headache(784.0)    MIGRAINES    Hyperlipidemia    Neuralgia    Overactive bladder    RLS (restless legs syndrome)    Temporal arteritis (HCC)    Past Surgical History:  Procedure Laterality Date   ARTERY BIOPSY Right 07/21/2012   Procedure: BIOPSY TEMPORAL ARTERY;  Surgeon: Janet Earnest, MD;  Location: [facility];  Service: General;  Laterality: Right;   CERVICAL FUSION  2012   Dr. Nigel Wilson   EYE SURGERY     CATARACT EXTRACTION- BILATERAL   INCONTINENCE SURGERY     JOINT REPLACEMENT     right knee   LEFT SHOULDER SURGERY     RIGHT KNEE ARTHROSCOPY  11/2009     ROBOTIC ASSISTED BILATERAL SALPINGO OOPHERECTOMY Bilateral 12/08/2013   Procedure: ROBOTIC ASSISTED LAPAROSCOPIC BILATERAL SALPINGO OOPHORECTOMY WITH STAGING;  Surgeon: Janet Aschoff, MD;  Location: [facility];  Service: Gynecology;  Laterality: Bilateral;   TOTAL HIP ARTHROPLASTY Left 04/03/2022   Procedure: TOTAL HIP ARTHROPLASTY ANTERIOR APPROACH;  Surgeon: Janet Curl, MD;  Location: [facility];  Service: Orthopedics;  Laterality: Left;   TOTAL KNEE ARTHROPLASTY  07/04/2011   Procedure: TOTAL KNEE  ARTHROPLASTY;  Surgeon: Janet Blue, MD;  Location: [facility];  Service: Orthopedics;  Laterality: Right;   Patient Active Problem List   Diagnosis Date Noted   Dysphagia 07/24/2022   Flatulence, eructation and gas pain 07/24/2022   Hip pain 07/24/2022   Left lower quadrant pain 07/24/2022   Aortic valve disease 04/04/2022   Closed left hip fracture (HCC) 04/02/2022   Aortic aneurysm (HCC) 04/02/2022   Dementia (HCC) 01/21/2022   Post-nasal drip 12/20/2021   Gastroesophageal reflux disease without esophagitis 12/20/2021   Depression with anxiety 12/20/2021   Cellulitis of right leg 12/20/2021   Primary insomnia 12/20/2021   Hyperlipidemia 12/20/2021   Conjunctivitis, chronic, follicular 03/06/2021   Osteoarthritis of right glenohumeral joint 04/04/2020   Coronary artery calcification 12/18/2019   Aortic atherosclerosis (HCC) 12/18/2019   Pure hypercholesterolemia 12/18/2019   Tear of lateral meniscus of knee 01/21/2019   Senile osteopenia 03/20/2018   H/O temporal arteritis 03/20/2018   Memory loss, short term 03/20/2018   Weight loss 03/20/2018   Nausea 03/20/2018   Mixed urge and stress incontinence 03/20/2018   Multinodular goiter (nontoxic) 03/20/2018   RLS (restless legs syndrome) 12/19/2017   Pain in right knee 10/09/2017   Ectopic atrial tachycardia (HCC) 10/08/2016  Cough 09/06/2015   Hoarseness of voice 09/06/2015   Rhinitis, chronic 09/06/2015   Bilateral occipital neuralgia 06/16/2015   Convergence insufficiency 08/09/2014   Neck pain 03/05/2013   Capsular glaucoma with pseudoexfoliation of lens, left eye, moderate stage 12/22/2012   Right temporal headache 06/30/2012   Family history of coronary artery disease 09/24/2011   S/P TKR (total knee replacement) 09/24/2011   Tachycardia 08/10/2011   OA (osteoarthritis) of knee 07/04/2011    ONSET DATE: "About 4 years ago" Script 05/13/23  REFERRING DIAG: R47.1 (ICD-10-CM) - Dysarthria R49.8 (ICD-10-CM) -  Hypophonia G20.A1 (ICD-10-CM) - Parkinson disease, symptomatic (HCC)  THERAPY DIAG:  No diagnosis found.  Rationale for Evaluation and Treatment: Rehabilitation  SUBJECTIVE:   SUBJECTIVE STATEMENT: "The woman who sits next to me (at dinner) always says she can't hear me. I think she needs hearing aids - the woman across the table hears me fine."  Pt accompanied by: self  PERTINENT HISTORY: See above for PMHx 05/13/2023: Janet Wilson) DAT scan positive for Parkinson's Disorder likely idiopathic Parkinson's disease. Her speech pattern is messy, having problems speaking. She was evaluated for speech at [facility]. She is involved in Boxing. Discussed exercise is the only thing known to slow down progression of parkinson's disease. She is doing that 2x a week. Her husband went to memory care and he is at rehab and then going to skilled care. Wife is going to assisted living at [facility]. She fell she tripped over a chandelier overnight. She was going to bed. No freezing episodes, shuffling a little bit more, Drinks out of a straw. She had an experience with a slider, she took a slice and she couldn;t swallow, got lodged at the back of her throat, has to drink out of a straw. She declines a swallow study, states she is being more careful and chews thoroughly. [facility] has a dermatologist, discussed PT at [facility].   PAIN:  Are you having pain? No  FALLS: Has patient fallen in last 6 months?  See PT evaluation for details   PATIENT GOALS: "I'd like to talk louder."  OBJECTIVE:  Note: Objective measures were completed at Evaluation unless otherwise noted.  DIAGNOSTIC FINDINGS:  DaTSCAN  IMPRESSION: Bilateral reduced radiotracer activity within the striata. Greater loss of activity in the RIGHT striatum. Pattern is typical of Parkinsonian syndrome pathology.   Of note, DaTSCAN  is not diagnostic of Parkinsonian syndromes, which remains a clinical diagnosis. DaTscan  is an adjuvant test to  aid in the clinical diagnosis of Parkinsonian syndromes.     PATIENT REPORTED OUTCOME MEASURES (PROM): Communication Effectiveness Survey: 15/32 with higher score indicating more effective communication and greater QOL.                                                                                                                            TREATMENT DATE:   07/12/23: SPEECH: Pt was assessed using her everyday sentences, mod cues initially for volume and intent. Simple answers  to "wh" questions req'd occasional mod cues for incr'd volume and purpose/intent. This carried over into pt's conversation on the way out of ST room today and on her phone call asking for a pick up.   SWALLOWING: BSE today Dentition: adequate natural dentition Vocal quality at baseline: harsh and low vocal intensity Patient directly observed with POs: Yes: regular, dysphagia 3 (soft), and thin liquids  Feeding: able to feed self Liquids provided by: cup - coffee, due to pt commenting her coughing occurs mostly with coffee Oral phase signs and symptoms: "munching" chewing pattern Pharyngeal phase signs and symptoms: multiple swallows and complaints of residue with peanut butter crackers, cleared with liquid wash   Comments: Pt coughed with 1/7 sips coffee. Slurping noted. SLP shared the concept of chewing with intent and pt naturally developed 75% rotary pattern. When cued to swallow with intent prior to 3 subsequent liquid swallows, she indicated better swallowing function ("smoother") than her normal swallow. Pt req'd usual faded to occasional min cues to chew and swallow with intent spontaneously. SLP to cont to treat - goal added. MBS likely not necessary at this time.   07/10/23: Needs BSE and PROM next session. SLP and pt, given the time since evaluation and her "S" statement, had long discussion re: pt's need to talk almost like she feels like she is shouting. SLP used digital recorder with her today and showed pt  that when she speaks like this it is actually WNL volume. SLP and pt generated 9 everyday sentences that pt was instructed to "shout" BID.  06/12/23:  SLP introduced pt to role of SLP in her course of ST. Pt req'd usual  mod cues to shape her loud /a/, as well as usual mod-max cues to use intentional, stronger speech in some diagnostic therapy for louder speech. SLP educated pt that she needs to feel like she is shouting for her to be WNL volume. SLP provided examples with auditory recordings of pt for this. She will need further instruction with this.   PATIENT EDUCATION: Education details: see "treatment date" for details.  Person educated: Patient Education method: Explanation, Demonstration, and Verbal cues Education comprehension: verbalized understanding, returned demonstration, verbal cues required, and needs further education  HOME EXERCISE PROGRAM: Eventually loud /a/, and everyday sentences  GOALS: Goals reviewed with patient? No   SHORT TERM GOALS: Target date: 07/19/23   Produce /a/ with average 85 dB over three sessions Baseline: Goal status: INITIAL   2.  pt will generate average 69dB when responding with single sentences, x 3 sessions  Baseline:  Goal status: INITIAL   3.  pt will generate 2 minutes simple conversation with 69dB average in 3 sessions  Baseline:  Goal status: INITIAL  4. Pt will demo knowledge of suboptimal speech volume with nonverbal cue by repeating last utterance 70% of the time in 3 sessions   Baseline:  Goal status: INITIAL  5.  Pt will undergo bedside swallow evaluation in first 3 sessions Baseline:  Goal status: met  6  Pt will report using greater use of intent/purpose when swallowing than prior to ST, with rare min A Baseline:  Goal status: INITIAL     LONG TERM GOALS: Target date: 08/16/23   pt will produce average 69dB  in 7 minutes simple conversation in 3 sessions  Baseline:  Goal status: INITIAL   2.  pt will maintain upper 60s  dB average in 10 minute simple conversation x 3 sessions  Baseline:  Goal status: INITIAL  3.  pt will report fewer requests to repeat herself than prior to ST in 3 sessions  Baseline:  Goal status: INITIAL   4. Pt will demo knowledge of suboptimal speech volume with nonverbal cue by repeating last utterance 80% of the time in 3 sessions   Baseline:  Goal status: INITIAL  5.  Pt will score higher/better on PROM in the last 2 weeks of therapy Baseline:  Goal status: INITIAL  6.  Pt will spontaneously use rotary pattern chewing 75% of the time in 3 sessions Baseline:  Goal status: INITIAL  7.  Pt will report using greater use of intent/purpose when swallowing with mod I than prior to ST Baseline:  Goal status: INITIAL  ASSESSMENT:  CLINICAL IMPRESSION: Patient is a 89 y.o. F who was seen today for treatment of speech due to hypophonic voice in light of Parkinson's disease with sx for approx 4 years, according to pt. Pt also had BSE today, results above and goals added. SLP and pt agreed a more traditional approach may need to be taken focusing on louder speech instead of following a highly structured therapy framework such as SPEAKOUT. OT evaluation should be considered referral was sent by referring MD. Pt needs to schedule OT eval if she desires.   OBJECTIVE IMPAIRMENTS: Objective impairments include attention, memory, dysarthria, and dysphagia. These impairments are limiting patient from managing medications, managing appointments, managing finances, household responsibilities, ADLs/IADLs, effectively communicating at home and in community, and safety when swallowing.Factors affecting potential to achieve goals and functional outcome are ability to learn/carryover information, cooperation/participation level, and severity of impairments.. Patient will benefit from skilled SLP services to address above impairments and improve overall function.  REHAB POTENTIAL: Fair to Good due to  above factors  PLAN:  SLP FREQUENCY: 2x/week  SLP DURATION: 8 weeks  PLANNED INTERVENTIONS: Aspiration precaution training, Pharyngeal strengthening exercises, Diet toleration management , Language facilitation, Environmental controls, Trials of upgraded texture/liquids, Cueing hierachy, Cognitive reorganization, Internal/external aids, Oral motor exercises, Functional tasks, Multimodal communication approach, SLP instruction and feedback, Compensatory strategies, Patient/family education, [phone] Treatment of speech (30 or 45 min) , and [id number] Treatment of swallowing function    Kynedi Profitt, CCC-SLP 07/12/2023, 11:16 AM

## 2023-07-12 NOTE — Therapy (Signed)
 OUTPATIENT PHYSICAL THERAPY NEURO TREATMENT   Patient Name: Janet Wilson MRN: [id number] DOB:December 24, 1934, 89 y.o., female Today's Date: 07/12/2023   PCP: Marguerite Shiley, MD REFERRING PROVIDER: Glory Larsen, MD  END OF SESSION:  PT End of Session - 07/12/23 0934     Visit Number 6    Number of Visits 13    Date for PT Re-Evaluation 07/24/23    Authorization Type [employer]    Progress Note Due on Visit 10    PT Start Time 0930    PT Stop Time 1015    PT Time Calculation (min) 45 min    Equipment Utilized During Treatment Gait belt    Activity Tolerance Patient tolerated treatment well    Behavior During Therapy WFL for tasks assessed/performed              Past Medical History:  Diagnosis Date   Aortic aneurysm (HCC)    Arthritis    OA BOTH KNEES AND HANDS   Atrial tachycardia (HCC)    HX OF   GERD (gastroesophageal reflux disease)    Glaucoma    left eye   Goiter    CAUSING COUGH, HOARSINESS AND DRY THROAT   Headache(784.0)    MIGRAINES    Hyperlipidemia    Neuralgia    Overactive bladder    RLS (restless legs syndrome)    Temporal arteritis (HCC)    Past Surgical History:  Procedure Laterality Date   ARTERY BIOPSY Right 07/21/2012   Procedure: BIOPSY TEMPORAL ARTERY;  Surgeon: Darcella Earnest, MD;  Location: [facility];  Service: General;  Laterality: Right;   CERVICAL FUSION  2012   Dr. Nigel Bart   EYE SURGERY     CATARACT EXTRACTION- BILATERAL   INCONTINENCE SURGERY     JOINT REPLACEMENT     right knee   LEFT SHOULDER SURGERY     RIGHT KNEE ARTHROSCOPY  11/2009     ROBOTIC ASSISTED BILATERAL SALPINGO OOPHERECTOMY Bilateral 12/08/2013   Procedure: ROBOTIC ASSISTED LAPAROSCOPIC BILATERAL SALPINGO OOPHORECTOMY WITH STAGING;  Surgeon: Alphonso Aschoff, MD;  Location: [facility];  Service: Gynecology;  Laterality: Bilateral;   TOTAL HIP ARTHROPLASTY Left 04/03/2022   Procedure: TOTAL HIP ARTHROPLASTY ANTERIOR APPROACH;  Surgeon:  Saundra Curl, MD;  Location: [facility];  Service: Orthopedics;  Laterality: Left;   TOTAL KNEE ARTHROPLASTY  07/04/2011   Procedure: TOTAL KNEE ARTHROPLASTY;  Surgeon: Aurther Blue, MD;  Location: [facility];  Service: Orthopedics;  Laterality: Right;   Patient Active Problem List   Diagnosis Date Noted   Dysphagia 07/24/2022   Flatulence, eructation and gas pain 07/24/2022   Hip pain 07/24/2022   Left lower quadrant pain 07/24/2022   Aortic valve disease 04/04/2022   Closed left hip fracture (HCC) 04/02/2022   Aortic aneurysm (HCC) 04/02/2022   Dementia (HCC) 01/21/2022   Post-nasal drip 12/20/2021   Gastroesophageal reflux disease without esophagitis 12/20/2021   Depression with anxiety 12/20/2021   Cellulitis of right leg 12/20/2021   Primary insomnia 12/20/2021   Hyperlipidemia 12/20/2021   Conjunctivitis, chronic, follicular 03/06/2021   Osteoarthritis of right glenohumeral joint 04/04/2020   Coronary artery calcification 12/18/2019   Aortic atherosclerosis (HCC) 12/18/2019   Pure hypercholesterolemia 12/18/2019   Tear of lateral meniscus of knee 01/21/2019   Senile osteopenia 03/20/2018   H/O temporal arteritis 03/20/2018   Memory loss, short term 03/20/2018   Weight loss 03/20/2018   Nausea 03/20/2018   Mixed urge and stress incontinence 03/20/2018  Multinodular goiter (nontoxic) 03/20/2018   RLS (restless legs syndrome) 12/19/2017   Pain in right knee 10/09/2017   Ectopic atrial tachycardia (HCC) 10/08/2016   Cough 09/06/2015   Hoarseness of voice 09/06/2015   Rhinitis, chronic 09/06/2015   Bilateral occipital neuralgia 06/16/2015   Convergence insufficiency 08/09/2014   Neck pain 03/05/2013   Capsular glaucoma with pseudoexfoliation of lens, left eye, moderate stage 12/22/2012   Right temporal headache 06/30/2012   Family history of coronary artery disease 09/24/2011   S/P TKR (total knee replacement) 09/24/2011   Tachycardia 08/10/2011   OA (osteoarthritis)  of knee 07/04/2011    ONSET DATE: "several months ago"  REFERRING DIAG: G20.A1 (ICD-10-CM) - Parkinson disease, symptomatic (HCC) R26.9 (ICD-10-CM) - Gait disorder W19.Benny Braver (ICD-10-CM) - Fall, initial encounter  THERAPY DIAG:  Difficulty in walking, not elsewhere classified  Muscle weakness (generalized)  Other abnormalities of gait and mobility  Unsteadiness on feet  Rationale for Evaluation and Treatment: Rehabilitation  SUBJECTIVE:                                                                                                                                                                                             SUBJECTIVE STATEMENT: Doing ok, went to boxing this week   Pt accompanied by: self  PERTINENT HISTORY: is going to assisted living at [facility]. She fell she tripped over a chandelier overnight. She was going to bed. No freezing episodes, shuffling a little bit more  PAIN:  Are you having pain? Yes: NPRS scale: 2/10 Pain location: R thumb Pain description: sore Aggravating factors: "touching" Relieving factors: heat  PRECAUTIONS: Fall  RED FLAGS: None   WEIGHT BEARING RESTRICTIONS: No  FALLS: Has patient fallen in last 6 months? Yes. Number of falls broke her left hip in a fall x 1 year ago. One fall in past 6 moths  LIVING ENVIRONMENT: Lives with: lives alone Lives in: Other [facility] in ALF, assist for medication, and supervision for bathing Stairs:   Has following equipment at home: Single point cane and Environmental consultant - 4 wheeled  PLOF: Independent with community mobility with device and Needs assistance with homemaking  PATIENT GOALS:   OBJECTIVE:   TODAY'S TREATMENT: 07/12/23 Activity Comments  Dynamic balance warm-up   Standing PWR moves 1x10 W/ red t-band, a little too heavy, provided yellow  Static multisensory balance -standing on foam: reaching across midline transferring cones 2x10. EO/EC x 30 sec, head turns EO/EC 3x  Gait training  -trials for speed w/ rollator -trials for speed w/ reciprocal arm swing  Left knee assessment -valgus deformity -laxity to varus stress -pain to lateral  compartment with valgus force -discussed unloading brace for OA for pain/stability             HOME EXERCISE PROGRAM Last updated: 06/26/23 Access Code: [email or web address] URL: [email or web address] Date: 06/26/2023 Prepared by: [facility] - [facility]  Exercises - Sit to Stand with Arms Crossed  - 1 x daily - 5 x weekly - 2 sets - 10 reps - Seated Hamstring Stretch  - 1 x daily - 5 x weekly - 2 sets - 30 sec hold  Standing PWR moves 10x each, 1-2x/day at counter top     PATIENT EDUCATION: Education details: handout on standing PWR moves and edu for safety and HEP update Person educated: Patient Education method: Explanation, Demonstration, Tactile cues, Verbal cues, and Handouts Education comprehension: verbalized understanding and returned demonstration    Note: Objective measures were completed at Evaluation unless otherwise noted.  DIAGNOSTIC FINDINGS:   COGNITION: Overall cognitive status: unable to perform 3-item recall after 1-2 min, requiring prompts 2/3 recall   SENSATION: WFL  COORDINATION: Difficulty with rapid alternating movement Heel to shin grossly impaired Finger to nose WNL    MUSCLE TONE: increased tone to RUE flexors and right hamstrings    POSTURE: rounded shoulders, forward head, and increased thoracic kyphosis  LOWER EXTREMITY ROM:     Active  Right Eval Left Eval  Hip flexion    Hip extension    Hip abduction    Hip adduction    Hip internal rotation    Hip external rotation    Knee flexion 120 120  Knee extension -5 0  Ankle dorsiflexion 15 15  Ankle plantarflexion    Ankle inversion    Ankle eversion     (Blank rows = not tested)  LOWER EXTREMITY MMT:    4/5 gross BLE  BED MOBILITY:  Not tested, reports modified independence use bed  rails  TRANSFERS: independent    CURB:  Findings: CGA  STAIRS: Not tested GAIT: Findings: Distance walked:   and Comments: narrow BOS, 5-6 steps for turning  FUNCTIONAL TESTS:  5 times sit to stand: 12 sec--difficulty with full upright posture/extension Mini-BESTest: 16/28 3 Meter Backwards Walk Test: NT 10 Meter Walk Test: 16 sec - 2.0 ft/sec                                                                                                                                 TREATMENT DATE:     PATIENT EDUCATION: Education details: assessment details, rationale of PT intervention Person educated: Patient Education method: Explanation Education comprehension: verbalized understanding  HOME EXERCISE PROGRAM: TBD  GOALS: Goals reviewed with patient? Yes  SHORT TERM GOALS: Target date: 07/03/2023    Patient will be independent in HEP to improve functional outcomes Baseline: Goal status: IN PROGRESS    LONG TERM GOALS: Target date: 07/24/2023    Demo improved motor control and reduced risk for falls per score  24/28 Mini-BESTest Baseline: 16/28 Goal status: IN PROGRESS  2.  Perform 3 Meter Backwards Walk Test in 4.8 sec to reduce risk for falls Baseline: 8.4 sec Goal status: IN PROGRESS  3.  Teach-back relevant strategies for exercise interventions as it pertains to PD Baseline:  Goal status: IN PROGRESS  4.  Maintain gait speed of 2.8 ft/sec to improve efficiency of community ambulation Baseline: 2.0 ft/sec Goal status: IN PROGRESS    ASSESSMENT:  CLINICAL IMPRESSION: Dynamic balance activities to improve unsupported standing, single limb support, and stability under narrow BOS conditions. Standing large amplitude movements with addition of t-band for upper body resistance to improve postural strength/endurance. Static multisensory balance activities to improve postural stability and proprioceptive awareness with good control demo mild sway under sensory  deprived conditions. Gait training to reinforce increased speed and reciprocal arm swing when not using AD  OBJECTIVE IMPAIRMENTS: Abnormal gait, decreased activity tolerance, decreased balance, decreased coordination, decreased mobility, difficulty walking, decreased strength, impaired tone, and postural dysfunction.   ACTIVITY LIMITATIONS: carrying, lifting, bending, squatting, transfers, and locomotion level  PARTICIPATION LIMITATIONS: community activity and exercise classes  PERSONAL FACTORS: Age, Time since onset of injury/illness/exacerbation, and 3+ comorbidities: PMH are also affecting patient's functional outcome.   REHAB POTENTIAL: Good  CLINICAL DECISION MAKING: Evolving/moderate complexity  EVALUATION COMPLEXITY: Moderate  PLAN:  PT FREQUENCY: 1-2x/week  PT DURATION: 6 weeks  PLANNED INTERVENTIONS: 97750- Physical Performance Testing, 97110-Therapeutic exercises, 97530- Therapeutic activity, [id number]- Neuromuscular re-education, 97535- Self Care, 16109- Manual therapy, [id number]- Gait training, [phone]- Canalith repositioning, and [id number]- Aquatic Therapy  PLAN FOR NEXT SESSION: progress to standing PWR moves FLOW; work on posture, speed of movement   9:34 AM, 07/12/23 M. Kelly Donald Jacque, PT, DPT Physical Therapist- [place] Office Number: [phone]

## 2023-07-17 ENCOUNTER — Ambulatory Visit: Attending: Neurology

## 2023-07-17 ENCOUNTER — Ambulatory Visit

## 2023-07-17 DIAGNOSIS — R2689 Other abnormalities of gait and mobility: Secondary | ICD-10-CM

## 2023-07-17 DIAGNOSIS — R41841 Cognitive communication deficit: Secondary | ICD-10-CM | POA: Diagnosis present

## 2023-07-17 DIAGNOSIS — R2681 Unsteadiness on feet: Secondary | ICD-10-CM | POA: Insufficient documentation

## 2023-07-17 DIAGNOSIS — R262 Difficulty in walking, not elsewhere classified: Secondary | ICD-10-CM

## 2023-07-17 DIAGNOSIS — R471 Dysarthria and anarthria: Secondary | ICD-10-CM | POA: Diagnosis not present

## 2023-07-17 DIAGNOSIS — M6281 Muscle weakness (generalized): Secondary | ICD-10-CM | POA: Diagnosis not present

## 2023-07-17 DIAGNOSIS — R1310 Dysphagia, unspecified: Secondary | ICD-10-CM | POA: Insufficient documentation

## 2023-07-17 NOTE — Therapy (Signed)
 OUTPATIENT SPEECH LANGUAGE PATHOLOGY PARKINSON'S TREATMENT   Patient Name: Janet Wilson MRN: [id number] DOB:05-22-1934, 89 y.o., female Today's Date: 07/17/2023  PCP: Joanell Mowers, MD REFERRING PROVIDER: Glory Larsen, MD  END OF SESSION:  End of Session - 07/17/23 1116     Visit Number 4    Number of Visits 17    Date for SLP Re-Evaluation 08/30/23   due to ST not beginning until 07/10/23   SLP Start Time 1110    SLP Stop Time  1147    SLP Time Calculation (min) 37 min    Activity Tolerance Patient tolerated treatment well                Past Medical History:  Diagnosis Date   Aortic aneurysm (HCC)    Arthritis    OA BOTH KNEES AND HANDS   Atrial tachycardia (HCC)    HX OF   GERD (gastroesophageal reflux disease)    Glaucoma    left eye   Goiter    CAUSING COUGH, HOARSINESS AND DRY THROAT   Headache(784.0)    MIGRAINES    Hyperlipidemia    Neuralgia    Overactive bladder    RLS (restless legs syndrome)    Temporal arteritis (HCC)    Past Surgical History:  Procedure Laterality Date   ARTERY BIOPSY Right 07/21/2012   Procedure: BIOPSY TEMPORAL ARTERY;  Surgeon: Darcella Earnest, MD;  Location: [facility];  Service: General;  Laterality: Right;   CERVICAL FUSION  2012   Dr. Nigel Bart   EYE SURGERY     CATARACT EXTRACTION- BILATERAL   INCONTINENCE SURGERY     JOINT REPLACEMENT     right knee   LEFT SHOULDER SURGERY     RIGHT KNEE ARTHROSCOPY  11/2009     ROBOTIC ASSISTED BILATERAL SALPINGO OOPHERECTOMY Bilateral 12/08/2013   Procedure: ROBOTIC ASSISTED LAPAROSCOPIC BILATERAL SALPINGO OOPHORECTOMY WITH STAGING;  Surgeon: Alphonso Aschoff, MD;  Location: [facility];  Service: Gynecology;  Laterality: Bilateral;   TOTAL HIP ARTHROPLASTY Left 04/03/2022   Procedure: TOTAL HIP ARTHROPLASTY ANTERIOR APPROACH;  Surgeon: Saundra Curl, MD;  Location: [facility];  Service: Orthopedics;  Laterality: Left;   TOTAL KNEE ARTHROPLASTY  07/04/2011   Procedure: TOTAL  KNEE ARTHROPLASTY;  Surgeon: Aurther Blue, MD;  Location: [facility];  Service: Orthopedics;  Laterality: Right;   Patient Active Problem List   Diagnosis Date Noted   Dysphagia 07/24/2022   Flatulence, eructation and gas pain 07/24/2022   Hip pain 07/24/2022   Left lower quadrant pain 07/24/2022   Aortic valve disease 04/04/2022   Closed left hip fracture (HCC) 04/02/2022   Aortic aneurysm (HCC) 04/02/2022   Dementia (HCC) 01/21/2022   Post-nasal drip 12/20/2021   Gastroesophageal reflux disease without esophagitis 12/20/2021   Depression with anxiety 12/20/2021   Cellulitis of right leg 12/20/2021   Primary insomnia 12/20/2021   Hyperlipidemia 12/20/2021   Conjunctivitis, chronic, follicular 03/06/2021   Osteoarthritis of right glenohumeral joint 04/04/2020   Coronary artery calcification 12/18/2019   Aortic atherosclerosis (HCC) 12/18/2019   Pure hypercholesterolemia 12/18/2019   Tear of lateral meniscus of knee 01/21/2019   Senile osteopenia 03/20/2018   H/O temporal arteritis 03/20/2018   Memory loss, short term 03/20/2018   Weight loss 03/20/2018   Nausea 03/20/2018   Mixed urge and stress incontinence 03/20/2018   Multinodular goiter (nontoxic) 03/20/2018   RLS (restless legs syndrome) 12/19/2017   Pain in right knee 10/09/2017   Ectopic atrial tachycardia (HCC) 10/08/2016  Cough 09/06/2015   Hoarseness of voice 09/06/2015   Rhinitis, chronic 09/06/2015   Bilateral occipital neuralgia 06/16/2015   Convergence insufficiency 08/09/2014   Neck pain 03/05/2013   Capsular glaucoma with pseudoexfoliation of lens, left eye, moderate stage 12/22/2012   Right temporal headache 06/30/2012   Family history of coronary artery disease 09/24/2011   S/P TKR (total knee replacement) 09/24/2011   Tachycardia 08/10/2011   OA (osteoarthritis) of knee 07/04/2011    ONSET DATE: "About 4 years ago" Script 05/13/23  REFERRING DIAG: R47.1 (ICD-10-CM) - Dysarthria R49.8 (ICD-10-CM) -  Hypophonia G20.A1 (ICD-10-CM) - Parkinson disease, symptomatic (HCC)  THERAPY DIAG:  Dysarthria and anarthria  Dysphagia, unspecified type  Cognitive communication deficit  Rationale for Evaluation and Treatment: Rehabilitation  SUBJECTIVE:   SUBJECTIVE STATEMENT: "It helps that I'm conscious about (my speech)." Pt indicated last night she "was forceful" when talking with husband and he understood her more than if she were not more mindful of her speech production.  Pt accompanied by: self  PERTINENT HISTORY: See above for PMHx 05/13/2023: [name]) DAT scan positive for Parkinson's Disorder likely idiopathic Parkinson's disease. Her speech pattern is messy, having problems speaking. She was evaluated for speech at [facility]. She is involved in Box[employer]. Discussed exercise is the only thing known to slow down progression of parkinson's disease. She is doing that 2x a week. Her husband went to memory care and he is at rehab and then going to skilled care. Wife is going to assisted living at [facility]. She fell she tripped over a chandelier overnight. She was going to bed. No freezing episodes, shuffling a little bit more, Drinks out of a straw. She had an experience with a slider, she took a slice and she couldn;t swallow, got lodged at the back of her throat, has to drink out of a straw. She declines a swallow study, states she is being more careful and chews thoroughly. [facility] has a dermatologist, discussed PT at [facility].   PAIN:  Are you having pain? No  FALLS: Has patient fallen in last 6 months?  See PT evaluation for details   PATIENT GOALS: "I'd like to talk louder."  OBJECTIVE:  Note: Objective measures were completed at Evaluation unless otherwise noted.  DIAGNOSTIC FINDINGS:  DaTSCAN  IMPRESSION: Bilateral reduced radiotracer activity within the striata. Greater loss of activity in the RIGHT striatum. Pattern is typical of Parkinsonian syndrome pathology.   Of  note, DaTSCAN  is not diagnostic of Parkinsonian syndromes, which remains a clinical diagnosis. DaTscan  is an adjuvant test to aid in the clinical diagnosis of Parkinsonian syndromes.     PATIENT REPORTED OUTCOME MEASURES (PROM): Communication Effectiveness Survey: 15/32 with higher score indicating more effective communication and greater QOL.                                                                                                                            TREATMENT DATE:   07/17/23: SPEECH: SLP spent approx  10 minutes shaping pt's loud /a/ after "HEY!" (HEY! Ahhhhhhhh--") to encourage louder speech production with /a/. She initially req'd consistent max cues faded to occasional mod cues for what to say, and for loudness. In word responses using simple problem solving/reasoning stimuli ("3 clues" task) pt req'd usual mod cues initially, faded to occasional min A. Pt was told to perform everyday sentences at home BID so that SLP can cont to shape /a/ in sessions.    07/12/23: SPEECH: Pt was assessed using her everyday sentences, mod cues initially for volume and intent. Simple answers to "wh" questions req'd occasional mod cues for incr'd volume and purpose/intent. This carried over into pt's conversation on the way out of ST room today and on her phone call asking for a pick up.   SWALLOWING: BSE today Dentition: adequate natural dentition Vocal quality at baseline: harsh and low vocal intensity Patient directly observed with POs: Yes: regular, dysphagia 3 (soft), and thin liquids  Feeding: able to feed self Liquids provided by: cup - coffee, due to pt commenting her coughing occurs mostly with coffee Oral phase signs and symptoms: "munching" chewing pattern Pharyngeal phase signs and symptoms: multiple swallows and complaints of residue with peanut butter crackers, cleared with liquid wash   Comments: Pt coughed with 1/7 sips coffee. Slurping noted. SLP shared the concept of chewing  with intent and pt naturally developed 75% rotary pattern. When cued to swallow with intent prior to 3 subsequent liquid swallows, she indicated better swallowing function ("smoother") than her normal swallow. Pt req'd usual faded to occasional min cues to chew and swallow with intent spontaneously. SLP to cont to treat - goal added. MBS likely not necessary at this time.   07/10/23: Needs BSE and PROM next session. SLP and pt, given the time since evaluation and her "S" statement, had long discussion re: pt's need to talk almost like she feels like she is shouting. SLP used digital recorder with her today and showed pt that when she speaks like this it is actually WNL volume. SLP and pt generated 9 everyday sentences that pt was instructed to "shout" BID.  06/12/23:  SLP introduced pt to role of SLP in her course of ST. Pt req'd usual  mod cues to shape her loud /a/, as well as usual mod-max cues to use intentional, stronger speech in some diagnostic therapy for louder speech. SLP educated pt that she needs to feel like she is shouting for her to be WNL volume. SLP provided examples with auditory recordings of pt for this. She will need further instruction with this.   PATIENT EDUCATION: Education details: see "treatment date" for details.  Person educated: Patient Education method: Explanation, Demonstration, and Verbal cues Education comprehension: verbalized understanding, returned demonstration, verbal cues required, and needs further education  HOME EXERCISE PROGRAM: Eventually loud /a/, and everyday sentences  GOALS: Goals reviewed with patient? No   SHORT TERM GOALS: Target date:  08/02/23 (due to visits)   Produce /a/ with average 85 dB over three sessions Baseline: Goal status: INITIAL   2.  pt will generate average 69dB when responding with single sentences, x 3 sessions  Baseline:  Goal status: INITIAL   3.  pt will generate 2 minutes simple conversation with 69dB average in 3  sessions  Baseline:  Goal status: INITIAL  4. Pt will demo knowledge of suboptimal speech volume with nonverbal cue by repeating last utterance 70% of the time in 3 sessions   Baseline:  Goal status:  INITIAL  5.  Pt will undergo bedside swallow evaluation in first 3 sessions Baseline:  Goal status: met  6  Pt will report using greater use of intent/purpose when swallowing than prior to ST, with rare min A Baseline:  Goal status: INITIAL     LONG TERM GOALS: Target date: 08/16/23   pt will produce average 69dB  in 7 minutes simple conversation in 3 sessions  Baseline:  Goal status: INITIAL   2.  pt will maintain upper 60s dB average in 10 minute simple conversation x 3 sessions  Baseline:  Goal status: INITIAL   3.  pt will report fewer requests to repeat herself than prior to ST in 3 sessions  Baseline:  Goal status: INITIAL   4. Pt will demo knowledge of suboptimal speech volume with nonverbal cue by repeating last utterance 80% of the time in 3 sessions   Baseline:  Goal status: INITIAL  5.  Pt will score higher/better on PROM in the last 2 weeks of therapy Baseline:  Goal status: INITIAL  6.  Pt will spontaneously use rotary pattern chewing 75% of the time in 3 sessions Baseline:  Goal status: INITIAL  7.  Pt will report using greater use of intent/purpose when swallowing with mod I than prior to ST Baseline:  Goal status: INITIAL  ASSESSMENT:  CLINICAL IMPRESSION: SLP modified pt's STG date due to visit number. Patient is a 89 y.o. F who was seen today for treatment of speech due to hypophonic voice in light of Parkinson's disease with sx for approx 4 years, according to pt. During eval, SLP and pt agreed a more traditional approach may need to be taken focusing on louder speech instead of following a highly structured therapy framework such as SPEAKOUT. OT evaluation should be considered referral was sent by referring MD. Pt needs to schedule OT eval if she  desires.   OBJECTIVE IMPAIRMENTS: Objective impairments include attention, memory, dysarthria, and dysphagia. These impairments are limiting patient from managing medications, managing appointments, managing finances, household responsibilities, ADLs/IADLs, effectively communicating at home and in community, and safety when swallowing.Factors affecting potential to achieve goals and functional outcome are ability to learn/carryover information, cooperation/participation level, and severity of impairments.. Patient will benefit from skilled SLP services to address above impairments and improve overall function.  REHAB POTENTIAL: Fair to Good due to above factors  PLAN:  SLP FREQUENCY: 2x/week  SLP DURATION: 8 weeks  PLANNED INTERVENTIONS: Aspiration precaution training, Pharyngeal strengthening exercises, Diet toleration management , Language facilitation, Environmental controls, Trials of upgraded texture/liquids, Cueing hierachy, Cognitive reorganization, Internal/external aids, Oral motor exercises, Functional tasks, Multimodal communication approach, SLP instruction and feedback, Compensatory strategies, Patient/family education, [phone] Treatment of speech (30 or 45 min) , and [id number] Treatment of swallowing function    Mason Burleigh, CCC-SLP 07/17/2023, 11:17 AM

## 2023-07-17 NOTE — Therapy (Signed)
 OUTPATIENT PHYSICAL THERAPY NEURO TREATMENT   Patient Name: Janet Wilson MRN: [id number] DOB:[birth year], 89 y.o., female Today's Date: 07/17/2023   PCP: Marguerite Shiley, MD REFERRING PROVIDER: Glory Larsen, MD  END OF SESSION:  PT End of Session - 07/17/23 1142     Visit Number 7    Number of Visits 13    Date for PT Re-Evaluation 07/24/23    Authorization Type [employer]    Progress Note Due on Visit 10    PT Start Time 1145    PT Stop Time 1230    PT Time Calculation (min) 45 min    Equipment Utilized During Treatment Gait belt    Activity Tolerance Patient tolerated treatment well    Behavior During Therapy WFL for tasks assessed/performed              Past Medical History:  Diagnosis Date   Aortic aneurysm (HCC)    Arthritis    OA BOTH KNEES AND HANDS   Atrial tachycardia (HCC)    HX OF   GERD (gastroesophageal reflux disease)    Glaucoma    left eye   Goiter    CAUSING COUGH, HOARSINESS AND DRY THROAT   Headache(784.0)    MIGRAINES    Hyperlipidemia    Neuralgia    Overactive bladder    RLS (restless legs syndrome)    Temporal arteritis (HCC)    Past Surgical History:  Procedure Laterality Date   ARTERY BIOPSY Right 07/21/2012   Procedure: BIOPSY TEMPORAL ARTERY;  Surgeon: Darcella Earnest, MD;  Location: [facility];  Service: General;  Laterality: Right;   CERVICAL FUSION  2012   Dr. Nigel Bart   EYE SURGERY     CATARACT EXTRACTION- BILATERAL   INCONTINENCE SURGERY     JOINT REPLACEMENT     right knee   LEFT SHOULDER SURGERY     RIGHT KNEE ARTHROSCOPY  11/2009     ROBOTIC ASSISTED BILATERAL SALPINGO OOPHERECTOMY Bilateral 12/08/2013   Procedure: ROBOTIC ASSISTED LAPAROSCOPIC BILATERAL SALPINGO OOPHORECTOMY WITH STAGING;  Surgeon: Alphonso Aschoff, MD;  Location: [facility];  Service: Gynecology;  Laterality: Bilateral;   TOTAL HIP ARTHROPLASTY Left 04/03/2022   Procedure: TOTAL HIP ARTHROPLASTY ANTERIOR APPROACH;  Surgeon:  Saundra Curl, MD;  Location: [facility];  Service: Orthopedics;  Laterality: Left;   TOTAL KNEE ARTHROPLASTY  07/04/2011   Procedure: TOTAL KNEE ARTHROPLASTY;  Surgeon: Aurther Blue, MD;  Location: [facility];  Service: Orthopedics;  Laterality: Right;   Patient Active Problem List   Diagnosis Date Noted   Dysphagia 07/24/2022   Flatulence, eructation and gas pain 07/24/2022   Hip pain 07/24/2022   Left lower quadrant pain 07/24/2022   Aortic valve disease 04/04/2022   Closed left hip fracture (HCC) 04/02/2022   Aortic aneurysm (HCC) 04/02/2022   Dementia (HCC) 01/21/2022   Post-nasal drip 12/20/2021   Gastroesophageal reflux disease without esophagitis 12/20/2021   Depression with anxiety 12/20/2021   Cellulitis of right leg 12/20/2021   Primary insomnia 12/20/2021   Hyperlipidemia 12/20/2021   Conjunctivitis, chronic, follicular 03/06/2021   Osteoarthritis of right glenohumeral joint 04/04/2020   Coronary artery calcification 12/18/2019   Aortic atherosclerosis (HCC) 12/18/2019   Pure hypercholesterolemia 12/18/2019   Tear of lateral meniscus of knee 01/21/2019   Senile osteopenia 03/20/2018   H/O temporal arteritis 03/20/2018   Memory loss, short term 03/20/2018   Weight loss 03/20/2018   Nausea 03/20/2018   Mixed urge and stress incontinence 03/20/2018  Multinodular goiter (nontoxic) 03/20/2018   RLS (restless legs syndrome) 12/19/2017   Pain in right knee 10/09/2017   Ectopic atrial tachycardia (HCC) 10/08/2016   Cough 09/06/2015   Hoarseness of voice 09/06/2015   Rhinitis, chronic 09/06/2015   Bilateral occipital neuralgia 06/16/2015   Convergence insufficiency 08/09/2014   Neck pain 03/05/2013   Capsular glaucoma with pseudoexfoliation of lens, left eye, moderate stage 12/22/2012   Right temporal headache 06/30/2012   Family history of coronary artery disease 09/24/2011   S/P TKR (total knee replacement) 09/24/2011   Tachycardia 08/10/2011   OA (osteoarthritis)  of knee 07/04/2011    ONSET DATE: "several months ago"  REFERRING DIAG: G20.A1 (ICD-10-CM) - Parkinson disease, symptomatic (HCC) R26.9 (ICD-10-CM) - Gait disorder W19.Benny Braver (ICD-10-CM) - Fall, initial encounter  THERAPY DIAG:  Difficulty in walking, not elsewhere classified  Muscle weakness (generalized)  Other abnormalities of gait and mobility  Unsteadiness on feet  Rationale for Evaluation and Treatment: Rehabilitation  SUBJECTIVE:                                                                                                                                                                                             SUBJECTIVE STATEMENT: Doing well, no issues Pt accompanied by: self  PERTINENT HISTORY: is going to assisted living at [facility]. She fell she tripped over a chandelier overnight. She was going to bed. No freezing episodes, shuffling a little bit more  PAIN:  Are you having pain? Yes: NPRS scale: 2/10 Pain location: R thumb Pain description: sore Aggravating factors: "touching" Relieving factors: heat  PRECAUTIONS: Fall  RED FLAGS: None   WEIGHT BEARING RESTRICTIONS: No  FALLS: Has patient fallen in last 6 months? Yes. Number of falls broke her left hip in a fall x 1 year ago. One fall in past 6 moths  LIVING ENVIRONMENT: Lives with: lives alone Lives in: Other [facility] in ALF, assist for medication, and supervision for bathing Stairs:   Has following equipment at home: Single point cane and Environmental consultant - 4 wheeled  PLOF: Independent with community mobility with device and Needs assistance with homemaking  PATIENT GOALS:   OBJECTIVE:   TODAY'S TREATMENT: 07/17/23 Activity Comments  Dynamic balance/gait warm-up -retrowalk -walk and vertical ball toss -high step march  Static balance standing on foam Performing throwing to targets with bean bags and medicine balls  Standing PWR moves 1x10 -slow rehearsal w/ visual cues -faster pace w/ yellow  t-band resist  Dynamic gait -large amplitude arm swing -Highstepping over 6" hurdles and foam cushion forwards and lateral  Gastroc stretch 2x30 sec R/L  HOME EXERCISE PROGRAM Last updated: 06/26/23 Access Code: [email or web address] URL: [email or web address] Date: 06/26/2023 Prepared by: [facility] - Outpatient  Rehab - [facility]  Exercises - Sit to Stand with Arms Crossed  - 1 x daily - 5 x weekly - 2 sets - 10 reps - Seated Hamstring Stretch  - 1 x daily - 5 x weekly - 2 sets - 30 sec hold  Standing PWR moves 10x each, 1-2x/day at counter top     PATIENT EDUCATION: Education details: handout on standing PWR moves and edu for safety and HEP update Person educated: Patient Education method: Explanation, Demonstration, Tactile cues, Verbal cues, and Handouts Education comprehension: verbalized understanding and returned demonstration    Note: Objective measures were completed at Evaluation unless otherwise noted.  DIAGNOSTIC FINDINGS:   COGNITION: Overall cognitive status: unable to perform 3-item recall after 1-2 min, requiring prompts 2/3 recall   SENSATION: WFL  COORDINATION: Difficulty with rapid alternating movement Heel to shin grossly impaired Finger to nose WNL    MUSCLE TONE: increased tone to RUE flexors and right hamstrings    POSTURE: rounded shoulders, forward head, and increased thoracic kyphosis  LOWER EXTREMITY ROM:     Active  Right Eval Left Eval  Hip flexion    Hip extension    Hip abduction    Hip adduction    Hip internal rotation    Hip external rotation    Knee flexion 120 120  Knee extension -5 0  Ankle dorsiflexion 15 15  Ankle plantarflexion    Ankle inversion    Ankle eversion     (Blank rows = not tested)  LOWER EXTREMITY MMT:    4/5 gross BLE  BED MOBILITY:  Not tested, reports modified independence use bed rails  TRANSFERS: independent    CURB:  Findings: CGA  STAIRS: Not  tested GAIT: Findings: Distance walked:   and Comments: narrow BOS, 5-6 steps for turning  FUNCTIONAL TESTS:  5 times sit to stand: 12 sec--difficulty with full upright posture/extension Mini-BESTest: 16/28 3 Meter Backwards Walk Test: NT 10 Meter Walk Test: 16 sec - 2.0 ft/sec                                                                                                                                 TREATMENT DATE:     PATIENT EDUCATION: Education details: assessment details, rationale of PT intervention Person educated: Patient Education method: Explanation Education comprehension: verbalized understanding  HOME EXERCISE PROGRAM: TBD  GOALS: Goals reviewed with patient? Yes  SHORT TERM GOALS: Target date: 07/03/2023    Patient will be independent in HEP to improve functional outcomes Baseline: Goal status: MET    LONG TERM GOALS: Target date: 07/24/2023    Demo improved motor control and reduced risk for falls per score 24/28 Mini-BESTest Baseline: 16/28 Goal status: IN PROGRESS  2.  Perform 3 Meter Backwards Walk Test in 4.8 sec to reduce risk for  falls Baseline: 8.4 sec Goal status: IN PROGRESS  3.  Teach-back relevant strategies for exercise interventions as it pertains to PD Baseline:  Goal status: IN PROGRESS  4.  Maintain gait speed of 2.8 ft/sec to improve efficiency of community ambulation Baseline: 2.0 ft/sec Goal status: IN PROGRESS    ASSESSMENT:  CLINICAL IMPRESSION: Dynamic balance/gait activities to improve ambulation under various demands and emphasis on single limb support. Balance activities to improve righting reactions and anticipatory responses against perturbations.  Large amplitude movements to improve coordination and strength requiring 50% cues for form/sequence and progressed to resistance.  Continued sessions to progress POC details to improve mobility and reduce risk for falls  OBJECTIVE IMPAIRMENTS: Abnormal gait, decreased  activity tolerance, decreased balance, decreased coordination, decreased mobility, difficulty walking, decreased strength, impaired tone, and postural dysfunction.   ACTIVITY LIMITATIONS: carrying, lifting, bending, squatting, transfers, and locomotion level  PARTICIPATION LIMITATIONS: community activity and exercise classes  PERSONAL FACTORS: Age, Time since onset of injury/illness/exacerbation, and 3+ comorbidities: PMH are also affecting patient's functional outcome.   REHAB POTENTIAL: Good  CLINICAL DECISION MAKING: Evolving/moderate complexity  EVALUATION COMPLEXITY: Moderate  PLAN:  PT FREQUENCY: 1-2x/week  PT DURATION: 6 weeks  PLANNED INTERVENTIONS: 97750- Physical Performance Testing, 97110-Therapeutic exercises, 97530- Therapeutic activity, [id number]- Neuromuscular re-education, 97535- Self Care, 54098- Manual therapy, [id number]- Gait training, [phone]- Canalith repositioning, and [phone]- Aquatic Therapy  PLAN FOR NEXT SESSION: progress to standing PWR moves FLOW; work on posture, speed of movement   11:43 AM, 07/17/23 M. Kelly Kasie Leccese, PT, DPT Physical Therapist- [facility] Office Number: [phone]

## 2023-07-19 ENCOUNTER — Encounter: Payer: Self-pay | Admitting: Physical Therapy

## 2023-07-19 ENCOUNTER — Ambulatory Visit: Admitting: Physical Therapy

## 2023-07-19 DIAGNOSIS — R1310 Dysphagia, unspecified: Secondary | ICD-10-CM | POA: Diagnosis not present

## 2023-07-19 DIAGNOSIS — R2681 Unsteadiness on feet: Secondary | ICD-10-CM

## 2023-07-19 DIAGNOSIS — R471 Dysarthria and anarthria: Secondary | ICD-10-CM | POA: Diagnosis not present

## 2023-07-19 DIAGNOSIS — R262 Difficulty in walking, not elsewhere classified: Secondary | ICD-10-CM | POA: Diagnosis not present

## 2023-07-19 DIAGNOSIS — R2689 Other abnormalities of gait and mobility: Secondary | ICD-10-CM

## 2023-07-19 DIAGNOSIS — M6281 Muscle weakness (generalized): Secondary | ICD-10-CM | POA: Diagnosis not present

## 2023-07-19 NOTE — Therapy (Signed)
 OUTPATIENT PHYSICAL THERAPY NEURO TREATMENT   Patient Name: Janet Wilson MRN: [id number] DOB:10/22/1934, 89 y.o., female Today's Date: 07/20/2023   PCP: Marguerite Shiley, MD REFERRING PROVIDER: Glory Larsen, MD  END OF SESSION:  PT End of Session - 07/19/23 1107     Visit Number 8    Number of Visits 13    Date for PT Re-Evaluation 07/24/23    Authorization Type [employer]    Progress Note Due on Visit 10    PT Start Time 1107    PT Stop Time 1145    PT Time Calculation (min) 38 min    Equipment Utilized During Treatment Gait belt    Activity Tolerance Patient tolerated treatment well    Behavior During Therapy WFL for tasks assessed/performed               Past Medical History:  Diagnosis Date   Aortic aneurysm (HCC)    Arthritis    OA BOTH KNEES AND HANDS   Atrial tachycardia (HCC)    HX OF   GERD (gastroesophageal reflux disease)    Glaucoma    left eye   Goiter    CAUSING COUGH, HOARSINESS AND DRY THROAT   Headache(784.0)    MIGRAINES    Hyperlipidemia    Neuralgia    Overactive bladder    RLS (restless legs syndrome)    Temporal arteritis (HCC)    Past Surgical History:  Procedure Laterality Date   ARTERY BIOPSY Right 07/21/2012   Procedure: BIOPSY TEMPORAL ARTERY;  Surgeon: Darcella Earnest, MD;  Location: [facility] OR;  Service: General;  Laterality: Right;   CERVICAL FUSION  2012   Dr. Nigel Bart   EYE SURGERY     CATARACT EXTRACTION- BILATERAL   INCONTINENCE SURGERY     JOINT REPLACEMENT     right knee   LEFT SHOULDER SURGERY     RIGHT KNEE ARTHROSCOPY  11/2009     ROBOTIC ASSISTED BILATERAL SALPINGO OOPHERECTOMY Bilateral 12/08/2013   Procedure: ROBOTIC ASSISTED LAPAROSCOPIC BILATERAL SALPINGO OOPHORECTOMY WITH STAGING;  Surgeon: Alphonso Aschoff, MD;  Location: [facility];  Service: Gynecology;  Laterality: Bilateral;   TOTAL HIP ARTHROPLASTY Left 04/03/2022   Procedure: TOTAL HIP ARTHROPLASTY ANTERIOR APPROACH;  Surgeon:  Saundra Curl, MD;  Location: [facility];  Service: Orthopedics;  Laterality: Left;   TOTAL KNEE ARTHROPLASTY  07/04/2011   Procedure: TOTAL KNEE ARTHROPLASTY;  Surgeon: Aurther Blue, MD;  Location: [facility];  Service: Orthopedics;  Laterality: Right;   Patient Active Problem List   Diagnosis Date Noted   Dysphagia 07/24/2022   Flatulence, eructation and gas pain 07/24/2022   Hip pain 07/24/2022   Left lower quadrant pain 07/24/2022   Aortic valve disease 04/04/2022   Closed left hip fracture (HCC) 04/02/2022   Aortic aneurysm (HCC) 04/02/2022   Dementia (HCC) 01/21/2022   Post-nasal drip 12/20/2021   Gastroesophageal reflux disease without esophagitis 12/20/2021   Depression with anxiety 12/20/2021   Cellulitis of right leg 12/20/2021   Primary insomnia 12/20/2021   Hyperlipidemia 12/20/2021   Conjunctivitis, chronic, follicular 03/06/2021   Osteoarthritis of right glenohumeral joint 04/04/2020   Coronary artery calcification 12/18/2019   Aortic atherosclerosis (HCC) 12/18/2019   Pure hypercholesterolemia 12/18/2019   Tear of lateral meniscus of knee 01/21/2019   Senile osteopenia 03/20/2018   H/O temporal arteritis 03/20/2018   Memory loss, short term 03/20/2018   Weight loss 03/20/2018   Nausea 03/20/2018   Mixed urge and stress incontinence 03/20/2018  Multinodular goiter (nontoxic) 03/20/2018   RLS (restless legs syndrome) 12/19/2017   Pain in right knee 10/09/2017   Ectopic atrial tachycardia (HCC) 10/08/2016   Cough 09/06/2015   Hoarseness of voice 09/06/2015   Rhinitis, chronic 09/06/2015   Bilateral occipital neuralgia 06/16/2015   Convergence insufficiency 08/09/2014   Neck pain 03/05/2013   Capsular glaucoma with pseudoexfoliation of lens, left eye, moderate stage 12/22/2012   Right temporal headache 06/30/2012   Family history of coronary artery disease 09/24/2011   S/P TKR (total knee replacement) 09/24/2011   Tachycardia 08/10/2011   OA (osteoarthritis)  of knee 07/04/2011    ONSET DATE: "several months ago"  REFERRING DIAG: G20.A1 (ICD-10-CM) - Parkinson disease, symptomatic (HCC) R26.9 (ICD-10-CM) - Gait disorder W19.Benny Braver (ICD-10-CM) - Fall, initial encounter  THERAPY DIAG:  Unsteadiness on feet  Other abnormalities of gait and mobility  Rationale for Evaluation and Treatment: Rehabilitation  SUBJECTIVE:                                                                                                                                                                                             SUBJECTIVE STATEMENT: 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   Pt accompanied by: self  PERTINENT HISTORY: is going to assisted living at [facility]. She fell she tripped over a chandelier overnight. She was going to bed. No freezing episodes, shuffling a little bit more  PAIN:  Are you having pain? No pain  PRECAUTIONS: Fall  RED FLAGS: None   WEIGHT BEARING RESTRICTIONS: No  FALLS: Has patient fallen in last 6 months? Yes. Number of falls broke her left hip in a fall x 1 year ago. One fall in past 6 moths  LIVING ENVIRONMENT: Lives with: lives alone Lives in: Other [facility] in ALF, assist for medication, and supervision for bathing Stairs:   Has following equipment at home: Single point cane and Environmental consultant - 4 wheeled  PLOF: Independent with community mobility with device and Needs assistance with homemaking  PATIENT GOALS:   OBJECTIVE:    TODAY'S TREATMENT: 07/19/2023 Activity Comments  Dynamic balance/gait warm up Forward gait with arm swing Forward/back gait   Sit to stand with PWR! Up posture   Standing PWR! Moves:   PWR! Up x 10 PWR! Rock x 10 PWR! Twist x 10 PWR! Step x 10 2 sets 2nd set with yellow band for PWR! Up and Rock UE movements  Seated hamstring stretch, 2 x 30" Standing gastroc stretch, 2 x 30"   Standing on Airex: Feet apart/together EO/EC head  motions Alt UE reaching up, sideways,  open for trunk rotation   Standing on Airex: Forward step and weightshift Back step and weightshift Side step and weightshift   Step over obstacles Decreased foot clearance catching obstacles         HOME EXERCISE PROGRAM Last updated: 06/26/23 Access Code: [email or web address] URL: [email or web address] Date: 06/26/2023 Prepared by: [facility] Out[facility]  Re[facility] - [facility]  Exercises - Sit to Stand with Arms Crossed  - 1 x daily - 5 x weekly - 2 sets - 10 reps - Seated Hamstring Stretch  - 1 x daily - 5 x weekly - 2 sets - 30 sec hold  Standing PWR moves 10x each, 1-2x/day at counter top     PATIENT EDUCATION: Education details: Continue current HEP and use yellow band for PWR! Up and Rock for posture training/upper body work Person educated: Patient Education method: Programmer, multimedia, Facilities manager, Actor cues, Verbal cues, and Handouts Education comprehension: verbalized understanding and returned demonstration    Note: Objective measures were completed at Evaluation unless otherwise noted.  DIAGNOSTIC FINDINGS:   COGNITION: Overall cognitive status: unable to perform 3-item recall after 1-2 min, requiring prompts 2/3 recall   SENSATION: WFL  COORDINATION: Difficulty with rapid alternating movement Heel to shin grossly impaired Finger to nose WNL    MUSCLE TONE: increased tone to RUE flexors and right hamstrings    POSTURE: rounded shoulders, forward head, and increased thoracic kyphosis  LOWER EXTREMITY ROM:     Active  Right Eval Left Eval  Hip flexion    Hip extension    Hip abduction    Hip adduction    Hip internal rotation    Hip external rotation    Knee flexion 120 120  Knee extension -5 0  Ankle dorsiflexion 15 15  Ankle plantarflexion    Ankle inversion    Ankle eversion     (Blank rows = not tested)  LOWER EXTREMITY MMT:    4/5 gross BLE  BED MOBILITY:  Not tested, reports  modified independence use bed rails  TRANSFERS: independent    CURB:  Findings: CGA  STAIRS: Not tested GAIT: Findings: Distance walked:   and Comments: narrow BOS, 5-6 steps for turning  FUNCTIONAL TESTS:  5 times sit to stand: 12 sec--difficulty with full upright posture/extension Mini-BESTest: 16/28 3 Meter Backwards Walk Test: NT 10 Meter Walk Test: 16 sec - 2.0 ft/sec                                                                                                                                 TREATMENT DATE:     PATIENT EDUCATION: Education details: assessment details, rationale of PT intervention Person educated: Patient Education method: Explanation Education comprehension: verbalized understanding  HOME EXERCISE PROGRAM: TBD  GOALS: Goals reviewed with patient? Yes  SHORT TERM GOALS: Target date: 07/03/2023    Patient will be independent in HEP to improve functional outcomes Baseline: Goal status: MET  LONG TERM GOALS: Target date: 07/24/2023    Demo improved motor control and reduced risk for falls per score 24/28 Mini-BESTest Baseline: 16/28 Goal status: IN PROGRESS  2.  Perform 3 Meter Backwards Walk Test in 4.8 sec to reduce risk for falls Baseline: 8.4 sec Goal status: IN PROGRESS  3.  Teach-back relevant strategies for exercise interventions as it pertains to PD Baseline:  Goal status: IN PROGRESS  4.  Maintain gait speed of 2.8 ft/sec to improve efficiency of community ambulation Baseline: 2.0 ft/sec Goal status: IN PROGRESS    ASSESSMENT:  CLINICAL IMPRESSION: Pt presents today with no new complaints. Skilled PT session focused on balance and posture work. Utilized yellow theraband for resisted upper body work with [employer]! Up and Rock for postural retraining.  Also worked on compliant surfaces, with pt needing UE support for step and weigthshift activities.  With obstacle negotiation, episodes of decreased foot clearance about 50%  of the time. Pt will continue to benefit from skilled PT towards goals for improved functional mobility and decreased fall risk.   OBJECTIVE IMPAIRMENTS: Abnormal gait, decreased activity tolerance, decreased balance, decreased coordination, decreased mobility, difficulty walking, decreased strength, impaired tone, and postural dysfunction.   ACTIVITY LIMITATIONS: carrying, lifting, bending, squatting, transfers, and locomotion level  PARTICIPATION LIMITATIONS: community activity and exercise classes  PERSONAL FACTORS: Age, Time since onset of injury/illness/exacerbation, and 3+ comorbidities: PMH are also affecting patient's functional outcome.   REHAB POTENTIAL: Good  CLINICAL DECISION MAKING: Evolving/moderate complexity  EVALUATION COMPLEXITY: Moderate  PLAN:  PT FREQUENCY: 1-2x/week  PT DURATION: 6 weeks  PLANNED INTERVENTIONS: 97750- Physical Performance Testing, 97110-Therapeutic exercises, 97530- Therapeutic activity, 97112- Neuromuscular re-education, 97535- Self Care, [id number]- Manual therapy, [phone]- Gait training, [phone]- Canalith repositioning, and [id number]- Aquatic Therapy  PLAN FOR NEXT SESSION: Check LTGs and discuss POC next week; try standing PWR moves FLOW; work on posture, speed of movement towards goals   Dessie Flow, PT 07/20/23 10:50 AM Phone: [phone] Fax: [phone]  [facility] [address] [place], [place] [address] Phone # [phone] Fax # [phone]

## 2023-07-24 ENCOUNTER — Ambulatory Visit

## 2023-07-24 DIAGNOSIS — M6281 Muscle weakness (generalized): Secondary | ICD-10-CM

## 2023-07-24 DIAGNOSIS — R2689 Other abnormalities of gait and mobility: Secondary | ICD-10-CM

## 2023-07-24 DIAGNOSIS — R262 Difficulty in walking, not elsewhere classified: Secondary | ICD-10-CM

## 2023-07-24 DIAGNOSIS — R471 Dysarthria and anarthria: Secondary | ICD-10-CM

## 2023-07-24 DIAGNOSIS — R1310 Dysphagia, unspecified: Secondary | ICD-10-CM | POA: Diagnosis not present

## 2023-07-24 DIAGNOSIS — R41841 Cognitive communication deficit: Secondary | ICD-10-CM

## 2023-07-24 DIAGNOSIS — R2681 Unsteadiness on feet: Secondary | ICD-10-CM

## 2023-07-24 NOTE — Therapy (Signed)
 OUTPATIENT SPEECH LANGUAGE PATHOLOGY PARKINSON'S TREATMENT   Patient Name: Janet Wilson MRN: [id number] DOB:[birth year], 89 y.o., female Today's Date: 07/24/2023  PCP: Janet Mowers, MD REFERRING PROVIDER: Glory Larsen, MD  END OF SESSION:  End of Session - 07/24/23 1153     Visit Number 5    Number of Visits 17    Date for SLP Re-Evaluation 08/30/23    SLP Start Time 1104    SLP Stop Time  1145    SLP Time Calculation (min) 41 min    Activity Tolerance Patient tolerated treatment well                 Past Medical History:  Diagnosis Date   Aortic aneurysm (HCC)    Arthritis    OA BOTH KNEES AND HANDS   Atrial tachycardia (HCC)    HX OF   GERD (gastroesophageal reflux disease)    Glaucoma    left eye   Goiter    CAUSING COUGH, HOARSINESS AND DRY THROAT   Headache(784.0)    MIGRAINES    Hyperlipidemia    Neuralgia    Overactive bladder    RLS (restless legs syndrome)    Temporal arteritis (HCC)    Past Surgical History:  Procedure Laterality Date   ARTERY BIOPSY Right 07/21/2012   Procedure: BIOPSY TEMPORAL ARTERY;  Surgeon: Darcella Earnest, MD;  Location: [facility] OR;  Service: General;  Laterality: Right;   CERVICAL FUSION  2012   Dr. Nigel Wilson   EYE SURGERY     CATARACT EXTRACTION- BILATERAL   INCONTINENCE SURGERY     JOINT REPLACEMENT     right knee   LEFT SHOULDER SURGERY     RIGHT KNEE ARTHROSCOPY  11/2009     ROBOTIC ASSISTED BILATERAL SALPINGO OOPHERECTOMY Bilateral 12/08/2013   Procedure: ROBOTIC ASSISTED LAPAROSCOPIC BILATERAL SALPINGO OOPHORECTOMY WITH STAGING;  Surgeon: Janet Aschoff, MD;  Location: [facility];  Service: Gynecology;  Laterality: Bilateral;   TOTAL HIP ARTHROPLASTY Left 04/03/2022   Procedure: TOTAL HIP ARTHROPLASTY ANTERIOR APPROACH;  Surgeon: Janet Curl, MD;  Location: [facility];  Service: Orthopedics;  Laterality: Left;   TOTAL KNEE ARTHROPLASTY  07/04/2011   Procedure: TOTAL KNEE ARTHROPLASTY;  Surgeon: Janet Blue, MD;  Location: [facility];  Service: Orthopedics;  Laterality: Right;   Patient Active Problem List   Diagnosis Date Noted   Dysphagia 07/24/2022   Flatulence, eructation and gas pain 07/24/2022   Hip pain 07/24/2022   Left lower quadrant pain 07/24/2022   Aortic valve disease 04/04/2022   Closed left hip fracture (HCC) 04/02/2022   Aortic aneurysm (HCC) 04/02/2022   Dementia (HCC) 01/21/2022   Post-nasal drip 12/20/2021   Gastroesophageal reflux disease without esophagitis 12/20/2021   Depression with anxiety 12/20/2021   Cellulitis of right leg 12/20/2021   Primary insomnia 12/20/2021   Hyperlipidemia 12/20/2021   Conjunctivitis, chronic, follicular 03/06/2021   Osteoarthritis of right glenohumeral joint 04/04/2020   Coronary artery calcification 12/18/2019   Aortic atherosclerosis (HCC) 12/18/2019   Pure hypercholesterolemia 12/18/2019   Tear of lateral meniscus of knee 01/21/2019   Senile osteopenia 03/20/2018   H/O temporal arteritis 03/20/2018   Memory loss, short term 03/20/2018   Weight loss 03/20/2018   Nausea 03/20/2018   Mixed urge and stress incontinence 03/20/2018   Multinodular goiter (nontoxic) 03/20/2018   RLS (restless legs syndrome) 12/19/2017   Pain in right knee 10/09/2017   Ectopic atrial tachycardia (HCC) 10/08/2016   Cough 09/06/2015   Hoarseness of  voice 09/06/2015   Rhinitis, chronic 09/06/2015   Bilateral occipital neuralgia 06/16/2015   Convergence insufficiency 08/09/2014   Neck pain 03/05/2013   Capsular glaucoma with pseudoexfoliation of lens, left eye, moderate stage 12/22/2012   Right temporal headache 06/30/2012   Family history of coronary artery disease 09/24/2011   S/P TKR (total knee replacement) 09/24/2011   Tachycardia 08/10/2011   OA (osteoarthritis) of knee 07/04/2011    ONSET DATE: About 4 years ago Script 05/13/23  REFERRING DIAG: R47.1 (ICD-10-CM) - Dysarthria R49.8 (ICD-10-CM) - Hypophonia G20.A1 (ICD-10-CM) -  Parkinson disease, symptomatic (HCC)  THERAPY DIAG:  Dysarthria and anarthria  Cognitive communication deficit  Rationale for Evaluation and Treatment: Rehabilitation  SUBJECTIVE:   SUBJECTIVE STATEMENT: It helps that I'm conscious about (my speech). Pt indicated last night she was forceful when talking with husband and he understood her more than if she were not more mindful of her speech production.  Pt accompanied by: self  PERTINENT HISTORY: See above for PMHx 05/13/2023: Janet Wilson) DAT scan positive for Parkinson's Disorder likely idiopathic Parkinson's disease. Her speech pattern is messy, having problems speaking. She was evaluated for speech at [facility]. She is involved in [employer]. Discussed exercise is the only thing known to slow down progression of parkinson's disease. She is doing that 2x a week. Her husband went to memory care and he is at rehab and then going to skilled care. Wife is going to assisted living at [facility]. She fell she tripped over a chandelier overnight. She was going to bed. No freezing episodes, shuffling a little bit more, Drinks out of a straw. She had an experience with a slider, she took a slice and she couldn;t swallow, got lodged at the back of her throat, has to drink out of a straw. She declines a swallow study, states she is being more careful and chews thoroughly. [facility] has a dermatologist, discussed PT at [facility].   PAIN:  Are you having pain? Yes: NPRS scale: 2/10 Pain location: rt shoulder Pain description: sore Aggravating factors: certain movement Relieving factors: not moving it  FALLS: Has patient fallen in last 6 months?  See PT evaluation for details   PATIENT GOALS: I'd like to talk louder.  OBJECTIVE:  Note: Objective measures were completed at Evaluation unless otherwise noted.  DIAGNOSTIC FINDINGS:  DaTSCAN  IMPRESSION: Bilateral reduced radiotracer activity within the striata. Greater loss of activity in the  RIGHT striatum. Pattern is typical of Parkinsonian syndrome pathology.   Of note, DaTSCAN  is not diagnostic of Parkinsonian syndromes, which remains a clinical diagnosis. DaTscan  is an adjuvant test to aid in the clinical diagnosis of Parkinsonian syndromes.     PATIENT REPORTED OUTCOME MEASURES (PROM): Communication Effectiveness Survey: 15/32 with higher score indicating more effective communication and greater QOL.  TREATMENT DATE:   07/24/23: SPEECH: Pt began everyday sentences with average 74dB and req'd mod cues from SLP to increase volume of these sentences. SLP wonders if pt is practicing at an adequately loud level for her practice, and if she is practicing BID. SLP engaged pt in short conversational segments of 30-60 seconds x6, with pt loudness average 70dB initially and fading in average loudness with subsequent trials requiring SLP min progressing to mod cues in order to maintain average at low-70s dB. SWALLOWING: Pt mentioned she had a scary episode with shrimp fettuccine last week where she had to regurgitate a piece of shrimp due to decr'd pharyngeal clearance and beginning of choking with a bite of shrimp and noodles. SLP strongly urged pt to have one type of food at one time, chew thoroughly, swallow with intent. Secondly have liquid or solid at one time, and not together. SLP will practice this with pt next session.  07/17/23: SPEECH: SLP spent approx 10 minutes shaping pt's loud /a/ after HEY! (HEY! Ahhhhhhhh--) to encourage louder speech production with /a/. She initially req'd consistent max cues faded to occasional mod cues for what to say, and for loudness. In word responses using simple problem solving/reasoning stimuli (3 clues task) pt req'd usual mod cues initially, faded to occasional min A. Pt was told to perform everyday sentences at home BID so  that SLP can cont to shape /a/ in sessions.    07/12/23: SPEECH: Pt was assessed using her everyday sentences, mod cues initially for volume and intent. Simple answers to wh questions req'd occasional mod cues for incr'd volume and purpose/intent. This carried over into pt's conversation on the way out of ST room today and on her phone call asking for a pick up.   SWALLOWING: BSE today Dentition: adequate natural dentition Vocal quality at baseline: harsh and low vocal intensity Patient directly observed with POs: Yes: regular, dysphagia 3 (soft), and thin liquids  Feeding: able to feed self Liquids provided by: cup - coffee, due to pt commenting her coughing occurs mostly with coffee Oral phase signs and symptoms: munching chewing pattern Pharyngeal phase signs and symptoms: multiple swallows and complaints of residue with peanut butter crackers, cleared with liquid wash   Comments: Pt coughed with 1/7 sips coffee. Slurping noted. SLP shared the concept of chewing with intent and pt naturally developed 75% rotary pattern. When cued to swallow with intent prior to 3 subsequent liquid swallows, she indicated better swallowing function (smoother) than her normal swallow. Pt req'd usual faded to occasional min cues to chew and swallow with intent spontaneously. SLP to cont to treat - goal added. MBS likely not necessary at this time.   07/10/23: Needs BSE and PROM next session. SLP and pt, given the time since evaluation and her S statement, had long discussion re: pt's need to talk almost like she feels like she is shouting. SLP used digital recorder with her today and showed pt that when she speaks like this it is actually WNL volume. SLP and pt generated 9 everyday sentences that pt was instructed to shout BID.  06/12/23:  SLP introduced pt to role of SLP in her course of ST. Pt req'd usual  mod cues to shape her loud /a/, as well as usual mod-max cues to use intentional, stronger speech in  some diagnostic therapy for louder speech. SLP educated pt that she needs to feel like she is shouting for her to be WNL volume. SLP provided examples with auditory recordings  of pt for this. She will need further instruction with this.   PATIENT EDUCATION: Education details: see treatment date for details.  Person educated: Patient Education method: Explanation, Demonstration, and Verbal cues Education comprehension: verbalized understanding, returned demonstration, verbal cues required, and needs further education  HOME EXERCISE PROGRAM: Eventually loud /a/, and everyday sentences  GOALS: Goals reviewed with patient? No   SHORT TERM GOALS: Target date:  08/02/23 (due to visits)   Produce /a/ with average 85 dB over three sessions Baseline: Goal status: INITIAL   2.  pt will generate average 69dB when responding with single sentences, x 3 sessions  Baseline:  Goal status: INITIAL   3.  pt will generate 2 minutes simple conversation with 69dB average in 3 sessions  Baseline:  Goal status: INITIAL  4. Pt will demo knowledge of suboptimal speech volume with nonverbal cue by repeating last utterance 70% of the time in 3 sessions   Baseline:  Goal status: INITIAL  5.  Pt will undergo bedside swallow evaluation in first 3 sessions Baseline:  Goal status: met  6  Pt will report using greater use of intent/purpose when swallowing than prior to ST, with rare min A Baseline:  Goal status: INITIAL     LONG TERM GOALS: Target date: 08/16/23   pt will produce average 69dB  in 7 minutes simple conversation in 3 sessions  Baseline:  Goal status: INITIAL   2.  pt will maintain upper 60s dB average in 10 minute simple conversation x 3 sessions  Baseline:  Goal status: INITIAL   3.  pt will report fewer requests to repeat herself than prior to ST in 3 sessions  Baseline:  Goal status: INITIAL   4. Pt will demo knowledge of suboptimal speech volume with nonverbal cue by repeating  last utterance 80% of the time in 3 sessions   Baseline:  Goal status: INITIAL  5.  Pt will score higher/better on PROM in the last 2 weeks of therapy Baseline:  Goal status: INITIAL  6.  Pt will spontaneously use rotary pattern chewing 75% of the time in 3 sessions Baseline:  Goal status: INITIAL  7.  Pt will report using greater use of intent/purpose when swallowing with mod I than prior to ST Baseline:  Goal status: INITIAL  ASSESSMENT:  CLINICAL IMPRESSION: Patient is a 89 y.o. F who was seen today for treatment of speech due to hypophonic voice in light of Parkinson's disease with sx for approx 4 years, according to pt. See treatment date above for today's date for further details on today's session. During eval, SLP and pt agreed a more traditional approach may need to be taken focusing on louder speech instead of following a highly structured therapy framework such as SPEAKOUT. OT evaluation should be considered referral was sent by referring MD. Pt needs to schedule OT eval if she desires.   OBJECTIVE IMPAIRMENTS: Objective impairments include attention, memory, dysarthria, and dysphagia. These impairments are limiting patient from managing medications, managing appointments, managing finances, household responsibilities, ADLs/IADLs, effectively communicating at home and in community, and safety when swallowing.Factors affecting potential to achieve goals and functional outcome are ability to learn/carryover information, cooperation/participation level, and severity of impairments.. Patient will benefit from skilled SLP services to address above impairments and improve overall function.  REHAB POTENTIAL: Fair to Good due to above factors  PLAN:  SLP FREQUENCY: 2x/week  SLP DURATION: 8 weeks  PLANNED INTERVENTIONS: Aspiration precaution training, Pharyngeal strengthening exercises, Diet toleration management ,  Language facilitation, Environmental controls, Trials of upgraded  texture/liquids, Cueing hierachy, Cognitive reorganization, Internal/external aids, Oral motor exercises, Functional tasks, Multimodal communication approach, SLP instruction and feedback, Compensatory strategies, Patient/family education, [phone] Treatment of speech (30 or 45 min) , and [id number] Treatment of swallowing function    Zayden Maffei, CCC-SLP 07/24/2023, 11:53 AM

## 2023-07-24 NOTE — Therapy (Signed)
 OUTPATIENT PHYSICAL THERAPY NEURO TREATMENT, Progress Note, and Recertification   Patient Name: Janet Wilson MRN: [id number] DOB:1934/04/12, 89 y.o., female Today's Date: 07/24/2023   PCP: Marguerite Shiley, MD REFERRING PROVIDER: Glory Larsen, MD  Progress Note Reporting Period 06/12/23 to 07/24/23  See note below for Objective Data and Assessment of Progress/Goals.     END OF SESSION:  PT End of Session - 07/24/23 1149     Visit Number 9    Number of Visits 14    Date for PT Re-Evaluation 08/28/23    Authorization Type [employer]    Progress Note Due on Visit 19    PT Start Time 1145    PT Stop Time 1230    PT Time Calculation (min) 45 min    Equipment Utilized During Treatment Gait belt    Activity Tolerance Patient tolerated treatment well    Behavior During Therapy WFL for tasks assessed/performed               Past Medical History:  Diagnosis Date   Aortic aneurysm (HCC)    Arthritis    OA BOTH KNEES AND HANDS   Atrial tachycardia (HCC)    HX OF   GERD (gastroesophageal reflux disease)    Glaucoma    left eye   Goiter    CAUSING COUGH, HOARSINESS AND DRY THROAT   Headache(784.0)    MIGRAINES    Hyperlipidemia    Neuralgia    Overactive bladder    RLS (restless legs syndrome)    Temporal arteritis (HCC)    Past Surgical History:  Procedure Laterality Date   ARTERY BIOPSY Right 07/21/2012   Procedure: BIOPSY TEMPORAL ARTERY;  Surgeon: Darcella Earnest, MD;  Location: [facility] OR;  Service: General;  Laterality: Right;   CERVICAL FUSION  2012   Dr. Nigel Bart   EYE SURGERY     CATARACT EXTRACTION- BILATERAL   INCONTINENCE SURGERY     JOINT REPLACEMENT     right knee   LEFT SHOULDER SURGERY     RIGHT KNEE ARTHROSCOPY  11/2009     ROBOTIC ASSISTED BILATERAL SALPINGO OOPHERECTOMY Bilateral 12/08/2013   Procedure: ROBOTIC ASSISTED LAPAROSCOPIC BILATERAL SALPINGO OOPHORECTOMY WITH STAGING;  Surgeon: Alphonso Aschoff, MD;  Location:  [facility];  Service: Gynecology;  Laterality: Bilateral;   TOTAL HIP ARTHROPLASTY Left 04/03/2022   Procedure: TOTAL HIP ARTHROPLASTY ANTERIOR APPROACH;  Surgeon: Saundra Curl, MD;  Location: [facility];  Service: Orthopedics;  Laterality: Left;   TOTAL KNEE ARTHROPLASTY  07/04/2011   Procedure: TOTAL KNEE ARTHROPLASTY;  Surgeon: Aurther Blue, MD;  Location: [facility];  Service: Orthopedics;  Laterality: Right;   Patient Active Problem List   Diagnosis Date Noted   Dysphagia 07/24/2022   Flatulence, eructation and gas pain 07/24/2022   Hip pain 07/24/2022   Left lower quadrant pain 07/24/2022   Aortic valve disease 04/04/2022   Closed left hip fracture (HCC) 04/02/2022   Aortic aneurysm (HCC) 04/02/2022   Dementia (HCC) 01/21/2022   Post-nasal drip 12/20/2021   Gastroesophageal reflux disease without esophagitis 12/20/2021   Depression with anxiety 12/20/2021   Cellulitis of right leg 12/20/2021   Primary insomnia 12/20/2021   Hyperlipidemia 12/20/2021   Conjunctivitis, chronic, follicular 03/06/2021   Osteoarthritis of right glenohumeral joint 04/04/2020   Coronary artery calcification 12/18/2019   Aortic atherosclerosis (HCC) 12/18/2019   Pure hypercholesterolemia 12/18/2019   Tear of lateral meniscus of knee 01/21/2019   Senile osteopenia 03/20/2018   H/O temporal  arteritis 03/20/2018   Memory loss, short term 03/20/2018   Weight loss 03/20/2018   Nausea 03/20/2018   Mixed urge and stress incontinence 03/20/2018   Multinodular goiter (nontoxic) 03/20/2018   RLS (restless legs syndrome) 12/19/2017   Pain in right knee 10/09/2017   Ectopic atrial tachycardia (HCC) 10/08/2016   Cough 09/06/2015   Hoarseness of voice 09/06/2015   Rhinitis, chronic 09/06/2015   Bilateral occipital neuralgia 06/16/2015   Convergence insufficiency 08/09/2014   Neck pain 03/05/2013   Capsular glaucoma with pseudoexfoliation of lens, left eye, moderate stage 12/22/2012   Right temporal headache  06/30/2012   Family history of coronary artery disease 09/24/2011   S/P TKR (total knee replacement) 09/24/2011   Tachycardia 08/10/2011   OA (osteoarthritis) of knee 07/04/2011    ONSET DATE: several months ago  REFERRING DIAG: G20.A1 (ICD-10-CM) - Parkinson disease, symptomatic (HCC) R26.9 (ICD-10-CM) - Gait disorder [email or web address].Benny Braver (ICD-10-CM) - Fall, initial encounter  THERAPY DIAG:  Unsteadiness on feet  Other abnormalities of gait and mobility  Difficulty in walking, not elsewhere classified  Muscle weakness (generalized)  Rationale for Evaluation and Treatment: Rehabilitation  SUBJECTIVE:                                                                                                                                                                                             SUBJECTIVE STATEMENT: Doing ok, going to boxing 2x/wk  Pt accompanied by: self  PERTINENT HISTORY: is going to assisted living at [facility]. She fell she tripped over a chandelier overnight. She was going to bed. No freezing episodes, shuffling a little bit more  PAIN:  Are you having pain? No pain  PRECAUTIONS: Fall  RED FLAGS: None   WEIGHT BEARING RESTRICTIONS: No  FALLS: Has patient fallen in last 6 months? Yes. Number of falls broke her left hip in a fall x 1 year ago. One fall in past 6 moths  LIVING ENVIRONMENT: Lives with: lives alone Lives in: Other [facility] in ALF, assist for medication, and supervision for bathing Stairs:   Has following equipment at home: Single point cane and Environmental consultant - 4 wheeled  PLOF: Independent with community mobility with device and Needs assistance with homemaking  PATIENT GOALS:   OBJECTIVE:   TODAY'S TREATMENT: 07/24/23 Activity Comments  Seated PWR moves Standing PWR up 1x10   Mini-BESTest 22/28  5.3 sec avg of 3  Gait speed 2.2 ft/sec w/out AD 2.9 ft/sec w/ AD  POC review          TODAY'S TREATMENT: 07/19/2023 Activity Comments   Dynamic balance/gait warm up Forward gait with arm swing  Forward/back gait   Sit to stand with PWR! Up posture   Standing PWR! Moves:   PWR! Up x 10 PWR! Rock x 10 PWR! Twist x 10 PWR! Step x 10 2 sets 2nd set with yellow band for PWR! Up and Rock UE movements  Seated hamstring stretch, 2 x 30 Standing gastroc stretch, 2 x 30   Standing on Airex: Feet apart/together EO/EC head motions Alt UE reaching up, sideways, open for trunk rotation   Standing on Airex: Forward step and weightshift Back step and weightshift Side step and weightshift   Step over obstacles Decreased foot clearance catching obstacles         HOME EXERCISE PROGRAM Last updated: 06/26/23 Access Code: [email or web address] URL: [email or web address] Date: 06/26/2023 Prepared by: [facility] - [facility]  Exercises - Sit to Stand with Arms Crossed  - 1 x daily - 5 x weekly - 2 sets - 10 reps - Seated Hamstring Stretch  - 1 x daily - 5 x weekly - 2 sets - 30 sec hold  Standing PWR moves 10x each, 1-2x/day at counter top     PATIENT EDUCATION: Education details: Continue current HEP and use yellow band for PWR! Up and Rock for posture training/upper body work Person educated: Patient Education method: Programmer, multimedia, Facilities manager, Actor cues, Verbal cues, and Handouts Education comprehension: verbalized understanding and returned demonstration    Note: Objective measures were completed at Evaluation unless otherwise noted.  DIAGNOSTIC FINDINGS:   COGNITION: Overall cognitive status: unable to perform 3-item recall after 1-2 min, requiring prompts 2/3 recall   SENSATION: WFL  COORDINATION: Difficulty with rapid alternating movement Heel to shin grossly impaired Finger to nose WNL    MUSCLE TONE: increased tone to RUE flexors and right hamstrings    POSTURE: rounded shoulders, forward head, and increased thoracic kyphosis  LOWER EXTREMITY ROM:     Active   Right Eval Left Eval  Hip flexion    Hip extension    Hip abduction    Hip adduction    Hip internal rotation    Hip external rotation    Knee flexion 120 120  Knee extension -5 0  Ankle dorsiflexion 15 15  Ankle plantarflexion    Ankle inversion    Ankle eversion     (Blank rows = not tested)  LOWER EXTREMITY MMT:    4/5 gross BLE  BED MOBILITY:  Not tested, reports modified independence use bed rails  TRANSFERS: independent    CURB:  Findings: CGA  STAIRS: Not tested GAIT: Findings: Distance walked:   and Comments: narrow BOS, 5-6 steps for turning  FUNCTIONAL TESTS:  5 times sit to stand: 12 sec--difficulty with full upright posture/extension Mini-BESTest: 16/28 3 Meter Backwards Walk Test: NT 10 Meter Walk Test: 16 sec - 2.0 ft/sec  [facility] PT Assessment - 07/24/23 0001       Mini-BESTest   Sit To Stand Normal: Comes to stand without use of hands and stabilizes independently.    Rise to Toes Moderate: Heels up, but not full range (smaller than when holding hands), OR noticeable instability for 3 s.    Stand on one leg (left) Moderate: < 20 s    Stand on one leg (right) Moderate: < 20 s    Stand on one leg - lowest score 1    Compensatory Stepping Correction - Forward Normal: Recovers independently with a single, large step (second realignement is allowed).    Compensatory Stepping Correction - Backward  Moderate: More than one step is required to recover equilibrium    Compensatory Stepping Correction - Left Lateral Normal: Recovers independently with 1 step (crossover or lateral OK)    Compensatory Stepping Correction - Right Lateral Normal: Recovers independently with 1 step (crossover or lateral OK)    Stepping Corredtion Lateral - lowest score 2    Stance - Feet together, eyes open, firm surface  Normal: 30s    Stance - Feet together, eyes closed, foam surface  Normal: 30s    Incline - Eyes Closed Normal: Stands independently 30s and aligns with gravity     Change in Gait Speed Normal: Significantly changes walkling speed without imbalance    Walk with head turns - Horizontal Normal: performs head turns with no change in gait speed and good balance    Walk with pivot turns Moderate:Turns with feet close SLOW (>4 steps) with good balance.    Step over obstacles Moderate: Steps over box but touches box OR displays cautious behavior by slowing gait.    Timed UP & GO with Dual Task Moderate: Dual Task affects either counting OR walking (>10%) when compared to the TUG without Dual Task.   regular: 13 sec; cog: 17   Mini-BEST total score 22                                                                                                                                           TREATMENT DATE:     PATIENT EDUCATION: Education details: assessment details, rationale of PT intervention Person educated: Patient Education method: Explanation Education comprehension: verbalized understanding  HOME EXERCISE PROGRAM: TBD  GOALS: Goals reviewed with patient? Yes  SHORT TERM GOALS: Target date: 07/03/2023    Patient will be independent in HEP to improve functional outcomes Baseline: Goal status: MET    LONG TERM GOALS: Target date: 07/24/2023    Demo improved motor control and reduced risk for falls per score 24/28 Mini-BESTest Baseline: 16/28; 22/28 Goal status: IN PROGRESS (07/24/22)  2.  Perform 3 Meter Backwards Walk Test in 4.8 sec to reduce risk for falls Baseline: 8.4 sec; 5.4 sec Goal status: IN PROGRESS (07/24/23)  3.  Teach-back relevant strategies for exercise interventions as it pertains to PD Baseline:  Goal status: IN PROGRESS  4.  Maintain gait speed of 2.8 ft/sec to improve efficiency of community ambulation Baseline: 2.0 ft/sec; 2.2 ft/sec (no AD) 2.9 ft/sec (w/ AD) Goal status: IN PROGRESS (07/24/23)    ASSESSMENT:  CLINICAL IMPRESSION: Initiated with seated and standing large amplitude movements to improve  coordination and motor control.  POC details performed and review with improved score from initial 16 to 22/28 Mini-BESTest indicating lower risk for falls per age cohort and improved time/control for but still indicating increased risk for falls per time > 4.8 sec.  Gait speed improved from baseline 2.0 ft/sec to 2.9 ft/sec w/  8MV.  Education/discussion regarding best physical options for higher intensity cardiovascular exercise. Continued sessions to refine HEP and progress more dynamic balance and trials of treadmill training for carryover at home.   OBJECTIVE IMPAIRMENTS: Abnormal gait, decreased activity tolerance, decreased balance, decreased coordination, decreased mobility, difficulty walking, decreased strength, impaired tone, and postural dysfunction.   ACTIVITY LIMITATIONS: carrying, lifting, bending, squatting, transfers, and locomotion level  PARTICIPATION LIMITATIONS: community activity and exercise classes  PERSONAL FACTORS: Age, Time since onset of injury/illness/exacerbation, and 3+ comorbidities: PMH are also affecting patient's functional outcome.   REHAB POTENTIAL: Good  CLINICAL DECISION MAKING: Evolving/moderate complexity  EVALUATION COMPLEXITY: Moderate  PLAN:  PT FREQUENCY: 1-2x/week  PT DURATION: 4 weeks  PLANNED INTERVENTIONS: 97750- Physical Performance Testing, 97110-Therapeutic exercises, 97530- Therapeutic activity, [id number]- Neuromuscular re-education, 97535- Self Care, 78469- Manual therapy, [id number]- Gait training, [phone]- Canalith repositioning, and [id number]- Aquatic Therapy  PLAN FOR NEXT SESSION: Dynamic balance and treadmill training, sessions 1x/wk after Friday   12:48 PM, 07/24/23 M. Kelly Aquila Menzie, PT, DPT Physical Therapist- [place] Office Number: [phone]

## 2023-07-26 ENCOUNTER — Ambulatory Visit

## 2023-07-26 ENCOUNTER — Ambulatory Visit: Admitting: Physical Therapy

## 2023-07-26 DIAGNOSIS — H401421 Capsular glaucoma with pseudoexfoliation of lens, left eye, mild stage: Secondary | ICD-10-CM | POA: Diagnosis not present

## 2023-07-26 DIAGNOSIS — H401111 Primary open-angle glaucoma, right eye, mild stage: Secondary | ICD-10-CM | POA: Diagnosis not present

## 2023-07-26 DIAGNOSIS — R41841 Cognitive communication deficit: Secondary | ICD-10-CM

## 2023-07-26 DIAGNOSIS — R2681 Unsteadiness on feet: Secondary | ICD-10-CM

## 2023-07-26 DIAGNOSIS — Z961 Presence of intraocular lens: Secondary | ICD-10-CM | POA: Diagnosis not present

## 2023-07-26 DIAGNOSIS — M6281 Muscle weakness (generalized): Secondary | ICD-10-CM | POA: Diagnosis not present

## 2023-07-26 DIAGNOSIS — R2689 Other abnormalities of gait and mobility: Secondary | ICD-10-CM

## 2023-07-26 DIAGNOSIS — R471 Dysarthria and anarthria: Secondary | ICD-10-CM

## 2023-07-26 DIAGNOSIS — R262 Difficulty in walking, not elsewhere classified: Secondary | ICD-10-CM | POA: Diagnosis not present

## 2023-07-26 DIAGNOSIS — R1310 Dysphagia, unspecified: Secondary | ICD-10-CM | POA: Diagnosis not present

## 2023-07-26 DIAGNOSIS — H52203 Unspecified astigmatism, bilateral: Secondary | ICD-10-CM | POA: Diagnosis not present

## 2023-07-26 NOTE — Therapy (Signed)
 OUTPATIENT PHYSICAL THERAPY NEURO TREATMENT   Patient Name: Janet Wilson MRN: [id number] DOB:05-06-1934, 89 y.o., female Today's Date: 07/26/2023   PCP: Marguerite Shiley, MD REFERRING PROVIDER: Glory Larsen, MD    END OF SESSION:  PT End of Session - 07/26/23 1009     Visit Number 10    Number of Visits 14    Date for PT Re-Evaluation 08/28/23    Authorization Type [employer]    Progress Note Due on Visit 19    PT Start Time 1016    PT Stop Time 1058    PT Time Calculation (min) 42 min    Equipment Utilized During Treatment Gait belt    Activity Tolerance Patient tolerated treatment well    Behavior During Therapy WFL for tasks assessed/performed            Past Medical History:  Diagnosis Date   Aortic aneurysm (HCC)    Arthritis    OA BOTH KNEES AND HANDS   Atrial tachycardia (HCC)    HX OF   GERD (gastroesophageal reflux disease)    Glaucoma    left eye   Goiter    CAUSING COUGH, HOARSINESS AND DRY THROAT   Headache(784.0)    MIGRAINES    Hyperlipidemia    Neuralgia    Overactive bladder    RLS (restless legs syndrome)    Temporal arteritis (HCC)    Past Surgical History:  Procedure Laterality Date   ARTERY BIOPSY Right 07/21/2012   Procedure: BIOPSY TEMPORAL ARTERY;  Surgeon: Darcella Earnest, MD;  Location: [facility] OR;  Service: General;  Laterality: Right;   CERVICAL FUSION  2012   Dr. Nigel Bart   EYE SURGERY     CATARACT EXTRACTION- BILATERAL   INCONTINENCE SURGERY     JOINT REPLACEMENT     right knee   LEFT SHOULDER SURGERY     RIGHT KNEE ARTHROSCOPY  11/2009     ROBOTIC ASSISTED BILATERAL SALPINGO OOPHERECTOMY Bilateral 12/08/2013   Procedure: ROBOTIC ASSISTED LAPAROSCOPIC BILATERAL SALPINGO OOPHORECTOMY WITH STAGING;  Surgeon: Alphonso Aschoff, MD;  Location: [facility];  Service: Gynecology;  Laterality: Bilateral;   TOTAL HIP ARTHROPLASTY Left 04/03/2022   Procedure: TOTAL HIP ARTHROPLASTY ANTERIOR APPROACH;  Surgeon:  Saundra Curl, MD;  Location: [facility];  Service: Orthopedics;  Laterality: Left;   TOTAL KNEE ARTHROPLASTY  07/04/2011   Procedure: TOTAL KNEE ARTHROPLASTY;  Surgeon: Aurther Blue, MD;  Location: [facility];  Service: Orthopedics;  Laterality: Right;   Patient Active Problem List   Diagnosis Date Noted   Dysphagia 07/24/2022   Flatulence, eructation and gas pain 07/24/2022   Hip pain 07/24/2022   Left lower quadrant pain 07/24/2022   Aortic valve disease 04/04/2022   Closed left hip fracture (HCC) 04/02/2022   Aortic aneurysm (HCC) 04/02/2022   Dementia (HCC) 01/21/2022   Post-nasal drip 12/20/2021   Gastroesophageal reflux disease without esophagitis 12/20/2021   Depression with anxiety 12/20/2021   Cellulitis of right leg 12/20/2021   Primary insomnia 12/20/2021   Hyperlipidemia 12/20/2021   Conjunctivitis, chronic, follicular 03/06/2021   Osteoarthritis of right glenohumeral joint 04/04/2020   Coronary artery calcification 12/18/2019   Aortic atherosclerosis (HCC) 12/18/2019   Pure hypercholesterolemia 12/18/2019   Tear of lateral meniscus of knee 01/21/2019   Senile osteopenia 03/20/2018   H/O temporal arteritis 03/20/2018   Memory loss, short term 03/20/2018   Weight loss 03/20/2018   Nausea 03/20/2018   Mixed urge and stress incontinence 03/20/2018  Multinodular goiter (nontoxic) 03/20/2018   RLS (restless legs syndrome) 12/19/2017   Pain in right knee 10/09/2017   Ectopic atrial tachycardia (HCC) 10/08/2016   Cough 09/06/2015   Hoarseness of voice 09/06/2015   Rhinitis, chronic 09/06/2015   Bilateral occipital neuralgia 06/16/2015   Convergence insufficiency 08/09/2014   Neck pain 03/05/2013   Capsular glaucoma with pseudoexfoliation of lens, left eye, moderate stage 12/22/2012   Right temporal headache 06/30/2012   Family history of coronary artery disease 09/24/2011   S/P TKR (total knee replacement) 09/24/2011   Tachycardia 08/10/2011   OA (osteoarthritis)  of knee 07/04/2011    ONSET DATE: several months ago  REFERRING DIAG: G20.A1 (ICD-10-CM) - Parkinson disease, symptomatic (HCC) R26.9 (ICD-10-CM) - Gait disorder W19.Benny Braver (ICD-10-CM) - Fall, initial encounter  THERAPY DIAG:  Unsteadiness on feet  Other abnormalities of gait and mobility  Rationale for Evaluation and Treatment: Rehabilitation  SUBJECTIVE:                                                                                                                                                                                             SUBJECTIVE STATEMENT: Didn't sleep well last night.   Pt accompanied by: self  PERTINENT HISTORY: is going to assisted living at [facility]. She fell she tripped over a chandelier overnight. She was going to bed. No freezing episodes, shuffling a little bit more  PAIN:  Are you having pain? Reports 5/10 in R shoulder-will continue to monitor  PRECAUTIONS: Fall  RED FLAGS: None   WEIGHT BEARING RESTRICTIONS: No  FALLS: Has patient fallen in last 6 months? Yes. Number of falls broke her left hip in a fall x 1 year ago. One fall in past 6 moths  LIVING ENVIRONMENT: Lives with: lives alone Lives in: Other [facility] in ALF, assist for medication, and supervision for bathing Stairs:   Has following equipment at home: Single point cane and Environmental consultant - 4 wheeled  PLOF: Independent with community mobility with device and Needs assistance with homemaking  PATIENT GOALS:   OBJECTIVE:    TODAY'S TREATMENT: 07/26/2023 Activity Comments  NuStep, Level 3, 4 extremities x 8 minutes Level 3/Level 5, 30 sec bouts 2 min warm up, then 30 sec intervals of >80 SPM, for improved intensity Rates effort level as 5/10  Treadmill gait training, 0.8>1 mph, BUE support Cues for increased step length, heestrike; pt stays forward flexed with increased UE support  Over ground gait 4 x 50 ft,  then 8 x 50 ft Cues for counting steps, taking less steps (pt goes  from 30 steps in 30 ft to 22 steps)  Heel toe raises 2 x 10 3 hold  Balance work with four square step activity, added obstacle and Airex, with more difficulty, min guard throughout Used mirror for visual cue, decreased step length in posterior direction  Forward/back walking 20 ft x 3 reps                 HOME EXERCISE PROGRAM Last updated: 06/26/23 Access Code: [email or web address] URL: [email or web address] Date: 06/26/2023 Prepared by: [facility] - Outpatient  Rehab - [facility]  Exercises - Sit to Stand with Arms Crossed  - 1 x daily - 5 x weekly - 2 sets - 10 reps - Seated Hamstring Stretch  - 1 x daily - 5 x weekly - 2 sets - 30 sec hold  Standing PWR moves 10x each, 1-2x/day at counter top     PATIENT EDUCATION: Education details: Continue current HEP Person educated: Patient Education method: Explanation, Demonstration, Tactile cues, Verbal cues, and Handouts Education comprehension: verbalized understanding and returned demonstration    Note: Objective measures were completed at Evaluation unless otherwise noted.  DIAGNOSTIC FINDINGS:   COGNITION: Overall cognitive status: unable to perform 3-item recall after 1-2 min, requiring prompts 2/3 recall   SENSATION: WFL  COORDINATION: Difficulty with rapid alternating movement Heel to shin grossly impaired Finger to nose WNL    MUSCLE TONE: increased tone to RUE flexors and right hamstrings    POSTURE: rounded shoulders, forward head, and increased thoracic kyphosis  LOWER EXTREMITY ROM:     Active  Right Eval Left Eval  Hip flexion    Hip extension    Hip abduction    Hip adduction    Hip internal rotation    Hip external rotation    Knee flexion 120 120  Knee extension -5 0  Ankle dorsiflexion 15 15  Ankle plantarflexion    Ankle inversion    Ankle eversion     (Blank rows = not tested)  LOWER EXTREMITY MMT:    4/5 gross BLE  BED MOBILITY:  Not tested, reports modified  independence use bed rails  TRANSFERS: independent    CURB:  Findings: CGA  STAIRS: Not tested GAIT: Findings: Distance walked:   and Comments: narrow BOS, 5-6 steps for turning  FUNCTIONAL TESTS:  5 times sit to stand: 12 sec--difficulty with full upright posture/extension Mini-BESTest: 16/28 3 Meter Backwards Walk Test: NT 10 Meter Walk Test: 16 sec - 2.0 ft/sec                                                                                                                                  TREATMENT DATE:     PATIENT EDUCATION: Education details: assessment details, rationale of PT intervention Person educated: Patient Education method: Explanation Education comprehension: verbalized understanding  HOME EXERCISE PROGRAM: TBD  GOALS: Goals reviewed with patient? Yes  SHORT TERM GOALS: Target date: 07/03/2023    Patient will be independent in HEP to improve  functional outcomes Baseline: Goal status: MET    LONG TERM GOALS: Target date: 07/24/2023>7/16/205 (per updated cert)    Demo improved motor control and reduced risk for falls per score 24/28 Mini-BESTest Baseline: 16/28; 22/28 Goal status: IN PROGRESS (07/24/22)  2.  Perform 3 Meter Backwards Walk Test in 4.8 sec to reduce risk for falls Baseline: 8.4 sec; 5.4 sec Goal status: IN PROGRESS (07/24/23)  3.  Teach-back relevant strategies for exercise interventions as it pertains to PD Baseline:  Goal status: IN PROGRESS  4.  Maintain gait speed of 2.8 ft/sec to improve efficiency of community ambulation Baseline: 2.0 ft/sec; 2.2 ft/sec (no AD) 2.9 ft/sec (w/ AD) Goal status: IN PROGRESS (07/24/23)    ASSESSMENT:  CLINICAL IMPRESSION: Pt presents today with reports of not sleeping well last night and lower voice volume (to see speech after PT session). Skilled PT session focused on aerobic warm up plus gait and balance work.  With treadmill, pt is able to only go 0.8>1 mph with heavy UE support and  more forward lean than typical, even with cues for longer stride and more upright posture.  She responds better to gait over clinic surfaces with cues for taking fewer steps, which yielded increased step length, arm swing and better posture.  She has decreased step length with posterior balance aspect of four-square step test, but no overt LOB.  She will continue to benefit from skilled PT towards goals for improved functional mobility and decreased fall risk.   OBJECTIVE IMPAIRMENTS: Abnormal gait, decreased activity tolerance, decreased balance, decreased coordination, decreased mobility, difficulty walking, decreased strength, impaired tone, and postural dysfunction.   ACTIVITY LIMITATIONS: carrying, lifting, bending, squatting, transfers, and locomotion level  PARTICIPATION LIMITATIONS: community activity and exercise classes  PERSONAL FACTORS: Age, Time since onset of injury/illness/exacerbation, and 3+ comorbidities: PMH are also affecting patient's functional outcome.   REHAB POTENTIAL: Good  CLINICAL DECISION MAKING: Evolving/moderate complexity  EVALUATION COMPLEXITY: Moderate  PLAN:  PT FREQUENCY: 1-2x/week  PT DURATION: 4 weeks  PLANNED INTERVENTIONS: 97750- Physical Performance Testing, 97110-Therapeutic exercises, 97530- Therapeutic activity, 97112- Neuromuscular re-education, 97535- Self Care, 16109- Manual therapy, [phone]- Gait training, [phone]- Canalith repositioning, and [id number]- Aquatic Therapy  PLAN FOR NEXT SESSION: Dynamic balance and gait, sessions 1x/wk after Friday 6/13 (pt needs to schedule)   Dessie Flow, PT 07/26/23 11:48 AM Phone: [phone] Fax: [phone]  [facility] at [facility] [address] [place], [place] [address] Phone # [phone] Fax # [phone]

## 2023-07-26 NOTE — Therapy (Signed)
 OUTPATIENT SPEECH LANGUAGE PATHOLOGY P[facility]INSON'S TREATMENT   Patient Name: Janet Wilson MRN: [id number] DOB:1935-01-20, 89 y.o., female Today's Date: 07/26/2023  PCP: Joanell Mowers, MD REFERRING PROVIDER: Glory Larsen, MD  END OF SESSION:        Past Medical History:  Diagnosis Date   Aortic aneurysm (HCC)    Arthritis    OA BOTH KNEES AND HANDS   Atrial tachycardia (HCC)    HX OF   GERD (gastroesophageal reflux disease)    Glaucoma    left eye   Goiter    CAUSING COUGH, HOARSINESS AND DRY THROAT   Headache(784.0)    MIGRAINES    Hyperlipidemia    Neuralgia    Overactive bladder    RLS (restless legs syndrome)    Temporal arteritis (HCC)    Past Surgical History:  Procedure Laterality Date   ARTERY BIOPSY Right 07/21/2012   Procedure: BIOPSY TEMPORAL ARTERY;  Surgeon: Darcella Earnest, MD;  Location: [facility];  Service: General;  Laterality: Right;   CERVICAL FUSION  2012   Dr. Nigel Bart   EYE SURGERY     CATARACT EXTRACTION- BILATERAL   INCONTINENCE SURGERY     JOINT REPLACEMENT     right knee   LEFT SHOULDER SURGERY     RIGHT KNEE ARTHROSCOPY  11/2009     ROBOTIC ASSISTED BILATERAL SALPINGO OOPHERECTOMY Bilateral 12/08/2013   Procedure: ROBOTIC ASSISTED LAPAROSCOPIC BILATERAL SALPINGO OOPHORECTOMY WITH STAGING;  Surgeon: Alphonso Aschoff, MD;  Location: [facility];  Service: Gynecology;  Laterality: Bilateral;   TOTAL HIP ARTHROPLASTY Left 04/03/2022   Procedure: TOTAL HIP ARTHROPLASTY ANTERIOR APPROACH;  Surgeon: Saundra Curl, MD;  Location: [facility];  Service: Orthopedics;  Laterality: Left;   TOTAL KNEE ARTHROPLASTY  07/04/2011   Procedure: TOTAL KNEE ARTHROPLASTY;  Surgeon: Aurther Blue, MD;  Location: [facility];  Service: Orthopedics;  Laterality: Right;   Patient Active Problem List   Diagnosis Date Noted   Dysphagia 07/24/2022   Flatulence, eructation and gas pain 07/24/2022   Hip pain 07/24/2022   Left lower quadrant pain 07/24/2022    Aortic valve disease 04/04/2022   Closed left hip fracture (HCC) 04/02/2022   Aortic aneurysm (HCC) 04/02/2022   Dementia (HCC) 01/21/2022   Post-nasal drip 12/20/2021   Gastroesophageal reflux disease without esophagitis 12/20/2021   Depression with anxiety 12/20/2021   Cellulitis of right leg 12/20/2021   Primary insomnia 12/20/2021   Hyperlipidemia 12/20/2021   Conjunctivitis, chronic, follicular 03/06/2021   Osteoarthritis of right glenohumeral joint 04/04/2020   Coronary artery calcification 12/18/2019   Aortic atherosclerosis (HCC) 12/18/2019   Pure hypercholesterolemia 12/18/2019   Tear of lateral meniscus of knee 01/21/2019   Senile osteopenia 03/20/2018   H/O temporal arteritis 03/20/2018   Memory loss, short term 03/20/2018   Weight loss 03/20/2018   Nausea 03/20/2018   Mixed urge and stress incontinence 03/20/2018   Multinodular goiter (nontoxic) 03/20/2018   RLS (restless legs syndrome) 12/19/2017   Pain in right knee 10/09/2017   Ectopic atrial tachycardia (HCC) 10/08/2016   Cough 09/06/2015   Hoarseness of voice 09/06/2015   Rhinitis, chronic 09/06/2015   Bilateral occipital neuralgia 06/16/2015   Convergence insufficiency 08/09/2014   Neck pain 03/05/2013   Capsular glaucoma with pseudoexfoliation of lens, left eye, moderate stage 12/22/2012   Right temporal headache 06/30/2012   Family history of coronary artery disease 09/24/2011   S/P TKR (total knee replacement) 09/24/2011   Tachycardia 08/10/2011   OA (osteoarthritis) of knee 07/04/2011  ONSET DATE: About 4 years ago Script 05/13/23  REFERRING DIAG: R47.1 (ICD-10-CM) - Dysarthria R49.8 (ICD-10-CM) - Hypophonia G20.A1 (ICD-10-CM) - Parkinson disease, symptomatic (HCC)  THERAPY DIAG:  Dysarthria and anarthria  Cognitive communication deficit  Rationale for Evaluation and Treatment: Rehabilitation  SUBJECTIVE:   SUBJECTIVE STATEMENT: It helps that I'm conscious about (my speech). Pt  indicated last night she was forceful when talking with husband and he understood her more than if she were not more mindful of her speech production.  Pt accompanied by: self  PERTINENT HISTORY: See above for PMHx 05/13/2023: [name]) DAT scan positive for Parkinson's Disorder likely idiopathic Parkinson's disease. Her speech pattern is messy, having problems speaking. She was evaluated for speech at [facility]. She is involved in Boxing. Discussed exercise is the only thing known to slow down progression of parkinson's disease. She is doing that 2x a week. Her husband went to memory care and he is at rehab and then going to skilled care. Wife is going to assisted living at [facility]. She fell she tripped over a chandelier overnight. She was going to bed. No freezing episodes, shuffling a little bit more, Drinks out of a straw. She had an experience with a slider, she took a slice and she couldn;t swallow, got lodged at the back of her throat, has to drink out of a straw. She declines a swallow study, states she is being more careful and chews thoroughly. [facility] has a dermatologist, discussed PT at [facility].   PAIN:  Are you having pain? Yes: NPRS scale: 2/10 Pain location: rt shoulder Pain description: sore Aggravating factors: certain movement Relieving factors: not moving it  FALLS: Has patient fallen in last 6 months?  See PT evaluation for details   PATIENT GOALS: I'd like to talk louder.  OBJECTIVE:  Note: Objective measures were completed at Evaluation unless otherwise noted.  DIAGNOSTIC FINDINGS:  DaTSCAN  IMPRESSION: Bilateral reduced radiotracer activity within the striata. Greater loss of activity in the RIGHT striatum. Pattern is typical of Parkinsonian syndrome pathology.   Of note, DaTSCAN  is not diagnostic of Parkinsonian syndromes, which remains a clinical diagnosis. DaTscan  is an adjuvant test to aid in the clinical diagnosis of Parkinsonian syndromes.      PATIENT REPORTED OUTCOME MEASURES (PROM): Communication Effectiveness Survey: 15/32 with higher score indicating more effective communication and greater QOL.                                                                                                                            TREATMENT DATE:   07/26/23: SLP needs to practice with pt her suggested swallow precautions next session. Pt req'd consistent mod-max A for reciting her everyday sentences with incr'd loudness, faded to usual mod A for average 74dB. SLP questioning if pt is completing these sentences at home correctly due to requiring at least mod cues to demo incr'd volume. SLP needed to provide mod cues usually, faded to occasionally for average 69dB  in tasks requiring sentence responses When told this was her last scheduled session today she said she will talk about scheduling more sessions with family. She was encouraged to schedule x1/week for 4 weeks for ST.   07/24/23: SPEECH: Pt began everyday sentences with average 74dB and req'd mod cues from SLP to increase volume of these sentences. SLP wonders if pt is practicing at an adequately loud level for her practice, and if she is practicing BID. SLP engaged pt in short conversational segments of 30-60 seconds x6, with pt loudness average 70dB initially and fading in average loudness with subsequent trials requiring SLP min progressing to mod cues in order to maintain average at low-70s dB. SWALLOWING: Pt mentioned she had a scary episode with shrimp fettuccine last week where she had to regurgitate a piece of shrimp due to decr'd pharyngeal clearance and beginning of choking with a bite of shrimp and noodles. SLP strongly urged pt to have one type of food at one time, chew thoroughly, swallow with intent. Secondly have liquid or solid at one time, and not together. SLP will practice this with pt next session.  07/17/23: SPEECH: SLP spent approx 10 minutes shaping pt's loud /a/ after  HEY! (HEY! Ahhhhhhhh--) to encourage louder speech production with /a/. She initially req'd consistent max cues faded to occasional mod cues for what to say, and for loudness. In word responses using simple problem solving/reasoning stimuli (3 clues task) pt req'd usual mod cues initially, faded to occasional min A. Pt was told to perform everyday sentences at home BID so that SLP can cont to shape /a/ in sessions.    07/12/23: SPEECH: Pt was assessed using her everyday sentences, mod cues initially for volume and intent. Simple answers to wh questions req'd occasional mod cues for incr'd volume and purpose/intent. This carried over into pt's conversation on the way out of ST room today and on her phone call asking for a pick up.   SWALLOWING: BSE today Dentition: adequate natural dentition Vocal quality at baseline: harsh and low vocal intensity Patient directly observed with POs: Yes: regular, dysphagia 3 (soft), and thin liquids  Feeding: able to feed self Liquids provided by: cup - coffee, due to pt commenting her coughing occurs mostly with coffee Oral phase signs and symptoms: munching chewing pattern Pharyngeal phase signs and symptoms: multiple swallows and complaints of residue with peanut butter crackers, cleared with liquid wash   Comments: Pt coughed with 1/7 sips coffee. Slurping noted. SLP shared the concept of chewing with intent and pt naturally developed 75% rotary pattern. When cued to swallow with intent prior to 3 subsequent liquid swallows, she indicated better swallowing function (smoother) than her normal swallow. Pt req'd usual faded to occasional min cues to chew and swallow with intent spontaneously. SLP to cont to treat - goal added. MBS likely not necessary at this time.   07/10/23: Needs BSE and PROM next session. SLP and pt, given the time since evaluation and her S statement, had long discussion re: pt's need to talk almost like she feels like she is shouting.  SLP used digital recorder with her today and showed pt that when she speaks like this it is actually WNL volume. SLP and pt generated 9 everyday sentences that pt was instructed to shout BID.  06/12/23:  SLP introduced pt to role of SLP in her course of ST. Pt req'd usual  mod cues to shape her loud /a/, as well as usual mod-max cues to use  intentional, stronger speech in some diagnostic therapy for louder speech. SLP educated pt that she needs to feel like she is shouting for her to be WNL volume. SLP provided examples with auditory recordings of pt for this. She will need further instruction with this.   PATIENT EDUCATION: Education details: see treatment date for details.  Person educated: Patient Education method: Explanation, Demonstration, and Verbal cues Education comprehension: verbalized understanding, returned demonstration, verbal cues required, and needs further education  HOME EXERCISE PROGRAM: Eventually loud /a/, and everyday sentences  GOALS: Goals reviewed with patient? No   SHORT TERM GOALS: Target date:  08/02/23 (due to visits)   Produce /a/ with average 85 dB over three sessions Baseline: Goal status: INITIAL   2.  pt will generate average 69dB when responding with single sentences, x 3 sessions  Baseline:  Goal status: INITIAL   3.  pt will generate 2 minutes simple conversation with 69dB average in 3 sessions  Baseline:  Goal status: INITIAL  4. Pt will demo knowledge of suboptimal speech volume with nonverbal cue by repeating last utterance 70% of the time in 3 sessions   Baseline:  Goal status: INITIAL  5.  Pt will undergo bedside swallow evaluation in first 3 sessions Baseline:  Goal status: met  6  Pt will report using greater use of intent/purpose when swallowing than prior to ST, with rare min A Baseline:  Goal status: INITIAL     LONG TERM GOALS: Target date: 08/16/23   pt will produce average 69dB  in 7 minutes simple conversation in 3  sessions  Baseline:  Goal status: INITIAL   2.  pt will maintain upper 60s dB average in 10 minute simple conversation x 3 sessions  Baseline:  Goal status: INITIAL   3.  pt will report fewer requests to repeat herself than prior to ST in 3 sessions  Baseline:  Goal status: INITIAL   4. Pt will demo knowledge of suboptimal speech volume with nonverbal cue by repeating last utterance 80% of the time in 3 sessions   Baseline:  Goal status: INITIAL  5.  Pt will score higher/better on PROM in the last 2 weeks of therapy Baseline:  Goal status: INITIAL  6.  Pt will spontaneously use rotary pattern chewing 75% of the time in 3 sessions Baseline:  Goal status: INITIAL  7.  Pt will report using greater use of intent/purpose when swallowing with mod I than prior to ST Baseline:  Goal status: INITIAL  ASSESSMENT:  CLINICAL IMPRESSION: Patient is a 89 y.o. F who was seen today for treatment of speech due to hypophonic voice in light of Parkinson's disease with sx for approx 4 years, according to pt. See treatment date above for today's date for further details on today's session. During eval, SLP and pt agreed a more traditional approach may need to be taken focusing on louder speech instead of following a highly structured therapy framework such as SPEAKOUT. OT evaluation should be considered referral was sent by referring MD. Pt needs to schedule OT eval if she desires.   OBJECTIVE IMPAIRMENTS: Objective impairments include attention, memory, dysarthria, and dysphagia. These impairments are limiting patient from managing medications, managing appointments, managing finances, household responsibilities, ADLs/IADLs, effectively communicating at home and in community, and safety when swallowing.Factors affecting potential to achieve goals and functional outcome are ability to learn/carryover information, cooperation/participation level, and severity of impairments.. Patient will benefit from  skilled SLP services to address above impairments and improve overall  function.  REHAB POTENTIAL: Fair to Good due to above factors  PLAN:  SLP FREQUENCY: 2x/week  SLP DURATION: 8 weeks  PLANNED INTERVENTIONS: Aspiration precaution training, Pharyngeal strengthening exercises, Diet toleration management , Language facilitation, Environmental controls, Trials of upgraded texture/liquids, Cueing hierachy, Cognitive reorganization, Internal/external aids, Oral motor exercises, Functional tasks, Multimodal communication approach, SLP instruction and feedback, Compensatory strategies, Patient/family education, [phone] Treatment of speech (30 or 45 min) , and [id number] Treatment of swallowing function    Zuriel Roskos, CCC-SLP 07/26/2023, 11:57 AM

## 2023-08-28 DIAGNOSIS — L719 Rosacea, unspecified: Secondary | ICD-10-CM | POA: Diagnosis not present

## 2023-08-28 DIAGNOSIS — L814 Other melanin hyperpigmentation: Secondary | ICD-10-CM | POA: Diagnosis not present

## 2023-08-28 DIAGNOSIS — L821 Other seborrheic keratosis: Secondary | ICD-10-CM | POA: Diagnosis not present

## 2023-09-02 ENCOUNTER — Non-Acute Institutional Stay: Admitting: Adult Health

## 2023-09-02 ENCOUNTER — Encounter: Payer: Self-pay | Admitting: Adult Health

## 2023-09-02 VITALS — BP 126/80 | HR 88 | Temp 97.7°F | Ht 61.0 in | Wt 115.0 lb

## 2023-09-02 DIAGNOSIS — R1310 Dysphagia, unspecified: Secondary | ICD-10-CM | POA: Diagnosis not present

## 2023-09-02 DIAGNOSIS — F5101 Primary insomnia: Secondary | ICD-10-CM

## 2023-09-02 DIAGNOSIS — E2839 Other primary ovarian failure: Secondary | ICD-10-CM | POA: Diagnosis not present

## 2023-09-02 DIAGNOSIS — G20A1 Parkinson's disease without dyskinesia, without mention of fluctuations: Secondary | ICD-10-CM | POA: Insufficient documentation

## 2023-09-02 DIAGNOSIS — I7121 Aneurysm of the ascending aorta, without rupture: Secondary | ICD-10-CM | POA: Diagnosis not present

## 2023-09-02 DIAGNOSIS — R634 Abnormal weight loss: Secondary | ICD-10-CM | POA: Diagnosis not present

## 2023-09-02 DIAGNOSIS — E7800 Pure hypercholesterolemia, unspecified: Secondary | ICD-10-CM | POA: Diagnosis not present

## 2023-09-02 DIAGNOSIS — R600 Localized edema: Secondary | ICD-10-CM

## 2023-09-02 DIAGNOSIS — F03A0 Unspecified dementia, mild, without behavioral disturbance, psychotic disturbance, mood disturbance, and anxiety: Secondary | ICD-10-CM

## 2023-09-02 NOTE — Progress Notes (Signed)
 Location:  [facility]  POS: Clinic  Provider: Tawni America, ANP  Goals of Care:     06/12/2023    3:08 PM  Advanced Directives  Does Patient Have a Medical Advance Directive? Yes  Does patient want to make changes to medical advance directive? No - Patient declined     Chief Complaint  Patient presents with   Follow-up    3 month follow up. Patient has concerns about rash on face.     HPI:  89 y.o. female here for routine f/u  Resides in [place]  Reports some weight gain and edema Started on Remeron  for sleep issues and weight No sob or cp  Denies issues with appetite or sleep [facility]02/2023 in [facility]  PHQ-2 Total Score 0     Saw dermatology diagnosed with Rosacea, treating with metro gel an dCleocin.  Follows with ophthalmology for glaucoma.  Memory loss: difficulty with names. Walks with walker. On exelon .  MMSE 30/30 05/15/23 MOCA at neuro office 15 02/22/23 Dysphagia: on a regular diet. Reports implementing strategies due to getting food stuck. Has worked with Speech therapy  Treated for cellulitis due to right hand swelling in May doxycycline , uric acid normal. Symptoms resolved PD: on sinemet . Reports dysphonia. Occasional hand tremor, slow gait. Follows with neurology  Goes to boxing twice a week   HLD LDL 67 April 2025 ON crestor    Mammograms: aged out Dexa needed, hx of hip fracture  Vaccines UTD   Past Medical History:  Diagnosis Date   Aortic aneurysm (HCC)    Arthritis    OA BOTH KNEES AND HANDS   Atrial tachycardia (HCC)    HX OF   GERD (gastroesophageal reflux disease)    Glaucoma    left eye   Goiter    CAUSING COUGH, HOARSINESS AND DRY THROAT   Headache(784.0)    MIGRAINES    Hyperlipidemia    Neuralgia    Overactive bladder    RLS (restless legs syndrome)    Temporal arteritis (HCC)     Past Surgical History:  Procedure Laterality Date   ARTERY BIOPSY Right  07/21/2012   Procedure: BIOPSY TEMPORAL ARTERY;  Surgeon: Sherlean JINNY Laughter, MD;  Location: [facility];  Service: General;  Laterality: Right;   CERVICAL FUSION  2012   Dr. Unice   EYE SURGERY     CATARACT EXTRACTION- BILATERAL   INCONTINENCE SURGERY     JOINT REPLACEMENT     right knee   LEFT SHOULDER SURGERY     RIGHT KNEE ARTHROSCOPY  11/2009     ROBOTIC ASSISTED BILATERAL SALPINGO OOPHERECTOMY Bilateral 12/08/2013   Procedure: ROBOTIC ASSISTED LAPAROSCOPIC BILATERAL SALPINGO OOPHORECTOMY WITH STAGING;  Surgeon: Maurilio Ship, MD;  Location: [facility];  Service: Gynecology;  Laterality: Bilateral;   TOTAL HIP ARTHROPLASTY Left 04/03/2022   Procedure: TOTAL HIP ARTHROPLASTY ANTERIOR APPROACH;  Surgeon: Beverley Evalene BIRCH, MD;  Location: [facility];  Service: Orthopedics;  Laterality: Left;   TOTAL KNEE ARTHROPLASTY  07/04/2011   Procedure: TOTAL KNEE ARTHROPLASTY;  Surgeon: Dempsey LULLA Moan, MD;  Location: [facility];  Service: Orthopedics;  Laterality: Right;    Allergies  Allergen Reactions   Lisinopril  Other (See Comments) and Cough    Very low BP/very tired also   Mold Extract [Trichophyton] Other (See Comments)    Headaches and migraines   Molds & Smuts Other (See Comments)    Headaches and migraines   Sulfa  Antibiotics    Codeine Nausea Only   Oxycodone  Nausea Only    Outpatient Encounter Medications as of 09/02/2023  Medication Sig   brimonidine (ALPHAGAN) 0.2 % ophthalmic solution Place 1 drop into both eyes 2 (two) times daily.   carbidopa -levodopa  (SINEMET  IR) 25-100 MG tablet Take 0.5 tablets by mouth 2 (two) times daily. -Try to separate Sinemet  from food (especially protein-rich foods like meat, dairy, eggs) by about 30-60 mins - this will help the absorption of the medication. If you have some nausea with the medication, you can take it with some light food like crackers or ginger ale. Take in the morning and then late afternoon.   Cholecalciferol (VITAMIN D3) 50 MCG (2000 UT) capsule  Take 1 capsule (2,000 Units total) by mouth daily.   clindamycin (CLEOCIN T) 1 % lotion Apply topically daily.   METROCREAM 0.75 % cream Apply 1 Application topically.   mirtazapine  (REMERON ) 15 MG tablet TAKE 1 TABLET BY MOUTH EVERYDAY AT BEDTIME   polyethylene glycol (MIRALAX  / GLYCOLAX ) 17 g packet Take 17 g by mouth daily.   rivastigmine  (EXELON ) 3 MG capsule Take 1 capsule (3 mg total) by mouth 2 (two) times daily.   rosuvastatin  (CRESTOR ) 10 MG tablet TAKE 1 TABLET BY MOUTH EVERY DAY   No facility-administered encounter medications on file as of 09/02/2023.    Review of Systems:  Review of Systems  Constitutional:  Negative for activity change, appetite change, chills, diaphoresis, fatigue, fever and unexpected weight change.  HENT:  Negative for congestion.   Respiratory:  Negative for cough, shortness of breath and wheezing.   Cardiovascular:  Positive for leg swelling. Negative for chest pain and palpitations.  Gastrointestinal:  Negative for abdominal distention, abdominal pain, constipation and diarrhea.  Genitourinary:  Negative for difficulty urinating and dysuria.  Musculoskeletal:  Positive for gait problem (uses walker). Negative for arthralgias, back pain, joint swelling and myalgias.  Neurological:  Positive for tremors (occasional). Negative for dizziness, seizures, syncope, facial asymmetry, speech difficulty, weakness, light-headedness, numbness and headaches.       Dysphonia, slow movement  Psychiatric/Behavioral:  Negative for agitation, behavioral problems, confusion and sleep disturbance.        Short term memory loss    Health Maintenance  Topic Date Due   Medicare Annual Wellness (AWV)  10/01/2023   COVID-19 Vaccine (6 - Moderna risk 2024-25 season) 11/15/2023 (Originally 05/16/2023)   INFLUENZA VACCINE  09/13/2023   DTaP/Tdap/Td (2 - Tdap) 12/12/2027   Pneumococcal Vaccine: 50+ Years  Completed   DEXA SCAN  Completed   Zoster Vaccines- Shingrix  Completed    Hepatitis B Vaccines  Aged Out   HPV VACCINES  Aged Out   Meningococcal B Vaccine  Aged Out    Physical Exam: Vitals:   09/02/23 1403  BP: 126/80  Pulse: 88  Temp: 97.7 F (36.5 C)  SpO2: 92%  Weight: 115 lb (52.2 kg)  Height: 5' 1 (1.549 m)   Body mass index is 21.73 kg/m. Wt Readings from Last 3 Encounters:  09/02/23 115 lb (52.2 kg)  05/28/23 106 lb 12.8 oz (48.4 kg)  05/24/23 107 lb 6.4 oz (48.7 kg)    Physical Exam  Labs reviewed: Basic Metabolic Panel: Recent Labs    11/20/22 0000 05/30/23 0000  NA 139  139 142  K 4.3  4.3 4.3  CL 102  102 107  CO2 24*  24* 23*  BUN 25*  25* 26*  CREATININE 0.8  0.8 0.7  CALCIUM   9.5  9.5 8.9  TSH 1.20 0.65   Liver Function Tests: Recent Labs    11/20/22 0000 05/30/23 0000  AST 28  28 25   ALT 23  23 27   ALKPHOS 110  110  --   ALBUMIN 4.4  4.4 3.4*   No results for input(s): LIPASE, AMYLASE in the last 8760 hours. No results for input(s): AMMONIA in the last 8760 hours. CBC: Recent Labs    11/20/22 0000 05/30/23 0000 06/14/23 0000  WBC 4.8 4.0 4.2  HGB 13.5 12.4 12.4  HCT 40 37 37  PLT 191 153 142*   Lipid Panel: Recent Labs    11/20/22 0000 05/30/23 0000  CHOL 156 125  HDL 64 53  LDLCALC 81 67  TRIG 52 30*   Lab Results  Component Value Date   HGBA1C 5.4 05/21/2014    Procedures since last visit: No results found.  Assessment/Plan  Pure hypercholesterolemia On crestor  Repeat lipid panel prior to next apt  Weight loss Improved on remeron   Primary insomnia Reports no sleeping issues  On remeron    Dementia (HCC) On exelon  Appropriate for al level of care  Aortic aneurysm [facility]) Followed by Dr Lucas on CT scan  Last scan Oct 2024  Dysphagia Has seen speech therapy On regular diet and tolerating well.   Parkinson's disease (HCC) On Sinemet  Followed by neurology    Leg edema Mild Recommend elevation Exercise as tolerated If worsening consider compression  hose.    Labs/tests ordered:  * No order type specified *CBC BMP prior to next apt  Next appt:  3 months   Total time :  time greater than 50% of total time spent doing pt counseling and coordination of care

## 2023-09-02 NOTE — Assessment & Plan Note (Signed)
 Has seen speech therapy On regular diet and tolerating well.

## 2023-09-02 NOTE — Assessment & Plan Note (Signed)
Improved on  remeron.

## 2023-09-02 NOTE — Assessment & Plan Note (Signed)
 Followed by Dr Lucas on CT scan  Last scan Oct 2024

## 2023-09-02 NOTE — Patient Instructions (Addendum)
 Recommend bone density test, nurse to schedule  Update routine labs prior to next apt with Dr Charlanne Mow text generated by [facility].

## 2023-09-02 NOTE — Assessment & Plan Note (Signed)
 On Sinemet  Followed by neurology

## 2023-09-02 NOTE — Assessment & Plan Note (Signed)
 On exelon  Appropriate for al level of care

## 2023-09-02 NOTE — Assessment & Plan Note (Signed)
 Reports no sleeping issues  On remeron 

## 2023-09-02 NOTE — Assessment & Plan Note (Signed)
 On crestor  Repeat lipid panel prior to next apt

## 2023-09-03 ENCOUNTER — Telehealth: Payer: Self-pay | Admitting: Pharmacist

## 2023-09-03 DIAGNOSIS — E7800 Pure hypercholesterolemia, unspecified: Secondary | ICD-10-CM

## 2023-09-03 NOTE — Progress Notes (Signed)
   09/03/2023  Patient ID: Janet Wilson, female   DOB: 05-Jun-1934, 89 y.o.   MRN: [id number]  Pharmacy Quality Measure Review  This patient is appearing on a report for being at risk of failing the adherence measure for cholesterol (statin) medications this calendar year.   Medication: Rosuvastatin  10 mg 1 tablet daily Last fill date: 07/23/2023 for 30 day supply  Contacted pharmacy to facilitate refills. Spoke with a representative at the Pharmacy who said a new prescription is needed to continue to fill.    Plan: Message the Provider about refills.  Janet Wilson, PharmD, BCACP Clinical Pharmacist [phone]

## 2023-09-04 ENCOUNTER — Other Ambulatory Visit (HOSPITAL_BASED_OUTPATIENT_CLINIC_OR_DEPARTMENT_OTHER): Payer: Self-pay | Admitting: Internal Medicine

## 2023-09-04 DIAGNOSIS — E2839 Other primary ovarian failure: Secondary | ICD-10-CM

## 2023-09-12 DIAGNOSIS — R197 Diarrhea, unspecified: Secondary | ICD-10-CM | POA: Diagnosis not present

## 2023-09-12 DIAGNOSIS — R110 Nausea: Secondary | ICD-10-CM | POA: Diagnosis not present

## 2023-09-12 DIAGNOSIS — M6281 Muscle weakness (generalized): Secondary | ICD-10-CM | POA: Diagnosis not present

## 2023-10-25 ENCOUNTER — Other Ambulatory Visit: Payer: Self-pay | Admitting: Surgery

## 2023-10-25 DIAGNOSIS — I7121 Aneurysm of the ascending aorta, without rupture: Secondary | ICD-10-CM

## 2023-10-30 DIAGNOSIS — L719 Rosacea, unspecified: Secondary | ICD-10-CM | POA: Diagnosis not present

## 2023-11-12 ENCOUNTER — Ambulatory Visit: Admitting: Neurology

## 2023-11-14 ENCOUNTER — Encounter: Payer: Self-pay | Admitting: Pharmacist

## 2023-11-14 NOTE — Progress Notes (Unsigned)
   11/14/2023  Patient ID: Janet Wilson, female   DOB: 05-Nov-1934, 89 y.o.   MRN: [id number]

## 2023-11-15 ENCOUNTER — Encounter: Payer: Self-pay | Admitting: Pharmacist

## 2023-11-15 NOTE — Progress Notes (Signed)
   11/15/2023  Patient ID: Janet Wilson, female   DOB: [birth year], 89 y.o.   MRN: [id number]  Pharmacy Quality Measure Review  This patient is appearing on a report for being at risk of failing the adherence measure for cholesterol (statin) medications this calendar year.   Medication: Rosuvastatin    Last fill date: 10/17/2023 for 30 day supply  Patient lives in a facility and gets her medications filled [facility]. They fill cycle packs and then do monthly billing.  LDL 67 mg/dl   Cassius DOROTHA Brought, PharmD, [facility] Clinical Pharmacist [phone]

## 2023-11-28 DIAGNOSIS — I10 Essential (primary) hypertension: Secondary | ICD-10-CM | POA: Diagnosis not present

## 2023-11-28 LAB — COMPREHENSIVE METABOLIC PANEL WITH GFR
Calcium: 9.4 (ref 8.7–10.7)
eGFR: 84

## 2023-11-28 LAB — BASIC METABOLIC PANEL WITH GFR
BUN: 23 — AB (ref 4–21)
CO2: 25 — AB (ref 13–22)
Chloride: 105 (ref 99–108)
Creatinine: 0.7 (ref 0.5–1.1)
Glucose: 75
Potassium: 4.1 meq/L (ref 3.5–5.1)
Sodium: 142 (ref 137–147)

## 2023-11-28 LAB — CBC: RBC: 4.58 (ref 3.87–5.11)

## 2023-11-28 LAB — CBC AND DIFFERENTIAL
HCT: 42 (ref 36–46)
Hemoglobin: 13.9 (ref 12.0–16.0)
Platelets: 168 K/uL (ref 150–400)
WBC: 4.8

## 2023-12-03 ENCOUNTER — Non-Acute Institutional Stay: Admitting: Internal Medicine

## 2023-12-03 ENCOUNTER — Encounter: Payer: Self-pay | Admitting: Internal Medicine

## 2023-12-03 VITALS — BP 104/78 | HR 67 | Temp 97.6°F | Ht 61.0 in | Wt 114.4 lb

## 2023-12-03 DIAGNOSIS — E782 Mixed hyperlipidemia: Secondary | ICD-10-CM

## 2023-12-03 DIAGNOSIS — I7121 Aneurysm of the ascending aorta, without rupture: Secondary | ICD-10-CM | POA: Diagnosis not present

## 2023-12-03 DIAGNOSIS — G3184 Mild cognitive impairment, so stated: Secondary | ICD-10-CM | POA: Diagnosis not present

## 2023-12-03 DIAGNOSIS — F5101 Primary insomnia: Secondary | ICD-10-CM

## 2023-12-03 DIAGNOSIS — E7800 Pure hypercholesterolemia, unspecified: Secondary | ICD-10-CM

## 2023-12-03 DIAGNOSIS — G20A1 Parkinson's disease without dyskinesia, without mention of fluctuations: Secondary | ICD-10-CM

## 2023-12-03 DIAGNOSIS — K219 Gastro-esophageal reflux disease without esophagitis: Secondary | ICD-10-CM | POA: Diagnosis not present

## 2023-12-04 ENCOUNTER — Ambulatory Visit (HOSPITAL_COMMUNITY)
Admission: RE | Admit: 2023-12-04 | Discharge: 2023-12-04 | Disposition: A | Discharge status: Home / Self Care | Source: Ambulatory Visit | Attending: Cardiology | Admitting: Cardiology

## 2023-12-04 DIAGNOSIS — I7121 Aneurysm of the ascending aorta, without rupture: Secondary | ICD-10-CM | POA: Diagnosis not present

## 2023-12-04 DIAGNOSIS — I700 Atherosclerosis of aorta: Secondary | ICD-10-CM | POA: Diagnosis not present

## 2023-12-04 MED ORDER — IOHEXOL 350 MG/ML SOLN
75.0000 mL | Freq: Once | INTRAVENOUS | Status: AC | PRN
  Administered 2023-12-04: 75 mL via INTRAVENOUS

## 2023-12-04 NOTE — Progress Notes (Signed)
 Location:   [facility]   Place of Service:  Clinic (12)  Provider:   Code Status: DNR Goals of Care:     06/12/2023    3:08 PM  Advanced Directives  Does Patient Have a Medical Advance Directive? Yes  Does patient want to make changes to medical advance directive? No - Patient declined     Chief Complaint  Patient presents with   Follow-up    3 Month follow up    HPI: Patient is a 89 y.o. female seen today for medical management of chronic diseases.     AL in [place] New Diagnosis of Parkinson Sinemet  Very low dose  Did have Diarrhea initially but now resolved   MCI Recent Worsening Likely due to Parkinson But has adjusted to AL   Insomnia better on Remeron    Discussed the use of AI scribe software for clinical note transcription with the patient, who gave verbal consent to proceed.  History of Present Illness   Janet Wilson is an 89 year old female with Parkinson's disease who presents for a follow-up visit.   Janet Wilson wakes up three times a night to use the bathroom. Her mood is stable with no signs of depression, and Janet Wilson reports a fair quality of sleep.  Janet Wilson participates in boxing classes twice a week for individuals with Parkinson's disease. Her speech is challenging during these classes, but Janet Wilson is working on speaking loudly and clearly.   . Janet Wilson has gained weight, attributing this to regular meals   Janet Wilson has not received her flu or COVID-19 vaccinations,  Janet Wilson experiences knee pain, impairing her ability to walk independently. Janet Wilson has not had knee replacement surgery. Janet Wilson also reports swollen ankles, attributing this to a past incident of walking into a dishwasher door.  Janet Wilson is taking Sinemet  for Parkinson's disease, which helps her walk. Janet Wilson enjoys walking independently but is sometimes restricted by facility rules requiring accompaniment.  Janet Wilson has an upcoming appointment for her hearing aids and reports no dizziness or significant issues with her  balance.  Wt Readings from Last 3 Encounters:  12/03/23 114 lb 6.4 oz (51.9 kg)  09/02/23 115 lb (52.2 kg)  05/28/23 106 lb 12.8 oz (48.4 kg)         Past Medical History:  Diagnosis Date   Aortic aneurysm    Arthritis    OA BOTH KNEES AND HANDS   Atrial tachycardia    HX OF   GERD (gastroesophageal reflux disease)    Glaucoma    left eye   Goiter    CAUSING COUGH, HOARSINESS AND DRY THROAT   Headache(784.0)    MIGRAINES    Hyperlipidemia    Neuralgia    Overactive bladder    RLS (restless legs syndrome)    Temporal arteritis (HCC)     Past Surgical History:  Procedure Laterality Date   ARTERY BIOPSY Right 07/21/2012   Procedure: BIOPSY TEMPORAL ARTERY;  Surgeon: Sherlean JINNY Laughter, MD;  Location: [facility];  Service: General;  Laterality: Right;   CERVICAL FUSION  2012   Dr. Unice   EYE SURGERY     CATARACT EXTRACTION- BILATERAL   INCONTINENCE SURGERY     JOINT REPLACEMENT     right knee   LEFT SHOULDER SURGERY     RIGHT KNEE ARTHROSCOPY  11/2009     ROBOTIC ASSISTED BILATERAL SALPINGO OOPHERECTOMY Bilateral 12/08/2013   Procedure: ROBOTIC ASSISTED LAPAROSCOPIC BILATERAL SALPINGO OOPHORECTOMY WITH STAGING;  Surgeon: Maurilio Ship, MD;  Location: [facility];  Service: Gynecology;  Laterality: Bilateral;   TOTAL HIP ARTHROPLASTY Left 04/03/2022   Procedure: TOTAL HIP ARTHROPLASTY ANTERIOR APPROACH;  Surgeon: Beverley Evalene BIRCH, MD;  Location: [facility];  Service: Orthopedics;  Laterality: Left;   TOTAL KNEE ARTHROPLASTY  07/04/2011   Procedure: TOTAL KNEE ARTHROPLASTY;  Surgeon: Dempsey LULLA Moan, MD;  Location: [facility];  Service: Orthopedics;  Laterality: Right;    Allergies  Allergen Reactions   Lisinopril  Other (See Comments) and Cough    Very low BP/very tired also   Mold Extract [Trichophyton] Other (See Comments)    Headaches and migraines   Molds & Smuts Other (See Comments)    Headaches and migraines   Sulfa Antibiotics    Codeine Nausea Only   Oxycodone  Nausea Only     Outpatient Encounter Medications as of 12/03/2023  Medication Sig   brimonidine (ALPHAGAN) 0.2 % ophthalmic solution Place 1 drop into both eyes 2 (two) times daily.   carbidopa -levodopa  (SINEMET  IR) 25-100 MG tablet Take 0.5 tablets by mouth 2 (two) times daily. -Try to separate Sinemet  from food (especially protein-rich foods like meat, dairy, eggs) by about 30-60 mins - this will help the absorption of the medication. If you have some nausea with the medication, you can take it with some light food like crackers or ginger ale. Take in the morning and then late afternoon.   Cholecalciferol (VITAMIN D3) 50 MCG (2000 UT) capsule Take 1 capsule (2,000 Units total) by mouth daily.   mirtazapine  (REMERON ) 15 MG tablet TAKE 1 TABLET BY MOUTH EVERYDAY AT BEDTIME   polyethylene glycol (MIRALAX  / GLYCOLAX ) 17 g packet Take 17 g by mouth daily.   rivastigmine  (EXELON ) 3 MG capsule Take 1 capsule (3 mg total) by mouth 2 (two) times daily.   rosuvastatin  (CRESTOR ) 10 MG tablet TAKE 1 TABLET BY MOUTH EVERY DAY   clindamycin (CLEOCIN T) 1 % lotion Apply topically daily. (Patient not taking: Reported on 12/03/2023)   METROCREAM 0.75 % cream Apply 1 Application topically. (Patient not taking: Reported on 12/03/2023)   No facility-administered encounter medications on file as of 12/03/2023.    Review of Systems:  Review of Systems  Constitutional:  Negative for activity change and appetite change.  HENT:  Positive for voice change.   Respiratory:  Negative for cough and shortness of breath.   Cardiovascular:  Positive for leg swelling.  Gastrointestinal:  Negative for constipation.  Genitourinary: Negative.   Musculoskeletal:  Positive for gait problem. Negative for arthralgias and myalgias.  Skin: Negative.   Neurological:  Negative for dizziness and weakness.  Psychiatric/Behavioral:  Positive for confusion. Negative for dysphoric mood and sleep disturbance.     Health Maintenance  Topic Date  Due   Influenza Vaccine  09/13/2023   Medicare Annual Wellness (AWV)  10/01/2023   COVID-19 Vaccine (6 - 2025-26 season) 10/14/2023   DTaP/Tdap/Td (2 - Tdap) 12/12/2027   Pneumococcal Vaccine: 50+ Years  Completed   DEXA SCAN  Completed   Zoster Vaccines- Shingrix  Completed   Meningococcal B Vaccine  Aged Out    Physical Exam: Vitals:   12/03/23 1211  BP: 104/78  Pulse: 67  Temp: 97.6 F (36.4 C)  SpO2: 96%  Weight: 114 lb 6.4 oz (51.9 kg)  Height: 5' 1 (1.549 m)   Body mass index is 21.62 kg/m. Physical Exam Vitals reviewed.  Constitutional:      Appearance: Normal appearance.  HENT:     Head: Normocephalic.     Nose:  Nose normal.     Mouth/Throat:     Mouth: Mucous membranes are moist.     Pharynx: Oropharynx is clear.  Eyes:     Pupils: Pupils are equal, round, and reactive to light.  Cardiovascular:     Rate and Rhythm: Normal rate and regular rhythm.     Pulses: Normal pulses.     Heart sounds: Normal heart sounds. No murmur heard. Pulmonary:     Effort: Pulmonary effort is normal.     Breath sounds: Normal breath sounds.  Abdominal:     General: Abdomen is flat. Bowel sounds are normal.     Palpations: Abdomen is soft.  Musculoskeletal:        General: Swelling present.     Cervical back: Neck supple.     Comments: Chronic Venous changes  Skin:    General: Skin is warm.  Neurological:     General: No focal deficit present.     Mental Status: Janet Wilson is alert and oriented to person, place, and time.     Comments: No Tremor or Rigidity Voice is Low but better  Psychiatric:        Mood and Affect: Mood normal.        Thought Content: Thought content normal.     Labs reviewed: Basic Metabolic Panel: Recent Labs    05/30/23 0000 11/28/23 0000  NA 142 142  K 4.3 4.1  CL 107 105  CO2 23* 25*  BUN 26* 23*  CREATININE 0.7 0.7  CALCIUM  8.9 9.4  TSH 0.65  --    Liver Function Tests: Recent Labs    05/30/23 0000  AST 25  ALT 27  ALBUMIN 3.4*    No results for input(s): LIPASE, AMYLASE in the last 8760 hours. No results for input(s): AMMONIA in the last 8760 hours. CBC: Recent Labs    05/30/23 0000 06/14/23 0000 11/28/23 0000  WBC 4.0 4.2 4.8  HGB 12.4 12.4 13.9  HCT 37 37 42  PLT 153 142* 168   Lipid Panel: Recent Labs    05/30/23 0000  CHOL 125  HDL 53  LDLCALC 67  TRIG 30*   Lab Results  Component Value Date   HGBA1C 5.4 05/21/2014    Procedures since last visit: CT ANGIO CHEST AORTA W/CM & OR WO/CM Result Date: 12/04/2023 CLINICAL DATA:  Surveillance thoracic aortic aneurysm. EXAM: CT ANGIOGRAPHY CHEST WITH CONTRAST TECHNIQUE: Multidetector CT imaging of the chest was performed using the standard protocol during bolus administration of intravenous contrast. Multiplanar CT image reconstructions and MIPs were obtained to evaluate the vascular anatomy. RADIATION DOSE REDUCTION: This exam was performed according to the departmental dose-optimization program which includes automated exposure control, adjustment of the mA and/or kV according to patient size and/or use of iterative reconstruction technique. CONTRAST:  75mL OMNIPAQUE  IOHEXOL  350 MG/ML SOLN COMPARISON:  11/28/2022 FINDINGS: Cardiovascular: Moderate stable cardiomegaly. Stable tiny amount of pericardial fluid. Calcified plaque over the left main and 3 vessel coronary arteries. Again noted is patient's ascending thoracic aortic aneurysm measuring 6.3 cm in AP diameter (previously 6.1 cm). Aortic root measures 3.3 cm and sinotubular junction measures 2.2 cm. Common takeoff of the right brachiocephalic and left common carotid arteries. Aortic arch measures 3 cm just distal to the subclavian artery takeoff. Proximal descending thoracic aorta measures 3.1 cm in transverse diameter. Pulmonary arterial system is normal. Remaining vascular structures are unremarkable. Mediastinum/Nodes: No evidence of mediastinal or hilar adenopathy. Remaining mediastinal  structures are unremarkable. Stable enlarged multinodular  thyroid  compatible with goiter. Lungs/Pleura: Lungs are adequately inflated without acute airspace process or effusion. Minimal left basilar atelectatic change. Minimal linear atelectasis over the right base. Calcified granuloma over the medial right upper lobe. Airways are normal. Upper Abdomen: Visualized images over the upper abdomen unchanged. Musculoskeletal: Anterior fusion hardware unchanged over the cervical spine. Known partially visualized L1 compression fracture. Review of the MIP images confirms the above findings. IMPRESSION: 1. Known ascending thoracic aortic aneurysm measuring 6.3 cm in AP diameter (previously 6.1 cm). Cardiothoracic surgery consultation recommended due to increased risk of rupture for TAA ? 5.5 cm. This recommendation follows 2010 ACCF/AHA/AATS/ACR/ASA/SCA/SCAI/SIR/STS/SVM Guidelines for the Diagnosis and Management of Patients With Thoracic Aortic Disease. Circulation. 2010; 121: Z733-z630. 2. Stable moderate cardiomegaly with atherosclerotic coronary artery disease. 3. Stable enlarged multinodular thyroid  compatible with goiter. 4. Known partially visualized L1 compression fracture. 5. Aortic atherosclerosis. Aortic Atherosclerosis (ICD10-I70.0). Electronically Signed   By: Toribio Agreste M.D.   On: 12/04/2023 11:45    Assessment/Plan Assessment and Plan    Parkinson's disease  Attends boxing classes for Parkinson's, aiding speech and movement. Reports speech difficulty but participates actively. Walks independently with her walker - Continue boxing classes twice weekly. -Neurology  for Parkinson's management.  Knee pain Severe knee pain impairs walking. No knee replacement. Condition improving but needs support when walking.  Lower extremity edema Ankle swelling noted.  Declined compression stockings.  Aortic aneurysm, under surveillance  Hearing loss, uses hearing aids Uses hearing aids. Hearing check-up  scheduled next week.   MCI (mild cognitive impairment) Now in AL On [employer]   MMSE 30/30  Primary insomnia Doing well with Remeron    S/P total left hip arthroplasty Walks with her walker  Weight loss Continue to be stable on Remeron     Mixed hyperlipidemia Statin LDL 67 in 4/25  General Health Maintenance  - Order flu and COVID-19 vaccinations and ensure administration.         Labs/tests ordered:  * No order type specified * Next appt:  12/30/2023

## 2023-12-11 ENCOUNTER — Ambulatory Visit: Admitting: Surgery

## 2023-12-18 ENCOUNTER — Encounter: Payer: Self-pay | Admitting: Surgery

## 2023-12-18 ENCOUNTER — Ambulatory Visit: Attending: Surgery | Admitting: Surgery

## 2023-12-18 VITALS — BP 128/79 | HR 85 | Resp 18 | Ht 61.0 in | Wt 112.0 lb

## 2023-12-18 DIAGNOSIS — I7121 Aneurysm of the ascending aorta, without rupture: Secondary | ICD-10-CM | POA: Diagnosis not present

## 2023-12-18 NOTE — Progress Notes (Signed)
 [address] [place] [address]             [phone]    HPI:  The patient is an 89 year old woman with history of a stable 5.6 x 5.7 cm fusiform ascending aortic aneurysm extending from the sinotubular junction up to the mid aortic arch beyond the left common carotid artery.  I last saw her on 12/05/2022 and CTA of the chest showed it had increased in size to 6.1 cm.  We decided to continue following this without surgery due to her advanced age, mild neurocognitive dysfunction, and frailty. In February 2024 she fell and suffered a left hip fracture and underwent total left hip arthroplasty. She has had a slow recovery from that. She also has problems with her left knee and right shoulder which have been chronic.  Since I last saw her she was diagnosed with Parkinson's disease after she developed hoarseness.  She has not had tremor and is currently on medication for Parkinson's disease.  She denies any chest or back pain. She is here today with her daughter.  She has continued to walk but has some pain in her knees and notes that her left knee sometimes gives out.  She has been using a rolling walker when she is out and about.  Current Outpatient Medications  Medication Sig Dispense Refill   brimonidine (ALPHAGAN) 0.2 % ophthalmic solution Place 1 drop into both eyes 2 (two) times daily.     carbidopa -levodopa  (SINEMET  IR) 25-100 MG tablet Take 0.5 tablets by mouth 2 (two) times daily. -Try to separate Sinemet  from food (especially protein-rich foods like meat, dairy, eggs) by about 30-60 mins - this will help the absorption of the medication. If you have some nausea with the medication, you can take it with some light food like crackers or ginger ale. Take in the morning and then late afternoon. 60 tablet 2   Cholecalciferol (VITAMIN D3) 50 MCG (2000 UT) capsule Take 1 capsule (2,000 Units total) by mouth daily.     mirtazapine  (REMERON ) 15 MG tablet TAKE 1 TABLET BY  MOUTH EVERYDAY AT BEDTIME 90 tablet 1   polyethylene glycol (MIRALAX  / GLYCOLAX ) 17 g packet Take 17 g by mouth daily. 14 each 0   rivastigmine  (EXELON ) 3 MG capsule Take 1 capsule (3 mg total) by mouth 2 (two) times daily. 180 capsule 4   rosuvastatin  (CRESTOR ) 10 MG tablet TAKE 1 TABLET BY MOUTH EVERY DAY 90 tablet 3   No current facility-administered medications for this visit.     Physical Exam: BP 128/79   Pulse 85   Resp 18   Ht 5' 1 (1.549 m)   Wt 112 lb (50.8 kg)   LMP  (LMP Unknown)   SpO2 94%   BMI 21.16 kg/m  She looks good for 89 years old.  Her voice is hoarse. Cardiac exam shows a regular rate and rhythm with a 2/6 systolic and diastolic murmur along the left lower sternal border. Lungs are clear. There is no peripheral edema.  Diagnostic Tests:  CLINICAL DATA:  Surveillance thoracic aortic aneurysm.   EXAM: CT ANGIOGRAPHY CHEST WITH CONTRAST   TECHNIQUE: Multidetector CT imaging of the chest was performed using the standard protocol during bolus administration of intravenous contrast. Multiplanar CT image reconstructions and MIPs were obtained to evaluate the vascular anatomy.   RADIATION DOSE REDUCTION: This exam was performed according to the departmental dose-optimization program which includes automated exposure control,  adjustment of the mA and/or kV according to patient size and/or use of iterative reconstruction technique.   CONTRAST:  75mL OMNIPAQUE  IOHEXOL  350 MG/ML SOLN   COMPARISON:  11/28/2022   FINDINGS: Cardiovascular: Moderate stable cardiomegaly. Stable tiny amount of pericardial fluid. Calcified plaque over the left main and 3 vessel coronary arteries.   Again noted is patient's ascending thoracic aortic aneurysm measuring 6.3 cm in AP diameter (previously 6.1 cm). Aortic root measures 3.3 cm and sinotubular junction measures 2.2 cm.   Common takeoff of the right brachiocephalic and left common carotid arteries. Aortic arch  measures 3 cm just distal to the subclavian artery takeoff. Proximal descending thoracic aorta measures 3.1 cm in transverse diameter.   Pulmonary arterial system is normal. Remaining vascular structures are unremarkable.   Mediastinum/Nodes: No evidence of mediastinal or hilar adenopathy. Remaining mediastinal structures are unremarkable. Stable enlarged multinodular thyroid  compatible with goiter.   Lungs/Pleura: Lungs are adequately inflated without acute airspace process or effusion. Minimal left basilar atelectatic change. Minimal linear atelectasis over the right base. Calcified granuloma over the medial right upper lobe. Airways are normal.   Upper Abdomen: Visualized images over the upper abdomen unchanged.   Musculoskeletal: Anterior fusion hardware unchanged over the cervical spine. Known partially visualized L1 compression fracture.   Review of the MIP images confirms the above findings.   IMPRESSION: 1. Known ascending thoracic aortic aneurysm measuring 6.3 cm in AP diameter (previously 6.1 cm). Cardiothoracic surgery consultation recommended due to increased risk of rupture for TAA ? 5.5 cm. This recommendation follows 2010 ACCF/AHA/AATS/ACR/ASA/SCA/SCAI/SIR/STS/SVM Guidelines for the Diagnosis and Management of Patients With Thoracic Aortic Disease. Circulation. 2010; 121: Z733-z630. 2. Stable moderate cardiomegaly with atherosclerotic coronary artery disease. 3. Stable enlarged multinodular thyroid  compatible with goiter. 4. Known partially visualized L1 compression fracture. 5. Aortic atherosclerosis.   Aortic Atherosclerosis (ICD10-I70.0).     Electronically Signed   By: Toribio Agreste M.D.   On: 12/04/2023 11:45    Impression:  This 89 year old somewhat frail woman with Parkinson's disease and degenerative arthritis has an ascending aortic aneurysm that was measured at 6.3 cm on her current CTA.  This was measured at 6.1 cm 1 year ago.  I have  personally measured both studies and I think there has been minimal enlargement.  She has known moderate to severe aortic insufficiency.  She is not a candidate for surgical repair due to her advanced age and comorbidities.  She remains fairly active overall for her age.  I reviewed the CTA images with the patient and her daughter and  answered their questions.  I stressed the importance of continued good blood pressure control in preventing further enlargement and acute aortic dissection.  I do not think there is any reason to do further CT scans since that is not going to change the treatment. She and her daughter are in agreement.  She has a DNR order written and brought the papers with her today to our office visit.  Plan:  She will continue to follow-up with her PCP and Dr. Jeffrie.  I spent 15 minutes performing this established patient evaluation and > 50% of this time was spent face to face counseling and coordinating the care of this patient's aortic aneurysm and aortic insufficiency.   Dorise MARLA Fellers, MD [facility] [phone]

## 2023-12-26 ENCOUNTER — Ambulatory Visit (HOSPITAL_BASED_OUTPATIENT_CLINIC_OR_DEPARTMENT_OTHER): Admitting: Cardiology

## 2023-12-26 ENCOUNTER — Encounter (HOSPITAL_BASED_OUTPATIENT_CLINIC_OR_DEPARTMENT_OTHER): Payer: Self-pay | Admitting: Cardiology

## 2023-12-26 VITALS — BP 90/62 | HR 86 | Ht 61.0 in | Wt 112.5 lb

## 2023-12-26 DIAGNOSIS — I700 Atherosclerosis of aorta: Secondary | ICD-10-CM | POA: Diagnosis not present

## 2023-12-26 DIAGNOSIS — I2510 Atherosclerotic heart disease of native coronary artery without angina pectoris: Secondary | ICD-10-CM

## 2023-12-26 DIAGNOSIS — I7121 Aneurysm of the ascending aorta, without rupture: Secondary | ICD-10-CM | POA: Diagnosis not present

## 2023-12-26 NOTE — Patient Instructions (Signed)
 Medication Instructions:  No changaes *If you need a refill on your cardiac medications before your next appointment, please call your pharmacy*  Lab Work: none If you have labs (blood work) drawn today and your tests are completely normal, you will receive your results only by: MyChart Message (if you have MyChart) OR A paper copy in the mail If you have any lab test that is abnormal or we need to change your treatment, we will call you to review the results.  Testing/Procedures: none  Follow-Up: At [facility], you and your health needs are our priority.  As part of our continuing mission to provide you with exceptional heart care, our providers are all part of one team.  This team includes your primary Cardiologist (physician) and Advanced Practice Providers or APPs (Physician Assistants and Nurse Practitioners) who all work together to provide you with the care you need, when you need it.  Your next appointment:   12 month(s)  Provider:   Oneil Parchment, MD, Rosaline Bane, NP, or Reche Finder, NP

## 2023-12-26 NOTE — Progress Notes (Signed)
 Cardiology Office Note:  .   Date:  12/26/2023  ID:  Janet Wilson, DOB 04/16/1934, MRN [id number] PCP: Charlanne Fredia CROME, MD  [facility] Providers Cardiologist:  Oneil Parchment, MD     History of Present Illness: .   Janet Wilson is a 89 y.o. female Discussed the use of AI scribe   History of Present Illness Janet Wilson is an 89 year old female with an ascending thoracic aortic aneurysm who presents for follow-up. She has been seen by Dr. Lucas for her condition.  She has an ascending thoracic aortic aneurysm measuring 6.3 cm, which was previously 6.1 cm as of the CT scan on December 04, 2023.  Her echocardiogram from 2024 was reviewed, and she recalls being told her heart pump function was normal and that there was some aortic valve regurgitation.  She has Parkinson's disease and attends a gym behind [employer] on Tuesdays and Thursdays, which she enjoys and finds beneficial. She tries to stay active by walking and using her arms and legs.  She resides at [facility] and reports that things have been good there. She is a retired engineer, petroleum.     Studies Reviewed: SABRA   EKG Interpretation Date/Time:  Thursday December 26 2023 10:53:24 EST Ventricular Rate:  87 PR Interval:  178 QRS Duration:  140 QT Interval:  408 QTC Calculation: 490 R Axis:   -56  Text Interpretation: Normal sinus rhythm Left bundle branch block When compared with ECG of 03-Apr-2022 22:38, T wave inversion no longer evident in Inferior leads T wave inversion now evident in Lateral leads Confirmed by Parchment Oneil ([id number]) on 12/26/2023 10:55:20 AM    Results RADIOLOGY Thoracic aortic aneurysm CT: Ascending thoracic aortic aneurysm measuring 6.3 cm (12/04/2023)  DIAGNOSTIC Echocardiogram: Normal ventricular function; aortic valve regurgitation (2024) Risk Assessment/Calculations:            Physical Exam:   VS:  BP 90/62   Pulse 86   Ht 5' 1  (1.549 m)   Wt 112 lb 8 oz (51 kg)   LMP  (LMP Unknown)   SpO2 96%   BMI 21.26 kg/m    Wt Readings from Last 3 Encounters:  12/26/23 112 lb 8 oz (51 kg)  12/18/23 112 lb (50.8 kg)  12/03/23 114 lb 6.4 oz (51.9 kg)    GEN: Well nourished, well developed in no acute distress NECK: No JVD; No carotid bruits CARDIAC: RRR, no murmurs, no rubs, no gallops RESPIRATORY:  Clear to auscultation without rales, wheezing or rhonchi  ABDOMEN: Soft, non-tender, non-distended EXTREMITIES:  No edema; No deformity   ASSESSMENT AND PLAN: .    Assessment and Plan Assessment & Plan Ascending thoracic aortic aneurysm Measuring 6.3 cm, previously 6.1 cm on CT scan from October 22nd, 2024. Not a candidate for surgical repair due to advanced age and comorbidities. No further CT scans needed as it will not change treatment. Risk of aortic dissection acknowledged, but no current symptoms. DNR order in place. - Continue current management without further CT scans. - Advised against lifting more than 20 pounds to prevent strain on the aorta. - Encouraged continued exercise and physical activity as tolerated.  Aortic valve regurgitation Likely secondary to aortic dilation. No current symptoms such as dyspnea. Echocardiogram from 2024 shows normal cardiac function with some valve regurgitation. - Continue monitoring for symptoms of heart failure or worsening regurgitation. - Encouraged regular follow-up to assess cardiac function.  Dispo: 1 yr  Signed, Oneil Parchment, MD

## 2023-12-30 ENCOUNTER — Non-Acute Institutional Stay: Payer: Self-pay | Admitting: Adult Health

## 2023-12-30 ENCOUNTER — Encounter: Payer: Self-pay | Admitting: Adult Health

## 2023-12-30 VITALS — BP 100/82 | HR 93 | Temp 97.4°F | Ht 61.0 in | Wt 113.4 lb

## 2023-12-30 DIAGNOSIS — Z0000 Encounter for general adult medical examination without abnormal findings: Secondary | ICD-10-CM

## 2023-12-30 NOTE — Progress Notes (Addendum)
 Chief Complaint  Patient presents with   Medicare Wellness    AWV     Subjective:   Janet Wilson is a 89 y.o. female who presents for a Medicare Annual Wellness Visit.  Allergies (verified) Lisinopril , Mold extract [trichophyton], Molds & smuts, Sulfa antibiotics, Codeine, and Oxycodone    History: Past Medical History:  Diagnosis Date   Aortic aneurysm    Arthritis    OA BOTH KNEES AND HANDS   Atrial tachycardia    HX OF   GERD (gastroesophageal reflux disease)    Glaucoma    left eye   Goiter    CAUSING COUGH, HOARSINESS AND DRY THROAT   Headache(784.0)    MIGRAINES    Hyperlipidemia    Neuralgia    Overactive bladder    RLS (restless legs syndrome)    Temporal arteritis (HCC)    Past Surgical History:  Procedure Laterality Date   ARTERY BIOPSY Right 07/21/2012   Procedure: BIOPSY TEMPORAL ARTERY;  Surgeon: Sherlean JINNY Laughter, MD;  Location: [facility] OR;  Service: General;  Laterality: Right;   CERVICAL FUSION  2012   Dr. Unice   EYE SURGERY     CATARACT EXTRACTION- BILATERAL   INCONTINENCE SURGERY     JOINT REPLACEMENT     right knee   LEFT SHOULDER SURGERY     RIGHT KNEE ARTHROSCOPY  11/2009     ROBOTIC ASSISTED BILATERAL SALPINGO OOPHERECTOMY Bilateral 12/08/2013   Procedure: ROBOTIC ASSISTED LAPAROSCOPIC BILATERAL SALPINGO OOPHORECTOMY WITH STAGING;  Surgeon: Maurilio Ship, MD;  Location: [facility] ORS;  Service: Gynecology;  Laterality: Bilateral;   TOTAL HIP ARTHROPLASTY Left 04/03/2022   Procedure: TOTAL HIP ARTHROPLASTY ANTERIOR APPROACH;  Surgeon: Beverley Evalene BIRCH, MD;  Location: [facility];  Service: Orthopedics;  Laterality: Left;   TOTAL KNEE ARTHROPLASTY  07/04/2011   Procedure: TOTAL KNEE ARTHROPLASTY;  Surgeon: Dempsey LULLA Moan, MD;  Location: [facility];  Service: Orthopedics;  Laterality: Right;   Family History  Problem Relation Age of Onset   Heart attack Mother    Alzheimer's disease Father    Social History   Occupational History   Occupation:  retired    Associate Professor: RETIRED  Tobacco Use   Smoking status: Never   Smokeless tobacco: Never  Vaping Use   Vaping status: Never Used  Substance and Sexual Activity   Alcohol use: Not Currently    Alcohol/week: 1.0 standard drink of alcohol    Types: 1 Glasses of wine per week    Comment: SELDOM, very little   Drug use: No   Sexual activity: Yes   Tobacco Counseling Counseling given: Not Answered  SDOH Screenings   Food Insecurity: No Food Insecurity (04/02/2022)  Housing: Low Risk  (04/02/2022)  Transportation Needs: No Transportation Needs (04/02/2022)  Utilities: Not At Risk (04/02/2022)  Alcohol Screen: Low Risk  (10/01/2022)  Depression (PHQ2-9): Low Risk  (12/30/2023)  Tobacco Use: Low Risk  (12/30/2023)   See flowsheets for full screening details  Depression Screen PHQ 2 & 9 Depression Scale- Over the past 2 weeks, how often have you been bothered by any of the following problems? Little interest or pleasure in doing things: 0 Feeling down, depressed, or hopeless (PHQ Adolescent also includes...irritable): 0 PHQ-2 Total Score: 0     Goals Addressed             This Visit's Progress    Patient Stated       Would like to sleep 6-8 hrs a night Turn off screen  1 hr before bed Avoid caffeine and alcool       Visit info / Clinical Intake: Medicare Wellness Visit Type:: Initial Annual Wellness Visit Persons participating in visit:: patient Medicare Wellness Visit Mode:: In-person (required for WTM) Information given by:: patient Interpreter Needed?: No Pre-visit prep was completed: no AWV questionnaire completed by patient prior to visit?: no Living arrangements:: in retirement community Patient's Overall Health Status Rating: very good Typical amount of pain: none Does pain affect daily life?: no Are you currently prescribed opioids?: no  Dietary Habits and Nutritional Risks How many meals a day?: 3 Eats fruit and vegetables daily?: yes Most meals are  obtained by: having others provide food In the last 2 weeks, have you had any of the following?: none Diabetic:: no  Fall Screening Falls in the past year?: 0 Number of falls in past year: 0 Was there an injury with Fall?: 0 Fall Risk Category Calculator: 0 Patient Fall Risk Level: Low Fall Risk  Fall Risk Patient at Risk for Falls Due to: Impaired balance/gait Fall risk Follow up: Falls evaluation completed  Cognitive Assessment Difficulty concentrating, remembering, or making decisions? : no Will 6CIT or Mini Cog be Completed: yes What year is it?: 0 points What month is it?: 0 points Give patient an address phrase to remember (5 components): [address] [place] About what time is it?: 0 points Count backwards from 20 to 1: 0 points Say the months of the year in reverse: 4 points Repeat the address phrase from earlier: 2 points 6 CIT Score: 6 points  Advance Directives (For Healthcare) Does Patient Have a Medical Advance Directive?: Yes Does patient want to make changes to medical advance directive?: No - Patient declined Type of Advance Directive: Out of facility DNR (pink MOST or yellow form)        Objective:    Today's Vitals   12/30/23 1113  BP: 100/82  Pulse: 93  Temp: (!) 97.4 F (36.3 C)  SpO2: 97%  Weight: 113 lb 6.4 oz (51.4 kg)  Height: 5' 1 (1.549 m)   Body mass index is 21.43 kg/m.  Current Medications (verified) Outpatient Encounter Medications as of 12/30/2023  Medication Sig   brimonidine (ALPHAGAN) 0.2 % ophthalmic solution Place 1 drop into both eyes 2 (two) times daily.   carbidopa -levodopa  (SINEMET  IR) 25-100 MG tablet Take 0.5 tablets by mouth 2 (two) times daily. -Try to separate Sinemet  from food (especially protein-rich foods like meat, dairy, eggs) by about 30-60 mins - this will help the absorption of the medication. If you have some nausea with the medication, you can take it with some light food like crackers or ginger ale.  Take in the morning and then late afternoon.   Cholecalciferol (VITAMIN D3) 50 MCG (2000 UT) capsule Take 1 capsule (2,000 Units total) by mouth daily.   mirtazapine  (REMERON ) 15 MG tablet TAKE 1 TABLET BY MOUTH EVERYDAY AT BEDTIME   polyethylene glycol (MIRALAX  / GLYCOLAX ) 17 g packet Take 17 g by mouth daily.   rivastigmine  (EXELON ) 3 MG capsule Take 1 capsule (3 mg total) by mouth 2 (two) times daily.   rosuvastatin  (CRESTOR ) 10 MG tablet TAKE 1 TABLET BY MOUTH EVERY DAY   No facility-administered encounter medications on file as of 12/30/2023.   Hearing/Vision screen Hearing Screening - Comments:: Patient wear hearing aids Immunizations and Health Maintenance Health Maintenance  Topic Date Due   COVID-19 Vaccine (3 - Moderna risk series) 01/03/2024   Medicare Annual Wellness (  AWV)  12/29/2024   DTaP/Tdap/Td (2 - Tdap) 12/12/2027   Pneumococcal Vaccine: 50+ Years  Completed   Influenza Vaccine  Completed   DEXA SCAN  Completed   Zoster Vaccines- Shingrix  Completed   Meningococcal B Vaccine  Aged Out        Assessment/Plan:  This is a routine wellness examination for [place].  Patient Care Team: Charlanne Fredia CROME, MD as PCP - General (Internal Medicine) Jeffrie Oneil BROCKS, MD as PCP - Cardiology (Cardiology) Ines Onetha NOVAK, MD as Consulting Physician (Neurology) Brenna Adine CROME, DO as Consulting Physician (Pulmonary Disease) Leila Bound, OD (Optometry) Steffani Garnette RIGGERS as Physician Assistant (Physician Assistant)  I have personally reviewed and noted the following in the patient's chart:   Medical and social history Use of alcohol, tobacco or illicit drugs  Current medications and supplements including opioid prescriptions. Functional ability and status Nutritional status Physical activity Advanced directives List of other physicians Hospitalizations, surgeries, and ER visits in previous 12 months Vitals Screenings to include cognitive, depression, and  falls Referrals and appointments  No orders of the defined types were placed in this encounter.  In addition, I have reviewed and discussed with patient certain preventive protocols, quality metrics, and best practice recommendations. A written personalized care plan for preventive services as well as general preventive health recommendations were provided to patient.   Tawni America, NP   12/30/2023   Return in 1 year (on 12/29/2024).  After Visit Summary: (In Person-Printed) AVS printed and given to the patient  Nurse Notes: NA

## 2023-12-30 NOTE — Patient Instructions (Addendum)
 Janet Wilson,  Thank you for taking the time for your Medicare Wellness Visit. I appreciate your continued commitment to your health goals. Please review the care plan we discussed, and feel free to reach out if I can assist you further.  Please note that Annual Wellness Visits do not include a physical exam. Some assessments may be limited, especially if the visit was conducted virtually. If needed, we may recommend an in-person follow-up with your provider.  Ongoing Care Seeing your primary care provider every 3 to 6 months helps us  monitor your health and provide consistent, personalized care.   Referrals If a referral was made during today's visit and you haven't received any updates within two weeks, please contact the referred provider directly to check on the status.  Recommended Screenings:  Health Maintenance  Topic Date Due   COVID-19 Vaccine (3 - Moderna risk series) 01/03/2024   Medicare Annual Wellness Visit  12/29/2024   DTaP/Tdap/Td vaccine (2 - Tdap) 12/12/2027   Pneumococcal Vaccine for age over 22  Completed   Flu Shot  Completed   DEXA scan (bone density measurement)  Completed   Zoster (Shingles) Vaccine  Completed   Meningitis B Vaccine  Aged Out       12/30/2023    2:10 PM  Advanced Directives  Does Patient Have a Medical Advance Directive? Yes  Type of Advance Directive Out of facility DNR (pink MOST or yellow form)    Vision: Annual vision screenings are recommended for early detection of glaucoma, cataracts, and diabetic retinopathy. These exams can also reveal signs of chronic conditions such as diabetes and high blood pressure.  Dental: Annual dental screenings help detect early signs of oral cancer, gum disease, and other conditions linked to overall health, including heart disease and diabetes.  Please see the attached documents for additional preventive care recommendations.  .Janet Wilson , Thank you for taking time to come for your Medicare  Wellness Visit. I appreciate your ongoing commitment to your health goals. Please review the following plan we discussed and let me know if I can assist you in the future.   Screening recommendations/referrals: Colonoscopy aged out Mammogram aged out Bone Density Ordered March 2026 Recommended yearly ophthalmology/optometry visit for glaucoma screening and checkup Recommended yearly dental visit for hygiene and checkup  Vaccinations: Influenza vaccine- due annually in September/October Pneumococcal vaccine up to date Tdap vaccine up to date Shingles vaccine up to date    Advanced directives: reviewed   Conditions/risks identified: fall risk   Next appointment: 1 year    Preventive Care 64 Years and Older, Female Preventive care refers to lifestyle choices and visits with your health care provider that can promote health and wellness. What does preventive care include? A yearly physical exam. This is also called an annual well check. Dental exams once or twice a year. Routine eye exams. Ask your health care provider how often you should have your eyes checked. Personal lifestyle choices, including: Daily care of your teeth and gums. Regular physical activity. Eating a healthy diet. Avoiding tobacco and drug use. Limiting alcohol use. Practicing safe sex. Taking low-dose aspirin  every day. Taking vitamin and mineral supplements as recommended by your health care provider. What happens during an annual well check? The services and screenings done by your health care provider during your annual well check will depend on your age, overall health, lifestyle risk factors, and family history of disease. Counseling  Your health care provider may ask you questions about your: Alcohol  use. Tobacco use. Drug use. Emotional well-being. Home and relationship well-being. Sexual activity. Eating habits. History of falls. Memory and ability to understand (cognition). Work and work  astronomer. Reproductive health. Screening  You may have the following tests or measurements: Height, weight, and BMI. Blood pressure. Lipid and cholesterol levels. These may be checked every 5 years, or more frequently if you are over 9 years old. Skin check. Lung cancer screening. You may have this screening every year starting at age 85 if you have a 30-pack-year history of smoking and currently smoke or have quit within the past 15 years. Fecal occult blood test (FOBT) of the stool. You may have this test every year starting at age 84. Flexible sigmoidoscopy or colonoscopy. You may have a sigmoidoscopy every 5 years or a colonoscopy every 10 years starting at age 62. Hepatitis C blood test. Hepatitis B blood test. Sexually transmitted disease (STD) testing. Diabetes screening. This is done by checking your blood sugar (glucose) after you have not eaten for a while (fasting). You may have this done every 1-3 years. Bone density scan. This is done to screen for osteoporosis. You may have this done starting at age 12. Mammogram. This may be done every 1-2 years. Talk to your health care provider about how often you should have regular mammograms. Talk with your health care provider about your test results, treatment options, and if necessary, the need for more tests. Vaccines  Your health care provider may recommend certain vaccines, such as: Influenza vaccine. This is recommended every year. Tetanus, diphtheria, and acellular pertussis (Tdap, Td) vaccine. You may need a Td booster every 10 years. Zoster vaccine. You may need this after age 12. Pneumococcal 13-valent conjugate (PCV13) vaccine. One dose is recommended after age 12. Pneumococcal polysaccharide (PPSV23) vaccine. One dose is recommended after age 52. Talk to your health care provider about which screenings and vaccines you need and how often you need them. This information is not intended to replace advice given to you by  your health care provider. Make sure you discuss any questions you have with your health care provider. Document Released: 02/25/2015 Document Revised: 10/19/2015 Document Reviewed: 11/30/2014 Elsevier Interactive Patient Education  2017 [employer].  Fall Prevention in the Home Falls can cause injuries. They can happen to people of all ages. There are many things you can do to make your home safe and to help prevent falls. What can I do on the outside of my home? Regularly fix the edges of walkways and driveways and fix any cracks. Remove anything that might make you trip as you walk through a door, such as a raised step or threshold. Trim any bushes or trees on the path to your home. Use bright outdoor lighting. Clear any walking paths of anything that might make someone trip, such as rocks or tools. Regularly check to see if handrails are loose or broken. Make sure that both sides of any steps have handrails. Any raised decks and porches should have guardrails on the edges. Have any leaves, snow, or ice cleared regularly. Use sand or salt on walking paths during winter. Clean up any spills in your garage right away. This includes oil or grease spills. What can I do in the bathroom? Use night lights. Install grab bars by the toilet and in the tub and shower. Do not use towel bars as grab bars. Use non-skid mats or decals in the tub or shower. If you need to sit down in the shower,  use a plastic, non-slip stool. Keep the floor dry. Clean up any water  that spills on the floor as soon as it happens. Remove soap buildup in the tub or shower regularly. Attach bath mats securely with double-sided non-slip rug tape. Do not have throw rugs and other things on the floor that can make you trip. What can I do in the bedroom? Use night lights. Make sure that you have a light by your bed that is easy to reach. Do not use any sheets or blankets that are too big for your bed. They should not hang  down onto the floor. Have a firm chair that has side arms. You can use this for support while you get dressed. Do not have throw rugs and other things on the floor that can make you trip. What can I do in the kitchen? Clean up any spills right away. Avoid walking on wet floors. Keep items that you use a lot in easy-to-reach places. If you need to reach something above you, use a strong step stool that has a grab bar. Keep electrical cords out of the way. Do not use floor polish or wax that makes floors slippery. If you must use wax, use non-skid floor wax. Do not have throw rugs and other things on the floor that can make you trip. What can I do with my stairs? Do not leave any items on the stairs. Make sure that there are handrails on both sides of the stairs and use them. Fix handrails that are broken or loose. Make sure that handrails are as long as the stairways. Check any carpeting to make sure that it is firmly attached to the stairs. Fix any carpet that is loose or worn. Avoid having throw rugs at the top or bottom of the stairs. If you do have throw rugs, attach them to the floor with carpet tape. Make sure that you have a light switch at the top of the stairs and the bottom of the stairs. If you do not have them, ask someone to add them for you. What else can I do to help prevent falls? Wear shoes that: Do not have high heels. Have rubber bottoms. Are comfortable and fit you well. Are closed at the toe. Do not wear sandals. If you use a stepladder: Make sure that it is fully opened. Do not climb a closed stepladder. Make sure that both sides of the stepladder are locked into place. Ask someone to hold it for you, if possible. Clearly mark and make sure that you can see: Any grab bars or handrails. First and last steps. Where the edge of each step is. Use tools that help you move around (mobility aids) if they are needed. These  include: Canes. Walkers. Scooters. Crutches. Turn on the lights when you go into a dark area. Replace any light bulbs as soon as they burn out. Set up your furniture so you have a clear path. Avoid moving your furniture around. If any of your floors are uneven, fix them. If there are any pets around you, be aware of where they are. Review your medicines with your doctor. Some medicines can make you feel dizzy. This can increase your chance of falling. Ask your doctor what other things that you can do to help prevent falls. This information is not intended to replace advice given to you by your health care provider. Make sure you discuss any questions you have with your health care provider. Document Released: 11/25/2008 Document  Revised: 07/07/2015 Document Reviewed: 03/05/2014 Elsevier Interactive Patient Education  2017 [employer].

## 2024-01-31 ENCOUNTER — Encounter: Payer: Self-pay | Admitting: Adult Health

## 2024-01-31 ENCOUNTER — Non-Acute Institutional Stay: Payer: Self-pay | Admitting: Adult Health

## 2024-01-31 DIAGNOSIS — J40 Bronchitis, not specified as acute or chronic: Secondary | ICD-10-CM

## 2024-01-31 MED ORDER — ACETAMINOPHEN 325 MG PO TABS: 650.0000 mg | ORAL_TABLET | ORAL | Status: AC | PRN

## 2024-01-31 NOTE — Progress Notes (Signed)
 " Location:  Medical Illustrator of Service:  [place] (13) Provider:   Bari America, ANP [facility] [phone]   Charlanne Fredia CROME, MD  Patient Care Team: Charlanne Fredia CROME, MD as PCP - General (Internal Medicine) Jeffrie Oneil BROCKS, MD as PCP - Cardiology (Cardiology) Ines Onetha NOVAK, MD as Consulting Physician (Neurology) Brenna Adine CROME, DO as Consulting Physician (Pulmonary Disease) Leila Bound, OD (Optometry) Steffani Garnette RIGGERS as Physician Assistant (Physician Assistant)  Extended Emergency Contact Information Primary Emergency Contact: Brodie Olam [place], [place] [address] [place] Phone: [phone] Relation: Daughter  Code Status:  DNR Goals of care: Advanced Directive information    12/30/2023    2:10 PM  Advanced Directives  Does Patient Have a Medical Advance Directive? Yes  Type of Advance Directive Out of facility DNR (pink MOST or yellow form)     Chief Complaint  Patient presents with   Acute Visit    cough    HPI:  Pt is a 89 y.o. female seen today for an acute visit for cough  Hx of PD, resides in [place]  Felt sick on 12/15 with fatigue, headache, and cough Rapid covid and flu negative.  Symptoms continued for 3 days CXR 01/29/24 obtained which showed perihilar inflammation, possible bronchitis.  Prednisone  started 20 mg bid on 01/29/24 Reports that she is feeling better and symptoms are improved.  Cough is improving Appetite fair Normal 02 sats No fever No sob Denies coughing with meals or difficulty swallowing. Does reports sometimes she feels something in her throat.   BNP 168 CBC WBC 3.2 Hgb 12.4 Hct 38 Plt 133  CMP Na 137 K 3.4 Cl 104 C02 26 Glucose 155 BUN 22 ALT 16 AST 21  Past Medical History:  Diagnosis Date   Aortic aneurysm    Arthritis    OA BOTH KNEES AND HANDS   Atrial tachycardia    HX OF   GERD (gastroesophageal reflux disease)    Glaucoma    left eye    Goiter    CAUSING COUGH, HOARSINESS AND DRY THROAT   Headache(784.0)    MIGRAINES    Hyperlipidemia    Neuralgia    Overactive bladder    RLS (restless legs syndrome)    Temporal arteritis (HCC)    Past Surgical History:  Procedure Laterality Date   ARTERY BIOPSY Right 07/21/2012   Procedure: BIOPSY TEMPORAL ARTERY;  Surgeon: Sherlean JINNY Laughter, MD;  Location: [facility] OR;  Service: General;  Laterality: Right;   CERVICAL FUSION  2012   Dr. Unice   EYE SURGERY     CATARACT EXTRACTION- BILATERAL   INCONTINENCE SURGERY     JOINT REPLACEMENT     right knee   LEFT SHOULDER SURGERY     RIGHT KNEE ARTHROSCOPY  11/2009     ROBOTIC ASSISTED BILATERAL SALPINGO OOPHERECTOMY Bilateral 12/08/2013   Procedure: ROBOTIC ASSISTED LAPAROSCOPIC BILATERAL SALPINGO OOPHORECTOMY WITH STAGING;  Surgeon: Maurilio Ship, MD;  Location: [facility];  Service: Gynecology;  Laterality: Bilateral;   TOTAL HIP ARTHROPLASTY Left 04/03/2022   Procedure: TOTAL HIP ARTHROPLASTY ANTERIOR APPROACH;  Surgeon: Beverley Evalene BIRCH, MD;  Location: [facility];  Service: Orthopedics;  Laterality: Left;   TOTAL KNEE ARTHROPLASTY  07/04/2011   Procedure: TOTAL KNEE ARTHROPLASTY;  Surgeon: Dempsey LULLA Moan, MD;  Location: [facility];  Service: Orthopedics;  Laterality: Right;    Allergies[1]  Outpatient Encounter Medications as of  01/31/2024  Medication Sig   acetaminophen  (TYLENOL ) 325 MG tablet Take 2 tablets (650 mg total) by mouth every 4 (four) hours as needed.   brimonidine (ALPHAGAN) 0.2 % ophthalmic solution Place 1 drop into both eyes 2 (two) times daily.   carbidopa -levodopa  (SINEMET  IR) 25-100 MG tablet Take 0.5 tablets by mouth 2 (two) times daily. -Try to separate Sinemet  from food (especially protein-rich foods like meat, dairy, eggs) by about 30-60 mins - this will help the absorption of the medication. If you have some nausea with the medication, you can take it with some light food like crackers or ginger ale. Take in the morning and  then late afternoon.   Cholecalciferol (VITAMIN D3) 50 MCG (2000 UT) capsule Take 1 capsule (2,000 Units total) by mouth daily.   mirtazapine  (REMERON ) 15 MG tablet TAKE 1 TABLET BY MOUTH EVERYDAY AT BEDTIME   polyethylene glycol (MIRALAX  / GLYCOLAX ) 17 g packet Take 17 g by mouth daily.   rivastigmine  (EXELON ) 3 MG capsule Take 1 capsule (3 mg total) by mouth 2 (two) times daily.   rosuvastatin  (CRESTOR ) 10 MG tablet TAKE 1 TABLET BY MOUTH EVERY DAY   No facility-administered encounter medications on file as of 01/31/2024.    Review of Systems  Constitutional:  Positive for fatigue. Negative for activity change, appetite change, chills, diaphoresis and fever.  HENT:  Negative for congestion.   Respiratory:  Positive for cough. Negative for shortness of breath and wheezing.   Cardiovascular:  Positive for leg swelling. Negative for chest pain.  Gastrointestinal:  Negative for abdominal distention, abdominal pain, constipation, diarrhea, nausea and vomiting.  Genitourinary:  Negative for difficulty urinating, dysuria and urgency.  Musculoskeletal:  Positive for gait problem. Negative for back pain, myalgias and neck pain.  Skin:  Negative for rash.  Neurological:  Negative for dizziness and weakness.  Psychiatric/Behavioral:  Negative for confusion.     Immunization History  Administered Date(s) Administered   Fluad Trivalent(High Dose 65+) 11/15/2022   INFLUENZA, HIGH DOSE SEASONAL PF 11/12/2016, 10/18/2017, 10/10/2018   Influenza Split 11/13/2012   Influenza,inj,quad, With Preservative 10/10/2018   Influenza-Unspecified 11/11/2008, 10/31/2009, 11/07/2011, 11/11/2013, 11/13/2016, 11/12/2023   Moderna Covid-19 Fall Seasonal Vaccine 73yrs & older 11/15/2022   Moderna Covid-19 Vaccine Bivalent Booster 33yrs & up 11/22/2021   Moderna SARS-COV2 Booster Vaccination 12/06/2023   PFIZER Comirnaty(Gray Top)Covid-19 Tri-Sucrose Vaccine 11/22/2021   Pneumococcal Conjugate-13 06/24/2013    Pneumococcal Polysaccharide-23 09/03/2014   Respiratory Syncytial Virus Vaccine,Recomb Aduvanted(Arexvy) 11/22/2021   Td 12/11/2017   Zoster Recombinant(Shingrix) 08/10/2016, 11/19/2016   Zoster, Live 06/24/2013   Zoster, Unspecified 11/19/2016   Pertinent  Health Maintenance Due  Topic Date Due   Influenza Vaccine  Completed   Bone Density Scan  Completed      11/27/2022    1:25 PM 09/02/2023    2:10 PM 12/03/2023    3:11 PM 12/03/2023    3:20 PM 12/30/2023    2:10 PM  Fall Risk  Falls in the past year? 0 0 0  0  Was there an injury with Fall? 0  0  0   0   Fall Risk Category Calculator 0 0 0  0  Patient at Risk for Falls Due to No Fall Risks No Fall Risks  Impaired balance/gait;Impaired mobility Impaired balance/gait  Fall risk Follow up Falls evaluation completed Falls evaluation completed   Falls evaluation completed     Data saved with a previous flowsheet row definition   Functional Status Survey:  Vitals:   01/31/24 1242  BP: 124/74  Pulse: 66  Resp: 18  Temp: 97.7 F (36.5 C)  SpO2: 96%   There is no height or weight on file to calculate BMI. Physical Exam Constitutional:      Appearance: Normal appearance.  HENT:     Head: Normocephalic and atraumatic.  Cardiovascular:     Rate and Rhythm: Normal rate and regular rhythm.     Heart sounds: Murmur heard.  Pulmonary:     Breath sounds: Rhonchi (scattered) present.  Lymphadenopathy:     Cervical: Cervical adenopathy present.  Neurological:     Mental Status: She is alert. Mental status is at baseline.  Psychiatric:        Mood and Affect: Mood normal.     Labs reviewed: Recent Labs    05/30/23 0000 11/28/23 0000  NA 142 142  K 4.3 4.1  CL 107 105  CO2 23* 25*  BUN 26* 23*  CREATININE 0.7 0.7  CALCIUM  8.9 9.4   Recent Labs    05/30/23 0000  AST 25  ALT 27  ALBUMIN 3.4*   Recent Labs    05/30/23 0000 06/14/23 0000 11/28/23 0000  WBC 4.0 4.2 4.8  HGB 12.4 12.4 13.9  HCT 37 37  42  PLT 153 142* 168   Lab Results  Component Value Date   TSH 0.65 05/30/2023   Lab Results  Component Value Date   HGBA1C 5.4 05/21/2014   Lab Results  Component Value Date   CHOL 125 05/30/2023   HDL 53 05/30/2023   LDLCALC 67 05/30/2023   TRIG 30 (A) 05/30/2023   CHOLHDL 2.0 03/18/2020    Significant Diagnostic Results in last 30 days:  No results found.  Assessment/Plan  1. Bronchitis (Primary) Improving Continue prednisone  to complete 5 day course Can use prn robitussin and/or duoneb per facility protcol Monitor and report if worsening or not improving.   Total time :  time greater than 50% of total time spent doing pt counseling and coordination of care        [1]  Allergies Allergen Reactions   Lisinopril  Other (See Comments) and Cough    Very low BP/very tired also   Mold Extract [Trichophyton] Other (See Comments)    Headaches and migraines   Molds & Smuts Other (See Comments)    Headaches and migraines   Sulfa Antibiotics    Codeine Nausea Only   Oxycodone  Nausea Only   "

## 2024-02-18 ENCOUNTER — Ambulatory Visit: Admitting: Neurology

## 2024-02-19 ENCOUNTER — Ambulatory Visit: Admitting: Neurology

## 2024-02-19 ENCOUNTER — Encounter: Payer: Self-pay | Admitting: Neurology

## 2024-02-19 ENCOUNTER — Other Ambulatory Visit (HOSPITAL_COMMUNITY): Payer: Self-pay | Admitting: Neurology

## 2024-02-19 VITALS — BP 98/53 | HR 87 | Ht 63.0 in | Wt 108.0 lb

## 2024-02-19 DIAGNOSIS — R471 Dysarthria and anarthria: Secondary | ICD-10-CM | POA: Diagnosis not present

## 2024-02-19 DIAGNOSIS — R1310 Dysphagia, unspecified: Secondary | ICD-10-CM | POA: Diagnosis not present

## 2024-02-19 DIAGNOSIS — G20C Parkinsonism, unspecified: Secondary | ICD-10-CM | POA: Diagnosis not present

## 2024-02-19 DIAGNOSIS — R413 Other amnesia: Secondary | ICD-10-CM | POA: Diagnosis not present

## 2024-02-19 DIAGNOSIS — R059 Cough, unspecified: Secondary | ICD-10-CM

## 2024-02-19 DIAGNOSIS — R498 Other voice and resonance disorders: Secondary | ICD-10-CM | POA: Diagnosis not present

## 2024-02-19 NOTE — Patient Instructions (Signed)
 I will order a swallow study to investigate your difficulty swallowing and choking episodes.  This is an x-ray, typically done through speech pathology. Talk to your primary care about reducing the Remeron  or even coming off of it as you are very sleepy during the day and Remeron  can make you drowsy and make you more off balance and increase your risk for falls. Use your walker at all times. We will continue with your rivastigmine  at 3 mg twice daily and levodopa  low-dose half a pill twice daily.  I would be cautious with any medication increases because your blood pressure is low and it can get worse with Parkinson's medications and also with memory medications. Please increase your water  intake to about 6 cups of water  per day.  You may not always hydrate well enough. Follow-up in about 6 to 8 months with the nurse practitioner in this clinic.

## 2024-02-19 NOTE — Progress Notes (Signed)
 Subjective:    Patient ID: Janet Wilson is a 89 y.o. female.  HPI    Interim history:   [name] is an 89 year old female with an underlying medical history of aortic aneurysm, arthritis with status post right knee replacement, status post left total hip replacement, left shoulder surgery, atrial tachycardia, reflux disease, glaucoma, goiter, degenerative neck disease with status post cervical fusion in 2012, hyperlipidemia, overactive bladder, restless leg syndrome, history of temporal arteritis, status post arterial biopsy in 2014, who presents for follow-up consultation of her parkinsonism, associated with memory loss and chronic constipation.  The patient is accompanied by her daughter today.  This is our first visit.  She previously followed with Dr. Onetha Epp in this office and was last seen in March 2025, at which time she was advised to continue with rivastigmine  at the current dose and carbidopa -levodopa  low-dose.  She had tried donepezil  in the past and had side effects and she had tried an increased dose of rivastigmine  with side effects in the past..  I reviewed the office visit note and copied older notes in this note below for reference.   Today, 02/19/2024: She reports having had more tremors in the right hand, her voice is weak.  Her daughter supplements her history and endorses that it is hard to understand patient sometimes.  She has not fallen thankfully.  She uses a rolling walker.  She tries to participate in boxing classes.  She has had some soreness in her right wrist.  She had a boxing class yesterday.  She has not been sleeping well at night and is on Remeron  15 mg nightly but daughter reports that she is very sleepy during the day and tends to sleep a lot during the day.  She does not always hydrate well with water .  Daughter estimates that she may only drink half a cup of water  per day, patient reports drinking more.  She also drinks about 2 cups  of cranberry juice she reports and drinks coffee in the morning but does not finish 1 cup usually.    Of note, she has had a positive DaTscan  in the past, she has had a brain MRI as well, both scans in January 2025.  Results are as below.  She has had increase in phlegm.  Mirtazapine  was also initiated to increase her appetite as she was losing weight, per Olam.  She has had occasional phlegm when laying down.  She has had occasional choking when eating greasy food.  She has ongoing issues with intermittent constipation but takes MiraLAX  as needed.  Memory has also become worse per daughter.  The patient's allergies, current medications, family history, past medical history, past social history, past surgical history and problem list were reviewed and updated as appropriate.   Previously:  05/13/2023 (Dr. Epp): <<05/13/2023: DAT scan positive for Parkinson's Disorder likely idiopathic Parkinson's disease. Her speech pattern is messy, having problems speaking. She was evaluated for speech at [facility]. She is involved in Boxing. Discussed exercise is the only thing known to slow down progression of parkinson's disease. She is doing that 2x a week. Her husband went to memory care and he is at rehab and then going to skilled care. Wife is going to assisted living at [facility]. She fell she tripped over a chandelier overnight. She was going to bed. No freezing episodes, shuffling a little bit more, Drinks out of a straw. She had an experience with a slider, she took a slice  and she couldn;t swallow, got lodged at the back of her throat, has to drink out of a straw. She declines a swallow study, states she is being more careful and chews thoroughly. [facility] has a dermatologist, discussed PT at [facility].    03/07/2023: FINDINGS: There is decreased radiotracer activity within the LEFT putamen. Decreased radiotracer activity in the head of the LEFT caudate nucleus. Mild decreased radiotracer  activity in the RIGHT putamen.   IMPRESSION: Bilateral reduced radiotracer activity within the striata. Greater loss of activity in the RIGHT striatum. Pattern is typical of Parkinsonian syndrome pathology.   Of note, DaTSCAN  is not diagnostic of Parkinsonian syndromes, which remains a clinical diagnosis. DaTscan  is an adjuvant test to aid in the clinical diagnosis of Parkinsonian syndromes.   MRI brain 03/12/2023:      IMPRESSION:    MRI brain (with and without) demonstrating: - Moderate perisylvian atrophy and mild chronic small vessel ischemic disease. - No acute findings.   Patient complains of symptoms per HPI as well as the following symptoms: none . Pertinent negatives and positives per HPI. All others negative     02/22/2023 (AA): << Discussed moving to assisted living with her husband Janet Wilson to ENT and has raspy voice and ENT mentioned parkinson's disease' Losing weight , has depression her husband is very ill, she feels her social life has decreased, husband having hallucinations and agitation Extended visit with patient's daughter and patient's sister in law who are ver concerned for patient   Specific Symptoms:   Tremor: no tremors.   Voice: voice is softer Sleep: trouble staying asleep              Vivid Dreams:  Yes.               Acting out dreams:  No. Wet Pillows: having a hard time with secretions and wet pillos Postural symptoms:  no             Falls? she has fallen, she doesn't know why Bradykinesia symptoms: daughter and sister in law state shuffling and walking slow Loss of smell:  Yes.   Loss of taste:  Yes.   Urinary Incontinence:  Yes. Difficulty Swallowing:  she coughs a lot. She has stairs   Handwriting, micrographia   Trouble with ADL's:  Yes.   (able to do it but slower than in the past)             Trouble buttoning clothing: Yes.   Depression:  Yes.   Memory changes:  Yes.     Hallucinations:  No.             visual distortions:  No. N/V:  constipation Lightheaded:  just a little bit when standing             Syncope: no   Patient complains of symptoms per HPI as well as the following symptoms: occ headaches but last esr/crp normal, prior temporal arteritis . Pertinent negatives and positives per HPI. All others negative >>  12/16/2017 (AA): <<She is improved. And formal neurocognitive testing did not show progression, in fact improvement. Here with daughter who provides information. The last testing was improved, normal as far as cognition. At night with nsomnia she thinks of movie stars and movies. She is doing well on the neupro  patch forRLS. She gets cramps at night. Will try gabapentin  for RLS and also for her cramping. She feels has difficulty with expression, she stammers, recommended speech therapy. >>  07/2017 (AA): << Significant decline, agitation and frustration, changing personality, memory loss. She is having difficulty with words, she forgets words, she can't get it out, she sometimes has to describe the item she is thinking about. They are going swimming, loves [facility]. They do water  aerobics.  Husband has had surgery andis getting over it. She is more active than they have been in the past. Husband is her armed forces logistics/support/administrative officer. Having trouble with words, she is trying to jot down words in a book and study them. She loses things or forget them somewhere. She may place her wallet in the garage and then forget it is there and she finds it. She left her phone in the garden. Short term is worsening. She doesn't remember appointments. She forgot about her blood tests. Still cooking and backing but very slow with directions, takes long to do complicated tasks she has to think more. Still driving. No accidents. She forgot how to put gas in the car, her husband had to show her. >>   12/11/2016 (AA): <<Patient returns today with her daughter and husband to discuss neurocognitive testing which revealed mild cognitive  impairment. I discussed that given her family history she is more at risk to progress to Alzheimer's disease. A portion of people with mild cognitive impairment do proceed to dementia. At this point I recommend trying to increase [employer] . Discussed clinical trials and patient is not interested. At a long discussion about mild cognitive impairment, things that we can do to further delineate such as FDG PET scans, other specialized scanning, research opportunities. Husband and daughter do feel as though she is more irritable and her short-term memory is worsening.>>   05/07/2016 (AA): << CLARENCE DUNSMORE is a 89 y.o. female here as a referral from Dr. Delice for memory problems. She has a past medical history of arthritis, glaucoma, temporal arteritis. She is here with daughter and husband. Started on Aricept  at last appointment. She returns with worsening memory complaints. Father had Alzheimers in his 75s.  She has been taking Aricept  5 mg for 6 months, half of the 10 mg tab as the 10 mg tablet caused side effects. Patient's memory continues to decline. Had a discussion with husband and daughter, she is becoming more disoriented and in familiar places, she is losing things more, she is forgetting more appointments, dates and conversations.  Unfortunately patient denies any changes in his quite agitated with her family recently. Today I had to be very gentle when suggesting formal neurocognitive testing, I did tell her that this is something we should do especially in people with family history inordinate to make her upset. Family really wants her to have it done for baseline and to evaluate her current cognition which I think is a good idea. Also discussed with him our clinical trials [facility] today which I think she would be wonderful for if she agrees to be part of it.   >>  Her Past Medical History Is Significant For: Past Medical History:  Diagnosis Date   Aortic aneurysm    Arthritis    OA BOTH  KNEES AND HANDS   Atrial tachycardia    HX OF   GERD (gastroesophageal reflux disease)    Glaucoma    left eye   Goiter    CAUSING COUGH, HOARSINESS AND DRY THROAT   Headache(784.0)    MIGRAINES    Hyperlipidemia    Neuralgia    Overactive bladder    RLS (restless legs syndrome)  Temporal arteritis (HCC)     Her Past Surgical History Is Significant For: Past Surgical History:  Procedure Laterality Date   ARTERY BIOPSY Right 07/21/2012   Procedure: BIOPSY TEMPORAL ARTERY;  Surgeon: Sherlean JINNY Laughter, MD;  Location: [facility] OR;  Service: General;  Laterality: Right;   CERVICAL FUSION  2012   Dr. Unice   EYE SURGERY     CATARACT EXTRACTION- BILATERAL   INCONTINENCE SURGERY     JOINT REPLACEMENT     right knee   LEFT SHOULDER SURGERY     RIGHT KNEE ARTHROSCOPY  11/2009     ROBOTIC ASSISTED BILATERAL SALPINGO OOPHERECTOMY Bilateral 12/08/2013   Procedure: ROBOTIC ASSISTED LAPAROSCOPIC BILATERAL SALPINGO OOPHORECTOMY WITH STAGING;  Surgeon: Maurilio Ship, MD;  Location: [facility] ORS;  Service: Gynecology;  Laterality: Bilateral;   TOTAL HIP ARTHROPLASTY Left 04/03/2022   Procedure: TOTAL HIP ARTHROPLASTY ANTERIOR APPROACH;  Surgeon: Beverley Evalene BIRCH, MD;  Location: [facility] OR[facility];  Service: Orthopedics;  Laterality: Left;   TOTAL KNEE ARTHROPLASTY  07/04/2011   Procedure: TOTAL KNEE ARTHROPLASTY;  Surgeon: Dempsey LULLA Moan, MD;  Location: [facility];  Service: Orthopedics;  Laterality: Right;    Her Family History Is Significant For: Family History  Problem Relation Age of Onset   Heart attack Mother    Alzheimer's disease Father     Her Social History Is Significant For: Social History   Socioeconomic History   Marital status: Married    Spouse name: Janet   Number of children: 2   Years of education: college   Highest education level: Not on file  Occupational History   Occupation: retired    Associate Professor: RETIRED  Tobacco Use   Smoking status: Never   Smokeless tobacco: Never  Vaping Use    Vaping status: Never Used  Substance and Sexual Activity   Alcohol use: Not Currently    Alcohol/week: 1.0 standard drink of alcohol    Types: 1 Glasses of wine per week    Comment: SELDOM, very little   Drug use: No   Sexual activity: Yes  Other Topics Concern   Not on file  Social History Narrative   Pt lives at home with husband.   Caffeine Use: Very little.    Patient is right handed       Social History      Diet? No       Do you drink/eat things with caffeine? Some coffee      Marital status?    Married                                What year were you married? 1961      Do you live in a house, apartment, assisted living, condo, trailer, etc.?  [place]       Is it one or more stories? one      How many persons live in your home? two      Do you have any pets in your home? No       Highest level of education completed? 14 years       Current or past profession:  Engineer, Petroleum       Do you exercise?              Yes                        Type & how often?  Water  aerobic and balance class      Advanced Directives      Do you have a living will?  yes      Do you have a DNR form?        yes                          If not, do you want to discuss one?      Do you have signed POA/HPOA for forms? yes      Functional Status      Do you have difficulty bathing or dressing yourself?      Do you have difficulty preparing food or eating?       Do you have difficulty managing your medications?      Do you have difficulty managing your finances?      Do you have difficulty affording your medications?   Social Drivers of Health   Tobacco Use: Low Risk (02/19/2024)   Patient History    Smoking Tobacco Use: Never    Smokeless Tobacco Use: Never    Passive Exposure: Not on file  Financial Resource Strain: Not on file  Food Insecurity: No Food Insecurity (04/02/2022)   Hunger Vital Sign    Worried About Running Out of Food in the Last Year: Never true    Ran Out  of Food in the Last Year: Never true  Transportation Needs: No Transportation Needs (04/02/2022)   PRAPARE - Administrator, Civil Service (Medical): No    Lack of Transportation (Non-Medical): No  Physical Activity: Not on file  Stress: Not on file  Social Connections: Not on file  Depression (PHQ2-9): Low Risk (12/30/2023)   Depression (PHQ2-9)    PHQ-2 Score: 0  Alcohol Screen: Low Risk (10/01/2022)   Alcohol Screen    Last Alcohol Screening Score (AUDIT): 1  Housing: Low Risk (04/02/2022)   Housing    Last Housing Risk Score: 0  Utilities: Not At Risk (04/02/2022)   AHC Utilities    Threatened with loss of utilities: No  Health Literacy: Not on file    Her Allergies Are:  Allergies[1]:   Her Current Medications Are:  Outpatient Encounter Medications as of 02/19/2024  Medication Sig   acetaminophen  (TYLENOL ) 325 MG tablet Take 2 tablets (650 mg total) by mouth every 4 (four) hours as needed.   brimonidine (ALPHAGAN) 0.2 % ophthalmic solution Place 1 drop into both eyes 2 (two) times daily.   carbidopa -levodopa  (SINEMET  IR) 25-100 MG tablet Take 0.5 tablets by mouth 2 (two) times daily. -Try to separate Sinemet  from food (especially protein-rich foods like meat, dairy, eggs) by about 30-60 mins - this will help the absorption of the medication. If you have some nausea with the medication, you can take it with some light food like crackers or ginger ale. Take in the morning and then late afternoon.   Cholecalciferol (VITAMIN D3) 50 MCG (2000 UT) capsule Take 1 capsule (2,000 Units total) by mouth daily.   mirtazapine  (REMERON ) 15 MG tablet TAKE 1 TABLET BY MOUTH EVERYDAY AT BEDTIME   polyethylene glycol (MIRALAX  / GLYCOLAX ) 17 g packet Take 17 g by mouth daily.   rivastigmine  (EXELON ) 3 MG capsule Take 1 capsule (3 mg total) by mouth 2 (two) times daily.   rosuvastatin  (CRESTOR ) 10 MG tablet TAKE 1 TABLET BY MOUTH EVERY DAY   No facility-administered encounter medications  on file as of 02/19/2024.  :  Review of Systems:  Out of a complete 14 point review of systems, all are reviewed and negative with the exception of these symptoms as listed below:  Review of Systems  Objective:  Neurological Exam  Physical Exam Physical Examination:   Vitals:   02/19/24 1050  BP: (!) 98/53  Pulse: 87    General Examination: The patient is an 89 year old female in no acute distress.  Well-groomed, appears frail.   HEENT: Normocephalic, atraumatic, pupils are equal, round and reactive to light, extraocular tracking is impaired, hearing mildly impaired, bilateral hearing aids in place.  She has corrective eyeglasses in place.  Face is symmetric with mild facial masking, mild nuchal rigidity noted.  No carotid bruits.  Airway examination reveals mild mouth dryness, tongue protrudes centrally and palate elevates symmetrically.  Moderate to severe hypophonia, mild dysarthria noted.  Chest: Clear to auscultation without wheezing, rhonchi or crackles noted.  Heart: S1+S2+0, regular and normal without murmurs, rubs or gallops noted.   Abdomen: Soft, non-tender and non-distended.  Extremities: There is no pitting edema in the distal lower extremities bilaterally.   Skin: Warm and dry without trophic changes noted.   Musculoskeletal: exam reveals no obvious joint deformities.   Neurologically:  Mental status: The patient is awake, alert and oriented in all 4 spheres. Her immediate and remote memory, attention, language skills and fund of knowledge are impaired.  She is not giving many details to her history but is able to answer simple questions appropriately.      02/19/2024   10:53 AM 02/22/2023   10:38 AM 10/01/2022    1:11 PM 08/02/2015   12:57 PM  MMSE - Mini Mental State Exam  Orientation to time 4 4 5 5    Orientation to Place 3 5 5 5    Registration 3 3 3 3    Attention/ Calculation 0 0 5 5   Recall 3 3 3 3    Language- name 2 objects 2 2 2 2    Language- repeat 1  1 1 1   Language- follow 3 step command 3 3 3 3    Language- read & follow direction 1 1 1 1    Write a sentence 1 1 1 1    Copy design 1 1 1 1    Total score 22 24 30 30       Data saved with a previous flowsheet row definition   On 02/19/2024: CDT: 3.5/4, AFT: 10/min.  Cranial nerves II - XII are as described above under HEENT exam.  Motor exam: Thin bulk, global strength of about 4 out of 5, she has an increased tone in both upper extremities, particularly on the right side.  She has an intermittent resting tremor in the right upper extremity, minimal in the left upper extremity.  She has no lower extremity tremor.   Fine motor skills and coordination: Moderately impaired globally.  Moderate bradykinesia noted.  No dyskinesias noted.    Cerebellar testing: No dysmetria or intention tremor. There is no truncal or gait ataxia.  Sensory exam: intact to light touch in the upper and lower extremities.  Gait, station and balance: She stands slowly and with mild difficulty but essentially does not require any assistance.  She walks with a rolling walker, slowly and cautiously.  No shuffling noted.   Assessment and plan:   In summary, Dewanda Fennema Hinks is an 89 year old female with an underlying medical history of aortic aneurysm, arthritis with status post right knee replacement, status post left total hip replacement, left shoulder surgery, atrial  tachycardia, reflux disease, glaucoma, goiter, degenerative neck disease with status post cervical fusion in 2012, hyperlipidemia, overactive bladder, restless leg syndrome, history of temporal arteritis, status post arterial biopsy in 2014, who presents for follow-up consultation of her parkinsonism, associated with memory loss and chronic constipation.   Memory scores have slowly declined over time.  She is currently on rivastigmine  3 mg twice daily and low-dose levodopa .  I would be cautious with increasing her medication due to her low blood  pressure and risk for side effects.  We mutually agreed to continue with her current regimen.  She is advised to increase her water  intake and use her walker at all times.  She is advised to talk to her PCP about reducing and perhaps even coming off of the mirtazapine  due to risk for side effects.  We will proceed with a swallow study.  Below is a summary of my recommendations and our discussion points from today's visit, based on chart review, history and examination. They were given these instructions verbally during the visit in detail and also in writing in the MyChart after visit summary (AVS), which they can access electronically. << I will order a swallow study to investigate your difficulty swallowing and choking episodes.  This is an x-ray, typically done through speech pathology. Talk to your primary care about reducing the Remeron  or even coming off of it as you are very sleepy during the day and Remeron  can make you drowsy and make you more off balance and increase your risk for falls. Use your walker at all times. We will continue with your rivastigmine  at 3 mg twice daily and levodopa  low-dose half a pill twice daily.  I would be cautious with any medication increases because your blood pressure is low and it can get worse with Parkinson's medications and also with memory medications. Please increase your water  intake to about 6 cups of water  per day.  You may not always hydrate well enough. Follow-up in about 6 to 8 months with the nurse practitioner in this clinic.>>    I answered all the questions today and the patient and her daughter Olam were in agreement.  I spent 45 minutes in total face-to-face time and in reviewing records during pre-charting, more than 50% of which was spent in counseling and coordination of care, reviewing test results, reviewing medications and treatment regimen and/or in discussing or reviewing the diagnosis of primary parkinsonism, memory loss, the prognosis  and treatment options. Pertinent laboratory and imaging test results that were available during this visit with the patient were reviewed by me and considered in my medical decision making (see chart for details).      [1]  Allergies Allergen Reactions   Lisinopril  Other (See Comments) and Cough    Very low BP/very tired also   Mold Extract [Trichophyton] Other (See Comments)    Headaches and migraines   Molds & Smuts Other (See Comments)    Headaches and migraines   Sulfa Antibiotics    Codeine Nausea Only   Oxycodone  Nausea Only

## 2024-03-02 ENCOUNTER — Non-Acute Institutional Stay: Payer: Self-pay | Admitting: Adult Health

## 2024-03-02 ENCOUNTER — Encounter: Payer: Self-pay | Admitting: Adult Health

## 2024-03-02 VITALS — BP 108/74 | HR 91 | Temp 97.3°F | Ht 63.0 in | Wt 111.0 lb

## 2024-03-02 DIAGNOSIS — F03A0 Unspecified dementia, mild, without behavioral disturbance, psychotic disturbance, mood disturbance, and anxiety: Secondary | ICD-10-CM

## 2024-03-02 DIAGNOSIS — R141 Gas pain: Secondary | ICD-10-CM | POA: Diagnosis not present

## 2024-03-02 DIAGNOSIS — R143 Flatulence: Secondary | ICD-10-CM | POA: Diagnosis not present

## 2024-03-02 DIAGNOSIS — R1310 Dysphagia, unspecified: Secondary | ICD-10-CM

## 2024-03-02 DIAGNOSIS — I7121 Aneurysm of the ascending aorta, without rupture: Secondary | ICD-10-CM | POA: Diagnosis not present

## 2024-03-02 DIAGNOSIS — R142 Eructation: Secondary | ICD-10-CM

## 2024-03-02 DIAGNOSIS — G20A1 Parkinson's disease without dyskinesia, without mention of fluctuations: Secondary | ICD-10-CM

## 2024-03-02 DIAGNOSIS — E782 Mixed hyperlipidemia: Secondary | ICD-10-CM

## 2024-03-02 MED ORDER — SIMETHICONE 125 MG PO CAPS: 1.0000 | ORAL_CAPSULE | Freq: Every day | ORAL | Status: DC

## 2024-03-02 MED ORDER — SIMETHICONE 125 MG PO CAPS: 1.0000 | ORAL_CAPSULE | Freq: Every day | ORAL | Status: AC

## 2024-03-02 NOTE — Progress Notes (Signed)
 "  Location:  [facility]  POS: Clinic  Provider: Tawni America, ANP   Goals of Care:     12/30/2023    2:10 PM  Advanced Directives  Does Patient Have a Medical Advance Directive? Yes  Type of Advance Directive Out of facility DNR (pink MOST or yellow form)     Chief Complaint  Patient presents with   Follow-up    3 month follow up Patient has has concerns about bein very tired.  Patient would discuss meds that she takes in the afternoon.    HPI:  History of Present Illness Janet Wilson is an 89 year old female who presents with fatigue, here for medical management.   PMH PD, memory loss, dysphagia, glaucoma, hyperlipidemia, tachycardia, aortic aneurysm  Fatigue and sleep disturbances - Increased fatigue without clear cause - Wakes at least once nightly - Remeron  dose decreased about one week ago due to concerns for fatigue.  - Concern that Remeron  dose may be worsening fatigue and sleep quality - Significant psychosocial stress related to partner's health - No recent falls - Cautious with mobility  Gastrointestinal symptoms - Weight fluctuates slightly, currently 111 lb - Good appetite - Bothersome gas, especially after eating meat - Considering use of Gas-X for gas symptoms - Bowel movements every other day with varying consistency - No sensation of constipation  Musculoskeletal symptoms - Arthritis - Intermittent use of knee brace - Previous use of compression hose, not currently using for mild edema.    Aortica aneurysm hx of of Followed by Dr Lucas  HLD On crestor  Lab Results  Component Value Date   [facility] 69 05/30/2023   Neurology ordered a swallow study which is pending due to difficulty swallowing No cough or congestion today   Past Medical History:  Diagnosis Date   Aortic aneurysm    Arthritis    OA BOTH KNEES AND HANDS   Atrial tachycardia    HX OF   GERD (gastroesophageal reflux disease)    Glaucoma    left eye    Goiter    CAUSING COUGH, HOARSINESS AND DRY THROAT   Headache(784.0)    MIGRAINES    Hyperlipidemia    Neuralgia    Overactive bladder    RLS (restless legs syndrome)    Temporal arteritis (HCC)     Past Surgical History:  Procedure Laterality Date   ARTERY BIOPSY Right 07/21/2012   Procedure: BIOPSY TEMPORAL ARTERY;  Surgeon: Sherlean JINNY Laughter, MD;  Location: [facility] OR;  Service: General;  Laterality: Right;   CERVICAL FUSION  2012   Dr. Unice   EYE SURGERY     CATARACT EXTRACTION- BILATERAL   INCONTINENCE SURGERY     JOINT REPLACEMENT     right knee   LEFT SHOULDER SURGERY     RIGHT KNEE ARTHROSCOPY  11/2009     ROBOTIC ASSISTED BILATERAL SALPINGO OOPHERECTOMY Bilateral 12/08/2013   Procedure: ROBOTIC ASSISTED LAPAROSCOPIC BILATERAL SALPINGO OOPHORECTOMY WITH STAGING;  Surgeon: Maurilio Ship, MD;  Location: [facility];  Service: Gynecology;  Laterality: Bilateral;   TOTAL HIP ARTHROPLASTY Left 04/03/2022   Procedure: TOTAL HIP ARTHROPLASTY ANTERIOR APPROACH;  Surgeon: Beverley Evalene BIRCH, MD;  Location: [facility];  Service: Orthopedics;  Laterality: Left;   TOTAL KNEE ARTHROPLASTY  07/04/2011   Procedure: TOTAL KNEE ARTHROPLASTY;  Surgeon: Dempsey LULLA Moan, MD;  Location: [facility];  Service: Orthopedics;  Laterality: Right;    Allergies[1]  Outpatient Encounter Medications as of 03/02/2024  Medication Sig   acetaminophen  (TYLENOL )  325 MG tablet Take 2 tablets (650 mg total) by mouth every 4 (four) hours as needed.   brimonidine (ALPHAGAN) 0.2 % ophthalmic solution Place 1 drop into both eyes 2 (two) times daily.   carbidopa -levodopa  (SINEMET  IR) 25-100 MG tablet Take 0.5 tablets by mouth 2 (two) times daily. -Try to separate Sinemet  from food (especially protein-rich foods like meat, dairy, eggs) by about 30-60 mins - this will help the absorption of the medication. If you have some nausea with the medication, you can take it with some light food like crackers or ginger ale. Take in the morning  and then late afternoon.   Cholecalciferol (VITAMIN D3) 50 MCG (2000 UT) capsule Take 1 capsule (2,000 Units total) by mouth daily.   mirtazapine  (REMERON ) 15 MG tablet TAKE 1 TABLET BY MOUTH EVERYDAY AT BEDTIME (Patient taking differently: Take 7.5 mg by mouth at bedtime.)   polyethylene glycol (MIRALAX  / GLYCOLAX ) 17 g packet Take 17 g by mouth daily.   rivastigmine  (EXELON ) 3 MG capsule Take 1 capsule (3 mg total) by mouth 2 (two) times daily.   rosuvastatin  (CRESTOR ) 10 MG tablet TAKE 1 TABLET BY MOUTH EVERY DAY   No facility-administered encounter medications on file as of 03/02/2024.    Review of Systems:  Review of Systems  Constitutional:  Positive for fatigue. Negative for activity change, appetite change, chills, diaphoresis and fever.  HENT:  Positive for trouble swallowing. Negative for congestion.   Respiratory:  Negative for cough, shortness of breath and wheezing.   Cardiovascular:  Negative for chest pain and leg swelling.  Gastrointestinal:  Negative for abdominal distention, abdominal pain, constipation, diarrhea, nausea and vomiting.       Gas  Genitourinary:  Negative for difficulty urinating, dysuria and urgency.  Musculoskeletal:  Positive for gait problem. Negative for back pain, myalgias and neck pain.  Skin:  Negative for rash.  Neurological:  Negative for dizziness and weakness.       Bradykinesia, dysphonia.   Psychiatric/Behavioral:  Negative for confusion.     Health Maintenance  Topic Date Due   COVID-19 Vaccine (4 - Mixed Product risk 2025-26 season) 06/05/2024   Medicare Annual Wellness (AWV)  12/29/2024   DTaP/Tdap/Td (2 - Tdap) 12/12/2027   Pneumococcal Vaccine: 50+ Years  Completed   Influenza Vaccine  Completed   Bone Density Scan  Completed   Zoster Vaccines- Shingrix  Completed   Meningococcal B Vaccine  Aged Out    Physical Exam: Vitals:   03/02/24 0854  BP: 108/74  Pulse: 91  Temp: (!) 97.3 F (36.3 C)  SpO2: 99%  Weight: 111 lb  (50.3 kg)  Height: 5' 3 (1.6 m)   Body mass index is 19.66 kg/m. Physical Exam Vitals and nursing note reviewed.  Constitutional:      General: She is not in acute distress.    Appearance: Normal appearance. She is not diaphoretic.  HENT:     Head: Normocephalic and atraumatic.     Right Ear: Tympanic membrane normal.     Left Ear: Tympanic membrane normal.     Nose: Nose normal. No congestion.     Mouth/Throat:     Mouth: Mucous membranes are moist.     Pharynx: Oropharynx is clear. No oropharyngeal exudate or posterior oropharyngeal erythema.  Neck:     Thyroid : No thyromegaly.     Vascular: No carotid bruit or JVD.  Cardiovascular:     Rate and Rhythm: Normal rate and regular rhythm.  Heart sounds: Normal heart sounds. No murmur heard. Pulmonary:     Effort: Pulmonary effort is normal. No respiratory distress.     Breath sounds: Normal breath sounds. No stridor.  Abdominal:     General: Bowel sounds are normal. There is no distension.     Palpations: Abdomen is soft.     Tenderness: There is no abdominal tenderness.  Musculoskeletal:     Cervical back: No rigidity. No muscular tenderness.     Right lower leg: Edema (trace) present.     Left lower leg: Edema (trace) present.  Lymphadenopathy:     Cervical: No cervical adenopathy.  Skin:    General: Skin is warm and dry.  Neurological:     General: No focal deficit present.     Mental Status: She is alert and oriented to person, place, and time. Mental status is at baseline.  Psychiatric:        Mood and Affect: Mood normal.    Wt Readings from Last 3 Encounters:  03/02/24 111 lb (50.3 kg)  02/19/24 108 lb (49 kg)  12/30/23 113 lb 6.4 oz (51.4 kg)    Labs reviewed: Basic Metabolic Panel: Recent Labs    05/30/23 0000 11/28/23 0000  NA 142 142  K 4.3 4.1  CL 107 105  CO2 23* 25*  BUN 26* 23*  CREATININE 0.7 0.7  CALCIUM  8.9 9.4  TSH 0.65  --    Liver Function Tests: Recent Labs    05/30/23 0000   AST 25  ALT 27  ALBUMIN 3.4*   No results for input(s): LIPASE, AMYLASE in the last 8760 hours. No results for input(s): AMMONIA in the last 8760 hours. CBC: Recent Labs    05/30/23 0000 06/14/23 0000 11/28/23 0000  WBC 4.0 4.2 4.8  HGB 12.4 12.4 13.9  HCT 37 37 42  PLT 153 142* 168   Lipid Panel: Recent Labs    05/30/23 0000  CHOL 125  HDL 53  LDLCALC 67  TRIG 30*   Lab Results  Component Value Date   HGBA1C 5.4 05/21/2014    Procedures since last visit: No results found.   Assessment & Plan Mild dementia No acute cognitive changes. - Continue current management and monitoring.  Depressive symptoms and insomnia Fatigue and disrupted sleep possibly due to Remeron .  - Discontinued Remeron  to assess impact on fatigue and sleep. - Monitor mood and sleep patterns. - Consider alternative mood stabilizers if symptoms recur.  Chronic flatulence Gas and abdominal discomfort after consuming meats. Symptoms are bothersome. - Prescribed Gas-X (simethicone ) for daily use. - Consider dietary modifications to reduce gas.  PD On sinemet   Followed by neurology  Dysphagia Pending sleep study  Aortic aneurysm 6.3 cm on CT 12/04/23 Followed by Dr Lucas    Mammogram aged out Bone density scheduled      Labs/tests ordered:  * No order type specified * Lipid panel and CBC prior to next apt Next appt:  3 month   Total time :  time greater than 50% of total time spent doing pt counseling and coordination of care         [1]  Allergies Allergen Reactions   Lisinopril  Other (See Comments) and Cough    Very low BP/very tired also   Metoprolol    Mold Extract [Trichophyton] Other (See Comments)    Headaches and migraines   Molds & Smuts Other (See Comments)    Headaches and migraines   Sulfa Antibiotics  Codeine Nausea Only   Oxycodone  Nausea Only   "

## 2024-03-24 ENCOUNTER — Encounter (HOSPITAL_COMMUNITY)

## 2024-04-22 ENCOUNTER — Other Ambulatory Visit (HOSPITAL_BASED_OUTPATIENT_CLINIC_OR_DEPARTMENT_OTHER)

## 2024-06-02 ENCOUNTER — Encounter: Admitting: Internal Medicine

## 2024-08-24 ENCOUNTER — Ambulatory Visit: Admitting: Neurology
